# Patient Record
Sex: Male | Born: 1935 | Race: White | Hispanic: No | Marital: Married | Smoking: Former smoker
Health system: Southern US, Community
[De-identification: ages and names within clinical notes are randomized; demographics above are authoritative.]

## PROBLEM LIST (undated history)

## (undated) DIAGNOSIS — K219 Gastro-esophageal reflux disease without esophagitis: Secondary | ICD-10-CM

## (undated) DIAGNOSIS — M169 Osteoarthritis of hip, unspecified: Secondary | ICD-10-CM

## (undated) DIAGNOSIS — I10 Essential (primary) hypertension: Secondary | ICD-10-CM

## (undated) DIAGNOSIS — E785 Hyperlipidemia, unspecified: Secondary | ICD-10-CM

## (undated) DIAGNOSIS — H3530 Unspecified macular degeneration: Secondary | ICD-10-CM

## (undated) HISTORY — DX: Gastro-esophageal reflux disease without esophagitis: K21.9

## (undated) HISTORY — DX: Hyperlipidemia, unspecified: E78.5

## (undated) HISTORY — DX: Essential (primary) hypertension: I10

## (undated) HISTORY — DX: Osteoarthritis of hip, unspecified: M16.9

## (undated) HISTORY — DX: Unspecified macular degeneration: H35.30

## (undated) NOTE — *Deleted (*Deleted)
Depression screen [facility] 2/9 06/06/2020 01/02/2020 12/01/2019  Decreased Interest 1 3 0  Down, Depressed, Hopeless 1 3 1   PHQ - 2 Score 2 6 1   Altered sleeping 0 0 0  Tired, decreased energy 0 1 0  Change in appetite 0 0 0  Feeling bad or failure about yourself  0 0 0  Trouble concentrating 0 0 0  Moving slowly or fidgety/restless 0 0 0  Suicidal thoughts 0 1 0  PHQ-9 Score 2 8 1   Difficult doing work/chores - Somewhat difficult Not difficult at all  Some recent data might be hidden

---

## 1986-08-25 HISTORY — PX: LAMINECTOMY: SHX219

## 2002-01-12 ENCOUNTER — Encounter: Payer: Self-pay | Admitting: Internal Medicine

## 2002-08-25 ENCOUNTER — Encounter: Payer: Self-pay | Admitting: Internal Medicine

## 2002-08-25 LAB — CONVERTED CEMR LAB

## 2003-12-11 ENCOUNTER — Ambulatory Visit (HOSPITAL_COMMUNITY): Admission: RE | Admit: 2003-12-11 | Discharge: 2003-12-11 | Payer: Self-pay | Admitting: Internal Medicine

## 2004-07-29 ENCOUNTER — Ambulatory Visit: Payer: Self-pay | Admitting: Internal Medicine

## 2004-08-05 ENCOUNTER — Ambulatory Visit: Payer: Self-pay | Admitting: Internal Medicine

## 2004-12-19 ENCOUNTER — Ambulatory Visit: Payer: Self-pay | Admitting: Internal Medicine

## 2005-01-01 ENCOUNTER — Ambulatory Visit: Payer: Self-pay | Admitting: Internal Medicine

## 2005-01-17 ENCOUNTER — Ambulatory Visit: Payer: Self-pay | Admitting: Internal Medicine

## 2005-03-07 ENCOUNTER — Ambulatory Visit: Payer: Self-pay | Admitting: Internal Medicine

## 2005-05-01 ENCOUNTER — Ambulatory Visit: Payer: Self-pay | Admitting: Internal Medicine

## 2005-05-08 ENCOUNTER — Ambulatory Visit: Payer: Self-pay | Admitting: Internal Medicine

## 2005-06-03 ENCOUNTER — Ambulatory Visit: Payer: Self-pay | Admitting: Gastroenterology

## 2005-06-18 ENCOUNTER — Ambulatory Visit: Payer: Self-pay | Admitting: Gastroenterology

## 2005-06-18 ENCOUNTER — Encounter: Payer: Self-pay | Admitting: Internal Medicine

## 2005-09-08 ENCOUNTER — Ambulatory Visit: Payer: Self-pay | Admitting: Internal Medicine

## 2005-09-29 ENCOUNTER — Ambulatory Visit: Payer: Self-pay | Admitting: Internal Medicine

## 2006-03-10 ENCOUNTER — Ambulatory Visit: Payer: Self-pay | Admitting: Internal Medicine

## 2006-03-24 ENCOUNTER — Ambulatory Visit: Payer: Self-pay | Admitting: Internal Medicine

## 2006-04-20 ENCOUNTER — Ambulatory Visit (HOSPITAL_COMMUNITY): Admission: RE | Admit: 2006-04-20 | Discharge: 2006-04-20 | Payer: Self-pay | Admitting: Ophthalmology

## 2006-06-08 ENCOUNTER — Ambulatory Visit: Payer: Self-pay | Admitting: Internal Medicine

## 2006-09-24 ENCOUNTER — Ambulatory Visit: Payer: Self-pay | Admitting: Internal Medicine

## 2006-09-24 LAB — CONVERTED CEMR LAB
ALT: 15 units/L (ref 0–40)
AST: 23 units/L (ref 0–37)
Albumin: 3.6 g/dL (ref 3.5–5.2)
Alkaline Phosphatase: 59 units/L (ref 39–117)
BUN: 21 mg/dL (ref 6–23)
Bilirubin, Direct: 0.2 mg/dL (ref 0.0–0.3)
CO2: 28 meq/L (ref 19–32)
Calcium: 9 mg/dL (ref 8.4–10.5)
Chloride: 106 meq/L (ref 96–112)
Cholesterol: 177 mg/dL (ref 0–200)
Creatinine, Ser: 1.2 mg/dL (ref 0.4–1.5)
GFR calc Af Amer: 74 mL/min
GFR calc non Af Amer: 61 mL/min
Glucose, Bld: 77 mg/dL (ref 70–99)
HDL: 42.8 mg/dL (ref >39.0)
LDL Cholesterol: 112 mg/dL — ABNORMAL HIGH (ref 0–99)
Potassium: 4.4 meq/L (ref 3.5–5.1)
Sodium: 141 meq/L (ref 135–145)
TSH: 1.85 microintl units/mL (ref 0.35–5.50)
Total Bilirubin: 0.6 mg/dL (ref 0.3–1.2)
Total CHOL/HDL Ratio: 4.1
Total Protein: 6.8 g/dL (ref 6.0–8.3)
Triglycerides: 111 mg/dL (ref 0–149)
VLDL: 22 mg/dL (ref 0–40)

## 2006-10-09 ENCOUNTER — Ambulatory Visit: Payer: Self-pay | Admitting: Internal Medicine

## 2007-01-06 ENCOUNTER — Ambulatory Visit: Payer: Self-pay | Admitting: Internal Medicine

## 2007-01-20 ENCOUNTER — Ambulatory Visit: Payer: Self-pay | Admitting: Internal Medicine

## 2007-02-22 ENCOUNTER — Encounter: Payer: Self-pay | Admitting: Internal Medicine

## 2007-02-22 DIAGNOSIS — K219 Gastro-esophageal reflux disease without esophagitis: Secondary | ICD-10-CM

## 2007-02-22 DIAGNOSIS — E785 Hyperlipidemia, unspecified: Secondary | ICD-10-CM | POA: Insufficient documentation

## 2007-02-22 DIAGNOSIS — H3530 Unspecified macular degeneration: Secondary | ICD-10-CM

## 2007-02-22 HISTORY — DX: Hyperlipidemia, unspecified: E78.5

## 2007-02-22 HISTORY — DX: Gastro-esophageal reflux disease without esophagitis: K21.9

## 2007-02-22 HISTORY — DX: Unspecified macular degeneration: H35.30

## 2007-03-17 ENCOUNTER — Ambulatory Visit: Payer: Self-pay | Admitting: Internal Medicine

## 2007-03-18 LAB — CONVERTED CEMR LAB
ALT: 17 units/L (ref 0–53)
AST: 21 units/L (ref 0–37)
Albumin: 3.9 g/dL (ref 3.5–5.2)
Alkaline Phosphatase: 65 units/L (ref 39–117)
Bilirubin, Direct: 0.1 mg/dL (ref 0.0–0.3)
Cholesterol: 168 mg/dL (ref 0–200)
HDL: 41 mg/dL (ref >39.0)
LDL Cholesterol: 110 mg/dL — ABNORMAL HIGH (ref 0–99)
Total Bilirubin: 0.8 mg/dL (ref 0.3–1.2)
Total CHOL/HDL Ratio: 4.1
Total Protein: 6.8 g/dL (ref 6.0–8.3)
Triglycerides: 83 mg/dL (ref 0–149)
VLDL: 17 mg/dL (ref 0–40)

## 2007-04-29 ENCOUNTER — Telehealth: Payer: Self-pay | Admitting: Internal Medicine

## 2007-06-17 ENCOUNTER — Ambulatory Visit: Payer: Self-pay | Admitting: Internal Medicine

## 2007-06-17 DIAGNOSIS — I10 Essential (primary) hypertension: Secondary | ICD-10-CM

## 2007-06-17 HISTORY — DX: Essential (primary) hypertension: I10

## 2007-06-18 LAB — CONVERTED CEMR LAB
ALT: 13 units/L (ref 0–53)
AST: 19 units/L (ref 0–37)
Albumin: 3.7 g/dL (ref 3.5–5.2)
Alkaline Phosphatase: 59 units/L (ref 39–117)
BUN: 19 mg/dL (ref 6–23)
Bilirubin, Direct: 0.2 mg/dL (ref 0.0–0.3)
CO2: 29 meq/L (ref 19–32)
Calcium: 9 mg/dL (ref 8.4–10.5)
Chloride: 109 meq/L (ref 96–112)
Cholesterol: 158 mg/dL (ref 0–200)
Creatinine, Ser: 1.2 mg/dL (ref 0.4–1.5)
GFR calc Af Amer: 74 mL/min
GFR calc non Af Amer: 61 mL/min
Glucose, Bld: 81 mg/dL (ref 70–99)
HDL: 38.2 mg/dL — ABNORMAL LOW (ref >39.0)
LDL Cholesterol: 98 mg/dL (ref 0–99)
Potassium: 4.6 meq/L (ref 3.5–5.1)
Sodium: 142 meq/L (ref 135–145)
Total Bilirubin: 0.7 mg/dL (ref 0.3–1.2)
Total CHOL/HDL Ratio: 4.1
Total Protein: 6.6 g/dL (ref 6.0–8.3)
Triglycerides: 111 mg/dL (ref 0–149)
VLDL: 22 mg/dL (ref 0–40)

## 2007-10-18 ENCOUNTER — Ambulatory Visit: Payer: Self-pay | Admitting: Internal Medicine

## 2007-11-15 ENCOUNTER — Ambulatory Visit: Payer: Self-pay | Admitting: Internal Medicine

## 2008-04-18 ENCOUNTER — Ambulatory Visit: Payer: Self-pay | Admitting: Internal Medicine

## 2008-05-26 ENCOUNTER — Ambulatory Visit: Payer: Self-pay | Admitting: Internal Medicine

## 2008-05-26 DIAGNOSIS — C4490 Unspecified malignant neoplasm of skin, unspecified: Secondary | ICD-10-CM

## 2008-06-27 ENCOUNTER — Ambulatory Visit: Payer: Self-pay | Admitting: Internal Medicine

## 2008-07-14 ENCOUNTER — Telehealth: Payer: Self-pay | Admitting: *Deleted

## 2008-12-12 ENCOUNTER — Telehealth: Payer: Self-pay | Admitting: Internal Medicine

## 2008-12-21 ENCOUNTER — Ambulatory Visit: Payer: Self-pay | Admitting: Internal Medicine

## 2009-02-05 ENCOUNTER — Ambulatory Visit: Payer: Self-pay | Admitting: Internal Medicine

## 2009-02-07 LAB — CONVERTED CEMR LAB
ALT: 14 units/L (ref 0–53)
Chloride: 112 meq/L (ref 96–112)
GFR calc non Af Amer: 50.91 mL/min (ref >60)
Glucose, Bld: 87 mg/dL (ref 70–99)
HDL: 45.7 mg/dL (ref >39.00)
LDL Cholesterol: 85 mg/dL (ref 0–99)
Potassium: 4.5 meq/L (ref 3.5–5.1)
Sodium: 143 meq/L (ref 135–145)
TSH: 1.63 microintl units/mL (ref 0.35–5.50)
Total Bilirubin: 0.9 mg/dL (ref 0.3–1.2)
Total Protein: 6.5 g/dL (ref 6.0–8.3)
VLDL: 18.4 mg/dL (ref 0.0–40.0)

## 2009-02-14 ENCOUNTER — Telehealth: Payer: Self-pay | Admitting: Internal Medicine

## 2009-03-20 ENCOUNTER — Ambulatory Visit: Payer: Self-pay | Admitting: Internal Medicine

## 2009-04-27 ENCOUNTER — Ambulatory Visit: Payer: Self-pay | Admitting: Internal Medicine

## 2009-06-05 ENCOUNTER — Ambulatory Visit: Payer: Self-pay | Admitting: Internal Medicine

## 2009-06-05 DIAGNOSIS — M169 Osteoarthritis of hip, unspecified: Secondary | ICD-10-CM

## 2009-06-05 DIAGNOSIS — M1990 Unspecified osteoarthritis, unspecified site: Secondary | ICD-10-CM | POA: Insufficient documentation

## 2009-06-05 DIAGNOSIS — M1610 Unilateral primary osteoarthritis, unspecified hip: Secondary | ICD-10-CM

## 2009-06-05 HISTORY — DX: Unilateral primary osteoarthritis, unspecified hip: M16.10

## 2009-06-05 HISTORY — DX: Osteoarthritis of hip, unspecified: M16.9

## 2009-06-08 ENCOUNTER — Encounter: Admission: RE | Admit: 2009-06-08 | Discharge: 2009-06-08 | Payer: Self-pay | Admitting: Internal Medicine

## 2009-06-19 ENCOUNTER — Ambulatory Visit: Payer: Self-pay | Admitting: Internal Medicine

## 2009-07-24 ENCOUNTER — Telehealth: Payer: Self-pay | Admitting: Internal Medicine

## 2009-08-10 ENCOUNTER — Telehealth: Payer: Self-pay | Admitting: Internal Medicine

## 2009-08-13 ENCOUNTER — Ambulatory Visit: Payer: Self-pay | Admitting: Internal Medicine

## 2009-09-14 ENCOUNTER — Ambulatory Visit: Payer: Self-pay | Admitting: Internal Medicine

## 2009-10-12 ENCOUNTER — Ambulatory Visit: Payer: Self-pay | Admitting: Internal Medicine

## 2009-10-16 LAB — CONVERTED CEMR LAB
ALT: 20 units/L (ref 0–53)
AST: 24 units/L (ref 0–37)
Albumin: 3.9 g/dL (ref 3.5–5.2)
CO2: 28 meq/L (ref 19–32)
Calcium: 9.2 mg/dL (ref 8.4–10.5)
GFR calc non Af Amer: 46.94 mL/min (ref >60)
HDL: 54.1 mg/dL (ref >39.00)
Sodium: 139 meq/L (ref 135–145)
TSH: 1.61 microintl units/mL (ref 0.35–5.50)
Triglycerides: 118 mg/dL (ref 0.0–149.0)

## 2010-01-01 ENCOUNTER — Ambulatory Visit: Payer: Self-pay | Admitting: Internal Medicine

## 2010-01-02 ENCOUNTER — Ambulatory Visit: Payer: Self-pay | Admitting: Internal Medicine

## 2010-01-29 ENCOUNTER — Ambulatory Visit: Payer: Self-pay | Admitting: Internal Medicine

## 2010-03-14 ENCOUNTER — Ambulatory Visit: Payer: Self-pay | Admitting: Internal Medicine

## 2010-03-18 ENCOUNTER — Telehealth: Payer: Self-pay | Admitting: Internal Medicine

## 2010-03-19 ENCOUNTER — Encounter: Admission: RE | Admit: 2010-03-19 | Discharge: 2010-03-19 | Payer: Self-pay | Admitting: Internal Medicine

## 2010-03-20 ENCOUNTER — Ambulatory Visit: Payer: Self-pay | Admitting: Internal Medicine

## 2010-04-02 ENCOUNTER — Ambulatory Visit: Payer: Self-pay | Admitting: Internal Medicine

## 2010-05-06 ENCOUNTER — Ambulatory Visit: Payer: Self-pay | Admitting: Internal Medicine

## 2010-05-10 ENCOUNTER — Ambulatory Visit: Payer: Self-pay | Admitting: Family Medicine

## 2010-05-13 ENCOUNTER — Telehealth: Payer: Self-pay | Admitting: Family Medicine

## 2010-05-21 ENCOUNTER — Ambulatory Visit: Payer: Self-pay | Admitting: Internal Medicine

## 2010-06-04 ENCOUNTER — Ambulatory Visit: Payer: Self-pay | Admitting: Internal Medicine

## 2010-06-05 LAB — CONVERTED CEMR LAB
AST: 21 units/L (ref 0–37)
Alkaline Phosphatase: 57 units/L (ref 39–117)
BUN: 34 mg/dL — ABNORMAL HIGH (ref 6–23)
Bilirubin, Direct: 0.1 mg/dL (ref 0.0–0.3)
CO2: 27 meq/L (ref 19–32)
Chloride: 105 meq/L (ref 96–112)
Creatinine, Ser: 1.3 mg/dL (ref 0.4–1.5)
TSH: 0.99 microintl units/mL (ref 0.35–5.50)
Total Bilirubin: 0.7 mg/dL (ref 0.3–1.2)
Total CHOL/HDL Ratio: 4

## 2010-06-06 ENCOUNTER — Encounter: Admission: RE | Admit: 2010-06-06 | Discharge: 2010-06-06 | Payer: Self-pay | Admitting: Internal Medicine

## 2010-08-05 ENCOUNTER — Ambulatory Visit: Payer: Self-pay | Admitting: Internal Medicine

## 2010-08-05 LAB — CONVERTED CEMR LAB: LDL Goal: 100 mg/dL

## 2010-09-23 ENCOUNTER — Ambulatory Visit
Admission: RE | Admit: 2010-09-23 | Discharge: 2010-09-23 | Payer: Self-pay | Source: Home / Self Care | Attending: Internal Medicine | Admitting: Internal Medicine

## 2010-09-26 NOTE — Assessment & Plan Note (Signed)
Summary: SHINGLES? // RS   Vital Signs:  Patient profile:   77 year old male Weight:      179 pounds BMI:     27.32 Temp:     98.3 degrees F oral Pulse rate:   60 / minute Pulse rhythm:   regular Resp:     12 per minute BP sitting:   132 / 76  (left arm) Cuff size:   regular  Vitals Entered By: Gladis Riffle, RN (Jan 01, 2010 9:20 AM) CC: c/o shhingles lingering, is out of cream--also right knee swelling Is Patient Diabetic? No   CC:  c/o shhingles lingering and is out of cream--also right knee swelling.  History of Present Illness: pt concerned with shingles he describes a few bumps on lower legs (bilateral). Seem to come and go and are pruritic. no fever or chills, no sweats  no new contacts---see previous note regarding rash he wonders whether related to BP med changes  All other systems reviewed and were negative   Preventive Screening-Counseling & Management  Alcohol-Tobacco     Smoking Status: current     Year Quit: X 35 YEARS     Cigars/week: one half/week  Current Problems (verified): 1)  Osteoarthritis, Hip, Right  (ICD-715.95) 2)  Carcinoma, Skin, Squamous Cell  (ICD-173.9) 3)  Hypertension  (ICD-401.9) 4)  Macular Degeneration  (ICD-362.50) 5)  Hyperlipidemia  (ICD-272.4) 6)  Gerd  (ICD-530.81)  Current Medications (verified): 1)  Ecotrin 325 Mg Tbec (Aspirin) .... Take 1 Once A Day 2)  Lovastatin 40 Mg Tabs (Lovastatin) .... Take 1 Tablet By Mouth Once A Day 3)  Ocuvite  Tabs (Multiple Vitamins-Minerals) .... Take 1 Tablet By Mouth Once A Day 4)  Fluticasone Propionate 50 Mcg/act  Susp (Fluticasone Propionate) .... 2 Sprays Each Nostril Once Daily 5)  Benazepril-Hydrochlorothiazide 20-25 Mg  Tabs (Benazepril-Hydrochlorothiazide) .[name].. 1 Tab By Mouth Daily 6)  Triamcinolone Acetonide 0.5 % Crea (Triamcinolone Acetonide) .... Apply Two Times A Day To Affected Area 7)  Multivitamins  Tabs (Multiple Vitamin) .... Once Daily  Allergies: 1)  ! Amlodipine  Besylate (Amlodipine Besylate) 2)  Aspirin (Aspirin)  Past History:  Past Medical History: Last updated: 06/17/2007 Allergic rhinitis GERD Hyperlipidemia knee pain macular degeneration  2004 Osteoarthritis Hypertension  Past Surgical History: Last updated: 02/22/2007 laminectomy  1988  Family History: Last updated: 10/18/2007 Family History Breast cancer 1st degree relative <50-daughter  Social History: Last updated: 03/17/2007 Married Former Smoker  Risk Factors: Smoking Status: current (01/01/2010) Cigars/wk: one half/week (01/01/2010)  Physical Exam  General:  Well-developed,well-nourished,in no acute distress; alert,appropriate and cooperative throughout examination Head:  normocephalic and atraumatic.   Eyes:  pupils equal and pupils round.   Ears:  R ear normal and L ear normal.   Neck:  No deformities, masses, or tenderness noted. Chest Wall:  No deformities, masses, tenderness or gynecomastia noted. Lungs:  normal respiratory effort and no intercostal retractions.   Heart:  normal rate and regular rhythm.   Abdomen:  soft and non-tender.   Msk:  No deformity or scoliosis noted of thoracic or lumbar spine.   Extremities:  No clubbing, cyanosis, edema, or deformity noted  Neurologic:  cranial nerves II-XII intact and gait antalgic     Impression & Recommendations:  Problem # 1:  HYPERTENSION (ICD-401.9) unclear whether rash is related to meds will change back to micardis The following medications were removed from the medication list:    Benazepril-hydrochlorothiazide 20-25 Mg Tabs (Benazepril-hydrochlorothiazide) .[name]... 1 tab by mouth  daily His updated medication list for this problem includes:    Micardis 80 Mg Tabs (Telmisartan) .[name]... 1/2  by mouth daily  Problem # 2:  RASH-NONVESICULAR (ICD-782.1) ? related to meds change to Micardis His updated medication list for this problem includes:    Triamcinolone Acetonide 0.5 % Crea (Triamcinolone  acetonide) .[name]... Apply two times a day to affected area  Problem # 3:  OSTEOARTHRITIS, HIP, RIGHT (ICD-715.95) OA knee kne injection later this week His updated medication list for this problem includes:    Ecotrin 325 Mg Tbec (Aspirin) .[name]... Take 1 once a day  Complete Medication List: 1)  Ecotrin 325 Mg Tbec (Aspirin) .... Take 1 once a day 2)  Lovastatin 40 Mg Tabs (Lovastatin) .... Take 1 tablet by mouth once a day 3)  Ocuvite Tabs (Multiple vitamins-minerals) .... Take 1 tablet by mouth once a day 4)  Fluticasone Propionate 50 Mcg/act Susp (Fluticasone propionate) .... 2 sprays each nostril once daily 5)  Triamcinolone Acetonide 0.5 % Crea (Triamcinolone acetonide) .... Apply two times a day to affected area 6)  Multivitamins Tabs (Multiple vitamin) .... Once daily 7)  Micardis 80 Mg Tabs (Telmisartan) .... 1/2  by mouth daily  Patient Instructions: 1)  2 appts: 2)  this week for knee injection 3)  1 month f/u BP

## 2010-09-26 NOTE — Progress Notes (Signed)
Summary: micardis concerns  Phone Note Call from Patient   Caller: Spouse Reason for Call: Talk to Doctor Summary of Call: patient's wife is calling because her husband is taking micardis 80mg .  He is "wobbly" and light-headed.  she would like to know if he should go back to micardis 40mg . Initial call taken by: Kern Reap CMA Duncan Dull),  May 13, 2010 2:13 PM  Follow-up for Phone Call        Have they checked his BP to see if this has dropped too low? Follow-up by: Evelena Peat MD,  May 13, 2010 6:05 PM  Additional Follow-up for Phone Call Additional follow up Details #1::        BPs are running 158/100 and 160/97, but is still dizzy, and unsteady on his feet.  Wife has cut him back to 40 mg. Micardis? Additional Follow-up by: Lynann Beaver CMA,  May 14, 2010 9:49 AM    Additional Follow-up for Phone Call Additional follow up Details #2::    based on his blood pressures, he needs to be on the full tablet dose. Follow-up by: Evelena Peat MD,  May 14, 2010 1:01 PM  Additional Follow-up for Phone Call Additional follow up Details #3:: Details for Additional Follow-up Action Taken: Pta wife called back wanting to know status. Pls call back asap.  [phone]     I spoke with wife, he took 1/2 tab last night, BP was 158/88, no C/O dizziness last night or this AM.  BP at 8:45 was 145/94.  Can they begin taking the Micardis 1/2 tab two times a day? Sid Falcon LPN  May 15, 2010 11:05 AM YES Evelena Peat MD  May 15, 2010 12:57 PM Wife informed and also asked her to keep a log of his BP and how he is feeling.  F/U as needed Sid Falcon LPN  May 15, 2010 5:19 PM   pt wife callback waiting for nurse to return call Heron Sabins  May 15, 2010 3:52 PM  Additional Follow-up by: Lucy Antigua,  May 14, 2010 2:35 PM

## 2010-09-26 NOTE — Assessment & Plan Note (Signed)
Summary: pt will come in fasting/njr   Vital Signs:  Patient profile:   77 year old male Weight:      179 pounds Temp:     98.2 degrees F oral Resp:     16 per minute BP sitting:   158 / 98  Vitals Entered By: Lynann Beaver CMA (June 04, 2010 11:20 AM) CC: rov Is Patient Diabetic? No Pain Assessment Patient in pain? no        CC:  rov.  History of Present Illness:  Follow-Up Visit      This is an 77 year old man who presents for Follow-up visit.  The patient denies chest pain and palpitations.  Since the last visit the patient notes no new problems or concerns.  The patient reports taking meds as prescribed and monitoring BP.  When questioned about possible medication side effects, the patient notes none.  Home BPs 117-157/60-90  he feels well except struggles with orthopedic complaints: knee, hip, back pain. Hip pain is the worst  All other systems reviewed and were negative -swelling of LE resolved with medication change.   Current Problems (verified): 1)  Osteoarthritis, Hip, Right  (ICD-715.95) 2)  Carcinoma, Skin, Squamous Cell  (ICD-173.9) 3)  Hypertension  (ICD-401.9) 4)  Macular Degeneration  (ICD-362.50) 5)  Hyperlipidemia  (ICD-272.4) 6)  Gerd  (ICD-530.81)  Current Medications (verified): 1)  Ecotrin 325 Mg Tbec (Aspirin) .... Take 1 Once A Day 2)  Lovastatin 40 Mg Tabs (Lovastatin) .... Take 1 Tablet By Mouth Once A Day 3)  Ocuvite  Tabs (Multiple Vitamins-Minerals) .... Take 1 Tablet By Mouth Once A Day 4)  Triamcinolone Acetonide 0.5 % Crea (Triamcinolone Acetonide) .... Apply Two Times A Day To Affected Area 5)  Multivitamins  Tabs (Multiple Vitamin) .... Once Daily 6)  Micardis 80 Mg Tabs (Telmisartan) .... 1/2 By Mouth Two Times A Day 7)  Hydrochlorothiazide 25 Mg  Tabs (Hydrochlorothiazide) .... Take 1 Tab By Mouth Every Morning  Allergies (verified): 1)  ! Amlodipine Besylate (Amlodipine Besylate) 2)  Aspirin (Aspirin)  Past History:  Past  Medical History: Last updated: 06/17/2007 Allergic rhinitis GERD Hyperlipidemia knee pain macular degeneration  2004 Osteoarthritis Hypertension  Past Surgical History: Last updated: 02/22/2007 laminectomy  1988  Family History: Last updated: 10/18/2007 Family History Breast cancer 1st degree relative <50-daughter  Social History: Last updated: 03/17/2007 Married Former Smoker  Risk Factors: Smoking Status: current (04/02/2010) Cigars/wk: one half/week (04/02/2010)  Physical Exam  General:  alert and well-developed.   Head:  normocephalic and atraumatic.   Eyes:  pupils equal and pupils round.   Neck:  supple and full ROM.   Chest Wall:  No deformities, masses, tenderness or gynecomastia noted. Lungs:  normal respiratory effort and no intercostal retractions.   Abdomen:  soft and non-tender.   Neurologic:  broad based gait Skin:  turgor normal and color normal.   Psych:  good eye contact and not anxious appearing.     Impression & Recommendations:  Problem # 1:  OSTEOARTHRITIS, HIP, RIGHT (ICD-715.95)  has had injections---still with pain he admits tylenol helps significantly I think he will do better with injection. We'll schedule. His updated medication list for this problem includes:    Ecotrin 325 Mg Tbec (Aspirin) .[name]... Take 1 once a day  Orders: Radiology Referral (Radiology)  Problem # 2:  HYPERTENSION (ICD-401.9)  note home bps repeat bp 148/80 His updated medication list for this problem includes:    Micardis 80 Mg Tabs (  Telmisartan) .[name]... 1/2 by mouth two times a day    Hydrochlorothiazide 25 Mg Tabs (Hydrochlorothiazide) .[name]... Take 1 tab by mouth every morning  BP today: 158/98 Prior BP: 152/98 (05/21/2010)  Prior 10 Yr Risk Heart Disease: 27 % (05/10/2010)  Labs Reviewed: K+: 4.4 (10/12/2009) Creat: : 1.5 (10/12/2009)   Chol: 162 (10/12/2009)   HDL: 54.10 (10/12/2009)   LDL: 84 (10/12/2009)   TG: 118.0 (10/12/2009)  Orders: Specimen  Handling (57846) TLB-BMP (Basic Metabolic Panel-BMET) (80048-METABOL)  Problem # 3:  HYPERLIPIDEMIA (ICD-272.4)  adequate control His updated medication list for this problem includes:    Lovastatin 40 Mg Tabs (Lovastatin) .[name]... Take 1 tablet by mouth once a day  Labs Reviewed: SGOT: 24 (10/12/2009)   SGPT: 20 (10/12/2009)  Prior 10 Yr Risk Heart Disease: 27 % (05/10/2010)   HDL:54.10 (10/12/2009), 45.70 (02/05/2009)  LDL:84 (10/12/2009), 85 (02/05/2009)  Chol:162 (10/12/2009), 149 (02/05/2009)  Trig:118.0 (10/12/2009), 92.0 (02/05/2009)  Orders: Venipuncture (96295) Specimen Handling (28413) TLB-Lipid Panel (80061-LIPID) TLB-Hepatic/Liver Function Pnl (80076-HEPATIC) TLB-TSH (Thyroid Stimulating Hormone) (84443-TSH)  Complete Medication List: 1)  Ecotrin 325 Mg Tbec (Aspirin) .... Take 1 once a day 2)  Lovastatin 40 Mg Tabs (Lovastatin) .... Take 1 tablet by mouth once a day 3)  Ocuvite Tabs (Multiple vitamins-minerals) .... Take 1 tablet by mouth once a day 4)  Triamcinolone Acetonide 0.5 % Crea (Triamcinolone acetonide) .... Apply two times a day to affected area 5)  Multivitamins Tabs (Multiple vitamin) .... Once daily 6)  Micardis 80 Mg Tabs (Telmisartan) .... 1/2 by mouth two times a day 7)  Hydrochlorothiazide 25 Mg Tabs (Hydrochlorothiazide) .... Take 1 tab by mouth every morning  Patient Instructions: 1)  Please schedule a follow-up appointment in 2 months.     Prevention & Chronic Care Immunizations   Influenza vaccine: Fluvax 3+  (05/06/2010)    Tetanus booster: 02/05/2009: Td    Pneumococcal vaccine: Pneumovax (Medicare)  (06/17/2007)    H. zoster vaccine: Not documented  Colorectal Screening   Hemoccult: Not documented    Colonoscopy: done  (06/25/2005)   Colonoscopy action/deferral: not indicated  (04/02/2010)  Other Screening   PSA: done  (08/25/2002)   Smoking status: current  (04/02/2010)  Lipids   Total Cholesterol: 162  (10/12/2009)    LDL: 84  (10/12/2009)   LDL Direct: Not documented   HDL: 54.10  (10/12/2009)   Triglycerides: 118.0  (10/12/2009)    SGOT (AST): 24  (10/12/2009)   SGPT (ALT): 20  (10/12/2009)   Alkaline phosphatase: 51  (10/12/2009)   Total bilirubin: 0.6  (10/12/2009)  Hypertension   Last Blood Pressure: 158 / 98  (06/04/2010)   Serum creatinine: 1.5  (10/12/2009)   Serum potassium 4.4  (10/12/2009)  Self-Management Support :    Hypertension self-management support: Not documented    Lipid self-management support: Not documented

## 2010-09-26 NOTE — Assessment & Plan Note (Signed)
Summary: leg pain//ccm   Vital Signs:  Patient profile:   77 year old male BP sitting:   138 / 80  History of Present Illness: Right hip pain---reviewed previous imaging and injection pain has gradually worsened pain is significant with any walking no swelling All other systems reviewed and were negative   Current Problems (verified): 1)  Knee Pain  (ICD-719.46) 2)  Rash-nonvesicular  (ICD-782.1) 3)  Osteoarthritis, Hip, Right  (ICD-715.95) 4)  Carcinoma, Skin, Squamous Cell  (ICD-173.9) 5)  Hypertension  (ICD-401.9) 6)  Macular Degeneration  (ICD-362.50) 7)  Hyperlipidemia  (ICD-272.4) 8)  Gerd  (ICD-530.81)  Current Medications (verified): 1)  Ecotrin 325 Mg Tbec (Aspirin) .... Take 1 Once A Day 2)  Lovastatin 40 Mg Tabs (Lovastatin) .... Take 1 Tablet By Mouth Once A Day 3)  Ocuvite  Tabs (Multiple Vitamins-Minerals) .... Take 1 Tablet By Mouth Once A Day 4)  Triamcinolone Acetonide 0.5 % Crea (Triamcinolone Acetonide) .... Apply Two Times A Day To Affected Area 5)  Multivitamins  Tabs (Multiple Vitamin) .... Once Daily 6)  Micardis 80 Mg Tabs (Telmisartan) .... 1/2  By Mouth Daily  Allergies (verified): 1)  ! Amlodipine Besylate (Amlodipine Besylate) 2)  Aspirin (Aspirin)  Past History:  Past Medical History: Last updated: 06/17/2007 Allergic rhinitis GERD Hyperlipidemia knee pain macular degeneration  2004 Osteoarthritis Hypertension  Past Surgical History: Last updated: 02/22/2007 laminectomy  1988  Family History: Last updated: 10/18/2007 Family History Breast cancer 1st degree relative <50-daughter  Social History: Last updated: 03/17/2007 Married Former Smoker  Risk Factors: Smoking Status: current (01/29/2010) Cigars/wk: one half/week (01/29/2010)  Physical Exam  General:  alert and well-developed.   Head:  normocephalic and atraumatic.   Eyes:  pupils equal and pupils round.   Neck:  No deformities, masses, or tenderness noted. Chest  Wall:  No deformities, masses, tenderness or gynecomastia noted. Msk:  pain with internal and external rotation of hip   Impression & Recommendations:  Problem # 1:  OSTEOARTHRITIS, HIP, RIGHT (ICD-715.95) will repeat injection as he had great results previously His updated medication list for this problem includes:    Ecotrin 325 Mg Tbec (Aspirin) .[name]... Take 1 once a day  Orders: Radiology Referral (Radiology)  Complete Medication List: 1)  Ecotrin 325 Mg Tbec (Aspirin) .... Take 1 once a day 2)  Lovastatin 40 Mg Tabs (Lovastatin) .... Take 1 tablet by mouth once a day 3)  Ocuvite Tabs (Multiple vitamins-minerals) .... Take 1 tablet by mouth once a day 4)  Triamcinolone Acetonide 0.5 % Crea (Triamcinolone acetonide) .... Apply two times a day to affected area 5)  Multivitamins Tabs (Multiple vitamin) .... Once daily 6)  Micardis 80 Mg Tabs (Telmisartan) .... 1/2  by mouth daily

## 2010-09-26 NOTE — Assessment & Plan Note (Signed)
Summary: 2 month follow up/cjr   Vital Signs:  Patient profile:   77 year old male Weight:      183 pounds Temp:     98.0 degrees F oral Pulse rate:   72 / minute Pulse rhythm:   regular BP sitting:   152 / 88  (left arm) Cuff size:   large  Vitals Entered By: Alfred Levins, CMA (August 05, 2010 10:40 AM) CC: f/u on bp, Lipid Management   CC:  f/u on bp and Lipid Management.  History of Present Illness:  Follow-Up Visit      This is an 77 year old man who presents for Follow-up visit.  The patient denies chest pain and palpitations.  Since the last visit the patient notes no new problems or concerns.  The patient reports taking meds as prescribed and monitoring BP.  When questioned about possible medication side effects, the patient notes none.  Home BPs: 120s/70s  All other systems reviewed and were negative except for usual MSK complaints (knees, hips). Also right hip pain especially with walking. no pain with sitting, lying.     Lipid Management History:      Positive NCEP/ATP III risk factors include male age 70 years old or older, current tobacco user, and hypertension.    Current Medications (verified): 1)  Ecotrin 325 Mg Tbec (Aspirin) .... Take 1 Once A Day 2)  Lovastatin 40 Mg Tabs (Lovastatin) .... Take 1 Tablet By Mouth Once A Day 3)  Micardis 80 Mg Tabs (Telmisartan) .... 1/2 By Mouth Two Times A Day 4)  Hydrochlorothiazide 25 Mg  Tabs (Hydrochlorothiazide) .... Take 1 Tab By Mouth Every Morning  Allergies (verified): 1)  ! Amlodipine Besylate (Amlodipine Besylate) 2)  Aspirin (Aspirin)  Past History:  Past Medical History: Last updated: 06/17/2007 Allergic rhinitis GERD Hyperlipidemia knee pain macular degeneration  2004 Osteoarthritis Hypertension  Past Surgical History: Last updated: 02/22/2007 laminectomy  1988  Family History: Last updated: 10/18/2007 Family History Breast cancer 1st degree relative <50-daughter  Social History: Last  updated: 03/17/2007 Married Former Smoker  Risk Factors: Smoking Status: current (04/02/2010) Cigars/wk: one half/week (04/02/2010)  Physical Exam  General:  well-developed elderly male in no acute distress. He walks with a broad-based gait is somewhat antalgic. HEENT exam atraumatic normocephalic, neck supple. Chest clear to auscultation cardiac exam S1-S2 are regular. Abdominal exam active bowel sounds, soft. Extremities no clubbing or cyanosis. No edema.   Impression & Recommendations:  Problem # 1:  HYPERTENSION (ICD-401.9) home BPs are adequate---coninue meds.  His updated medication list for this problem includes:    Micardis 80 Mg Tabs (Telmisartan) .[name]... 1/2 by mouth two times a day    Hydrochlorothiazide 25 Mg Tabs (Hydrochlorothiazide) .[name]... Take 1 tab by mouth every morning  BP today: 152/88 Prior BP: 158/98 (06/04/2010)  Prior 10 Yr Risk Heart Disease: 27 % (05/10/2010)  Labs Reviewed: K+: 4.7 (06/04/2010) Creat: : 1.3 (06/04/2010)   Chol: 176 (06/04/2010)   HDL: 46.00 (06/04/2010)   LDL: 108 (06/04/2010)   TG: 109.0 (06/04/2010)  Problem # 2:  OSTEOARTHRITIS, HIP, RIGHT (ICD-715.95) reviewed xray I suspect this is the cause of the pain His updated medication list for this problem includes:    Ecotrin 325 Mg Tbec (Aspirin) .[name]... Take 1 once a day  Problem # 3:  HYPERLIPIDEMIA (ICD-272.4)  His updated medication list for this problem includes:    Lovastatin 40 Mg Tabs (Lovastatin) .[name]... Take 1 tablet by mouth once a day  Labs  Reviewed: SGOT: 21 (06/04/2010)   SGPT: 16 (06/04/2010)  Lipid Goals: Chol Goal: 200 (08/05/2010)   HDL Goal: 40 (08/05/2010)   LDL Goal: 100 (08/05/2010)   TG Goal: 150 (08/05/2010)  10 Yr Risk Heart Disease: 40 % Prior 10 Yr Risk Heart Disease: 27 % (05/10/2010)   HDL:46.00 (06/04/2010), 54.10 (10/12/2009)  LDL:108 (06/04/2010), 84 (45/40/9811)  Chol:176 (06/04/2010), 162 (10/12/2009)  Trig:109.0 (06/04/2010), 118.0  (10/12/2009)  Complete Medication List: 1)  Ecotrin 325 Mg Tbec (Aspirin) .... Take 1 once a day 2)  Lovastatin 40 Mg Tabs (Lovastatin) .... Take 1 tablet by mouth once a day 3)  Micardis 80 Mg Tabs (Telmisartan) .... 1/2 by mouth two times a day 4)  Hydrochlorothiazide 25 Mg Tabs (Hydrochlorothiazide) .... Take 1 tab by mouth every morning  Lipid Assessment/Plan:      Based on NCEP/ATP III, the patient's risk factor category is "2 or more risk factors and a calculated 10 year CAD risk of > 20%".  The patient's lipid goals are as follows: Total cholesterol goal is 200; LDL cholesterol goal is 100; HDL cholesterol goal is 40; Triglyceride goal is 150.     Patient Instructions: 1)  see me 6 weeks Prescriptions: MICARDIS 80 MG TABS (TELMISARTAN) 1/2 by mouth two times a day  #30 x 6   Entered and Authorized by:   Birdie Sons MD   Signed by:   Birdie Sons MD on 08/05/2010   Method used:   Electronically to        [facility] [phone]* (retail)       [address]       [place], [place]  [address]       Ph: [phone] or [phone]       Fax: [phone]   RxID:   [id number]    Orders Added: 1)  Est. Patient Level III [38756]

## 2010-09-26 NOTE — Assessment & Plan Note (Signed)
Summary: 1 mo rov/mm   Vital Signs:  Patient profile:   77 year old male Weight:      184 pounds Temp:     97.8 degrees F BP sitting:   144 / 86  Vitals Entered By: Lynann Beaver CMA (September 14, 2009 9:54 AM) CC: Pt is concerned about BP readings. Is Patient Diabetic? No Pain Assessment Patient in pain? no        CC:  Pt is concerned about BP readings..  History of Present Illness:  Follow-Up Visit      This is an 77 year old man who presents for Follow-up visit.  The patient denies chest pain, palpitations, dizziness, syncope, edema, SOB, DOE, PND, and orthopnea.  Since the last visit the patient notes no new problems or concerns.  The patient reports taking meds as prescribed and monitoring BP.  When questioned about possible medication side effects, the patient notes none.    All other systems reviewed and were negative   Current Problems (verified): 1)  Osteoarthritis, Hip, Right  (ICD-715.95) 2)  Carcinoma, Skin, Squamous Cell  (ICD-173.9) 3)  Hypertension  (ICD-401.9) 4)  Macular Degeneration  (ICD-362.50) 5)  Hyperlipidemia  (ICD-272.4) 6)  Gerd  (ICD-530.81)  Allergies (verified): 1)  ! Amlodipine Besylate (Amlodipine Besylate) 2)  Aspirin (Aspirin)  Past History:  Past Medical History: Last updated: 06/17/2007 Allergic rhinitis GERD Hyperlipidemia knee pain macular degeneration  2004 Osteoarthritis Hypertension  Past Surgical History: Last updated: 02/22/2007 laminectomy  1988  Family History: Last updated: 10/18/2007 Family History Breast cancer 1st degree relative <50-daughter  Social History: Last updated: 03/17/2007 Married Former Smoker  Risk Factors: Smoking Status: current (08/13/2009) Cigars/wk: one half/week (08/13/2009)  Review of Systems       All other systems reviewed and were negative   Physical Exam  General:  Well-developed,well-nourished,in no acute distress; alert,appropriate and cooperative throughout  examination Head:  normocephalic and atraumatic.   Eyes:  pupils equal and pupils round.   Ears:  R ear normal and L ear normal.   Neck:  No deformities, masses, or tenderness noted. Chest Wall:  No deformities, masses, tenderness or gynecomastia noted. Lungs:  Normal respiratory effort, chest expands symmetrically. Lungs are clear to auscultation, no crackles or wheezes. Heart:  Normal rate and regular rhythm. S1 and S2 normal without gallop, murmur, click, rub or other extra sounds. Abdomen:  Bowel sounds positive,abdomen soft and non-tender without masses, organomegaly or hernias noted. Msk:  No deformity or scoliosis noted of thoracic or lumbar spine.   Pulses:  R radial normal and L radial normal.  R radial normal and L radial normal.   Neurologic:  cranial nerves II-XII intact and gait normal.   Skin:  turgor normal and color normal.   Psych:  normally interactive and good eye contact.     Impression & Recommendations:  Problem # 1:  OSTEOARTHRITIS, HIP, RIGHT (ICD-715.95)  clinically stable can consider steroid injection His updated medication list for this problem includes:    Ecotrin 325 Mg Tbec (Aspirin) .[name]... Take 1 once a day  Orders: Prescription Created Electronically [phone])  Problem # 2:  HYPERTENSION (ICD-401.9)  not as well controlled see new meds side effects discussed The following medications were removed from the medication list:    Losartan Potassium 50 Mg Tabs (Losartan potassium) .[name]... Take 1 tablet by mouth once a day His updated medication list for this problem includes:    Benazepril-hydrochlorothiazide 20-25 Mg Tabs (Benazepril-hydrochlorothiazide) .[name]... 1 tab by mouth  daily  BP today: 144/86 Prior BP: 138/80 (08/13/2009)  Labs Reviewed: K+: 4.5 (02/05/2009) Creat: : 1.4 (02/05/2009)   Chol: 149 (02/05/2009)   HDL: 45.70 (02/05/2009)   LDL: 85 (02/05/2009)   TG: 92.0 (02/05/2009)  Orders: Prescription Created Electronically [phone])  Problem  # 3:  HYPERLIPIDEMIA (ICD-272.4)  controlled His updated medication list for this problem includes:    Lovastatin 40 Mg Tabs (Lovastatin) .[name]... Take 1 tablet by mouth once a day  Labs Reviewed: SGOT: 20 (02/05/2009)   SGPT: 14 (02/05/2009)   HDL:45.70 (02/05/2009), 38.2 (06/17/2007)  LDL:85 (02/05/2009), 98 (06/17/2007)  Chol:149 (02/05/2009), 158 (06/17/2007)  Trig:92.0 (02/05/2009), 111 (06/17/2007)  Orders: Prescription Created Electronically [phone])  Complete Medication List: 1)  Ecotrin 325 Mg Tbec (Aspirin) .... Take 1 once a day 2)  Lovastatin 40 Mg Tabs (Lovastatin) .... Take 1 tablet by mouth once a day 3)  Ocuvite Tabs (Multiple vitamins-minerals) .... Take 1 tablet by mouth once a day 4)  Fluticasone Propionate 50 Mcg/act Susp (Fluticasone propionate) .... 2 sprays each nostril once daily 5)  Benazepril-hydrochlorothiazide 20-25 Mg Tabs (Benazepril-hydrochlorothiazide) .[name].. 1 tab by mouth daily 6)  Triamcinolone Acetonide 0.5 % Crea (Triamcinolone acetonide) .... Apply two times a day to affected area  Patient Instructions: 1)  Please schedule a follow-up appointment in 1 month. Prescriptions: TRIAMCINOLONE ACETONIDE 0.5 % CREA (TRIAMCINOLONE ACETONIDE) apply two times a day to affected area  #30 grams x PRN   Entered and Authorized by:   Birdie Sons MD   Signed by:   Birdie Sons MD on 09/14/2009   Method used:   Electronically to        [facility] [phone][facility] (retail)       [address]       [place], [place]  [address]       Ph: [phone] or [phone]       Fax: [phone]   RxID:   [id number] BENAZEPRIL-HYDROCHLOROTHIAZIDE 20-25 MG  TABS (BENAZEPRIL-HYDROCHLOROTHIAZIDE) 1 tab by mouth daily  #90 x 3   Entered and Authorized by:   Birdie Sons MD   Signed by:   Birdie Sons MD on 09/14/2009   Method used:   [facility]ronically to        [facility] [phone][facility] (retail)       [address]       [place], [place]  [address]       Ph: [phone] or [phone]        Fax: [phone]   RxID:   [id number]

## 2010-09-26 NOTE — Assessment & Plan Note (Signed)
Summary: FLU SHOT/cb  Flu Vaccine Consent Questions     Do you have a history of severe allergic reactions to this vaccine? no    Any prior history of allergic reactions to egg and/or gelatin? no    Do you have a sensitivity to the preservative Thimersol? no    Do you have a past history of Guillan-Barre Syndrome? no    Do you currently have an acute febrile illness? no    Have you ever had a severe reaction to latex? no    Vaccine information given and explained to patient? yes    Are you currently pregnant? no    Lot Number:[id number]   Exp Date:02/22/2011   Site Given  Right  Deltoid IM  Nurse Visit   Allergies: 1)  ! Amlodipine Besylate (Amlodipine Besylate) 2)  Aspirin (Aspirin)  Orders Added: 1)  Admin 1st Vaccine [90471] 2)  Flu Vaccine 55yrs + [16109]

## 2010-09-26 NOTE — Assessment & Plan Note (Signed)
Summary: 1 MONTH ROV/NJR   Vital Signs:  Patient profile:   77 year old male Height:      68 inches Weight:      184 pounds Temp:     97.7 degrees F Pulse rate:   76 / minute Pulse rhythm:   irregular BP sitting:   150 / 80  (left arm) Cuff size:   regular  Vitals Entered By: Kern Reap CMA Duncan Dull) (October 12, 2009 9:44 AM)  Reason for Visit folllow up blood pressure and rash on left arm  History of Present Illness:  Follow-Up Visit      This is an 77 year old man who presents for Follow-up visit.  The patient denies chest pain and palpitations.  Since the last visit the patient notes no new problems or concerns (except bilateral arm rash last week---resolved now).  The patient reports taking meds as prescribed and monitoring BP  110-140/60-75).  When questioned about possible medication side effects, the patient notes none.    All other systems reviewed and were negative   Current Problems (verified): 1)  Osteoarthritis, Hip, Right  (ICD-715.95) 2)  Carcinoma, Skin, Squamous Cell  (ICD-173.9) 3)  Hypertension  (ICD-401.9) 4)  Macular Degeneration  (ICD-362.50) 5)  Hyperlipidemia  (ICD-272.4) 6)  Gerd  (ICD-530.81)  Current Medications (verified): 1)  Ecotrin 325 Mg Tbec (Aspirin) .... Take 1 Once A Day 2)  Lovastatin 40 Mg Tabs (Lovastatin) .... Take 1 Tablet By Mouth Once A Day 3)  Ocuvite  Tabs (Multiple Vitamins-Minerals) .... Take 1 Tablet By Mouth Once A Day 4)  Fluticasone Propionate 50 Mcg/act  Susp (Fluticasone Propionate) .... 2 Sprays Each Nostril Once Daily 5)  Benazepril-Hydrochlorothiazide 20-25 Mg  Tabs (Benazepril-Hydrochlorothiazide) .[name].. 1 Tab By Mouth Daily 6)  Triamcinolone Acetonide 0.5 % Crea (Triamcinolone Acetonide) .... Apply Two Times A Day To Affected Area 7)  Multivitamins  Tabs (Multiple Vitamin) .... Once Daily  Allergies: 1)  ! Amlodipine Besylate (Amlodipine Besylate) 2)  Aspirin (Aspirin)  Past History:  Past Medical History: Last  updated: 06/17/2007 Allergic rhinitis GERD Hyperlipidemia knee pain macular degeneration  2004 Osteoarthritis Hypertension  Past Surgical History: Last updated: 02/22/2007 laminectomy  1988  Family History: Last updated: 10/18/2007 Family History Breast cancer 1st degree relative <50-daughter  Social History: Last updated: 03/17/2007 Married Former Smoker  Risk Factors: Smoking Status: current (08/13/2009) Cigars/wk: one half/week (08/13/2009)  Physical Exam  General:  Well-developed,well-nourished,in no acute distress; alert,appropriate and cooperative throughout examination Head:  normocephalic and atraumatic.   Eyes:  pupils equal and pupils round.   Neck:  No deformities, masses, or tenderness noted. Chest Wall:  No deformities, masses, tenderness or gynecomastia noted. Lungs:  Normal respiratory effort, chest expands symmetrically. Lungs are clear to auscultation, no crackles or wheezes. Heart:  normal rate and regular rhythm.   Abdomen:  soft and non-tender.   Msk:  No deformity or scoliosis noted of thoracic or lumbar spine.   Pulses:  R radial normal and L radial normal.   Neurologic:  cranial nerves II-XII intact and broad based gait.   Skin:  turgor normal and color normal.   Psych:  normally interactive and good eye contact.     Impression & Recommendations:  Problem # 1:  HYPERTENSION (ICD-401.9)  good control at home-- continue current medications  His updated medication list for this problem includes:    Benazepril-hydrochlorothiazide 20-25 Mg Tabs (Benazepril-hydrochlorothiazide) .[name]... 1 tab by mouth daily  BP today: 150/80 Prior BP: 144/86 (09/14/2009)  Labs Reviewed: K+: 4.5 (02/05/2009) Creat: : 1.4 (02/05/2009)   Chol: 149 (02/05/2009)   HDL: 45.70 (02/05/2009)   LDL: 85 (02/05/2009)   TG: 92.0 (02/05/2009)  Orders: Venipuncture ([address]109) TLB-BMP (Basic Metabolic Panel-BMET) (80048-METABOL)  Problem # 2:  HYPERLIPIDEMIA  (ICD-272.4)  will need labs His updated medication list for this problem includes:    Lovastatin 40 Mg Tabs (Lovastatin) .[name]... Take 1 tablet by mouth once a day  Labs Reviewed: SGOT: 20 (02/05/2009)   SGPT: 14 (02/05/2009)   HDL:45.70 (02/05/2009), 38.2 (06/17/2007)  LDL:85 (02/05/2009), 98 (06/17/2007)  Chol:149 (02/05/2009), 158 (06/17/2007)  Trig:92.0 (02/05/2009), 111 (06/17/2007)  Orders: TLB-Lipid Panel (80061-LIPID) TLB-Hepatic/Liver Function Pnl (80076-HEPATIC) TLB-TSH (Thyroid Stimulating Hormone) (84443-TSH)  Problem # 3:  GERD (ICD-530.81) no sxs and not on any meds  Complete Medication List: 1)  Ecotrin 325 Mg Tbec (Aspirin) .... Take 1 once a day 2)  Lovastatin 40 Mg Tabs (Lovastatin) .... Take 1 tablet by mouth once a day 3)  Ocuvite Tabs (Multiple vitamins-minerals) .... Take 1 tablet by mouth once a day 4)  Fluticasone Propionate 50 Mcg/act Susp (Fluticasone propionate) .... 2 sprays each nostril once daily 5)  Benazepril-hydrochlorothiazide 20-25 Mg Tabs (Benazepril-hydrochlorothiazide) .[name].. 1 tab by mouth daily 6)  Triamcinolone Acetonide 0.5 % Crea (Triamcinolone acetonide) .... Apply two times a day to affected area 7)  Multivitamins Tabs (Multiple vitamin) .... Once daily  Patient Instructions: 1)  Please schedule a follow-up appointment in 3 months.

## 2010-09-26 NOTE — Assessment & Plan Note (Signed)
Summary: bp issues//ccm   Vital Signs:  Patient profile:   77 year old male Weight:      184 pounds Temp:     98.2 degrees F oral Pulse rate:   78 / minute Pulse rhythm:   regular Resp:     16 per minute BP sitting:   152 / 98  Vitals Entered By: Lynann Beaver CMA (May 21, 2010 9:19 AM) CC: rov Is Patient Diabetic? No Pain Assessment Patient in pain? no        CC:  rov.  History of Present Illness: after starting increased dose of micardis he got out of bed and felt very lightheaded---no syncope. later he changed micardis to 40 mg by mouth two times a day----lightheadedness better but BPs are elevated: 134-163/80-95.  He remembers that benazepril was discontinued for an "itchy foot"--- he thinks that was related to fungal infection  All other systems reviewed and were negative excpet arthritic aches and pain.   Current Medications (verified): 1)  Ecotrin 325 Mg Tbec (Aspirin) .... Take 1 Once A Day 2)  Lovastatin 40 Mg Tabs (Lovastatin) .... Take 1 Tablet By Mouth Once A Day 3)  Ocuvite  Tabs (Multiple Vitamins-Minerals) .... Take 1 Tablet By Mouth Once A Day 4)  Triamcinolone Acetonide 0.5 % Crea (Triamcinolone Acetonide) .... Apply Two Times A Day To Affected Area 5)  Multivitamins  Tabs (Multiple Vitamin) .... Once Daily 6)  Micardis 80 Mg Tabs (Telmisartan) .... One By Mouth Once Daily  Allergies (verified): 1)  ! Amlodipine Besylate (Amlodipine Besylate) 2)  Aspirin (Aspirin)  Past History:  Past Medical History: Last updated: 06/17/2007 Allergic rhinitis GERD Hyperlipidemia knee pain macular degeneration  2004 Osteoarthritis Hypertension  Past Surgical History: Last updated: 02/22/2007 laminectomy  1988  Family History: Last updated: 10/18/2007 Family History Breast cancer 1st degree relative <50-daughter  Social History: Last updated: 03/17/2007 Married Former Smoker  Risk Factors: Smoking Status: current (04/02/2010) Cigars/wk: one  half/week (04/02/2010)  Physical Exam  General:  alert and well-developed.   Nose:  no external deformity and nose piercing noted.   Neck:  supple and full ROM.   Lungs:  normal respiratory effort and no intercostal retractions.   Heart:  normal rate and regular rhythm.   Abdomen:  soft and non-tender.   Extremities:  2+ edema to midcalf bilaterally   Impression & Recommendations:  Problem # 1:  HYPERTENSION (ICD-401.9) controlled continue current medications  The following medications were removed from the medication list:    Micardis 80 Mg Tabs (Telmisartan) ..... One by mouth once daily His updated medication list for this problem includes:    Micardis 80 Mg Tabs (Telmisartan) .[name]... 1/2 by mouth two times a day    Hydrochlorothiazide 25 Mg Tabs (Hydrochlorothiazide) .[name]... Take 1 tab by mouth every morning  BP today: 152/98 Prior BP: 170/92 (05/10/2010)  Prior 10 Yr Risk Heart Disease: 27 % (05/10/2010)  Labs Reviewed: K+: 4.4 (10/12/2009) Creat: : 1.5 (10/12/2009)   Chol: 162 (10/12/2009)   HDL: 54.10 (10/12/2009)   LDL: 84 (10/12/2009)   TG: 118.0 (10/12/2009)  Complete Medication List: 1)  Ecotrin 325 Mg Tbec (Aspirin) .... Take 1 once a day 2)  Lovastatin 40 Mg Tabs (Lovastatin) .... Take 1 tablet by mouth once a day 3)  Ocuvite Tabs (Multiple vitamins-minerals) .... Take 1 tablet by mouth once a day 4)  Triamcinolone Acetonide 0.5 % Crea (Triamcinolone acetonide) .... Apply two times a day to affected area 5)  Multivitamins Tabs (  Multiple vitamin) .... Once daily 6)  Micardis 80 Mg Tabs (Telmisartan) .... 1/2 by mouth two times a day 7)  Hydrochlorothiazide 25 Mg Tabs (Hydrochlorothiazide) .... Take 1 tab by mouth every morning  Patient Instructions: 1)  . Prescriptions: HYDROCHLOROTHIAZIDE 25 MG  TABS (HYDROCHLOROTHIAZIDE) Take 1 tab by mouth every morning  #30 x 3   Entered and Authorized by:   Birdie Sons MD   Signed by:   Birdie Sons MD on 05/21/2010    Method used:   [facility] to        [facility] [phone][facility] (retail)       [address]       [place], [place]  [address]       Ph: [phone] or [phone]       Fax: [phone]   RxID:   [id number]

## 2010-09-26 NOTE — Progress Notes (Signed)
Summary: throid check  Phone Note Call from Patient Call back at Home Phone [phone]   Caller: Spouse Summary of Call: wife is calling to see if the patient can have a thyroid check? Initial call taken by: Kern Reap CMA Duncan Dull),  March 18, 2010 12:59 PM  Follow-up for Phone Call        tsh---780.7 Follow-up by: Birdie Sons MD,  March 19, 2010 8:07 AM  Additional Follow-up for Phone Call Additional follow up Details #1::        appointment made Additional Follow-up by: Kern Reap CMA Duncan Dull),  March 19, 2010 10:44 AM

## 2010-09-26 NOTE — Assessment & Plan Note (Signed)
Summary: knne injection per dr//ccm    History of Present Illness: pain knee duration -years progressive severity " up to 6/10 interferes with daily activity no associatied sxs  All other systems reviewed and were negative   Current Problems (verified): 1)  Rash-nonvesicular  (ICD-782.1) 2)  Osteoarthritis, Hip, Right  (ICD-715.95) 3)  Carcinoma, Skin, Squamous Cell  (ICD-173.9) 4)  Hypertension  (ICD-401.9) 5)  Macular Degeneration  (ICD-362.50) 6)  Hyperlipidemia  (ICD-272.4) 7)  Gerd  (ICD-530.81)  Current Medications (verified): 1)  Ecotrin 325 Mg Tbec (Aspirin) .... Take 1 Once A Day 2)  Lovastatin 40 Mg Tabs (Lovastatin) .... Take 1 Tablet By Mouth Once A Day 3)  Ocuvite  Tabs (Multiple Vitamins-Minerals) .... Take 1 Tablet By Mouth Once A Day 4)  Fluticasone Propionate 50 Mcg/act  Susp (Fluticasone Propionate) .... 2 Sprays Each Nostril Once Daily 5)  Triamcinolone Acetonide 0.5 % Crea (Triamcinolone Acetonide) .... Apply Two Times A Day To Affected Area 6)  Multivitamins  Tabs (Multiple Vitamin) .... Once Daily 7)  Micardis 80 Mg Tabs (Telmisartan) .... 1/2  By Mouth Daily  Allergies: 1)  ! Amlodipine Besylate (Amlodipine Besylate) 2)  Aspirin (Aspirin)  Past History:  Past Medical History: Last updated: 06/17/2007 Allergic rhinitis GERD Hyperlipidemia knee pain macular degeneration  2004 Osteoarthritis Hypertension  Past Surgical History: Last updated: 02/22/2007 laminectomy  1988  Family History: Last updated: 10/18/2007 Family History Breast cancer 1st degree relative <50-daughter  Social History: Last updated: 03/17/2007 Married Former Smoker  Risk Factors: Smoking Status: current (01/01/2010) Cigars/wk: one half/week (01/01/2010)  Physical Exam  General:  Well-developed,well-nourished,in no acute distress; alert,appropriate and cooperative throughout examination Msk:  limited flexion of both knees effusion right knee   Impression &  Recommendations:  Problem # 1:  KNEE PAIN (ICD-719.46)  likely OA of knee discussed options after risks and benefits discussedhe gave verbal consent for infection His updated medication list for this problem includes:    Ecotrin 325 Mg Tbec (Aspirin) .[name]... Take 1 once a day  Orders: Joint Aspirate / Injection, Large (20610) Depo- Medrol 40mg  (J1030)  Complete Medication List: 1)  Ecotrin 325 Mg Tbec (Aspirin) .... Take 1 once a day 2)  Lovastatin 40 Mg Tabs (Lovastatin) .... Take 1 tablet by mouth once a day 3)  Ocuvite Tabs (Multiple vitamins-minerals) .... Take 1 tablet by mouth once a day 4)  Fluticasone Propionate 50 Mcg/act Susp (Fluticasone propionate) .... 2 sprays each nostril once daily 5)  Triamcinolone Acetonide 0.5 % Crea (Triamcinolone acetonide) .... Apply two times a day to affected area 6)  Multivitamins Tabs (Multiple vitamin) .... Once daily 7)  Micardis 80 Mg Tabs (Telmisartan) .... 1/2  by mouth daily

## 2010-09-26 NOTE — Assessment & Plan Note (Signed)
Summary: 2 mo rov/mm   Vital Signs:  Patient profile:   77 year old male Weight:      181 pounds BMI:     27.62 Temp:     98.3 degrees F oral Pulse rate:   62 / minute Pulse rhythm:   irregular Resp:     12 per minute BP sitting:   174 / 88  (left arm) Cuff size:   regular  Vitals Entered By: Gladis Riffle, RN (April 02, 2010 10:56 AM)  Serial Vital Signs/Assessments:  Time      Position  BP       Pulse  Resp  Temp     By                     14/78                          Birdie Sons MD  CC: 2 month rov, lab done Is Patient Diabetic? No Comments bandaid from hip injection took skin off with bandaid   CC:  2 month rov and lab done.  History of Present Illness:  Follow-Up Visit      This is an 77 year old man who presents for Follow-up visit.  The patient denies chest pain and palpitations.  Since the last visit the patient notes no new problems or concerns.  The patient reports taking meds as prescribed.  When questioned about possible medication side effects, the patient notes none.    All other systems reviewed and were negative  had recent steroid injection---hip much better    Preventive Screening-Counseling & Management  Alcohol-Tobacco     Smoking Status: current     Year Quit: X 35 YEARS     Cigars/week: one half/week  Current Problems (verified): 1)  Osteoarthritis, Hip, Right  (ICD-715.95) 2)  Carcinoma, Skin, Squamous Cell  (ICD-173.9) 3)  Hypertension  (ICD-401.9) 4)  Macular Degeneration  (ICD-362.50) 5)  Hyperlipidemia  (ICD-272.4) 6)  Gerd  (ICD-530.81)  Current Medications (verified): 1)  Ecotrin 325 Mg Tbec (Aspirin) .... Take 1 Once A Day 2)  Lovastatin 40 Mg Tabs (Lovastatin) .... Take 1 Tablet By Mouth Once A Day 3)  Ocuvite  Tabs (Multiple Vitamins-Minerals) .... Take 1 Tablet By Mouth Once A Day 4)  Triamcinolone Acetonide 0.5 % Crea (Triamcinolone Acetonide) .... Apply Two Times A Day To Affected Area 5)  Multivitamins  Tabs (Multiple  Vitamin) .... Once Daily 6)  Micardis 80 Mg Tabs (Telmisartan) .... 1/2  By Mouth Daily  Allergies: 1)  ! Amlodipine Besylate (Amlodipine Besylate) 2)  Aspirin (Aspirin)  Past History:  Past Medical History: Last updated: 06/17/2007 Allergic rhinitis GERD Hyperlipidemia knee pain macular degeneration  2004 Osteoarthritis Hypertension  Past Surgical History: Last updated: 02/22/2007 laminectomy  1988  Family History: Last updated: 10/18/2007 Family History Breast cancer 1st degree relative <50-daughter  Social History: Last updated: 03/17/2007 Married Former Smoker  Risk Factors: Smoking Status: current (04/02/2010) Cigars/wk: one half/week (04/02/2010)  Physical Exam  General:  alert and well-developed.   Head:  normocephalic and atraumatic.   Eyes:  pupils equal and pupils round.   Ears:  R ear normal and L ear normal.   Neck:  No deformities, masses, or tenderness noted. Lungs:  normal respiratory effort and no intercostal retractions.   Heart:  normal rate and regular rhythm.   Abdomen:  soft and non-tender.   Msk:  pain with  internal and external rotation of hip Pulses:  R radial normal and L radial normal.   Neurologic:  cranial nerves II-XII intact and gait normal.   Skin:  turgor normal and color normal.     Impression & Recommendations:  Problem # 1:  HYPERTENSION (ICD-401.9) home BPs---130/70s average His updated medication list for this problem includes:    Micardis 80 Mg Tabs (Telmisartan) .[name]... 1/2  by mouth daily  BP today: 174/88 Prior BP: 138/80 (03/14/2010)  Labs Reviewed: K+: 4.4 (10/12/2009) Creat: : 1.5 (10/12/2009)   Chol: 162 (10/12/2009)   HDL: 54.10 (10/12/2009)   LDL: 84 (10/12/2009)   TG: 118.0 (10/12/2009)  Problem # 2:  OSTEOARTHRITIS, HIP, RIGHT (ICD-715.95)  much improved His updated medication list for this problem includes:    Ecotrin 325 Mg Tbec (Aspirin) .[name]... Take 1 once a day  Discussed use of medications,  application of heat or cold, and exercises.   Problem # 3:  GERD (ICD-530.81) no sxs and not on any meds  Complete Medication List: 1)  Ecotrin 325 Mg Tbec (Aspirin) .... Take 1 once a day 2)  Lovastatin 40 Mg Tabs (Lovastatin) .... Take 1 tablet by mouth once a day 3)  Ocuvite Tabs (Multiple vitamins-minerals) .... Take 1 tablet by mouth once a day 4)  Triamcinolone Acetonide 0.5 % Crea (Triamcinolone acetonide) .... Apply two times a day to affected area 5)  Multivitamins Tabs (Multiple vitamin) .... Once daily 6)  Micardis 80 Mg Tabs (Telmisartan) .... 1/2  by mouth daily  Contraindications/Deferment of Procedures/Staging:    Test/Procedure: Colonoscopy    Reason for deferment: not indicated   Patient Instructions: 1)  Please schedule a follow-up appointment in 2 months.

## 2010-09-26 NOTE — Assessment & Plan Note (Signed)
Summary: fup bp//ccm   Vital Signs:  Patient profile:   77 year old male Weight:      182 pounds BMI:     27.77 Temp:     98.4 degrees F oral Pulse rate:   56 / minute Pulse rhythm:   regular Resp:     12 per minute BP sitting:   138 / 84  (left arm) Cuff size:   regular  Vitals Entered By: Gladis Riffle, RN (January 29, 2010 11:19 AM) CC: FU BP--BP 143/87 this AM, states better in afternoon Is Patient Diabetic? No   CC:  FU BP--BP 143/87 this AM and states better in afternoon.  History of Present Illness:  Follow-Up Visit      This is an 77 year old man who presents for Follow-up visit.  The patient denies chest pain and palpitations.  Since the last visit the patient notes no new problems or concerns.  The patient reports taking meds as prescribed.  When questioned about possible medication side effects, the patient notes none.    All other systems reviewed and were negative   Preventive Screening-Counseling & Management  Alcohol-Tobacco     Smoking Status: current     Year Quit: X 35 YEARS     Cigars/week: one half/week  Current Medications (verified): 1)  Ecotrin 325 Mg Tbec (Aspirin) .... Take 1 Once A Day 2)  Lovastatin 40 Mg Tabs (Lovastatin) .... Take 1 Tablet By Mouth Once A Day 3)  Ocuvite  Tabs (Multiple Vitamins-Minerals) .... Take 1 Tablet By Mouth Once A Day 4)  Triamcinolone Acetonide 0.5 % Crea (Triamcinolone Acetonide) .... Apply Two Times A Day To Affected Area 5)  Multivitamins  Tabs (Multiple Vitamin) .... Once Daily 6)  Micardis 80 Mg Tabs (Telmisartan) .... 1/2  By Mouth Daily  Allergies: 1)  ! Amlodipine Besylate (Amlodipine Besylate) 2)  Aspirin (Aspirin)  Past History:  Past Medical History: Last updated: 06/17/2007 Allergic rhinitis GERD Hyperlipidemia knee pain macular degeneration  2004 Osteoarthritis Hypertension  Past Surgical History: Last updated: 02/22/2007 laminectomy  1988  Family History: Last updated: 10/18/2007 Family  History Breast cancer 1st degree relative <50-daughter  Social History: Last updated: 03/17/2007 Married Former Smoker  Risk Factors: Smoking Status: current (01/29/2010) Cigars/wk: one half/week (01/29/2010)  Review of Systems       All other systems reviewed and were negative   Physical Exam  General:  alert and well-developed.   Head:  normocephalic and atraumatic.   Neck:  No deformities, masses, or tenderness noted. Lungs:  normal respiratory effort and no intercostal retractions.   Heart:  normal rate and regular rhythm.   Neurologic:  broad based gait   Impression & Recommendations:  Problem # 1:  HYPERTENSION (ICD-401.9) adequate control continue current medications  His updated medication list for this problem includes:    Micardis 80 Mg Tabs (Telmisartan) .[name]... 1/2  by mouth daily  BP today: 138/84 Prior BP: 132/76 (01/01/2010)  Labs Reviewed: K+: 4.4 (10/12/2009) Creat: : 1.5 (10/12/2009)   Chol: 162 (10/12/2009)   HDL: 54.10 (10/12/2009)   LDL: 84 (10/12/2009)   TG: 118.0 (10/12/2009)  Complete Medication List: 1)  Ecotrin 325 Mg Tbec (Aspirin) .... Take 1 once a day 2)  Lovastatin 40 Mg Tabs (Lovastatin) .... Take 1 tablet by mouth once a day 3)  Ocuvite Tabs (Multiple vitamins-minerals) .... Take 1 tablet by mouth once a day 4)  Triamcinolone Acetonide 0.5 % Crea (Triamcinolone acetonide) .... Apply two times  a day to affected area 5)  Multivitamins Tabs (Multiple vitamin) .... Once daily 6)  Micardis 80 Mg Tabs (Telmisartan) .... 1/2  by mouth daily  Patient Instructions: 1)  Please schedule a follow-up appointment in 2 months. Prescriptions: MICARDIS 80 MG TABS (TELMISARTAN) 1/2  by mouth daily  #30 x 3   Entered and Authorized by:   Birdie Sons MD   Signed by:   Birdie Sons MD on 01/29/2010   Method used:   Electronically to        [facility] [phone]* (retail)       [address]       [place], [place]  [address]       Ph: [phone] or  [phone]       Fax: [phone]   RxID:   [id number] TRIAMCINOLONE ACETONIDE 0.5 % CREA (TRIAMCINOLONE ACETONIDE) apply two times a day to affected area  #30 grams x PRN   Entered and Authorized by:   Birdie Sons MD   Signed by:   Birdie Sons MD on 01/29/2010   Method used:   Electronically to        [facility] [phone]* (retail)       [address]       [place], [place]  [address]       Ph: [phone] or [phone]       Fax: [phone]   RxID:   [id number] LOVASTATIN 40 MG TABS (LOVASTATIN) Take 1 tablet by mouth once a day  #90 x 3   Entered and Authorized by:   Birdie Sons MD   Signed by:   Birdie Sons MD on 01/29/2010   Method used:   Electronically to        [facility] [phone]* (retail)       [address]       [place], [place]  [address]       Ph: [phone] or [phone]       Fax: [phone]   RxID:   [id number]

## 2010-09-26 NOTE — Assessment & Plan Note (Signed)
Summary: elevated BP/dm   Vital Signs:  Patient profile:   77 year old male Weight:      185 pounds Temp:     97.8 degrees F oral BP sitting:   170 / 92  (left arm) Cuff size:   regular  Vitals Entered By: Sid Falcon LPN (May 10, 2010 9:31 AM) CC: Elevated BP, Hypertension Management   History of Present Illness: Here with concerns of increasing blood pressure past month. Systolics around 140-160.  Some mild dizziness but no other symptoms. Takes Micardis 80 mg one half tablet daily.  Has taken Norvasc and lisinopril in past but had side effects.  Hypertension History:      He denies headache, chest pain, palpitations, dyspnea with exertion, orthopnea, visual symptoms, neurologic problems, and syncope.        Positive major cardiovascular risk factors include male age 75 years old or older, hyperlipidemia, hypertension, and current tobacco user.     Allergies: 1)  ! Amlodipine Besylate (Amlodipine Besylate) 2)  Aspirin (Aspirin)  Past History:  Past Medical History: Last updated: 06/17/2007 Allergic rhinitis GERD Hyperlipidemia knee pain macular degeneration  2004 Osteoarthritis Hypertension PMH reviewed for relevance  Review of Systems      See HPI  Physical Exam  General:  Well-developed,well-nourished,in no acute distress; alert,appropriate and cooperative throughout examination Neck:  No deformities, masses, or tenderness noted. Lungs:  Normal respiratory effort, chest expands symmetrically. Lungs are clear to auscultation, no crackles or wheezes. Heart:  normal rate and regular rhythm.   Extremities:  trace nonpitting edema legs.   Impression & Recommendations:  Problem # 1:  HYPERTENSION (ICD-401.9) Assessment Deteriorated titrate Micardis to one whole tablet (80mg ) and f/u with primary in one month. His updated medication list for this problem includes:    Micardis 80 Mg Tabs (Telmisartan) ..... One by mouth once daily  Complete  Medication List: 1)  Ecotrin 325 Mg Tbec (Aspirin) .... Take 1 once a day 2)  Lovastatin 40 Mg Tabs (Lovastatin) .... Take 1 tablet by mouth once a day 3)  Ocuvite Tabs (Multiple vitamins-minerals) .... Take 1 tablet by mouth once a day 4)  Triamcinolone Acetonide 0.5 % Crea (Triamcinolone acetonide) .... Apply two times a day to affected area 5)  Multivitamins Tabs (Multiple vitamin) .... Once daily 6)  Micardis 80 Mg Tabs (Telmisartan) .... One by mouth once daily  Hypertension Assessment/Plan:      The patient's hypertensive risk group is category B: At least one risk factor (excluding diabetes) with no target organ damage.  His calculated 10 year risk of coronary heart disease is 27 %.  Today's blood pressure is 170/92.    Patient Instructions: 1)  Increase Micardis to one whole tablet and keep follow up with Dr Cato Mulligan as scheduled.

## 2010-10-02 NOTE — Assessment & Plan Note (Signed)
Summary: 6 wk rov/njr   Vital Signs:  Patient profile:   77 year old male Weight:      184 pounds Temp:     98.4 degrees F oral BP sitting:   136 / 84  (left arm) Cuff size:   large  Vitals Entered By: Alfred Levins, CMA (September 23, 2010 11:00 AM) CC: rt hip pain   CC:  rt hip pain.  History of Present Illness: last week had real trouble walking---right hip pain feels much better today and almost never has any hip pain at rest.  pain is laterally located, can be intense.  reviewed previous hip injection 06/08/10---pt states it helped some but not tremendously. no radiation of pain no recent trauma.   ROS: "otherwise i feel great".    Current Problems (verified): 1)  Osteoarthritis, Hip, Right  (ICD-715.95) 2)  Carcinoma, Skin, Squamous Cell  (ICD-173.9) 3)  Hypertension  (ICD-401.9) 4)  Macular Degeneration  (ICD-362.50) 5)  Hyperlipidemia  (ICD-272.4) 6)  Gerd  (ICD-530.81)  Current Medications (verified): 1)  Ecotrin 325 Mg Tbec (Aspirin) .... Take 1 Once A Day 2)  Lovastatin 40 Mg Tabs (Lovastatin) .... Take 1 Tablet By Mouth Once A Day 3)  Micardis 80 Mg Tabs (Telmisartan) .... 1/2 By Mouth Two Times A Day 4)  Hydrochlorothiazide 25 Mg  Tabs (Hydrochlorothiazide) .... Take 1 Tab By Mouth Every Morning  Allergies (verified): 1)  ! Amlodipine Besylate (Amlodipine Besylate) 2)  Aspirin (Aspirin)      Physical Exam  General:   elderly male in no acute distress. HEENT exam atraumatic, normocephalic, neck supple. Chest clear to auscultation S1 and S2 are regular. He has decreased range of motion of both hips. Some discomfort with  internal/external rotation of the right hip.   Impression & Recommendations:  Problem # 1:  OSTEOARTHRITIS, HIP, RIGHT (ICD-715.95)  we'll try low-dose Celebrex. side effects discussed. His updated medication list for this problem includes:    Ecotrin 325 Mg Tbec (Aspirin) .[name]... Take 1 once a day    Celebrex 200 Mg Caps (Celecoxib)  .[name]... Take 1 tablet by mouth once a day  Problem # 2:  HYPERTENSION (ICD-401.9) controlled continue current medications  His updated medication list for this problem includes:    Micardis 80 Mg Tabs (Telmisartan) .[name]... 1/2 by mouth two times a day    Hydrochlorothiazide 25 Mg Tabs (Hydrochlorothiazide) .[name]... Take 1 tab by mouth every morning  BP today: 136/84 Prior BP: 152/88 (08/05/2010)  Prior 10 Yr Risk Heart Disease: 40 % (08/05/2010)  Labs Reviewed: K+: 4.7 (06/04/2010) Creat: : 1.3 (06/04/2010)   Chol: 176 (06/04/2010)   HDL: 46.00 (06/04/2010)   LDL: 108 (06/04/2010)   TG: 109.0 (06/04/2010)  Problem # 3:  HYPERLIPIDEMIA (ICD-272.4) controlled continue current medications  His updated medication list for this problem includes:    Lovastatin 40 Mg Tabs (Lovastatin) .[name]... Take 1 tablet by mouth once a day  Labs Reviewed: SGOT: 21 (06/04/2010)   SGPT: 16 (06/04/2010)  Lipid Goals: Chol Goal: 200 (08/05/2010)   HDL Goal: 40 (08/05/2010)   LDL Goal: 100 (08/05/2010)   TG Goal: 150 (08/05/2010)  Prior 10 Yr Risk Heart Disease: 40 % (08/05/2010)   HDL:46.00 (06/04/2010), 54.10 (10/12/2009)  LDL:108 (06/04/2010), 84 (16/05/9603)  Chol:176 (06/04/2010), 162 (10/12/2009)  Trig:109.0 (06/04/2010), 118.0 (10/12/2009)  Complete Medication List: 1)  Ecotrin 325 Mg Tbec (Aspirin) .... Take 1 once a day 2)  Lovastatin 40 Mg Tabs (Lovastatin) .... Take 1 tablet by mouth  once a day 3)  Micardis 80 Mg Tabs (Telmisartan) .... 1/2 by mouth two times a day 4)  Hydrochlorothiazide 25 Mg Tabs (Hydrochlorothiazide) .... Take 1 tab by mouth every morning 5)  Celebrex 200 Mg Caps (Celecoxib) .... Take 1 tablet by mouth once a day  Patient Instructions: 1)  3 weeks   Orders Added: 1)  Est. Patient Level III [[address]]

## 2010-10-16 ENCOUNTER — Encounter: Payer: Self-pay | Admitting: Internal Medicine

## 2010-10-23 ENCOUNTER — Ambulatory Visit (INDEPENDENT_AMBULATORY_CARE_PROVIDER_SITE_OTHER): Payer: PRIVATE HEALTH INSURANCE | Admitting: Internal Medicine

## 2010-10-23 ENCOUNTER — Encounter: Payer: Self-pay | Admitting: Internal Medicine

## 2010-10-23 DIAGNOSIS — M169 Osteoarthritis of hip, unspecified: Secondary | ICD-10-CM

## 2010-10-23 DIAGNOSIS — K219 Gastro-esophageal reflux disease without esophagitis: Secondary | ICD-10-CM

## 2010-10-23 DIAGNOSIS — I10 Essential (primary) hypertension: Secondary | ICD-10-CM

## 2010-10-23 MED ORDER — LOSARTAN POTASSIUM 50 MG PO TABS: 50.0000 mg | ORAL_TABLET | Freq: Every day | ORAL | Status: DC

## 2010-10-23 NOTE — Progress Notes (Signed)
  Subjective:    Patient ID: Roger [name], male    DOB: 10-23-1919, 77 y.o.   MRN: [id number]  HPI  htn---has run out of micardis two days ago (bp today is still well controlled) Hip pain secondary to OA---using cane with good success Lipids---tolerating meds GERD---exacerbated by celebrex--he has discontinued  Past Medical History  Diagnosis Date  . CARCINOMA, SKIN, SQUAMOUS CELL 05/26/2008  . GERD 02/22/2007  . HYPERLIPIDEMIA 02/22/2007  . HYPERTENSION 06/17/2007  . MACULAR DEGENERATION 02/22/2007  . OSTEOARTHRITIS, HIP, RIGHT 06/05/2009   Past Surgical History  Procedure Date  . Laminectomy 1988    reports that he has quit smoking. He does not have any smokeless tobacco history on file. His alcohol and drug histories not on file. family history includes Breast cancer in his daughter.    Allergies  Allergen Reactions  . Amlodipine Besylate     REACTION: swelling of feet  . Aspirin     REACTION: GI upset can tolerate ecotrin     Review of Systems  patient denies chest pain, shortness of breath, orthopnea. Denies lower extremity edema, abdominal pain, change in appetite, change in bowel movements. Patient denies rashes, musculoskeletal complaints. No other specific complaints in a complete review of systems.      Objective:   Physical Exam  well-developed well-nourished male in no acute distress. HEENT exam atraumatic, normocephalic, neck supple without jugular venous distention. Chest clear to auscultation cardiac exam S1-S2 are regular. Abdominal exam with bowel sounds, soft and nontender. Extremities no edema. Neurologic exam is alert with a normal gait.        Assessment & Plan:

## 2010-10-23 NOTE — Assessment & Plan Note (Signed)
Doing well with cane

## 2010-10-23 NOTE — Assessment & Plan Note (Signed)
BP controlled Change meds due to insurance noncverage of micardis---losartan

## 2010-10-23 NOTE — Assessment & Plan Note (Signed)
Controlled and not on any meds currently

## 2010-12-17 ENCOUNTER — Ambulatory Visit (INDEPENDENT_AMBULATORY_CARE_PROVIDER_SITE_OTHER): Payer: PRIVATE HEALTH INSURANCE | Admitting: Internal Medicine

## 2010-12-17 DIAGNOSIS — I10 Essential (primary) hypertension: Secondary | ICD-10-CM

## 2010-12-17 DIAGNOSIS — E785 Hyperlipidemia, unspecified: Secondary | ICD-10-CM

## 2010-12-17 LAB — LIPID PANEL
HDL: 48.9 mg/dL (ref >39.00)
VLDL: 26.8 mg/dL (ref 0.0–40.0)

## 2010-12-17 LAB — HEPATIC FUNCTION PANEL
ALT: 16 U/L (ref 0–53)
Albumin: 4 g/dL (ref 3.5–5.2)
Total Bilirubin: 0.6 mg/dL (ref 0.3–1.2)
Total Protein: 6.8 g/dL (ref 6.0–8.3)

## 2010-12-17 LAB — BASIC METABOLIC PANEL
GFR: 49.87 mL/min — ABNORMAL LOW (ref >60.00)
Glucose, Bld: 80 mg/dL (ref 70–99)
Potassium: 5.5 mEq/L — ABNORMAL HIGH (ref 3.5–5.1)
Sodium: 143 mEq/L (ref 135–145)

## 2010-12-17 MED ORDER — TRAMADOL HCL 50 MG PO TABS: 50.0000 mg | ORAL_TABLET | Freq: Every day | ORAL | Status: DC | PRN

## 2010-12-17 NOTE — Progress Notes (Signed)
  Subjective:    Patient ID: Roger [name], male    DOB: [birth year], 77 y.o.   MRN: [id number]  HPI  F/u htn Tolerating losartan Home bps 120s/60s but occasionally can see bps in the 160s  OA knee--R>L  Past Medical History  Diagnosis Date  . CARCINOMA, SKIN, SQUAMOUS CELL 05/26/2008  . GERD 02/22/2007  . HYPERLIPIDEMIA 02/22/2007  . HYPERTENSION 06/17/2007  . MACULAR DEGENERATION 02/22/2007  . OSTEOARTHRITIS, HIP, RIGHT 06/05/2009   Past Surgical History  Procedure Date  . Laminectomy 1988    reports that he has quit smoking. He does not have any smokeless tobacco history on file. His alcohol and drug histories not on file. family history includes Breast cancer in his daughter. Allergies  Allergen Reactions  . Amlodipine Besylate     REACTION: swelling of feet  . Aspirin     REACTION: GI upset can tolerate ecotrin     Review of Systems  patient denies chest pain, shortness of breath, orthopnea. Denies lower extremity edema, abdominal pain, change in appetite, change in bowel movements. Patient denies rashes, musculoskeletal complaints. No other specific complaints in a complete review of systems.      Objective:   Physical Exam  well-developed well-nourished male in no acute distress. HEENT exam atraumatic, normocephalic, neck supple without jugular venous distention. Chest clear to auscultation cardiac exam S1-S2 are regular. Abdominal exam overweight with bowel sounds, soft and nontender. Extremities no edema. Neurologic exam is alert. Uses a cane for walking.        Assessment & Plan:

## 2010-12-17 NOTE — Assessment & Plan Note (Signed)
i've advised him to take BP meds in the evening---might help a.m. Bps Continue meds

## 2010-12-18 ENCOUNTER — Telehealth: Payer: Self-pay | Admitting: *Deleted

## 2010-12-18 NOTE — Telephone Encounter (Signed)
Dr swords told pt and wife that he was going to send in med for him yesterday while they were here at ov- no meds at pharmacy-wants you to check on it

## 2010-12-19 NOTE — Telephone Encounter (Signed)
Wanting tramadol rx, called in to [facility], pt aware

## 2010-12-20 ENCOUNTER — Other Ambulatory Visit: Payer: Self-pay | Admitting: Internal Medicine

## 2010-12-20 ENCOUNTER — Other Ambulatory Visit (INDEPENDENT_AMBULATORY_CARE_PROVIDER_SITE_OTHER): Payer: PRIVATE HEALTH INSURANCE

## 2010-12-20 DIAGNOSIS — E875 Hyperkalemia: Secondary | ICD-10-CM

## 2010-12-20 LAB — BASIC METABOLIC PANEL
GFR: 50.69 mL/min — ABNORMAL LOW (ref >60.00)
Glucose, Bld: 114 mg/dL — ABNORMAL HIGH (ref 70–99)
Potassium: 4.3 mEq/L (ref 3.5–5.1)
Sodium: 141 mEq/L (ref 135–145)

## 2010-12-23 ENCOUNTER — Other Ambulatory Visit: Payer: Self-pay | Admitting: *Deleted

## 2010-12-23 ENCOUNTER — Other Ambulatory Visit: Payer: PRIVATE HEALTH INSURANCE

## 2010-12-23 DIAGNOSIS — I10 Essential (primary) hypertension: Secondary | ICD-10-CM

## 2010-12-23 MED ORDER — HYDROCHLOROTHIAZIDE 25 MG PO TABS: 25.0000 mg | ORAL_TABLET | Freq: Every day | ORAL | Status: DC

## 2011-01-10 NOTE — Op Note (Signed)
Roger [name], Roger [name]               ACCOUNT NO.:  [id number]   MEDICAL RECORD NO.:  [id number]          PATIENT TYPE:  AMB   LOCATION:  SDS                          FACILITY:  [facility]   PHYSICIAN:  Alford Highland. Rankin, M.D.   DATE OF BIRTH:  11/15/1919   DATE OF PROCEDURE:  04/20/2006  DATE OF DISCHARGE:  04/20/2006                                 OPERATIVE REPORT   PREOPERATIVE DIAGNOSIS:  Epiretinal membrane - left eye, with significant  impairment and __________ visual acuity, left eye.   POSTOPERATIVE DIAGNOSES:  1. Epiretinal membrane - left eye, with significant impairment and      __________ visual acuity, left eye.  2. Retinal hole, left eye, superotemporally.   PROCEDURE:  1. Posterior vitrectomy, membrane peel, left eye - internal limiting      membrane.  2. Focal laser photocoagulation for retinopexy purposes.   SURGEON:  Alford Highland. Rankin, MD   ANESTHESIA:  Local retrobulbar with anesthesia control, left eye.   INDICATIONS FOR PROCEDURE:  The patient is an 77 year old man with vision  loss on the basis of epiretinal membrane, as well as found to have a retinal  hole.  The patient understands this is an attempt to improve the visual  acuity and visual functioning by removing the epiretinal membrane.  He  understands the risks of anesthesia, including the rare occurrence of death.  He also understands the risks to the eye including, but not limited to, from  the condition and attempted surgical repair, hemorrhage,  infection,  scarring, need for another surgery, no change in vision, loss of vision,  progressive disease despite intervention.  Appropriate signed consent was  obtained.  The patient taken to the operating room.   In the operating room, appropriate monitoring was followed by mild sedation.  Marcaine 0.75% was delivered 5 mL retrobulbar followed by an additional 5 mL  laterally in the fashion of modified Darel Hong.  The left periocular area was  sterilely prepped  and draped in the usual sterile fashion.  A lid speculum  applied.  A 25-gauge trocar system was then used to place the infusion.  Superior trocars were applied.  Core vitrectomy was then begun.  Vitreous  skirt trimmed and circumcised 360 degrees.  At this time, 25-gauge membrane  forceps were then used to engage the internal limiting membrane with striae  at 360 over the macular region.  This mobilization occurred without  difficulty.  No complications occurred.   Traction was released.  Typical changes in the retina of the ILM removal  were apparent.  Focal laser photocoagulation placed found an atrophic  retinal hole in the superotemporal quadrant anterior to the equator.  Midperipheral laser photocoagulation was also placed inferiorly, temporally  and superiorly for retinopexy purposes.   At this time, the instruments were removed from the eye.  Vitreous  incarceration in the trocars was absent.  Trocars were removed and the  intraocular pressures maintained.  Infusion removed.  Subconjunctival  Decadron applied.  Sterile patch and Fox shield applied.  The patient taken  to the Short Stay.  He will  be discharged home as an outpatient.      Alford Highland Rankin, M.D.  Electronically Signed     GAR/MEDQ  D:  04/20/2006  T:  04/21/2006  Job:  [id number]

## 2011-01-22 ENCOUNTER — Other Ambulatory Visit: Payer: Self-pay | Admitting: Internal Medicine

## 2011-01-27 ENCOUNTER — Telehealth: Payer: Self-pay | Admitting: *Deleted

## 2011-01-27 NOTE — Telephone Encounter (Signed)
Pt c/o knee swelling since yesterday, appt scheduled to see Dr. Cato Mulligan 01/29/11 @ 11:15, however pt would like to be worked in sooner.

## 2011-01-28 ENCOUNTER — Ambulatory Visit (INDEPENDENT_AMBULATORY_CARE_PROVIDER_SITE_OTHER): Payer: PRIVATE HEALTH INSURANCE | Admitting: Internal Medicine

## 2011-01-28 VITALS — BP 170/94

## 2011-01-28 DIAGNOSIS — IMO0002 Reserved for concepts with insufficient information to code with codable children: Secondary | ICD-10-CM

## 2011-01-28 DIAGNOSIS — M1710 Unilateral primary osteoarthritis, unspecified knee: Secondary | ICD-10-CM

## 2011-01-28 NOTE — Telephone Encounter (Signed)
Have him come in today for joint infection only

## 2011-01-28 NOTE — Telephone Encounter (Signed)
Notified pt. 

## 2011-01-29 ENCOUNTER — Ambulatory Visit: Payer: PRIVATE HEALTH INSURANCE | Admitting: Internal Medicine

## 2011-02-04 MED ORDER — METHYLPREDNISOLONE ACETATE 80 MG/ML IJ SUSP: 80.0000 mg | Freq: Once | INTRAMUSCULAR | Status: DC

## 2011-02-04 NOTE — Progress Notes (Signed)
  Knee Arthrocentesis with Injection Procedure Note  Pre-operative Diagnosis: right knee  Post-operative Diagnosis: same  Indications: Symptom relief from osteoarthritis  Anesthesia: Lidocaine 1% without epinephrine without added sodium bicarbonate  Procedure Details   Verbal consent was obtained for the procedure. The joint was prepped with Betadine and a small wheel of anesthetic was injected into the subcutaneous tissue. A 22 gauge needle was inserted into the superior aspect of the joint from a lateral approach. 0 ml of  fluid was removed from the joint. 1 ml 1% lidocaine and 1 ml of depomedrol was then injected into the joint through the same needle. The needle was removed and the area cleansed and dressed.  Complications:  None; patient tolerated the procedure well.

## 2011-04-05 ENCOUNTER — Other Ambulatory Visit: Payer: Self-pay | Admitting: Internal Medicine

## 2011-05-12 ENCOUNTER — Other Ambulatory Visit: Payer: Self-pay | Admitting: Internal Medicine

## 2011-05-28 ENCOUNTER — Encounter: Payer: Self-pay | Admitting: Internal Medicine

## 2011-05-28 ENCOUNTER — Ambulatory Visit (INDEPENDENT_AMBULATORY_CARE_PROVIDER_SITE_OTHER): Payer: PRIVATE HEALTH INSURANCE | Admitting: Internal Medicine

## 2011-05-28 VITALS — BP 162/98 | Temp 98.0°F | Wt 175.0 lb

## 2011-05-28 DIAGNOSIS — R300 Dysuria: Secondary | ICD-10-CM

## 2011-05-28 DIAGNOSIS — I10 Essential (primary) hypertension: Secondary | ICD-10-CM

## 2011-05-28 LAB — POCT URINALYSIS DIPSTICK
Bilirubin, UA: NEGATIVE
Glucose, UA: NEGATIVE
Leukocytes, UA: NEGATIVE
Nitrite, UA: NEGATIVE

## 2011-05-28 MED ORDER — TOBRAMYCIN-DEXAMETHASONE 0.3-0.1 % OP SUSP: 1.0000 [drp] | Freq: Three times a day (TID) | OPHTHALMIC | Status: AC

## 2011-05-28 NOTE — Assessment & Plan Note (Signed)
Home BPs 130s/70s conitnue same meds

## 2011-05-28 NOTE — Progress Notes (Signed)
  Subjective:    Patient ID: Roger [name], male    DOB: 10/31/1919, 77 y.o.   MRN: [id number]  HPI One week ago---felt poorly--mostly itchy watery eyes and nose. Took claritin much better.   Urination---has urge incontinence for several weeks.  Has chronic OA hips and knees---uses tramadol---"makes me too sleepy". Right hip is the most significant. Not bad enough to have another injection at this time.   Past Medical History  Diagnosis Date  . CARCINOMA, SKIN, SQUAMOUS CELL 05/26/2008  . GERD 02/22/2007  . HYPERLIPIDEMIA 02/22/2007  . HYPERTENSION 06/17/2007  . MACULAR DEGENERATION 02/22/2007  . OSTEOARTHRITIS, HIP, RIGHT 06/05/2009   Past Surgical History  Procedure Date  . Laminectomy 1988    reports that he has quit smoking. He does not have any smokeless tobacco history on file. His alcohol and drug histories not on file. family history includes Breast cancer in his daughter. Allergies  Allergen Reactions  . Amlodipine Besylate     REACTION: swelling of feet  . Aspirin     REACTION: GI upset can tolerate ecotrin      Review of Systems  patient denies chest pain, shortness of breath, orthopnea. Denies lower extremity edema, abdominal pain, change in appetite, change in bowel movements. Patient denies rashes, musculoskeletal complaints. No other specific complaints in a complete review of systems.      Objective:   Physical Exam   well-developed well-nourished male in no acute distress. HEENT exam atraumatic, normocephalic, conjunctival erythema neck supple without jugular venous distention. Chest clear to auscultation cardiac exam S1-S2 are regular. Abdominal exam overweight with bowel sounds, soft and nontender. Extremities  Trace edema    Assessment & Plan:

## 2011-06-04 ENCOUNTER — Telehealth: Payer: Self-pay | Admitting: *Deleted

## 2011-06-04 DIAGNOSIS — M25559 Pain in unspecified hip: Secondary | ICD-10-CM

## 2011-06-04 NOTE — Telephone Encounter (Signed)
Pt was in a lot of pain over the weekend with his rt hip and rt knee.  The tramadol helps but when he walks around the pain is bad.  Pt's wife wants to know if Dr Cato Mulligan would like to go ahead with the orthopedic referral

## 2011-06-05 NOTE — Telephone Encounter (Signed)
Referral order entered

## 2011-06-05 NOTE — Telephone Encounter (Signed)
Refer to ortho.

## 2011-06-09 ENCOUNTER — Telehealth: Payer: Self-pay | Admitting: Internal Medicine

## 2011-06-09 NOTE — Telephone Encounter (Signed)
Asked her to give me bp readings from this weekend 174/105, 159/98.  Dr Kirtland Bouchard was already gone for the day so per Dr Lovell Sheehan keep taking BP med.  When can you see him?  He also needs to discuss his pain meds.  Dr Darrelyn Hillock said he needed to get together with Dr Cato Mulligan to discuss pain medicine.

## 2011-06-09 NOTE — Telephone Encounter (Signed)
Pt is having hip pain and seen Dr Darrelyn Hillock on Saturday.  BP right now is 135/74

## 2011-06-09 NOTE — Telephone Encounter (Signed)
Pt went to Ortho doctor and said that pt needs to get fup with Dr Cato Mulligan asap. Pts bp this wkend 105/105, 74/98. Pt req call back asap from Dr Cato Mulligan nurse.

## 2011-06-09 NOTE — Telephone Encounter (Signed)
Ask patient to hold the hydrochlorothiazide until evaluated by Dr. Cato Mulligan this week or next

## 2011-06-10 NOTE — Telephone Encounter (Signed)
Needs to know when to see you????

## 2011-06-10 NOTE — Telephone Encounter (Signed)
Stay off HCTZ---remove from med list Increase losartan to 100 mg po qd.

## 2011-06-11 ENCOUNTER — Other Ambulatory Visit: Payer: Self-pay | Admitting: *Deleted

## 2011-06-11 MED ORDER — LOSARTAN POTASSIUM 100 MG PO TABS: 100.0000 mg | ORAL_TABLET | Freq: Every day | ORAL | Status: DC

## 2011-06-13 NOTE — Telephone Encounter (Signed)
appt scheduled

## 2011-06-16 ENCOUNTER — Ambulatory Visit (INDEPENDENT_AMBULATORY_CARE_PROVIDER_SITE_OTHER): Payer: PRIVATE HEALTH INSURANCE | Admitting: Internal Medicine

## 2011-06-16 ENCOUNTER — Other Ambulatory Visit: Payer: Self-pay | Admitting: Internal Medicine

## 2011-06-16 ENCOUNTER — Encounter: Payer: Self-pay | Admitting: Internal Medicine

## 2011-06-16 VITALS — BP 138/84 | HR 79

## 2011-06-16 DIAGNOSIS — M169 Osteoarthritis of hip, unspecified: Secondary | ICD-10-CM

## 2011-06-16 DIAGNOSIS — K219 Gastro-esophageal reflux disease without esophagitis: Secondary | ICD-10-CM

## 2011-06-16 DIAGNOSIS — H3530 Unspecified macular degeneration: Secondary | ICD-10-CM

## 2011-06-16 DIAGNOSIS — I10 Essential (primary) hypertension: Secondary | ICD-10-CM

## 2011-06-16 MED ORDER — HYDROCODONE-ACETAMINOPHEN 5-325 MG PO TABS: 0.5000 | ORAL_TABLET | Freq: Four times a day (QID) | ORAL | Status: AC | PRN

## 2011-06-16 NOTE — Progress Notes (Signed)
  Subjective:    Patient ID: Roger [name], male    DOB: [birth year], 77 y.o.   MRN: [id number]  HPI  Right hip pain---followed by dr Darrelyn Hillock.  htn---tolerating meds  Past Medical History  Diagnosis Date  . CARCINOMA, SKIN, SQUAMOUS CELL 05/26/2008  . GERD 02/22/2007  . HYPERLIPIDEMIA 02/22/2007  . HYPERTENSION 06/17/2007  . MACULAR DEGENERATION 02/22/2007  . OSTEOARTHRITIS, HIP, RIGHT 06/05/2009   Past Surgical History  Procedure Date  . Laminectomy 1988    reports that he has quit smoking. He does not have any smokeless tobacco history on file. His alcohol and drug histories not on file. family history includes Breast cancer in his daughter. Allergies  Allergen Reactions  . Amlodipine Besylate     REACTION: swelling of feet  . Aspirin     REACTION: GI upset can tolerate ecotrin     Review of Systems  patient denies chest pain, shortness of breath, orthopnea. Denies lower extremity edema, abdominal pain, change in appetite, change in bowel movements. Patient denies rashes, musculoskeletal complaints. No other specific complaints in a complete review of systems.      Objective:   Physical Exam  well-developed well-nourished male in no acute distress. HEENT exam atraumatic, normocephalic, neck supple without jugular venous distention. Chest clear to auscultation cardiac exam S1-S2 are regular. Abdominal exam overweight with bowel sounds, soft and nontender. Extremities no edema.broad based gait---using cane.        Assessment & Plan:

## 2011-06-19 ENCOUNTER — Ambulatory Visit
Admission: RE | Admit: 2011-06-19 | Discharge: 2011-06-19 | Disposition: A | Discharge status: Home / Self Care | Payer: PRIVATE HEALTH INSURANCE | Source: Ambulatory Visit | Attending: Internal Medicine | Admitting: Internal Medicine

## 2011-06-19 ENCOUNTER — Other Ambulatory Visit: Payer: PRIVATE HEALTH INSURANCE

## 2011-06-19 DIAGNOSIS — M169 Osteoarthritis of hip, unspecified: Secondary | ICD-10-CM

## 2011-06-19 DIAGNOSIS — H3530 Unspecified macular degeneration: Secondary | ICD-10-CM | POA: Insufficient documentation

## 2011-06-19 MED ORDER — IOHEXOL 180 MG/ML  SOLN
1.0000 mL | Freq: Once | INTRAMUSCULAR | Status: AC | PRN
  Administered 2011-06-19: 1 mL via INTRA_ARTICULAR

## 2011-06-19 MED ORDER — METHYLPREDNISOLONE ACETATE 40 MG/ML INJ SUSP (RADIOLOG
120.0000 mg | Freq: Once | INTRAMUSCULAR | Status: AC
  Administered 2011-06-19: 120 mg via INTRA_ARTICULAR

## 2011-06-19 MED ORDER — METHYLPREDNISOLONE ACETATE 40 MG/ML INJ SUSP (RADIOLOG: 120.0000 mg | Freq: Once | INTRAMUSCULAR | Status: DC

## 2011-06-20 NOTE — Assessment & Plan Note (Signed)
Patient struggles with hip and knee pain. He is now 77 years old. I would like to avoid surgical intervention. Will treat with narcotics as needed. Side effects discussed.

## 2011-06-23 ENCOUNTER — Telehealth: Payer: Self-pay | Admitting: *Deleted

## 2011-06-23 MED ORDER — DILTIAZEM HCL ER COATED BEADS 180 MG PO CP24: 180.0000 mg | ORAL_CAPSULE | Freq: Every day | ORAL | Status: DC

## 2011-06-23 NOTE — Telephone Encounter (Signed)
Current Outpatient Prescriptions on File Prior to Visit  Medication Sig Dispense Refill  . aspirin 325 MG EC tablet Take 325 mg by mouth daily.        [name] HYDROcodone-acetaminophen (NORCO) 5-325 MG per tablet Take 0.5-1 tablets by mouth every 6 (six) hours as needed for pain.  40 tablet  1  . losartan (COZAAR) 100 MG tablet Take 1 tablet (100 mg total) by mouth daily.  90 tablet  3  . lovastatin (MEVACOR) 40 MG tablet TAKE 1 TABLET EVERY DAY  90 tablet  3   Current Facility-Administered Medications on File Prior to Visit  Medication Dose Route Frequency Provider Last Rate Last Dose  . methylPREDNISolone acetate (DEPO-MEDROL) injection 80 mg  80 mg Intra-articular Once Judie Petit, MD       Add cardizem 180 mg po qd #30/6

## 2011-06-23 NOTE — Telephone Encounter (Signed)
Pt aware, rx sent in electronically 

## 2011-06-23 NOTE — Telephone Encounter (Signed)
Pt is not having anymore pain in his hip but his blood pressure has been 179/104, 172/100, 179/90, 170/93.  He is taking losartan 100mg  once daily

## 2011-06-24 ENCOUNTER — Telehealth: Payer: Self-pay | Admitting: *Deleted

## 2011-06-24 NOTE — Telephone Encounter (Addendum)
Pt's BP was still running high today, but wife was afraid to give him all the meds.  Assured her that he  should take these meds that Dr. Cato Mulligan prescribed, and give it a few days to work, and try to keep Roger [name] as calm as possible.

## 2011-06-26 NOTE — Telephone Encounter (Signed)
Dr. Cato Mulligan was made aware.

## 2011-07-04 ENCOUNTER — Ambulatory Visit (INDEPENDENT_AMBULATORY_CARE_PROVIDER_SITE_OTHER): Payer: PRIVATE HEALTH INSURANCE | Admitting: Family Medicine

## 2011-07-04 ENCOUNTER — Telehealth: Payer: Self-pay | Admitting: Internal Medicine

## 2011-07-04 ENCOUNTER — Encounter: Payer: Self-pay | Admitting: Family Medicine

## 2011-07-04 DIAGNOSIS — I10 Essential (primary) hypertension: Secondary | ICD-10-CM

## 2011-07-04 DIAGNOSIS — R21 Rash and other nonspecific skin eruption: Secondary | ICD-10-CM

## 2011-07-04 DIAGNOSIS — K5900 Constipation, unspecified: Secondary | ICD-10-CM

## 2011-07-04 MED ORDER — HYDROCHLOROTHIAZIDE 12.5 MG PO CAPS: 12.5000 mg | ORAL_CAPSULE | Freq: Every day | ORAL | Status: DC

## 2011-07-04 NOTE — Progress Notes (Signed)
  Subjective:    Patient ID: Roger [name], male    DOB: Dec 29, 1919, 77 y.o.   MRN: [id number]  HPI  Patient seen for the following issues. He has history of hypertension treated with losartan 100 mg daily. Recent elevated blood pressure and addition of diltiazem 180 mg daily. After starting that medication patient has developed persistent pruritic rash mostly involving the buttocks and legs. Rash tends to start several hours after taking medication. He is convinced this is secondary to diltiazem. No other new medications. Has been on some hydrocodone per orthopedist but rash started after the diltiazem. Previous intolerance with edema with amlodipine.  Patient also relates some recent constipation. Using hydrocodone per orthopedist as above. Recently started a stool softener which does help somewhat. Denies any chest pains. No dyspnea. No dizziness. No headaches  Past Medical History  Diagnosis Date  . CARCINOMA, SKIN, SQUAMOUS CELL 05/26/2008  . GERD 02/22/2007  . HYPERLIPIDEMIA 02/22/2007  . HYPERTENSION 06/17/2007  . MACULAR DEGENERATION 02/22/2007  . OSTEOARTHRITIS, HIP, RIGHT 06/05/2009   Past Surgical History  Procedure Date  . Laminectomy 1988    reports that he has quit smoking. He does not have any smokeless tobacco history on file. His alcohol and drug histories not on file. family history includes Breast cancer in his daughter. Allergies  Allergen Reactions  . Amlodipine Besylate     REACTION: swelling of feet  . Aspirin     REACTION: GI upset can tolerate ecotrin      Review of Systems  Constitutional: Negative for appetite change and unexpected weight change.  Respiratory: Negative for cough and shortness of breath.   Cardiovascular: Negative for chest pain.  Gastrointestinal: Positive for constipation. Negative for nausea, vomiting, abdominal pain and blood in stool.  Skin: Positive for rash.  Neurological: Negative for dizziness, syncope and headaches.    Psychiatric/Behavioral: Negative for confusion.       Objective:   Physical Exam  Constitutional: He appears well-developed and well-nourished.  Cardiovascular: Normal rate and regular rhythm.   Pulmonary/Chest: Effort normal and breath sounds normal. No respiratory distress. He has no wheezes. He has no rales.  Musculoskeletal: He exhibits no edema.  Skin:       No rash evident at this time          Assessment & Plan:  #1 hypertension. Suboptimal control. Possible side effect with diltiazem. Continue losartan. Discontinue diltiazem. Add HCTZ 12.5 mg daily and reassess with primary in one month #2 reported skin rash. No evidence for rash at this time. Possible reaction diltiazem. Discontinuation as above #3 constipation. Probably related to hydrocodone use. Increase fluid intake and stool softener.

## 2011-07-04 NOTE — Patient Instructions (Signed)
Stop Diltiazem.  Continue stool softener. Start HCTZ 12.5 mg once daily.

## 2011-07-04 NOTE — Telephone Encounter (Signed)
Order faxed to [facility] supply, pt aware

## 2011-07-04 NOTE — Telephone Encounter (Signed)
Pt need new rx for walker

## 2011-07-16 ENCOUNTER — Telehealth: Payer: Self-pay | Admitting: Internal Medicine

## 2011-07-16 MED ORDER — HYDROCODONE-ACETAMINOPHEN 5-325 MG PO TABS: 1.0000 | ORAL_TABLET | Freq: Four times a day (QID) | ORAL | Status: DC | PRN

## 2011-07-16 NOTE — Telephone Encounter (Signed)
Pharmacy called requesting refill on Norco. Please contact pharmacy

## 2011-07-16 NOTE — Telephone Encounter (Signed)
rx called in, ok per Dr Swords 

## 2011-08-01 ENCOUNTER — Ambulatory Visit: Payer: PRIVATE HEALTH INSURANCE | Admitting: Family Medicine

## 2011-08-01 ENCOUNTER — Ambulatory Visit (INDEPENDENT_AMBULATORY_CARE_PROVIDER_SITE_OTHER): Payer: PRIVATE HEALTH INSURANCE | Admitting: Family Medicine

## 2011-08-01 ENCOUNTER — Encounter: Payer: Self-pay | Admitting: Family Medicine

## 2011-08-01 DIAGNOSIS — I10 Essential (primary) hypertension: Secondary | ICD-10-CM

## 2011-08-01 DIAGNOSIS — H1045 Other chronic allergic conjunctivitis: Secondary | ICD-10-CM

## 2011-08-01 DIAGNOSIS — M1990 Unspecified osteoarthritis, unspecified site: Secondary | ICD-10-CM

## 2011-08-01 DIAGNOSIS — H1010 Acute atopic conjunctivitis, unspecified eye: Secondary | ICD-10-CM

## 2011-08-01 LAB — BASIC METABOLIC PANEL
BUN: 28 mg/dL — ABNORMAL HIGH (ref 6–23)
Creatinine, Ser: 1.5 mg/dL (ref 0.4–1.5)
GFR: 46.04 mL/min — ABNORMAL LOW (ref >60.00)
Potassium: 4.3 mEq/L (ref 3.5–5.1)

## 2011-08-01 MED ORDER — HYDROCODONE-ACETAMINOPHEN 5-325 MG PO TABS: 1.0000 | ORAL_TABLET | Freq: Four times a day (QID) | ORAL | Status: DC | PRN

## 2011-08-01 NOTE — Progress Notes (Signed)
  Subjective:    Patient ID: Roger [name], male    DOB: Apr 02, 1920, 77 y.o.   MRN: [id number]  HPI  Here for followup hypertension. Recent addition of Hydrochlorothiazide and blood pressures have greatly improved. He had rash which did go away after discontinuation of diltiazem. He had some occasional constipation but improved since last visit. No dizziness. No significant peripheral edema issues.  He has severe osteoarthritis knees and hips. Uses very low dose hydrocodone and needs refills. He is taking a half tablet 3-4 times daily. Also complains of some pruritis eyes intermittently. No discharge. No visual changes.  Reviewed:   Past Medical History  Diagnosis Date  . CARCINOMA, SKIN, SQUAMOUS CELL 05/26/2008  . GERD 02/22/2007  . HYPERLIPIDEMIA 02/22/2007  . HYPERTENSION 06/17/2007  . MACULAR DEGENERATION 02/22/2007  . OSTEOARTHRITIS, HIP, RIGHT 06/05/2009   Past Surgical History  Procedure Date  . Laminectomy 1988    reports that he has quit smoking. He does not have any smokeless tobacco history on file. His alcohol and drug histories not on file. family history includes Breast cancer in his daughter. Allergies  Allergen Reactions  . Amlodipine Besylate     REACTION: swelling of feet  . Aspirin     REACTION: GI upset can tolerate ecotrin      Review of Systems  Constitutional: Negative for fever, chills, appetite change and unexpected weight change.  Respiratory: Negative for cough and shortness of breath.   Cardiovascular: Negative for chest pain, palpitations and leg swelling.  Musculoskeletal: Positive for arthralgias.       Objective:   Physical Exam  Constitutional: He appears well-developed and well-nourished.  Cardiovascular: Normal rate and regular rhythm.   Pulmonary/Chest: Effort normal and breath sounds normal. No respiratory distress. He has no wheezes. He has no rales.  Musculoskeletal: He exhibits no edema.  Neurological: He is alert.          Assessment & Plan:  #1 hypertension improved.  Check BMP on HCTZ #2 skin rash resolved #3 osteoarthritis hips and knees. Refill hydrocodone  #4 allergic conjunctivitis. Try over-the-counter eyedrop such as Visine allergy or Naphcon

## 2011-08-05 NOTE — Progress Notes (Signed)
Quick Note:  Pt wife informed ______ 

## 2011-08-15 ENCOUNTER — Encounter: Payer: Self-pay | Admitting: Family Medicine

## 2011-08-15 ENCOUNTER — Ambulatory Visit (INDEPENDENT_AMBULATORY_CARE_PROVIDER_SITE_OTHER): Payer: PRIVATE HEALTH INSURANCE | Admitting: Family Medicine

## 2011-08-15 VITALS — BP 130/70 | Temp 97.6°F | Wt 173.0 lb

## 2011-08-15 DIAGNOSIS — H109 Unspecified conjunctivitis: Secondary | ICD-10-CM

## 2011-08-15 MED ORDER — TOBRAMYCIN-DEXAMETHASONE 0.3-0.1 % OP SUSP: 1.0000 [drp] | OPHTHALMIC | Status: DC

## 2011-08-15 NOTE — Progress Notes (Signed)
  Subjective:    Patient ID: Roger [name], male    DOB: 1919-11-16, 77 y.o.   MRN: [id number]  HPI  Patient with bilateral eye drainage left greater than right past few days. He had a few weeks of pruritus involving both eyes. He tried Visine eyedrops without much improvement. Yellow to crusted drainage just appeared within the past day. Similar process back in October which responded promptly with TobraDex. No contact use. No recent injury. He has macular degeneration so chronic visual impairment but no recent changes.  Review of Systems  Constitutional: Negative for fever and chills.  Eyes: Positive for discharge, redness and itching. Negative for photophobia, pain and visual disturbance.       Objective:   Physical Exam  Constitutional: He appears well-developed and well-nourished.  Eyes:       Minimal erythema both eyes conjunctiva. Minimal yellowish drainage lower lids bilaterally. Otherwise no acute findings  Cardiovascular: Normal rate and regular rhythm.   Pulmonary/Chest: Effort normal and breath sounds normal. No respiratory distress. He has no wheezes.          Assessment & Plan:  Bilateral conjunctivitis. TobraDex eyedrops 4 times a day and touch base next week if not resolving

## 2011-09-21 ENCOUNTER — Other Ambulatory Visit: Payer: Self-pay | Admitting: Internal Medicine

## 2011-09-25 ENCOUNTER — Other Ambulatory Visit: Payer: Self-pay | Admitting: *Deleted

## 2011-09-25 DIAGNOSIS — M1990 Unspecified osteoarthritis, unspecified site: Secondary | ICD-10-CM

## 2011-09-25 NOTE — Telephone Encounter (Signed)
Last filled 08-01-11, #40 with 11 refill

## 2011-09-26 MED ORDER — HYDROCODONE-ACETAMINOPHEN 5-325 MG PO TABS: 1.0000 | ORAL_TABLET | Freq: Four times a day (QID) | ORAL | Status: DC | PRN

## 2011-09-26 NOTE — Telephone Encounter (Signed)
Pt wife callback. Pt is out of hydrocodone please call [facility]

## 2011-09-26 NOTE — Telephone Encounter (Signed)
rx called in

## 2011-10-30 ENCOUNTER — Encounter: Payer: Self-pay | Admitting: Internal Medicine

## 2011-10-30 ENCOUNTER — Ambulatory Visit (INDEPENDENT_AMBULATORY_CARE_PROVIDER_SITE_OTHER): Payer: PRIVATE HEALTH INSURANCE | Admitting: Internal Medicine

## 2011-10-30 VITALS — BP 142/86 | Temp 98.2°F | Wt 171.0 lb

## 2011-10-30 DIAGNOSIS — M1710 Unilateral primary osteoarthritis, unspecified knee: Secondary | ICD-10-CM

## 2011-10-30 DIAGNOSIS — I10 Essential (primary) hypertension: Secondary | ICD-10-CM

## 2011-10-30 MED ORDER — OXYBUTYNIN CHLORIDE 5 MG PO TABS: 2.5000 mg | ORAL_TABLET | Freq: Every day | ORAL | Status: DC

## 2011-10-30 NOTE — Assessment & Plan Note (Signed)
Reasonable control  Continue meds 

## 2011-10-30 NOTE — Progress Notes (Signed)
Patient ID: Roger [name], male   DOB: May 17, 1920, 78 y.o.   MRN: [id number] Has celebrated 89th birthday  Main complaint is right knee pain---has been told that the only long term relief is TKA  Some urinary incontinence---minimal  htn--tolerating meds  Lipids---on lovastatin  Past Medical History  Diagnosis Date  . CARCINOMA, SKIN, SQUAMOUS CELL 05/26/2008  . GERD 02/22/2007  . HYPERLIPIDEMIA 02/22/2007  . HYPERTENSION 06/17/2007  . MACULAR DEGENERATION 02/22/2007  . OSTEOARTHRITIS, HIP, RIGHT 06/05/2009    History   Social History  . Marital Status: Married    Spouse Name: N/A    Number of Children: N/A  . Years of Education: N/A   Occupational History  . Not on file.   Social History Main Topics  . Smoking status: Former Games developer  . Smokeless tobacco: Not on file  . Alcohol Use:   . Drug Use:   . Sexually Active:    Other Topics Concern  . Not on file   Social History Narrative  . No narrative on file    Past Surgical History  Procedure Date  . Laminectomy 1988    Family History  Problem Relation Age of Onset  . Breast cancer Daughter     Allergies  Allergen Reactions  . Amlodipine Besylate     REACTION: swelling of feet  . Aspirin     REACTION: GI upset can tolerate ecotrin    Current Outpatient Prescriptions on File Prior to Visit  Medication Sig Dispense Refill  . aspirin 325 MG EC tablet Take 325 mg by mouth daily.        . hydrochlorothiazide (MICROZIDE) 12.5 MG capsule Take 1 capsule (12.5 mg total) by mouth daily.  30 capsule  11  . HYDROcodone-acetaminophen (NORCO) 5-325 MG per tablet Take 1 tablet by mouth every 6 (six) hours as needed for pain.  40 tablet  2  . losartan (COZAAR) 100 MG tablet Take 1 tablet (100 mg total) by mouth daily.  90 tablet  3  . lovastatin (MEVACOR) 40 MG tablet TAKE 1 TABLET EVERY DAY  90 tablet  3  . tobramycin-dexamethasone (TOBRADEX) ophthalmic solution Place 1 drop into both eyes every 4 (four) hours while  awake.  5 mL  0     patient denies chest pain, shortness of breath, orthopnea. Denies lower extremity edema, abdominal pain, change in appetite, change in bowel movements. Patient denies rashes, musculoskeletal complaints. No other specific complaints in a complete review of systems.   BP 142/86  Temp(Src) 98.2 F (36.8 C) (Oral)  Wt 171 lb (77.565 kg)  well-developed well-nourished male in no acute distress. HEENT exam atraumatic, normocephalic, neck supple without jugular venous distention. Chest clear to auscultation cardiac exam S1-S2 are regular. Abdominal exam overweight with bowel sounds, soft and nontender. Extremities no edema. Neurologic exam is alert with a normal gait.   Assessment and plan: #1 urinary incontinence we'll try a low-dose oxybutynin.  Hypertension reasonably controlled.  Knee pain discussed at length. He is osteoarthritis of the knee. Symptoms limit his ability to perform activities of daily living. After risks benefits and alternatives to [place] we agreed to proceed with steroid injection. The knee was prepped and draped in sterile fashion. Skin was infiltrated with 1% lidocaine. The knee joint was entered with a 22-gauge needle from medial approach. 15 cc of clear yellow fluid was removed from the joint. 40 mg of Depo-Medrol injected into the joint with 1 cc of 1% lidocaine. Patient tolerated the procedure well.

## 2011-10-31 ENCOUNTER — Ambulatory Visit: Payer: PRIVATE HEALTH INSURANCE | Admitting: Family Medicine

## 2011-10-31 MED ORDER — METHYLPREDNISOLONE ACETATE 40 MG/ML IJ SUSP: 40.0000 mg | Freq: Once | INTRAMUSCULAR | Status: DC

## 2011-10-31 MED ORDER — LIDOCAINE HCL 1 % IJ SOLN: 1.0000 mL | Freq: Once | INTRAMUSCULAR | Status: DC

## 2011-11-18 ENCOUNTER — Telehealth: Payer: Self-pay | Admitting: *Deleted

## 2011-11-18 NOTE — Telephone Encounter (Signed)
pts edema has increased and he currently taking 12.5 mg of HCTZ.  Wants to know if he can increase to 25 mg qd

## 2011-11-19 MED ORDER — FUROSEMIDE 20 MG PO TABS: 20.0000 mg | ORAL_TABLET | Freq: Every day | ORAL | Status: DC

## 2011-11-19 MED ORDER — POTASSIUM CHLORIDE CRYS ER 20 MEQ PO TBCR: 20.0000 meq | EXTENDED_RELEASE_TABLET | Freq: Every day | ORAL | Status: DC

## 2011-11-19 NOTE — Telephone Encounter (Signed)
His main problem is venous insufficiency---try to wear compression stockings. D/c hctz and try furosemide 20 meq po qd with KDUR 20 meq po qd.

## 2011-11-19 NOTE — Telephone Encounter (Signed)
rx sent in electronically, referred pt to [facility] for compression stockings

## 2011-12-15 ENCOUNTER — Other Ambulatory Visit: Payer: Self-pay | Admitting: *Deleted

## 2011-12-15 DIAGNOSIS — M1990 Unspecified osteoarthritis, unspecified site: Secondary | ICD-10-CM

## 2011-12-15 MED ORDER — HYDROCODONE-ACETAMINOPHEN 5-325 MG PO TABS: 1.0000 | ORAL_TABLET | Freq: Four times a day (QID) | ORAL | Status: DC | PRN

## 2012-01-30 ENCOUNTER — Other Ambulatory Visit: Payer: Self-pay | Admitting: Internal Medicine

## 2012-02-17 ENCOUNTER — Encounter: Payer: Self-pay | Admitting: Internal Medicine

## 2012-02-17 ENCOUNTER — Ambulatory Visit (INDEPENDENT_AMBULATORY_CARE_PROVIDER_SITE_OTHER): Payer: PRIVATE HEALTH INSURANCE | Admitting: Internal Medicine

## 2012-02-17 VITALS — BP 144/82 | HR 72 | Temp 97.8°F | Wt 172.0 lb

## 2012-02-17 DIAGNOSIS — M1710 Unilateral primary osteoarthritis, unspecified knee: Secondary | ICD-10-CM

## 2012-02-17 MED ORDER — LIDOCAINE HCL 1 % IJ SOLN: 1.0000 mL | Freq: Once | INTRAMUSCULAR | Status: DC

## 2012-02-17 NOTE — Progress Notes (Signed)
  Knee Arthrocentesis with Injection Procedure Note  Pre-operative Diagnosis: right jknee  Post-operative Diagnosis: same  Indications: Symptom relief from osteoarthritis  Anesthesia: Lidocaine 1% with epinephrine without added sodium bicarbonate  Procedure Details   Verbal consent was obtained for the procedure. The joint was prepped with Betadine and a small wheel of anesthetic was injected into the subcutaneous tissue. A 22 gauge needle was inserted into the superior aspect of the joint from a lateral approach. 0 ml  fluid was removed from the joint. 1% lidocaine and 1 ml of depomedrol was then injected into the joint through the same needle. The needle was removed and the area cleansed and dressed.  Complications:  None; patient tolerated the procedure well.

## 2012-02-19 ENCOUNTER — Other Ambulatory Visit: Payer: Self-pay | Admitting: *Deleted

## 2012-02-19 DIAGNOSIS — M1990 Unspecified osteoarthritis, unspecified site: Secondary | ICD-10-CM

## 2012-02-19 MED ORDER — HYDROCODONE-ACETAMINOPHEN 5-325 MG PO TABS: 1.0000 | ORAL_TABLET | Freq: Four times a day (QID) | ORAL | Status: DC | PRN

## 2012-03-15 ENCOUNTER — Other Ambulatory Visit: Payer: Self-pay | Admitting: Internal Medicine

## 2012-03-16 ENCOUNTER — Other Ambulatory Visit: Payer: Self-pay | Admitting: Internal Medicine

## 2012-03-17 ENCOUNTER — Telehealth: Payer: Self-pay | Admitting: *Deleted

## 2012-03-17 NOTE — Telephone Encounter (Signed)
Pt has no appetite, he is taking potassium 20 meq and it makes his stomach burn x1 mth.  Pt has a refill at pharmacy and wants to know if he should p/u.  Per Dr Cato Mulligan stop the potassium for 1 wk and call back to tell us how he feels, pts wife aware

## 2012-05-04 ENCOUNTER — Other Ambulatory Visit: Payer: Self-pay | Admitting: *Deleted

## 2012-05-04 DIAGNOSIS — M1990 Unspecified osteoarthritis, unspecified site: Secondary | ICD-10-CM

## 2012-05-04 MED ORDER — HYDROCODONE-ACETAMINOPHEN 5-325 MG PO TABS: 1.0000 | ORAL_TABLET | Freq: Four times a day (QID) | ORAL | Status: DC | PRN

## 2012-05-19 ENCOUNTER — Encounter: Payer: Self-pay | Admitting: Internal Medicine

## 2012-05-19 ENCOUNTER — Ambulatory Visit (INDEPENDENT_AMBULATORY_CARE_PROVIDER_SITE_OTHER): Payer: PRIVATE HEALTH INSURANCE | Admitting: Internal Medicine

## 2012-05-19 VITALS — BP 138/80 | HR 76 | Temp 97.9°F | Wt 172.0 lb

## 2012-05-19 DIAGNOSIS — K219 Gastro-esophageal reflux disease without esophagitis: Secondary | ICD-10-CM

## 2012-05-19 DIAGNOSIS — I10 Essential (primary) hypertension: Secondary | ICD-10-CM

## 2012-05-19 DIAGNOSIS — Z23 Encounter for immunization: Secondary | ICD-10-CM

## 2012-05-19 DIAGNOSIS — E785 Hyperlipidemia, unspecified: Secondary | ICD-10-CM

## 2012-05-19 DIAGNOSIS — M1710 Unilateral primary osteoarthritis, unspecified knee: Secondary | ICD-10-CM

## 2012-05-19 DIAGNOSIS — M179 Osteoarthritis of knee, unspecified: Secondary | ICD-10-CM

## 2012-05-19 LAB — LIPID PANEL
Cholesterol: 139 mg/dL (ref 0–200)
HDL: 44.5 mg/dL (ref >39.00)
LDL Cholesterol: 73 mg/dL (ref 0–99)
Triglycerides: 109 mg/dL (ref 0.0–149.0)
VLDL: 21.8 mg/dL (ref 0.0–40.0)

## 2012-05-19 LAB — HEPATIC FUNCTION PANEL
ALT: 13 U/L (ref 0–53)
Albumin: 3.8 g/dL (ref 3.5–5.2)
Bilirubin, Direct: 0 mg/dL (ref 0.0–0.3)
Total Protein: 7.1 g/dL (ref 6.0–8.3)

## 2012-05-19 LAB — CBC WITH DIFFERENTIAL/PLATELET
Basophils Absolute: 0 10*3/uL (ref 0.0–0.1)
Basophils Relative: 0.6 % (ref 0.0–3.0)
Eosinophils Absolute: 0.2 10*3/uL (ref 0.0–0.7)
Lymphocytes Relative: 22.2 % (ref 12.0–46.0)
MCHC: 32.9 g/dL (ref 30.0–36.0)
Monocytes Relative: 8.5 % (ref 3.0–12.0)
Neutrophils Relative %: 64.8 % (ref 43.0–77.0)
RBC: 3.73 Mil/uL — ABNORMAL LOW (ref 4.22–5.81)

## 2012-05-19 LAB — BASIC METABOLIC PANEL
Chloride: 107 mEq/L (ref 96–112)
GFR: 54.08 mL/min — ABNORMAL LOW (ref >60.00)
Potassium: 3.8 mEq/L (ref 3.5–5.1)
Sodium: 141 mEq/L (ref 135–145)

## 2012-05-19 NOTE — Progress Notes (Signed)
Patient ID: Roger [name], male   DOB: June 04, 1920, 78 y.o.   MRN: [id number] htn- tolerating meds  OA knees-- pain is manageable, uses walker for ambulation  Lipids- tolerating meds  Past Medical History  Diagnosis Date  . CARCINOMA, SKIN, SQUAMOUS CELL 05/26/2008  . GERD 02/22/2007  . HYPERLIPIDEMIA 02/22/2007  . HYPERTENSION 06/17/2007  . MACULAR DEGENERATION 02/22/2007  . OSTEOARTHRITIS, HIP, RIGHT 06/05/2009    History   Social History  . Marital Status: Married    Spouse Name: N/A    Number of Children: N/A  . Years of Education: N/A   Occupational History  . Not on file.   Social History Main Topics  . Smoking status: Former Games developer  . Smokeless tobacco: Not on file  . Alcohol Use:   . Drug Use:   . Sexually Active:    Other Topics Concern  . Not on file   Social History Narrative  . No narrative on file    Past Surgical History  Procedure Date  . Laminectomy 1988    Family History  Problem Relation Age of Onset  . Breast cancer Daughter     Allergies  Allergen Reactions  . Amlodipine Besylate     REACTION: swelling of feet  . Aspirin     REACTION: GI upset can tolerate ecotrin    Current Outpatient Prescriptions on File Prior to Visit  Medication Sig Dispense Refill  . aspirin 325 MG EC tablet Take 325 mg by mouth daily.        . furosemide (LASIX) 20 MG tablet TAKE 1 TABLET EVERY DAY  30 tablet  3  . HYDROcodone-acetaminophen (NORCO/VICODIN) 5-325 MG per tablet Take 1 tablet by mouth every 6 (six) hours as needed for pain.  40 tablet  2  . losartan (COZAAR) 100 MG tablet Take 1 tablet (100 mg total) by mouth daily.  90 tablet  3  . lovastatin (MEVACOR) 40 MG tablet TAKE 1 TABLET EVERY DAY  90 tablet  3  . oxybutynin (DITROPAN) 5 MG tablet TAKE 1/2 TABLET EVERY DAY  30 tablet  1  . DISCONTD: diltiazem (CARDIZEM CD) 180 MG 24 hr capsule Take 180 mg by mouth daily.       [name] DISCONTD: hydrochlorothiazide (MICROZIDE) 12.5 MG capsule Take 1 capsule  (12.5 mg total) by mouth daily.  30 capsule  11   Current Facility-Administered Medications on File Prior to Visit  Medication Dose Route Frequency Provider Last Rate Last Dose  . DISCONTD: lidocaine (XYLOCAINE) 1 % (with pres) injection 1 mL  1 mL Intradermal Once Lindley Magnus, MD      . DISCONTD: lidocaine (XYLOCAINE) 1 % (with pres) injection 1 mL  1 mL Intradermal Once Lindley Magnus, MD      . DISCONTD: methylPREDNISolone acetate (DEPO-MEDROL) injection 40 mg  40 mg Intra-articular Once Lindley Magnus, MD         patient denies chest pain, shortness of breath, orthopnea. Denies lower extremity edema, abdominal pain, change in appetite, change in bowel movements. Patient denies rashes, musculoskeletal complaints. No other specific complaints in a complete review of systems.   BP 152/92  Pulse 76  Temp 97.9 F (36.6 C) (Oral)  Wt 172 lb (78.019 kg) elderly male in no acute distress. HEENT exam atraumatic, normocephalic, neck supple without jugular venous distention. Chest clear to auscultation cardiac exam S1-S2 are regular. Abdominal exam overweight with bowel sounds, soft and nontender. Extremities no edema. Neurologic exam is alert.

## 2012-05-23 NOTE — Assessment & Plan Note (Signed)
Long term use of statin-- continue same

## 2012-05-23 NOTE — Assessment & Plan Note (Signed)
BP Readings from Last 3 Encounters:  05/19/12 138/80  02/17/12 144/82  10/30/11 142/86   Reasonable control Continue same meds

## 2012-05-23 NOTE — Assessment & Plan Note (Signed)
Chronic pain- will avoid surgery

## 2012-05-24 DIAGNOSIS — Z23 Encounter for immunization: Secondary | ICD-10-CM

## 2012-06-01 ENCOUNTER — Other Ambulatory Visit: Payer: Self-pay | Admitting: Internal Medicine

## 2012-07-15 ENCOUNTER — Other Ambulatory Visit: Payer: Self-pay | Admitting: Internal Medicine

## 2012-08-05 ENCOUNTER — Ambulatory Visit (INDEPENDENT_AMBULATORY_CARE_PROVIDER_SITE_OTHER): Payer: PRIVATE HEALTH INSURANCE | Admitting: Family Medicine

## 2012-08-05 ENCOUNTER — Encounter: Payer: Self-pay | Admitting: Family Medicine

## 2012-08-05 VITALS — BP 130/80 | Temp 97.2°F | Wt 168.0 lb

## 2012-08-05 DIAGNOSIS — R05 Cough: Secondary | ICD-10-CM

## 2012-08-05 DIAGNOSIS — M1710 Unilateral primary osteoarthritis, unspecified knee: Secondary | ICD-10-CM

## 2012-08-05 MED ORDER — DICLOFENAC SODIUM 1 % TD GEL: 4.0000 g | Freq: Four times a day (QID) | TRANSDERMAL | Status: DC

## 2012-08-05 NOTE — Patient Instructions (Signed)
Try plain mucinex (over the counter)  Drink plenty of fluids Follow up for any fever or increased shortness of breath.

## 2012-08-05 NOTE — Progress Notes (Signed)
  Subjective:    Patient ID: Roger [name], male    DOB: 1919/09/09, 78 y.o.   MRN: [id number]  HPI Seen for the following:  Cough. Duration about one week.  Pt hard of hearing.  Mostly dry.  Occasional clear sputum. No fever or dyspnea.  Thick mucus.  Occ PND.  Has not taken anything OTC for cough. Nonsmoker.  Right knee pain.  NKI.  Hx osteoarthritis.  Injected 6/13 steroids with only brief relief. No recent falls.   Review of Systems  Constitutional: Negative for fever, appetite change and unexpected weight change.  Respiratory: Positive for cough. Negative for shortness of breath and wheezing.   Cardiovascular: Negative for chest pain.  Musculoskeletal: Positive for arthralgias.       Objective:   Physical Exam  Constitutional: He appears well-developed and well-nourished.  HENT:  Mouth/Throat: Oropharynx is clear and moist.  Cardiovascular: Normal rate and regular rhythm.   Pulmonary/Chest: Effort normal and breath sounds normal. No respiratory distress. He has no wheezes. He has no rales.  Musculoskeletal:       Right knee slightly stiff. No effusion. No warmth. No ecchymosis. Mild medial joint line tenderness.          Assessment & Plan:  #1 cough. No fever or other indication of pneumonia. Try over-the-counter Mucinex and plenty of fluids. Followup promptly for any fever #2 right knee pain. Osteoarthritis. Previous steroid injections with minimal relief. Cautious trial of Voltaren gel 3-4 times daily. He does not give any history of aspirin allergy

## 2012-08-10 ENCOUNTER — Other Ambulatory Visit: Payer: Self-pay | Admitting: Internal Medicine

## 2012-09-23 ENCOUNTER — Other Ambulatory Visit: Payer: Self-pay | Admitting: Internal Medicine

## 2012-10-07 ENCOUNTER — Ambulatory Visit: Payer: PRIVATE HEALTH INSURANCE | Admitting: Family Medicine

## 2012-10-11 ENCOUNTER — Encounter: Payer: Self-pay | Admitting: Family Medicine

## 2012-10-11 ENCOUNTER — Ambulatory Visit (INDEPENDENT_AMBULATORY_CARE_PROVIDER_SITE_OTHER): Payer: PRIVATE HEALTH INSURANCE | Admitting: Family Medicine

## 2012-10-11 VITALS — BP 140/82 | Temp 98.0°F | Wt 173.0 lb

## 2012-10-11 DIAGNOSIS — R0982 Postnasal drip: Secondary | ICD-10-CM

## 2012-10-11 DIAGNOSIS — R05 Cough: Secondary | ICD-10-CM

## 2012-10-11 MED ORDER — FLUTICASONE PROPIONATE 50 MCG/ACT NA SUSP: 2.0000 | Freq: Every day | NASAL | Status: DC

## 2012-10-11 NOTE — Progress Notes (Signed)
  Subjective:    Patient ID: Roger [name], male    DOB: July 28, 1920, 79 y.o.   MRN: [id number]  HPI  Patient seen with several week history of clear postnasal drip symptoms. Intermittent dry cough. No active GERD symptoms. No colored nasal discharge. No bloody nasal discharge. No fevers or chills. Patient denies any headaches. Has not tried any antihistamines. No known seasonal or perennial allergies.  Patient has history of hypertension treated with losartan. He takes furosemide use Fleet about every other day. Chronic peripheral edema stable. Blood pressure well controlled by home readings  Past Medical History  Diagnosis Date  . CARCINOMA, SKIN, SQUAMOUS CELL 05/26/2008  . GERD 02/22/2007  . HYPERLIPIDEMIA 02/22/2007  . HYPERTENSION 06/17/2007  . MACULAR DEGENERATION 02/22/2007  . OSTEOARTHRITIS, HIP, RIGHT 06/05/2009   Past Surgical History  Procedure Laterality Date  . Laminectomy  1988    reports that he has quit smoking. He does not have any smokeless tobacco history on file. His alcohol and drug histories are not on file. family history includes Breast cancer in his daughter. Allergies  Allergen Reactions  . Amlodipine Besylate     REACTION: swelling of feet  . Aspirin     REACTION: GI upset can tolerate ecotrin  . Hydrocodone     GI upset      Review of Systems  Constitutional: Negative for fever and chills.  HENT: Positive for congestion and postnasal drip.   Respiratory: Positive for cough. Negative for shortness of breath and wheezing.   Neurological: Negative for headaches.       Objective:   Physical Exam  Constitutional: He appears well-developed and well-nourished. No distress.  HENT:  Patient has some clear mucus bilaterally. No purulence  Neck: Neck supple. No thyromegaly present.  Cardiovascular: Normal rate and regular rhythm.   Pulmonary/Chest: Effort normal and breath sounds normal. No respiratory distress. He has no wheezes. He has no rales.   Musculoskeletal: He exhibits no edema.  Lymphadenopathy:    He has no cervical adenopathy.          Assessment & Plan:  Persistent postnasal drip. Several week duration. Question allergic rhinitis. Trial of Flonase nasal 2 sprays per nostril once daily. Touch base if symptoms not improving over the next couple of weeks  Hypertension stable by home readings. Check basic metabolic panel at followup

## 2012-10-11 NOTE — Patient Instructions (Addendum)
Touch base in 2 weeks if postnasal drip and cough not improved.

## 2012-10-25 ENCOUNTER — Ambulatory Visit: Payer: PRIVATE HEALTH INSURANCE | Admitting: Family Medicine

## 2012-10-29 ENCOUNTER — Ambulatory Visit: Payer: PRIVATE HEALTH INSURANCE | Admitting: Family Medicine

## 2012-11-04 ENCOUNTER — Ambulatory Visit (INDEPENDENT_AMBULATORY_CARE_PROVIDER_SITE_OTHER): Payer: PRIVATE HEALTH INSURANCE | Admitting: Family Medicine

## 2012-11-04 ENCOUNTER — Encounter: Payer: Self-pay | Admitting: Family Medicine

## 2012-11-04 VITALS — BP 142/88 | Temp 97.4°F | Wt 172.0 lb

## 2012-11-04 DIAGNOSIS — IMO0002 Reserved for concepts with insufficient information to code with codable children: Secondary | ICD-10-CM

## 2012-11-04 DIAGNOSIS — M1710 Unilateral primary osteoarthritis, unspecified knee: Secondary | ICD-10-CM

## 2012-11-04 DIAGNOSIS — I10 Essential (primary) hypertension: Secondary | ICD-10-CM

## 2012-11-04 LAB — BASIC METABOLIC PANEL
CO2: 26 mEq/L (ref 19–32)
Chloride: 106 mEq/L (ref 96–112)
Creatinine, Ser: 1.4 mg/dL (ref 0.4–1.5)

## 2012-11-04 MED ORDER — TRAMADOL HCL 50 MG PO TABS: ORAL_TABLET | ORAL | Status: DC

## 2012-11-04 NOTE — Progress Notes (Signed)
  Subjective:    Patient ID: Roger [name], male    DOB: Jan 23, 1920, 79 y.o.   MRN: [id number]  HPI Medical followup  Ongoing arthritis concerns right knee. Had previous injections with cortisone with limited success He knows not to take oral nonsteroidals because of his age and other medical problems Previous prescription for diclofenac gel but not clear if used this. Does not recall using tramadol but this has been prescribed couple years ago Pain is moderate and sometimes interferes with sleep  Hypertension treated with losartan and furosemide. He has some chronic lower extremity edema. Not elevating legs. No increased dyspnea  Past Medical History  Diagnosis Date  . CARCINOMA, SKIN, SQUAMOUS CELL 05/26/2008  . GERD 02/22/2007  . HYPERLIPIDEMIA 02/22/2007  . HYPERTENSION 06/17/2007  . MACULAR DEGENERATION 02/22/2007  . OSTEOARTHRITIS, HIP, RIGHT 06/05/2009   Past Surgical History  Procedure Laterality Date  . Laminectomy  1988    reports that he has quit smoking. He does not have any smokeless tobacco history on file. His alcohol and drug histories are not on file. family history includes Breast cancer in his daughter. Allergies  Allergen Reactions  . Amlodipine Besylate     REACTION: swelling of feet  . Aspirin     REACTION: GI upset can tolerate ecotrin  . Hydrocodone     GI upset      Review of Systems  Constitutional: Negative for fatigue.  Eyes: Negative for visual disturbance.  Respiratory: Negative for cough, chest tightness and shortness of breath.   Cardiovascular: Negative for chest pain, palpitations and leg swelling.  Neurological: Negative for dizziness, syncope, weakness, light-headedness and headaches.       Objective:   Physical Exam  Constitutional: He appears well-developed and well-nourished.  Neck: Neck supple.  Cardiovascular: Normal rate and regular rhythm.   Pulmonary/Chest: Effort normal and breath sounds normal. No respiratory distress.  He has no wheezes. He has no rales.  Musculoskeletal: He exhibits edema.  Right knee no effusion. Mild crepitus with flexion and extension. No warmth. No erythema  Lymphadenopathy:    He has no cervical adenopathy.          Assessment & Plan:  #1 hypertension. Stable by home readings. Continue current medications. Check basic metabolic panel #2 osteoarthritis involving right knee. Try tramadol 50 mg one to 2 every 6 hours as needed. Consider corticosteroid injection if no better by next visit

## 2012-11-04 NOTE — Patient Instructions (Signed)
Osteoarthritis Osteoarthritis is the most common form of arthritis. It is redness, soreness, and swelling (inflammation) affecting the cartilage. Cartilage acts as a cushion, covering the ends of bones where they meet to form a joint. CAUSES  Over time, the cartilage begins to wear away. This causes bone to rub on bone. This produces pain and stiffness in the affected joints. Factors that contribute to this problem are:  Excessive body weight.  Age.  Overuse of joints. SYMPTOMS   People with osteoarthritis usually experience joint pain, swelling, or stiffness.  Over time, the joint may lose its normal shape.  Small deposits of bone (osteophytes) may grow on the edges of the joint.  Bits of bone or cartilage can break off and float inside the joint space. This may cause more pain and damage.  Osteoarthritis can lead to depression, anxiety, feelings of helplessness, and limitations on daily activities. The most commonly affected joints are in the:  Ends of the fingers.  Thumbs.  Neck.  Lower back.  Knees.  Hips. DIAGNOSIS  Diagnosis is mostly based on your symptoms and exam. Tests may be helpful, including:  X-rays of the affected joint.  A computerized magnetic scan (MRI).  Blood tests to rule out other types of arthritis.  Joint fluid tests. This involves using a needle to draw fluid from the joint and examining the fluid under a microscope. TREATMENT  Goals of treatment are to control pain, improve joint function, maintain a normal body weight, and maintain a healthy lifestyle. Treatment approaches may include:  A prescribed exercise program with rest and joint relief.  Weight control with nutritional education.  Pain relief techniques such as:  Properly applied heat and cold.  Electric pulses delivered to nerve endings under the skin (transcutaneous electrical nerve stimulation, TENS).  Massage.  Certain supplements. Ask your caregiver before using any  supplements, especially in combination with prescribed drugs.  Medicines to control pain, such as:  Acetaminophen.  Nonsteroidal anti-inflammatory drugs (NSAIDs), such as naproxen.  Narcotic or central-acting agents, such as tramadol. This drug carries a risk of addiction and is generally prescribed for short-term use.  Corticosteroids. These can be given orally or as injection. This is a short-term treatment, not recommended for routine use.  Surgery to reposition the bones and relieve pain (osteotomy) or to remove loose pieces of bone and cartilage. Joint replacement may be needed in advanced states of osteoarthritis. HOME CARE INSTRUCTIONS  Your caregiver can recommend specific types of exercise. These may include:  Strengthening exercises. These are done to strengthen the muscles that support joints affected by arthritis. They can be performed with weights or with exercise bands to add resistance.  Aerobic activities. These are exercises, such as brisk walking or low-impact aerobics, that get your heart pumping. They can help keep your lungs and circulatory system in shape.  Range-of-motion activities. These keep your joints limber.  Balance and agility exercises. These help you maintain daily living skills. Learning about your condition and being actively involved in your care will help improve the course of your osteoarthritis. SEEK MEDICAL CARE IF:   You feel hot or your skin turns red.  You develop a rash in addition to your joint pain.  You have an oral temperature above 102 F (38.9 C). FOR MORE INFORMATION  National Institute of Arthritis and Musculoskeletal and Skin Diseases: www.niams.nih.gov National Institute on Aging: www.nia.nih.gov American College of Rheumatology: www.rheumatology.org Document Released: 08/11/2005 Document Revised: 11/03/2011 Document Reviewed: 11/22/2009 [employer] Patient Information 2013 [employer].  

## 2012-11-05 NOTE — Progress Notes (Signed)
Quick Note:  Pt informed ______ 

## 2012-11-18 ENCOUNTER — Ambulatory Visit (INDEPENDENT_AMBULATORY_CARE_PROVIDER_SITE_OTHER): Payer: PRIVATE HEALTH INSURANCE | Admitting: Family Medicine

## 2012-11-18 ENCOUNTER — Encounter: Payer: Self-pay | Admitting: Family Medicine

## 2012-11-18 VITALS — BP 137/82 | Temp 98.0°F

## 2012-11-18 DIAGNOSIS — R040 Epistaxis: Secondary | ICD-10-CM

## 2012-11-18 NOTE — Progress Notes (Signed)
  Subjective:    Patient ID: Roger [name], male    DOB: Mar 27, 1920, 79 y.o.   MRN: [id number]  HPI Patient has chronic problems including history of hypertension, osteoarthritis, hyperlipidemia, GERD, and macular degeneration. Patient relates episode last week of transient dysphagia when eating bread. He subsequently coughed up some thick mucus and has had no swallowing difficulties whatsoever since then. No recent appetite or weight changes.  Last night he had transient nosebleed right nostril. No further bleeding today. Takes aspirin one daily. Recently using Flonase daily. No other bleeding complications.  Past Medical History  Diagnosis Date  . CARCINOMA, SKIN, SQUAMOUS CELL 05/26/2008  . GERD 02/22/2007  . HYPERLIPIDEMIA 02/22/2007  . HYPERTENSION 06/17/2007  . MACULAR DEGENERATION 02/22/2007  . OSTEOARTHRITIS, HIP, RIGHT 06/05/2009   Past Surgical History  Procedure Laterality Date  . Laminectomy  1988    reports that he has quit smoking. He does not have any smokeless tobacco history on file. His alcohol and drug histories are not on file. family history includes Breast cancer in his daughter. Allergies  Allergen Reactions  . Amlodipine Besylate     REACTION: swelling of feet  . Aspirin     REACTION: GI upset can tolerate ecotrin  . Hydrocodone     GI upset      Review of Systems  Constitutional: Negative for fever and chills.  HENT: Negative for sore throat.   Respiratory: Negative for shortness of breath.   Cardiovascular: Negative for chest pain.       Objective:   Physical Exam  Constitutional: He appears well-developed and well-nourished.  HENT:  Right Ear: External ear normal.  Left Ear: External ear normal.  Mouth/Throat: Oropharynx is clear and moist.  Left naris appears normal. He has some clotted blood right naris along the anterior medial septum  Neck: Neck supple.  Cardiovascular: Normal rate and regular rhythm.   Pulmonary/Chest: Effort normal  and breath sounds normal.          Assessment & Plan:  Epistaxis. No active bleeding at this time. Had recent anterior and bleed right naris. Avoid aspirin next few days. Avoid Flonase for the next several days. Try Ocean mist nasal spray. Avoid blowing nose.

## 2012-11-18 NOTE — Patient Instructions (Addendum)
Nosebleed Nosebleeds can be caused by many conditions including trauma, infections, polyps, foreign bodies, dry mucous membranes or climate, medications and air conditioning. Most nosebleeds occur in the front of the nose. It is because of this location that most nosebleeds can be controlled by pinching the nostrils gently and continuously. Do this for at least 10 to 20 minutes. The reason for this long continuous pressure is that you must hold it long enough for the blood to clot. If during that 10 to 20 minute time period, pressure is released, the process may have to be started again. The nosebleed may stop by itself, quit with pressure, need concentrated heating (cautery) or stop with pressure from packing. HOME CARE INSTRUCTIONS   If your nose was packed, try to maintain the pack inside until your caregiver removes it. If a gauze pack was used and it starts to fall out, gently replace or cut the end off. Do not cut if a balloon catheter was used to pack the nose. Otherwise, do not remove unless instructed.  Avoid blowing your nose for 12 hours after treatment. This could dislodge the pack or clot and start bleeding again.  If the bleeding starts again, sit up and bending forward, gently pinch the front half of your nose continuously for 20 minutes.  If bleeding was caused by dry mucous membranes, cover the inside of your nose every morning with a petroleum or antibiotic ointment. Use your little fingertip as an applicator. Do this as needed during dry weather. This will keep the mucous membranes moist and allow them to heal.  Maintain humidity in your home by using less air conditioning or using a humidifier.  Do not use aspirin or medications which make bleeding more likely. Your caregiver can give you recommendations on this.  Resume normal activities as able but try to avoid straining, lifting or bending at the waist for several days.  If the nosebleeds become recurrent and the cause is  unknown, your caregiver may suggest laboratory tests. SEEK IMMEDIATE MEDICAL CARE IF:   Bleeding recurs and cannot be controlled.  There is unusual bleeding from or bruising on other parts of the body.  You have a fever.  Nosebleeds continue.  There is any worsening of the condition which originally brought you in.  You become lightheaded, feel faint, become sweaty or vomit blood. MAKE SURE YOU:   Understand these instructions.  Will watch your condition.  Will get help right away if you are not doing well or get worse. Document Released: 05/21/2005 Document Revised: 11/03/2011 Document Reviewed: 07/13/2009 [facility] Patient Information 2013 [place], [place].  Hold aspirin for 2-3 days Avoid blowing nose  Hold Fluticasone for 3-4 days Use saline nasal spray such as Ocean Mist at least twice daily to avoid drying

## 2013-01-19 ENCOUNTER — Other Ambulatory Visit: Payer: Self-pay | Admitting: Internal Medicine

## 2013-02-11 ENCOUNTER — Encounter: Payer: Self-pay | Admitting: Internal Medicine

## 2013-02-11 ENCOUNTER — Ambulatory Visit (INDEPENDENT_AMBULATORY_CARE_PROVIDER_SITE_OTHER): Payer: PRIVATE HEALTH INSURANCE | Admitting: Internal Medicine

## 2013-02-11 VITALS — BP 150/90 | HR 76 | Temp 98.1°F | Wt 167.0 lb

## 2013-02-11 DIAGNOSIS — M1710 Unilateral primary osteoarthritis, unspecified knee: Secondary | ICD-10-CM

## 2013-02-11 DIAGNOSIS — E785 Hyperlipidemia, unspecified: Secondary | ICD-10-CM

## 2013-02-11 DIAGNOSIS — M169 Osteoarthritis of hip, unspecified: Secondary | ICD-10-CM

## 2013-02-11 MED ORDER — CETIRIZINE HCL 10 MG PO TABS: 10.0000 mg | ORAL_TABLET | Freq: Every day | ORAL | Status: DC

## 2013-02-11 MED ORDER — DICLOFENAC SODIUM 1 % TD GEL: 2.0000 g | Freq: Four times a day (QID) | TRANSDERMAL | Status: DC

## 2013-02-12 NOTE — Assessment & Plan Note (Signed)
oa knee and hip Continue pain meds

## 2013-02-12 NOTE — Progress Notes (Signed)
Patient ID: Roger [name], male   DOB: 09-18-1919, 79 y.o.   MRN: [id number] htn- tolerating meds  Knee pain- interferes with walking, has known OA Steroid injections in the past-- last for 3 weeks  Lipids- tolerating statin  Past Medical History  Diagnosis Date  . CARCINOMA, SKIN, SQUAMOUS CELL 05/26/2008  . GERD 02/22/2007  . HYPERLIPIDEMIA 02/22/2007  . HYPERTENSION 06/17/2007  . MACULAR DEGENERATION 02/22/2007  . OSTEOARTHRITIS, HIP, RIGHT 06/05/2009    History   Social History  . Marital Status: Married    Spouse Name: N/A    Number of Children: N/A  . Years of Education: N/A   Occupational History  . Not on file.   Social History Main Topics  . Smoking status: Former Games developer  . Smokeless tobacco: Not on file  . Alcohol Use:   . Drug Use:   . Sexually Active:    Other Topics Concern  . Not on file   Social History Narrative  . No narrative on file    Past Surgical History  Procedure Laterality Date  . Laminectomy  1988    Family History  Problem Relation Age of Onset  . Breast cancer Daughter     Allergies  Allergen Reactions  . Amlodipine Besylate     REACTION: swelling of feet  . Aspirin     REACTION: GI upset can tolerate ecotrin  . Hydrocodone     GI upset    Current Outpatient Prescriptions on File Prior to Visit  Medication Sig Dispense Refill  . aspirin 325 MG EC tablet Take 325 mg by mouth daily.        . fluticasone (FLONASE) 50 MCG/ACT nasal spray Place 2 sprays into the nose daily.  16 g  6  . furosemide (LASIX) 20 MG tablet TAKE 1 TABLET EVERY DAY  30 tablet  3  . losartan (COZAAR) 100 MG tablet TAKE 1 TABLET BY MOUTH EVERY DAY  90 tablet  3  . lovastatin (MEVACOR) 40 MG tablet TAKE 1 TABLET EVERY DAY  90 tablet  3  . oxybutynin (DITROPAN) 5 MG tablet TAKE 1/2 TABLET BY MOUTH EVERY DAY  30 tablet  1  . traMADol (ULTRAM) 50 MG tablet 1-2 tablets every 6 hours prn pain  60 tablet  3  . [DISCONTINUED] diltiazem (CARDIZEM CD) 180 MG 24  hr capsule Take 180 mg by mouth daily.       . [DISCONTINUED] hydrochlorothiazide (MICROZIDE) 12.5 MG capsule Take 1 capsule (12.5 mg total) by mouth daily.  30 capsule  11  . [DISCONTINUED] oxybutynin (DITROPAN) 5 MG tablet TAKE 1/2 TABLET EVERY DAY  30 tablet  1   No current facility-administered medications on file prior to visit.     patient denies chest pain, shortness of breath, orthopnea. Denies lower extremity edema, abdominal pain, change in appetite, change in bowel movements. Patient denies rashes, musculoskeletal complaints. No other specific complaints in a complete review of systems.   BP 150/90  Pulse 76  Temp(Src) 98.1 F (36.7 C) (Oral)  Wt 167 lb (75.751 kg)  BMI 25.4 kg/m2   well-developed well-nourished male in no acute distress. HEENT exam atraumatic, normocephalic, neck supple without jugular venous distention. Chest clear to auscultation cardiac exam S1-S2 are regular. Abdominal exam overweight with bowel sounds, soft and nontender. Extremities no edema. Neurologic exam is alert with a walking with walker- broad based gait

## 2013-02-12 NOTE — Assessment & Plan Note (Signed)
Continue walker Pain control will be difficult- may consider steroid injection

## 2013-02-12 NOTE — Assessment & Plan Note (Signed)
Lipid Panel     Component Value Date/Time   CHOL 139 05/19/2012 0947   TRIG 109.0 05/19/2012 0947   HDL 44.50 05/19/2012 0947   CHOLHDL 3 05/19/2012 0947   VLDL 21.8 05/19/2012 0947   LDLCALC 73 05/19/2012 0947   controlled

## 2013-04-12 ENCOUNTER — Other Ambulatory Visit: Payer: Self-pay | Admitting: *Deleted

## 2013-04-12 MED ORDER — LOVASTATIN 40 MG PO TABS: ORAL_TABLET | ORAL | Status: DC

## 2013-05-03 ENCOUNTER — Other Ambulatory Visit: Payer: Self-pay | Admitting: Internal Medicine

## 2013-05-17 ENCOUNTER — Other Ambulatory Visit: Payer: Self-pay | Admitting: Internal Medicine

## 2013-05-27 ENCOUNTER — Ambulatory Visit: Payer: PRIVATE HEALTH INSURANCE | Admitting: Internal Medicine

## 2013-06-06 ENCOUNTER — Other Ambulatory Visit: Payer: Self-pay | Admitting: Family Medicine

## 2013-06-07 ENCOUNTER — Telehealth: Payer: Self-pay | Admitting: Internal Medicine

## 2013-06-07 NOTE — Telephone Encounter (Signed)
Pt wife is calling to inquire about the status of a pre-authorization for the pt's traMADol (ULTRAM) 50 MG tablet. Please assist .

## 2013-06-07 NOTE — Telephone Encounter (Signed)
rx called in

## 2013-06-07 NOTE — Telephone Encounter (Signed)
Patient Information:  Caller Name: Roger Roger [name] Roger [name]  Phone: [phone]  Patient: Roger Roger [name], Roger [name]  Gender: Male  DOB: 1919/11/23  Age: 79 Years  PCP: Birdie Sons (Adults only)  Office Follow Up:  Does the office need to follow up with this patient?: No  Instructions For The Office: N/A  RN Note:  Wife calling for Tramadol maintenance dose refill d/t chronic arthritis and joint pain. Pt has app on 10-17 at 0915. Pt uses [facility], info in [place].  Discussed home care until RX can be called in or Pt is seen on 10-17.  Symptoms  Reason For Call & Symptoms: Wife calling for Tramadol maintenance dose refill d/t chronic arthritis and joint pain. Pt has app on 10-17 at 0915. [facility] uses [facility] [address]  Reviewed Health History In EMR: Yes  Reviewed Medications In EMR: Yes  Reviewed Allergies In EMR: Yes  Reviewed Surgeries / Procedures: Yes  Date of Onset of Symptoms: 06/07/2013  Treatments Tried: Acetminophen,  Treatments Tried Worked: No  Guideline(s) Used:  Knee Pain  Disposition Per Guideline:   See Within 3 Days in Office  Reason For Disposition Reached:   Moderate pain (e.g., symptoms interfere with work or school, limping) and present > 3 days  Advice Given:  Pain Medicines:  Naproxen (e.g., Aleve):  Take 220 mg (one 220 mg pill) by mouth every 8 hours as needed. You may take 440 mg (two 220 mg pills) for your first dose.  The most you should take each day is 660 mg (three 220 mg pills a day), unless your doctor has told you to take more.  Knee Pain after Overuse:   Apply Heat to the Area: Beginning 48 hours after an injury, apply a warm washcloth or heating pad for 10 minutes three times a day to help increase blood flow and improve healing.  Patient Will Follow Care Advice:  YES

## 2013-06-08 NOTE — Telephone Encounter (Signed)
This appears to be request for refill and not a prior authorization.

## 2013-06-10 ENCOUNTER — Encounter: Payer: Self-pay | Admitting: Internal Medicine

## 2013-06-10 ENCOUNTER — Ambulatory Visit (INDEPENDENT_AMBULATORY_CARE_PROVIDER_SITE_OTHER): Payer: Medicare Other | Admitting: Internal Medicine

## 2013-06-10 VITALS — BP 158/84 | HR 64 | Temp 97.9°F | Ht 68.0 in | Wt 167.0 lb

## 2013-06-10 DIAGNOSIS — I10 Essential (primary) hypertension: Secondary | ICD-10-CM

## 2013-06-10 DIAGNOSIS — K219 Gastro-esophageal reflux disease without esophagitis: Secondary | ICD-10-CM

## 2013-06-10 DIAGNOSIS — IMO0002 Reserved for concepts with insufficient information to code with codable children: Secondary | ICD-10-CM

## 2013-06-10 DIAGNOSIS — M1710 Unilateral primary osteoarthritis, unspecified knee: Secondary | ICD-10-CM

## 2013-06-10 DIAGNOSIS — Z23 Encounter for immunization: Secondary | ICD-10-CM

## 2013-06-10 DIAGNOSIS — E785 Hyperlipidemia, unspecified: Secondary | ICD-10-CM

## 2013-06-10 LAB — CBC WITH DIFFERENTIAL/PLATELET
Basophils Relative: 0.5 % (ref 0.0–3.0)
Eosinophils Absolute: 0.3 10*3/uL (ref 0.0–0.7)
Eosinophils Relative: 4.6 % (ref 0.0–5.0)
Lymphocytes Relative: 21.1 % (ref 12.0–46.0)
MCHC: 33.9 g/dL (ref 30.0–36.0)
Neutrophils Relative %: 65.8 % (ref 43.0–77.0)
Platelets: 207 10*3/uL (ref 150.0–400.0)
RBC: 3.96 Mil/uL — ABNORMAL LOW (ref 4.22–5.81)
RDW: 13.4 % (ref 11.5–14.6)
WBC: 6.9 10*3/uL (ref 4.5–10.5)

## 2013-06-10 LAB — BASIC METABOLIC PANEL
BUN: 25 mg/dL — ABNORMAL HIGH (ref 6–23)
Calcium: 9.2 mg/dL (ref 8.4–10.5)
Chloride: 104 mEq/L (ref 96–112)
Creatinine, Ser: 1.4 mg/dL (ref 0.4–1.5)
GFR: 50.83 mL/min — ABNORMAL LOW (ref >60.00)
Sodium: 140 mEq/L (ref 135–145)

## 2013-06-10 MED ORDER — OXYBUTYNIN CHLORIDE 5 MG PO TABS: 5.0000 mg | ORAL_TABLET | Freq: Every day | ORAL | Status: DC

## 2013-06-10 NOTE — Progress Notes (Signed)
Allergic rhinitis- zyrtec. He previously had "coughing spells"  Knee pain- using tramadol and voltaren gel-- fair results  Urinary incontinence-- initially was very good on oxybutinin, now sxs recurring, no dysuria  Home bp-- 130-160/75-100  Ros: fatigue Reviewed meds, pmh  Reviewed vitals Elderly male in no acute distress using a walker. Chest clear to auscultation. Cardiac exam S1-S2 are regular. Pretibial, Trace edema  Assessment and plan: Patient with knee pain. Continue current tramadol and Voltaren gel as necessary.  Urinary incontinence will increase oxybutynin.

## 2013-06-18 ENCOUNTER — Other Ambulatory Visit: Payer: Self-pay | Admitting: Internal Medicine

## 2013-07-15 ENCOUNTER — Encounter: Payer: Self-pay | Admitting: Internal Medicine

## 2013-07-18 ENCOUNTER — Ambulatory Visit (INDEPENDENT_AMBULATORY_CARE_PROVIDER_SITE_OTHER): Payer: Medicare Other | Admitting: Family Medicine

## 2013-07-18 ENCOUNTER — Encounter: Payer: Self-pay | Admitting: Family Medicine

## 2013-07-18 VITALS — BP 140/70 | HR 83 | Temp 97.3°F | Wt 173.0 lb

## 2013-07-18 DIAGNOSIS — J309 Allergic rhinitis, unspecified: Secondary | ICD-10-CM

## 2013-07-18 MED ORDER — TRIAMCINOLONE ACETONIDE 55 MCG/ACT NA AERO: 2.0000 | INHALATION_SPRAY | Freq: Every day | NASAL | Status: DC

## 2013-07-18 NOTE — Progress Notes (Signed)
Pre visit review using our clinic review tool, if applicable. No additional management support is needed unless otherwise documented below in the visit note. 

## 2013-07-18 NOTE — Progress Notes (Signed)
  Subjective:    Patient ID: Roger [name], male    DOB: 10-15-1919, 79 y.o.   MRN: [id number]  HPI Acute visit for cough. Onset several weeks/?months ago. Cough now productive.  No hemoptysis. No fever No dyspnea.  Some PND.  No recent appetite or weight changes. Frequent sneezing  Patient has history of perennial allergies. Currently takes Zyrtec once daily. Not using any nasal sprays. Denies any recent nosebleeds.  Past Medical History  Diagnosis Date  . CARCINOMA, SKIN, SQUAMOUS CELL 05/26/2008  . GERD 02/22/2007  . HYPERLIPIDEMIA 02/22/2007  . HYPERTENSION 06/17/2007  . MACULAR DEGENERATION 02/22/2007  . OSTEOARTHRITIS, HIP, RIGHT 06/05/2009   Past Surgical History  Procedure Laterality Date  . Laminectomy  1988    reports that he has quit smoking. He does not have any smokeless tobacco history on file. His alcohol and drug histories are not on file. family history includes Breast cancer in his daughter. Allergies  Allergen Reactions  . Amlodipine Besylate     REACTION: swelling of feet  . Aspirin     REACTION: GI upset can tolerate ecotrin  . Hydrocodone     GI upset      Review of Systems  HENT: Positive for congestion and postnasal drip. Negative for sinus pressure.   Respiratory: Positive for cough. Negative for shortness of breath and wheezing.        Objective:   Physical Exam  Constitutional: He appears well-developed and well-nourished.  HENT:  Nose: Nose normal.  Mouth/Throat: Oropharynx is clear and moist.  Eardrums partially obscured by cerumen  Cardiovascular: Normal rate.   Pulmonary/Chest: Effort normal and breath sounds normal. No respiratory distress. He has no wheezes. He has no rales.          Assessment & Plan:  Patient has some chronic perennial rhinitis. Probable allergic rhinitis. Zyrtec 1 daily. Add Nasacort AQ 2 sprays per nostril once daily.

## 2013-07-18 NOTE — Patient Instructions (Signed)
Allergic Rhinitis Allergic rhinitis is when the mucous membranes in the nose respond to allergens. Allergens are particles in the air that cause your body to have an allergic reaction. This causes you to release allergic antibodies. Through a chain of events, these eventually cause you to release histamine into the blood stream (hence the use of antihistamines). Although meant to be protective to the body, it is this release that causes your discomfort, such as frequent sneezing, congestion and an itchy runny nose.  CAUSES  The pollen allergens may come from grasses, trees, and weeds. This is seasonal allergic rhinitis, or "hay fever." Other allergens cause year-round allergic rhinitis (perennial allergic rhinitis) such as house dust mite allergen, pet dander and mold spores.  SYMPTOMS   Nasal stuffiness (congestion).  Runny, itchy nose with sneezing and tearing of the eyes.  There is often an itching of the mouth, eyes and ears. It cannot be cured, but it can be controlled with medications. DIAGNOSIS  If you are unable to determine the offending allergen, skin or blood testing may find it. TREATMENT   Avoid the allergen.  Medications and allergy shots (immunotherapy) can help.  Hay fever may often be treated with antihistamines in pill or nasal spray forms. Antihistamines block the effects of histamine. There are over-the-counter medicines that may help with nasal congestion and swelling around the eyes. Check with your caregiver before taking or giving this medicine. If the treatment above does not work, there are many new medications your caregiver can prescribe. Stronger medications may be used if initial measures are ineffective. Desensitizing injections can be used if medications and avoidance fails. Desensitization is when a patient is given ongoing shots until the body becomes less sensitive to the allergen. Make sure you follow up with your caregiver if problems continue. SEEK MEDICAL  CARE IF:   You develop fever (more than 100.5 F (38.1 C).  You develop a cough that does not stop easily (persistent).  You have shortness of breath.  You start wheezing.  Symptoms interfere with normal daily activities. Document Released: 05/06/2001 Document Revised: 11/03/2011 Document Reviewed: 11/15/2008 [employer] Patient Information 2014 [employer].  

## 2013-11-17 ENCOUNTER — Other Ambulatory Visit: Payer: Self-pay

## 2013-11-17 ENCOUNTER — Emergency Department (HOSPITAL_COMMUNITY): Payer: Medicare Other

## 2013-11-17 ENCOUNTER — Encounter (HOSPITAL_COMMUNITY): Payer: Self-pay | Admitting: Emergency Medicine

## 2013-11-17 ENCOUNTER — Observation Stay (HOSPITAL_COMMUNITY)
Admission: EM | Admit: 2013-11-17 | Discharge: 2013-11-18 | Disposition: A | Discharge status: Home / Self Care | Payer: Medicare Other | Attending: Internal Medicine | Admitting: Internal Medicine

## 2013-11-17 DIAGNOSIS — M179 Osteoarthritis of knee, unspecified: Secondary | ICD-10-CM

## 2013-11-17 DIAGNOSIS — K219 Gastro-esophageal reflux disease without esophagitis: Secondary | ICD-10-CM | POA: Diagnosis present

## 2013-11-17 DIAGNOSIS — IMO0002 Reserved for concepts with insufficient information to code with codable children: Secondary | ICD-10-CM

## 2013-11-17 DIAGNOSIS — Z87891 Personal history of nicotine dependence: Secondary | ICD-10-CM | POA: Insufficient documentation

## 2013-11-17 DIAGNOSIS — Z85828 Personal history of other malignant neoplasm of skin: Secondary | ICD-10-CM | POA: Insufficient documentation

## 2013-11-17 DIAGNOSIS — Z7982 Long term (current) use of aspirin: Secondary | ICD-10-CM | POA: Insufficient documentation

## 2013-11-17 DIAGNOSIS — T17308A Unspecified foreign body in larynx causing other injury, initial encounter: Secondary | ICD-10-CM

## 2013-11-17 DIAGNOSIS — Z79899 Other long term (current) drug therapy: Secondary | ICD-10-CM | POA: Insufficient documentation

## 2013-11-17 DIAGNOSIS — M1710 Unilateral primary osteoarthritis, unspecified knee: Secondary | ICD-10-CM

## 2013-11-17 DIAGNOSIS — E785 Hyperlipidemia, unspecified: Secondary | ICD-10-CM | POA: Diagnosis present

## 2013-11-17 DIAGNOSIS — C4490 Unspecified malignant neoplasm of skin, unspecified: Secondary | ICD-10-CM

## 2013-11-17 DIAGNOSIS — M1610 Unilateral primary osteoarthritis, unspecified hip: Secondary | ICD-10-CM

## 2013-11-17 DIAGNOSIS — Z043 Encounter for examination and observation following other accident: Secondary | ICD-10-CM | POA: Insufficient documentation

## 2013-11-17 DIAGNOSIS — H3530 Unspecified macular degeneration: Secondary | ICD-10-CM | POA: Insufficient documentation

## 2013-11-17 DIAGNOSIS — R55 Syncope and collapse: Principal | ICD-10-CM | POA: Diagnosis present

## 2013-11-17 DIAGNOSIS — M169 Osteoarthritis of hip, unspecified: Secondary | ICD-10-CM | POA: Insufficient documentation

## 2013-11-17 DIAGNOSIS — R6889 Other general symptoms and signs: Secondary | ICD-10-CM | POA: Insufficient documentation

## 2013-11-17 DIAGNOSIS — I10 Essential (primary) hypertension: Secondary | ICD-10-CM | POA: Diagnosis present

## 2013-11-17 LAB — COMPREHENSIVE METABOLIC PANEL
ALBUMIN: 3.7 g/dL (ref 3.5–5.2)
ALT: 13 U/L (ref 0–53)
AST: 23 U/L (ref 0–37)
Alkaline Phosphatase: 75 U/L (ref 39–117)
BUN: 26 mg/dL — ABNORMAL HIGH (ref 6–23)
CALCIUM: 9.3 mg/dL (ref 8.4–10.5)
CO2: 20 mEq/L (ref 19–32)
Chloride: 100 mEq/L (ref 96–112)
Creatinine, Ser: 1.22 mg/dL (ref 0.50–1.35)
GFR calc Af Amer: 57 mL/min — ABNORMAL LOW (ref >90)
GFR calc non Af Amer: 50 mL/min — ABNORMAL LOW (ref >90)
Glucose, Bld: 152 mg/dL — ABNORMAL HIGH (ref 70–99)
Potassium: 3.7 mEq/L (ref 3.7–5.3)
SODIUM: 139 meq/L (ref 137–147)
TOTAL PROTEIN: 7.2 g/dL (ref 6.0–8.3)
Total Bilirubin: 0.5 mg/dL (ref 0.3–1.2)

## 2013-11-17 LAB — PROTIME-INR
INR: 1 (ref 0.00–1.49)
Prothrombin Time: 13 seconds (ref 11.6–15.2)

## 2013-11-17 LAB — CBC WITH DIFFERENTIAL/PLATELET
BASOS PCT: 0 % (ref 0–1)
Basophils Absolute: 0 10*3/uL (ref 0.0–0.1)
EOS ABS: 0.2 10*3/uL (ref 0.0–0.7)
EOS PCT: 3 % (ref 0–5)
HCT: 37 % — ABNORMAL LOW (ref 39.0–52.0)
Hemoglobin: 12.6 g/dL — ABNORMAL LOW (ref 13.0–17.0)
Lymphocytes Relative: 23 % (ref 12–46)
Lymphs Abs: 2.1 10*3/uL (ref 0.7–4.0)
MCH: 31.7 pg (ref 26.0–34.0)
MCHC: 34.1 g/dL (ref 30.0–36.0)
MCV: 93.2 fL (ref 78.0–100.0)
Monocytes Absolute: 0.5 10*3/uL (ref 0.1–1.0)
Monocytes Relative: 6 % (ref 3–12)
Neutro Abs: 6.2 10*3/uL (ref 1.7–7.7)
Neutrophils Relative %: 68 % (ref 43–77)
PLATELETS: 156 10*3/uL (ref 150–400)
RBC: 3.97 MIL/uL — ABNORMAL LOW (ref 4.22–5.81)
RDW: 13.3 % (ref 11.5–15.5)
WBC: 9.1 10*3/uL (ref 4.0–10.5)

## 2013-11-17 LAB — APTT: APTT: 27 s (ref 24–37)

## 2013-11-17 LAB — TROPONIN I: Troponin I: <0.3 ng/mL (ref <0.30)

## 2013-11-17 MED ORDER — SODIUM CHLORIDE 0.9 % IV SOLN: 250.0000 mL | INTRAVENOUS | Status: DC | PRN

## 2013-11-17 MED ORDER — SODIUM CHLORIDE 0.9 % IJ SOLN
3.0000 mL | Freq: Two times a day (BID) | INTRAMUSCULAR | Status: DC
  Administered 2013-11-18: 3 mL via INTRAVENOUS

## 2013-11-17 MED ORDER — TRIAMCINOLONE ACETONIDE 55 MCG/ACT NA AERO: 2.0000 | INHALATION_SPRAY | Freq: Every day | NASAL | Status: DC

## 2013-11-17 MED ORDER — FLUTICASONE PROPIONATE 50 MCG/ACT NA SUSP
1.0000 | Freq: Every day | NASAL | Status: DC
  Administered 2013-11-18: 1 via NASAL
  Filled 2013-11-17: qty 16

## 2013-11-17 MED ORDER — SODIUM CHLORIDE 0.9 % IJ SOLN: 3.0000 mL | INTRAMUSCULAR | Status: DC | PRN

## 2013-11-17 NOTE — ED Provider Notes (Signed)
CSN: [id number]     Arrival date & time 11/17/13  1752 History   First MD Initiated Contact with Patient 11/17/13 1756     Chief Complaint  Patient presents with  . Fall     (Consider location/radiation/quality/duration/timing/severity/associated sxs/prior Treatment) Patient is a 80 y.o. male presenting with fall.  Fall    Pt brought to the ED from home after reported syncopal episode. Per EMS wife heard him fall, found him unresponsive at the table where he had been eating. She lowered him to the floor, EMS found him unresponsive and bradypneic. He was never hypoxic per EMS report, initially responsive to verbal stimulus then gradually returned to baseline. He is awake now, complaining of R knee pain which is chronic. States he remembers choking on a hot cross bun. States he has had nasal congestion recently that has made him choke. Denies any current CP or SOB.    Past Medical History  Diagnosis Date  . CARCINOMA, SKIN, SQUAMOUS CELL 05/26/2008  . GERD 02/22/2007  . HYPERLIPIDEMIA 02/22/2007  . HYPERTENSION 06/17/2007  . MACULAR DEGENERATION 02/22/2007  . OSTEOARTHRITIS, HIP, RIGHT 06/05/2009   Past Surgical History  Procedure Laterality Date  . Laminectomy  1988   Family History  Problem Relation Age of Onset  . Breast cancer Daughter    History  Substance Use Topics  . Smoking status: Former Research scientist (life sciences)  . Smokeless tobacco: Not on file  . Alcohol Use:     Review of Systems  All other systems reviewed and are negative except as noted in HPI.    Allergies  Amlodipine besylate; Aspirin; and Hydrocodone  Home Medications   Current Outpatient Rx  Name  Route  Sig  Dispense  Refill  . aspirin 325 MG EC tablet   Oral   Take 325 mg by mouth daily.           . cetirizine (ZYRTEC) 10 MG tablet   Oral   Take 1 tablet (10 mg total) by mouth daily.   30 tablet   11   . diclofenac sodium (VOLTAREN) 1 % GEL   Topical   Apply 2 g topically 4 (four) times daily.   100  g   3   . furosemide (LASIX) 20 MG tablet      TAKE 1 TABLET EVERY DAY   30 tablet   3   . losartan (COZAAR) 100 MG tablet      TAKE 1 TABLET BY MOUTH EVERY DAY   90 tablet   3   . lovastatin (MEVACOR) 40 MG tablet      TAKE 1 TABLET EVERY DAY   90 tablet   3   . oxybutynin (DITROPAN) 5 MG tablet   Oral   Take 1 tablet (5 mg total) by mouth daily.   90 tablet   3   . traMADol (ULTRAM) 50 MG tablet      TAKE 1 TO 2 TABLETS EVERY 6 HOURS AS NEEDED FOR PAIN   60 tablet   3   . triamcinolone (NASACORT AQ) 55 MCG/ACT AERO nasal inhaler   Nasal   Place 2 sprays into the nose daily.   1 Inhaler   12    There were no vitals taken for this visit. Physical Exam  Nursing note and vitals reviewed. Constitutional: He is oriented to person, place, and time. He appears well-developed and well-nourished.  HENT:  Head: Normocephalic and atraumatic.  Eyes: EOM are normal. Pupils are equal, round, and reactive  to light.  Neck:  In collar, no midline tenderness  Cardiovascular: Normal rate, normal heart sounds and intact distal pulses.   Pulmonary/Chest: Effort normal and breath sounds normal. No respiratory distress. He has no wheezes. He has no rales.  Abdominal: Bowel sounds are normal. He exhibits no distension. There is no tenderness.  Musculoskeletal: Normal range of motion. He exhibits no edema and no tenderness.  Neurological: He is alert and oriented to person, place, and time. He has normal strength. No cranial nerve deficit or sensory deficit.  Skin: Skin is warm and dry. No rash noted.  Psychiatric: He has a normal mood and affect.    ED Course  Procedures (including critical care time) Labs Review Labs Reviewed  CBC WITH DIFFERENTIAL - Abnormal; Notable for the following:    RBC 3.97 (*)    Hemoglobin 12.6 (*)    HCT 37.0 (*)    All other components within normal limits  COMPREHENSIVE METABOLIC PANEL - Abnormal; Notable for the following:    Glucose, Bld  152 (*)    BUN 26 (*)    GFR calc non Af Amer 50 (*)    GFR calc Af Amer 57 (*)    All other components within normal limits  TROPONIN I  PROTIME-INR  APTT  URINALYSIS, ROUTINE W REFLEX MICROSCOPIC   Imaging Review Ct Head Wo Contrast  11/17/2013   CLINICAL DATA:  Syncope.  Fall.  Unresponsive episode.  EXAM: CT HEAD WITHOUT CONTRAST  CT CERVICAL SPINE WITHOUT CONTRAST  TECHNIQUE: Multidetector CT imaging of the head and cervical spine was performed following the standard protocol without intravenous contrast. Multiplanar CT image reconstructions of the cervical spine were also generated.  COMPARISON:  Head MRI 12/11/2003  FINDINGS: CT HEAD FINDINGS  There is moderate cerebral atrophy, stable to minimally increased from prior MRI. Confluent periventricular white matter hypodensities are compatible with moderate chronic small vessel ischemic disease. There is no evidence of acute cortical infarct, mass, midline shift, intracranial hemorrhage, or extra-axial fluid collection. Prior left cataract surgery is noted. A small right maxillary sinus mucous retention cyst is noted. Mastoid air cells are clear.  CT CERVICAL SPINE FINDINGS  There is straightening of the cervical spine. There is grade 1 anterolisthesis of C4 on C5 and of C7 on T1, likely facet mediated. Prevertebral soft tissues are within normal limits. No acute cervical spine fracture is identified.  Advanced degenerative changes are present at C1-2. There is also severe disc space narrowing with associated osteophytosis at C5-6 and C6-7. Multilevel facet arthrosis is present, severe bilaterally at C2-3, on the left at C3-4, and on the right at C4-5. There is likely severe spinal stenosis at C5-6. There is moderate to severe neural foraminal stenosis on the left at C3-4, on the right at C4-5, on the left at C5-6, and on the right at C6-7.  Atherosclerotic calcification is noted involving the innominate and left subclavian arteries and carotid  bifurcations. Visualized lung apices demonstrate centrilobular emphysema.  IMPRESSION: 1. No evidence of acute intracranial abnormality. 2. Moderate chronic small vessel ischemic disease. 3. No acute osseous abnormality identified in the cervical spine. 4. Advanced multilevel degenerative disc disease and facet arthrosis.   Electronically Signed   By: Logan Bores   On: 11/17/2013 18:55   Ct Cervical Spine Wo Contrast  11/17/2013   CLINICAL DATA:  Syncope.  Fall.  Unresponsive episode.  EXAM: CT HEAD WITHOUT CONTRAST  CT CERVICAL SPINE WITHOUT CONTRAST  TECHNIQUE: Multidetector CT imaging of  the head and cervical spine was performed following the standard protocol without intravenous contrast. Multiplanar CT image reconstructions of the cervical spine were also generated.  COMPARISON:  Head MRI 12/11/2003  FINDINGS: CT HEAD FINDINGS  There is moderate cerebral atrophy, stable to minimally increased from prior MRI. Confluent periventricular white matter hypodensities are compatible with moderate chronic small vessel ischemic disease. There is no evidence of acute cortical infarct, mass, midline shift, intracranial hemorrhage, or extra-axial fluid collection. Prior left cataract surgery is noted. A small right maxillary sinus mucous retention cyst is noted. Mastoid air cells are clear.  CT CERVICAL SPINE FINDINGS  There is straightening of the cervical spine. There is grade 1 anterolisthesis of C4 on C5 and of C7 on T1, likely facet mediated. Prevertebral soft tissues are within normal limits. No acute cervical spine fracture is identified.  Advanced degenerative changes are present at C1-2. There is also severe disc space narrowing with associated osteophytosis at C5-6 and C6-7. Multilevel facet arthrosis is present, severe bilaterally at C2-3, on the left at C3-4, and on the right at C4-5. There is likely severe spinal stenosis at C5-6. There is moderate to severe neural foraminal stenosis on the left at C3-4, on  the right at C4-5, on the left at C5-6, and on the right at C6-7.  Atherosclerotic calcification is noted involving the innominate and left subclavian arteries and carotid bifurcations. Visualized lung apices demonstrate centrilobular emphysema.  IMPRESSION: 1. No evidence of acute intracranial abnormality. 2. Moderate chronic small vessel ischemic disease. 3. No acute osseous abnormality identified in the cervical spine. 4. Advanced multilevel degenerative disc disease and facet arthrosis.   Electronically Signed   By: Logan Bores   On: 11/17/2013 18:55   Dg Chest Port 1 View  11/17/2013   CLINICAL DATA:  Syncope.  Choking.  EXAM: PORTABLE CHEST - 1 VIEW  COMPARISON:  Chest x-ray 04/20/2005.  FINDINGS: Mediastinum and hilar structures are stable. Mild fullness is unchanged and consistent with prominent vasculature. Heart size normal. Bibasilar atelectasis and/or infiltrates noted. Poor inspiration. No pleural effusion or pneumothorax. Moderate gastric distention present.  IMPRESSION: 1. Poor inspiration with mild bibasilar atelectasis and/or pneumonia. 2. Moderate gastric distention. Abdominal series suggested for confirmation.   Electronically Signed   By: Marcello Moores  Register   On: 11/17/2013 18:26     EKG Interpretation None      MDM   Final diagnoses:  Syncope  Choking   Pt with ?choking spell and syncope with return to baseline mental status and unremarkable workup. Will admit for further eval.    Rayfield Beem B. Karle Starch, MD 11/18/13 0005

## 2013-11-17 NOTE — ED Notes (Signed)
Returned from ct scan 

## 2013-11-17 NOTE — H&P (Signed)
PCP:   Chancy Hurter, MD   Chief Complaint:  Choked and passed out  HPI: 80 yo male h/o htn, hld was in the bathroom eating a biscuit when it got stuck and he started choking.  He coughed and couldn't get up, so tried to yell out to his wife but really couldn't.  After couple minutes, he felt like he was going to pass out and didn't want to fall backward and hit his head on the bathtub so he leaned forward over the sink to try to break his fall.  His wife came to the bathroom and found him slumped over the sink unresponsive.  They had a landscaper in the yard working, the wife ran out to get him, he called 911 who instructed him to lay the pt on the floor and begin cpr.  He did so, and did chest compressions for a minute or so until EMS arrived.  Wife says during this time, he was barely breathing, and when cpr was done , there was nothing that was expelled from his throat but it seemed as if he was audibly breathing more.  EMS got him into the ambulance and suctioned him, and suctioned some food out.  By this time, the pt was responsive.  On arrival EMS reported his RR was 6, but with good pulse and good bp.  Wife also reports he never turned blue.  He never fell and hit his head.  Now in ED, patient is back to normal, with normal mentation.  He reports he has had some difficulty swallowing or "clearing his phlegm" for several months now and was recently started on an antihistamine and flonase by his pcp which he thought was helping.    Review of Systems:  Positive and negative as per HPI otherwise all other systems are negative  Past Medical History: Past Medical History  Diagnosis Date  . CARCINOMA, SKIN, SQUAMOUS CELL 05/26/2008  . GERD 02/22/2007  . HYPERLIPIDEMIA 02/22/2007  . HYPERTENSION 06/17/2007  . MACULAR DEGENERATION 02/22/2007  . OSTEOARTHRITIS, HIP, RIGHT 06/05/2009   Past Surgical History  Procedure Laterality Date  . Laminectomy  1988    Medications: Prior to Admission  medications   Medication Sig Start Date End Date Taking? Authorizing Provider  aspirin 325 MG EC tablet Take 325 mg by mouth daily.      Historical Provider, MD  cetirizine (ZYRTEC) 10 MG tablet Take 1 tablet (10 mg total) by mouth daily. 02/11/13   Lisabeth Pick, MD  diclofenac sodium (VOLTAREN) 1 % GEL Apply 2 g topically 4 (four) times daily. 02/11/13   Lisabeth Pick, MD  furosemide (LASIX) 20 MG tablet TAKE 1 TABLET EVERY DAY 06/18/13   Lisabeth Pick, MD  losartan (COZAAR) 100 MG tablet TAKE 1 TABLET BY MOUTH EVERY DAY 05/03/13   Lisabeth Pick, MD  lovastatin (MEVACOR) 40 MG tablet TAKE 1 TABLET EVERY DAY 04/12/13   Lisabeth Pick, MD  oxybutynin (DITROPAN) 5 MG tablet Take 1 tablet (5 mg total) by mouth daily. 06/10/13   Lisabeth Pick, MD  traMADol (ULTRAM) 50 MG tablet TAKE 1 TO 2 TABLETS EVERY 6 HOURS AS NEEDED FOR PAIN 06/06/13   Lisabeth Pick, MD  triamcinolone (NASACORT AQ) 55 MCG/ACT AERO nasal inhaler Place 2 sprays into the nose daily. 07/18/13   Eulas Post, MD    Allergies:   Allergies  Allergen Reactions  . Amlodipine Besylate     REACTION: swelling of feet  .  Aspirin     REACTION: GI upset can tolerate ecotrin  . Hydrocodone     GI upset    Social History:  reports that he has quit smoking. He does not have any smokeless tobacco history on file. His alcohol and drug histories are not on file.  Family History: Family History  Problem Relation Age of Onset  . Breast cancer Daughter     Physical Exam: Filed Vitals:   11/17/13 1800 11/17/13 1935 11/17/13 2000  BP: 137/83  116/74  Pulse: 91  91  Temp:  97.4 F (36.3 C)   TempSrc:  Oral   Resp: 24  18  SpO2: 94%  95%   General appearance: alert, cooperative and no distress Head: Normocephalic, without obvious abnormality, atraumatic Eyes: negative Nose: Nares normal. Septum midline. Mucosa normal. No drainage or sinus tenderness. Neck: no JVD and supple, symmetrical, trachea midline Lungs: clear to  auscultation bilaterally Heart: regular rate and rhythm, S1, S2 normal, no murmur, click, rub or gallop Abdomen: soft, non-tender; bowel sounds normal; no masses,  no organomegaly Extremities: extremities normal, atraumatic, no cyanosis or edema Pulses: 2+ and symmetric Skin: Skin color, texture, turgor normal. No rashes or lesions Neurologic: Grossly normal    Labs on Admission:   Recent Labs  11/17/13 1811  NA 139  K 3.7  CL 100  CO2 20  GLUCOSE 152*  BUN 26*  CREATININE 1.22  CALCIUM 9.3    Recent Labs  11/17/13 1811  AST 23  ALT 13  ALKPHOS 75  BILITOT 0.5  PROT 7.2  ALBUMIN 3.7    Recent Labs  11/17/13 1811  WBC 9.1  NEUTROABS 6.2  HGB 12.6*  HCT 37.0*  MCV 93.2  PLT 156    Recent Labs  11/17/13 1811  TROPONINI <0.30   Radiological Exams on Admission: Ct Head Wo Contrast  11/17/2013   CLINICAL DATA:  Syncope.  Fall.  Unresponsive episode.  EXAM: CT HEAD WITHOUT CONTRAST  CT CERVICAL SPINE WITHOUT CONTRAST  TECHNIQUE: Multidetector CT imaging of the head and cervical spine was performed following the standard protocol without intravenous contrast. Multiplanar CT image reconstructions of the cervical spine were also generated.  COMPARISON:  Head MRI 12/11/2003  FINDINGS: CT HEAD FINDINGS  There is moderate cerebral atrophy, stable to minimally increased from prior MRI. Confluent periventricular white matter hypodensities are compatible with moderate chronic small vessel ischemic disease. There is no evidence of acute cortical infarct, mass, midline shift, intracranial hemorrhage, or extra-axial fluid collection. Prior left cataract surgery is noted. A small right maxillary sinus mucous retention cyst is noted. Mastoid air cells are clear.  CT CERVICAL SPINE FINDINGS  There is straightening of the cervical spine. There is grade 1 anterolisthesis of C4 on C5 and of C7 on T1, likely facet mediated. Prevertebral soft tissues are within normal limits. No acute  cervical spine fracture is identified.  Advanced degenerative changes are present at C1-2. There is also severe disc space narrowing with associated osteophytosis at C5-6 and C6-7. Multilevel facet arthrosis is present, severe bilaterally at C2-3, on the left at C3-4, and on the right at C4-5. There is likely severe spinal stenosis at C5-6. There is moderate to severe neural foraminal stenosis on the left at C3-4, on the right at C4-5, on the left at C5-6, and on the right at C6-7.  Atherosclerotic calcification is noted involving the innominate and left subclavian arteries and carotid bifurcations. Visualized lung apices demonstrate centrilobular emphysema.  IMPRESSION: 1. No  evidence of acute intracranial abnormality. 2. Moderate chronic small vessel ischemic disease. 3. No acute osseous abnormality identified in the cervical spine. 4. Advanced multilevel degenerative disc disease and facet arthrosis.   Electronically Signed   By: Logan Bores   On: 11/17/2013 18:55   Ct Cervical Spine Wo Contrast  11/17/2013   CLINICAL DATA:  Syncope.  Fall.  Unresponsive episode.  EXAM: CT HEAD WITHOUT CONTRAST  CT CERVICAL SPINE WITHOUT CONTRAST  TECHNIQUE: Multidetector CT imaging of the head and cervical spine was performed following the standard protocol without intravenous contrast. Multiplanar CT image reconstructions of the cervical spine were also generated.  COMPARISON:  Head MRI 12/11/2003  FINDINGS: CT HEAD FINDINGS  There is moderate cerebral atrophy, stable to minimally increased from prior MRI. Confluent periventricular white matter hypodensities are compatible with moderate chronic small vessel ischemic disease. There is no evidence of acute cortical infarct, mass, midline shift, intracranial hemorrhage, or extra-axial fluid collection. Prior left cataract surgery is noted. A small right maxillary sinus mucous retention cyst is noted. Mastoid air cells are clear.  CT CERVICAL SPINE FINDINGS  There is  straightening of the cervical spine. There is grade 1 anterolisthesis of C4 on C5 and of C7 on T1, likely facet mediated. Prevertebral soft tissues are within normal limits. No acute cervical spine fracture is identified.  Advanced degenerative changes are present at C1-2. There is also severe disc space narrowing with associated osteophytosis at C5-6 and C6-7. Multilevel facet arthrosis is present, severe bilaterally at C2-3, on the left at C3-4, and on the right at C4-5. There is likely severe spinal stenosis at C5-6. There is moderate to severe neural foraminal stenosis on the left at C3-4, on the right at C4-5, on the left at C5-6, and on the right at C6-7.  Atherosclerotic calcification is noted involving the innominate and left subclavian arteries and carotid bifurcations. Visualized lung apices demonstrate centrilobular emphysema.  IMPRESSION: 1. No evidence of acute intracranial abnormality. 2. Moderate chronic small vessel ischemic disease. 3. No acute osseous abnormality identified in the cervical spine. 4. Advanced multilevel degenerative disc disease and facet arthrosis.   Electronically Signed   By: Logan Bores   On: 11/17/2013 18:55   Dg Chest Port 1 View  11/17/2013   CLINICAL DATA:  Syncope.  Choking.  EXAM: PORTABLE CHEST - 1 VIEW  COMPARISON:  Chest x-ray 04/20/2005.  FINDINGS: Mediastinum and hilar structures are stable. Mild fullness is unchanged and consistent with prominent vasculature. Heart size normal. Bibasilar atelectasis and/or infiltrates noted. Poor inspiration. No pleural effusion or pneumothorax. Moderate gastric distention present.  IMPRESSION: 1. Poor inspiration with mild bibasilar atelectasis and/or pneumonia. 2. Moderate gastric distention. Abdominal series suggested for confirmation.   Electronically Signed   By: Marcello Moores  Register   On: 11/17/2013 18:26    Assessment/Plan  80 yo male with choking resulting in syncopal episode  Principal Problem:   Syncope- from  choking.  Tele monitoring.  Neuro cks overnight but this sounds due to choking.  Will also ck MBSS as it sounds like he has been possibly aspirating.  Monitor for development of aspiration pna.  Will repeat 2v cxr in am, no fever wbc nml at this time and lung are clear.  Will hold off on abx at this time.    Active Problems:   Hyperlipidemia   Hypertension   GERD (gastroesophageal reflux disease)    Resa Rinks A 11/17/2013, 9:23 PM

## 2013-11-17 NOTE — ED Notes (Signed)
Pt here from home after sustaining a fall after choking on some food bread , upon EMS arrival pt resp rate was 6 tto 8 , rate increased on the way to the hospital , upon arrival pt is alert and oriented times 3 now only complaint is knee pain

## 2013-11-18 ENCOUNTER — Observation Stay (HOSPITAL_COMMUNITY): Payer: Medicare Other

## 2013-11-18 ENCOUNTER — Telehealth: Payer: Self-pay

## 2013-11-18 MED ORDER — KETOROLAC TROMETHAMINE 30 MG/ML IJ SOLN
30.0000 mg | Freq: Once | INTRAMUSCULAR | Status: AC
  Administered 2013-11-18: 30 mg via INTRAVENOUS
  Filled 2013-11-18: qty 1

## 2013-11-18 MED ORDER — KETOROLAC TROMETHAMINE 15 MG/ML IJ SOLN
15.0000 mg | Freq: Once | INTRAMUSCULAR | Status: AC
  Administered 2013-11-18: 15 mg via INTRAVENOUS
  Filled 2013-11-18: qty 1

## 2013-11-18 MED ORDER — SODIUM CHLORIDE 0.9 % IV SOLN
INTRAVENOUS | Status: DC
  Administered 2013-11-18: 10:00:00 via INTRAVENOUS

## 2013-11-18 NOTE — Procedures (Signed)
Objective Swallowing Evaluation: Modified Barium Swallowing Study  Patient Details  Name: Roger [name] MRN: [id number] Date of Birth: 1920/03/28  Today's Date: 11/18/2013 Time: 1310-1345 SLP Time Calculation (min): 35 min  Past Medical History:  Past Medical History  Diagnosis Date  . CARCINOMA, SKIN, SQUAMOUS CELL 05/26/2008  . GERD 02/22/2007  . HYPERLIPIDEMIA 02/22/2007  . HYPERTENSION 06/17/2007  . MACULAR DEGENERATION 02/22/2007  . OSTEOARTHRITIS, HIP, RIGHT 06/05/2009   Past Surgical History:  Past Surgical History  Procedure Laterality Date  . Laminectomy  19886   HPI:  80 year old male admitted 11/17/13 after choking on a hot cross bun.  PMH significant for GERD.  Pt reports accumulation of "phlegm" which is what interfered with swallow function/safety yesterday.  No history of dysphagia prior to admit.     Assessment / Plan / Recommendation Clinical Impression  Dysphagia Diagnosis: Within Functional Limits Clinical impression: Pt presents with mild pharyngeal dysphagia, largely due to what appears to be osteophytes at C5/C6. (Radiologist was not present to confirm this). This caused a decrease of epilgottic deflection which consequently resulted in vallecular residue and incomplete airway closure.  Chin tuck and dry swallow were ineffective in clearing residue, but alternating solid and liquid and dry swallows were effective. Subepiglottic penetration occurred on thin liquids, but was cleared with throat clear and was not aspirated. Barium tablet was tolerated well, given with water.  Recommend regular diet, but to have solids taken in small bites, one at a time. Thin liquids via cup or straw are safe as well. Medications whole with liquid, one at a time.    Treatment Recommendation  No treatment recommended at this time    Diet Recommendation Regular;Thin liquid   Liquid Administration via: Cup;Straw Medication Administration: Whole meds with liquid Supervision: Patient  able to self feed Compensations: Slow rate;Small sips/bites;Follow solids with liquid Postural Changes and/or Swallow Maneuvers: Seated upright 90 degrees    Other  Recommendations Recommended Consults: MBS Oral Care Recommendations: Oral care BID   Follow Up Recommendations  None    Frequency and Duration   n/a     Pertinent Vitals/Pain VSS, no pain reported    SLP Swallow Goals     General Date of Onset: 11/17/13 HPI: 80 year old male admitted 11/17/13 after choking on a hot cross bun.  PMH significant for GERD.  Pt reports accumulation of "phlegm" which is what interfered with swallow function/safety yesterday.  No history of dysphagia prior to admit. Type of Study: Modified Barium Swallowing Study Reason for Referral: Objectively evaluate swallowing function Previous Swallow Assessment: BSE this morning Diet Prior to this Study: NPO Temperature Spikes Noted: Yes Respiratory Status: Room air History of Recent Intubation: No Behavior/Cognition: Alert;Cooperative;Hard of hearing;Pleasant mood Oral Cavity - Dentition: Adequate natural dentition Oral Motor / Sensory Function: Within functional limits Self-Feeding Abilities: Able to feed self Patient Positioning: Upright in chair Baseline Vocal Quality: Clear Volitional Cough: Strong Volitional Swallow: Able to elicit Anatomy: Within functional limits Pharyngeal Secretions: Not observed secondary MBS    Reason for Referral Objectively evaluate swallowing function   Oral Phase Oral Preparation/Oral Phase Oral Phase: WFL   Pharyngeal Phase Pharyngeal Phase Pharyngeal Phase: Impaired Pharyngeal - Thin Pharyngeal - Thin Cup: Delayed swallow initiation;Premature spillage to valleculae;Reduced epiglottic inversion;Pharyngeal residue - valleculae;Pharyngeal residue - pyriform sinuses Pharyngeal - Thin Straw: Delayed swallow initiation;Premature spillage to valleculae;Reduced epiglottic inversion Pharyngeal - Solids Pharyngeal -  Puree: Delayed swallow initiation;Premature spillage to valleculae;Reduced epiglottic inversion;Pharyngeal residue - valleculae;Pharyngeal residue -  pyriform sinuses;Compensatory strategies attempted (Comment) (chin tuck ineffective in eliminating vallecular and pyriform residue.) Pharyngeal - Multi-consistency: Delayed swallow initiation;Premature spillage to valleculae;Reduced epiglottic inversion;Pharyngeal residue - valleculae;Pharyngeal residue - pyriform sinuses Pharyngeal - Pill: Delayed swallow initiation;Premature spillage to valleculae;Reduced epiglottic inversion  Cervical Esophageal Phase    GO    Cervical Esophageal Phase Cervical Esophageal Phase: [facility]    Functional Assessment Tool Used: ASHA NOMS Functional Limitations: Swallowing Swallow Current Status (X3818): At least 1 percent but less than 20 percent impaired, limited or restricted Swallow Goal Status [phone]): At least 1 percent but less than 20 percent impaired, limited or restricted Swallow Discharge Status [phone]): At least 1 percent but less than 20 percent impaired, limited or restricted   Celia B. Quentin Ore [facility], CCC-SLP [phone]  Shonna Chock 11/18/2013, 3:15 PM

## 2013-11-18 NOTE — Progress Notes (Signed)
[facility] PROGRESS NOTE  Roger [name] WUJ:[id number] DOB: 1920-05-25 DOA: 11/17/2013 PCP: Chancy Hurter, MD  Assessment/Plan: 80 y/o male with PMH of HTN, GERD, HPL had syncope, respiratory failure after choking, had some chest compression performed by friend prior to EMS arrival; per EMS patient was bradypnea but never hypoxic   1. Syncope, unresponsiveness with choking food;  -resolved; neuro exam no focal; CT head: no acute findings; pend swallow test;  -CXR: Poor inspiration with mild bibasilar atelectasis; no s/s of pneumonia; repeat CXR; monitor off atx    2. HTN hodlign meds while NPO; BP stable off meds;   Code Status: full Family Communication:  D/w patient (indicate person spoken with, relationship, and if by phone, the number) Disposition Plan: home 24-48 hours    Consultants:  None   Procedures:  MBBS pend   Antibiotics:  None  (indicate start date, and stop date if known)  HPI/Subjective: alert  Objective: Filed Vitals:   11/18/13 0842  BP: 113/59  Pulse: 68  Temp: 97.9 F (36.6 C)  Resp: 18    Intake/Output Summary (Last 24 hours) at 11/18/13 0936 Last data filed at 11/18/13 0600  Gross per 24 hour  Intake      0 ml  Output    150 ml  Net   -150 ml   Filed Weights   11/17/13 2239  Weight: 74.662 kg (164 lb 9.6 oz)    Exam:   General:  alert  Cardiovascular: s1,s2 rrr  Respiratory: CTA BL  Abdomen: soft, nt,nd   Musculoskeletal: mild edema, chronic    Data Reviewed: Basic Metabolic Panel:  Recent Labs Lab 11/17/13 1811  NA 139  K 3.7  CL 100  CO2 20  GLUCOSE 152*  BUN 26*  CREATININE 1.22  CALCIUM 9.3   Liver Function Tests:  Recent Labs Lab 11/17/13 1811  AST 23  ALT 13  ALKPHOS 75  BILITOT 0.5  PROT 7.2  ALBUMIN 3.7   No results found for this basename: LIPASE, AMYLASE,  in the last 168 hours No results found for this basename: AMMONIA,  in the last 168 hours CBC:  Recent Labs Lab  11/17/13 1811  WBC 9.1  NEUTROABS 6.2  HGB 12.6*  HCT 37.0*  MCV 93.2  PLT 156   Cardiac Enzymes:  Recent Labs Lab 11/17/13 1811  TROPONINI <0.30   BNP (last 3 results) No results found for this basename: PROBNP,  in the last 8760 hours CBG: No results found for this basename: GLUCAP,  in the last 168 hours  No results found for this or any previous visit (from the past 240 hour(s)).   Studies: Ct Head Wo Contrast  11/17/2013   CLINICAL DATA:  Syncope.  Fall.  Unresponsive episode.  EXAM: CT HEAD WITHOUT CONTRAST  CT CERVICAL SPINE WITHOUT CONTRAST  TECHNIQUE: Multidetector CT imaging of the head and cervical spine was performed following the standard protocol without intravenous contrast. Multiplanar CT image reconstructions of the cervical spine were also generated.  COMPARISON:  Head MRI 12/11/2003  FINDINGS: CT HEAD FINDINGS  There is moderate cerebral atrophy, stable to minimally increased from prior MRI. Confluent periventricular white matter hypodensities are compatible with moderate chronic small vessel ischemic disease. There is no evidence of acute cortical infarct, mass, midline shift, intracranial hemorrhage, or extra-axial fluid collection. Prior left cataract surgery is noted. A small right maxillary sinus mucous retention cyst is noted. Mastoid air cells are clear.  CT CERVICAL SPINE FINDINGS  There is  straightening of the cervical spine. There is grade 1 anterolisthesis of C4 on C5 and of C7 on T1, likely facet mediated. Prevertebral soft tissues are within normal limits. No acute cervical spine fracture is identified.  Advanced degenerative changes are present at C1-2. There is also severe disc space narrowing with associated osteophytosis at C5-6 and C6-7. Multilevel facet arthrosis is present, severe bilaterally at C2-3, on the left at C3-4, and on the right at C4-5. There is likely severe spinal stenosis at C5-6. There is moderate to severe neural foraminal stenosis on  the left at C3-4, on the right at C4-5, on the left at C5-6, and on the right at C6-7.  Atherosclerotic calcification is noted involving the innominate and left subclavian arteries and carotid bifurcations. Visualized lung apices demonstrate centrilobular emphysema.  IMPRESSION: 1. No evidence of acute intracranial abnormality. 2. Moderate chronic small vessel ischemic disease. 3. No acute osseous abnormality identified in the cervical spine. 4. Advanced multilevel degenerative disc disease and facet arthrosis.   Electronically Signed   By: Logan Bores   On: 11/17/2013 18:55   Ct Cervical Spine Wo Contrast  11/17/2013   CLINICAL DATA:  Syncope.  Fall.  Unresponsive episode.  EXAM: CT HEAD WITHOUT CONTRAST  CT CERVICAL SPINE WITHOUT CONTRAST  TECHNIQUE: Multidetector CT imaging of the head and cervical spine was performed following the standard protocol without intravenous contrast. Multiplanar CT image reconstructions of the cervical spine were also generated.  COMPARISON:  Head MRI 12/11/2003  FINDINGS: CT HEAD FINDINGS  There is moderate cerebral atrophy, stable to minimally increased from prior MRI. Confluent periventricular white matter hypodensities are compatible with moderate chronic small vessel ischemic disease. There is no evidence of acute cortical infarct, mass, midline shift, intracranial hemorrhage, or extra-axial fluid collection. Prior left cataract surgery is noted. A small right maxillary sinus mucous retention cyst is noted. Mastoid air cells are clear.  CT CERVICAL SPINE FINDINGS  There is straightening of the cervical spine. There is grade 1 anterolisthesis of C4 on C5 and of C7 on T1, likely facet mediated. Prevertebral soft tissues are within normal limits. No acute cervical spine fracture is identified.  Advanced degenerative changes are present at C1-2. There is also severe disc space narrowing with associated osteophytosis at C5-6 and C6-7. Multilevel facet arthrosis is present, severe  bilaterally at C2-3, on the left at C3-4, and on the right at C4-5. There is likely severe spinal stenosis at C5-6. There is moderate to severe neural foraminal stenosis on the left at C3-4, on the right at C4-5, on the left at C5-6, and on the right at C6-7.  Atherosclerotic calcification is noted involving the innominate and left subclavian arteries and carotid bifurcations. Visualized lung apices demonstrate centrilobular emphysema.  IMPRESSION: 1. No evidence of acute intracranial abnormality. 2. Moderate chronic small vessel ischemic disease. 3. No acute osseous abnormality identified in the cervical spine. 4. Advanced multilevel degenerative disc disease and facet arthrosis.   Electronically Signed   By: Logan Bores   On: 11/17/2013 18:55   Dg Chest Port 1 View  11/17/2013   CLINICAL DATA:  Syncope.  Choking.  EXAM: PORTABLE CHEST - 1 VIEW  COMPARISON:  Chest x-ray 04/20/2005.  FINDINGS: Mediastinum and hilar structures are stable. Mild fullness is unchanged and consistent with prominent vasculature. Heart size normal. Bibasilar atelectasis and/or infiltrates noted. Poor inspiration. No pleural effusion or pneumothorax. Moderate gastric distention present.  IMPRESSION: 1. Poor inspiration with mild bibasilar atelectasis and/or pneumonia. 2. Moderate  gastric distention. Abdominal series suggested for confirmation.   Electronically Signed   By: Marcello Moores  Register   On: 11/17/2013 18:26    Scheduled Meds: . fluticasone  1 spray Each Nare Daily  . ketorolac  15 mg Intravenous Once  . sodium chloride  3 mL Intravenous Q12H  . sodium chloride  3 mL Intravenous Q12H   Continuous Infusions:   Principal Problem:   Syncope Active Problems:   Hyperlipidemia   Hypertension   GERD (gastroesophageal reflux disease)    Time spent: >35 minutes     Kinnie Feil  [facility] Pager [phone]. If 7PM-7AM, please contact night-coverage at [email or web address], password [facility] 11/18/2013, 9:36 AM  LOS:  1 day

## 2013-11-18 NOTE — Evaluation (Signed)
Clinical/Bedside Swallow Evaluation Patient Details  Name: Roger [name] MRN: [id number] Date of Birth: 23-Aug-1920  Today's Date: 11/18/2013 Time: 0930-1010 SLP Time Calculation (min): 40 min  Past Medical History:  Past Medical History  Diagnosis Date  . CARCINOMA, SKIN, SQUAMOUS CELL 05/26/2008  . GERD 02/22/2007  . HYPERLIPIDEMIA 02/22/2007  . HYPERTENSION 06/17/2007  . MACULAR DEGENERATION 02/22/2007  . OSTEOARTHRITIS, HIP, RIGHT 06/05/2009   Past Surgical History:  Past Surgical History  Procedure Laterality Date  . Laminectomy  19874   HPI:  80 year old male admitted 11/17/13 after choking on a hot cross bun.  PMH significant for GERD.  Pt reports accumulation of "phlegm" which is what interfered with swallow function/safety yesterday.  No history of dysphagia prior to admit.   Assessment / Plan / Recommendation Clinical Impression  Pt completed oral care after setup. Oral motor strength and function appear adequate.  No overt s/s aspiration observed.  Pt does exhibit several symptoms which raise suspicion for LPR (excessive mucus, raspy voice quality), and has already been dx with GERD.  Will proceed with MBS this afternoon to abjectively assess swallow function and safety.    Aspiration Risk  Mild    Diet Recommendation Dysphagia 3 (Mechanical Soft);Thin liquid   Liquid Administration via: Cup;Straw Medication Administration: Whole meds with liquid Supervision: Patient able to self feed Compensations: Slow rate;Small sips/bites Postural Changes and/or Swallow Maneuvers: Seated upright 90 degrees;Upright 30-60 min after meal    Other  Recommendations Recommended Consults: MBS Oral Care Recommendations: Oral care BID   Follow Up Recommendations   (TBD)    Frequency and Duration        Pertinent Vitals/Pain VSS, no pain reported    SLP Swallow Goals  to be determined following MBS   Swallow Study Prior Functional Status   No history of dysphagia. Pt  tolerating regular diet with thin liquids prior to admit.    General Date of Onset: 11/17/13 HPI: 80 year old male admitted 11/17/13 after choking on a hot cross bun.  PMH significant for GERD.  Pt reports accumulation of "phlegm" which is what interfered with swallow function/safety yesterday.  No history of dysphagia prior to admit. Type of Study: Bedside swallow evaluation Previous Swallow Assessment: n/a Diet Prior to this Study: NPO Temperature Spikes Noted: No Respiratory Status: Room air History of Recent Intubation: No Behavior/Cognition: Alert;Cooperative;Hard of hearing Oral Cavity - Dentition: Adequate natural dentition Self-Feeding Abilities: Able to feed self Patient Positioning: Upright in bed Baseline Vocal Quality: Clear Volitional Cough: Strong Volitional Swallow: Able to elicit    Oral/Motor/Sensory Function Overall Oral Motor/Sensory Function: Appears within functional limits for tasks assessed   Ice Chips Ice chips: Not tested   Thin Liquid Thin Liquid: Within functional limits Presentation: Straw;Cup    Nectar Thick Nectar Thick Liquid: Not tested   Honey Thick Honey Thick Liquid: Not tested   Puree Puree: Within functional limits Presentation: Spoon   Solid   GO Functional Assessment Tool Used: ASHA NOMS Functional Limitations: Swallowing Swallow Current Status (K7425): At least 1 percent but less than 20 percent impaired, limited or restricted Swallow Goal Status [phone]): At least 1 percent but less than 20 percent impaired, limited or restricted Swallow Discharge Status [phone]): At least 1 percent but less than 20 percent impaired, limited or restricted  Solid: Within functional limits Presentation: [place] [facility] Quentin Ore [facility] [place] [phone]  Shonna Chock 11/18/2013,11:47 AM

## 2013-11-18 NOTE — Telephone Encounter (Signed)
Pt wife called to let us know her husband was in hospital @ [facility] and wanted Dr Leanne Chang to check in on him

## 2013-11-18 NOTE — Progress Notes (Signed)
Pt c/o rt knee pain. Knee does have some swelling.  Pt states he takes tramadol at home for the pain but he is NPO here until seen by speech therapy. Pt also states pain is not usually this bad at home. Chaney Malling, NP notified and orders placed. Pt medicated and knee elevated on a pillow with an ice pack. Will cont to monitor.

## 2013-11-18 NOTE — Progress Notes (Signed)
Utilization review completed.  

## 2013-11-18 NOTE — Progress Notes (Signed)
D/c orders received;IV removed with gauze on, pt remains in stable condition, pt meds and instructions reviewed and given to pt and pts family; pt d/c to home

## 2013-11-18 NOTE — Discharge Summary (Signed)
Physician Discharge Summary  Roger [name] [facility] [id number] DOB: June 06, 1920 DOA: 11/17/2013  PCP: Chancy Hurter, MD  Admit date: 11/17/2013 Discharge date: 11/18/2013  Time spent: >35 minutes  Recommendations for Outpatient Follow-up:  F/u with PCP in 1-2 weeks  Discharge Diagnoses:  Principal Problem:   Syncope Active Problems:   Hyperlipidemia   Hypertension   GERD (gastroesophageal reflux disease)   Discharge Condition: stable   Diet recommendation: heart healthy   Filed Weights   11/17/13 2239  Weight: 74.662 kg (164 lb 9.6 oz)    History of present illness:    Hospital Course:  80 y/o male with PMH of HTN, GERD, HPL had syncope, respiratory failure after choking, had some chest compression performed by friend prior to EMS arrival; per EMS patient was bradypnea but never hypoxic   1. Syncope, unresponsiveness with choking food;  -resolved; neuro exam no focal; CT head: no acute findings; repeat CXR x 2 no clear infiltrate; no s/s of pneumonia or aspiration -s/p MBBS: no aspiration; educated about aspiration precautions  2. HTN resume home meds  Procedures:  none (i.e. Studies not automatically included, echos, thoracentesis, etc; not x-rays)  Consultations:  None   Discharge Exam: Filed Vitals:   11/18/13 1500  BP: 104/52  Pulse: 74  Temp: 97.6 F (36.4 C)  Resp: 18    General: alert Cardiovascular: s1,s2 rrr Respiratory: CTA BL  Discharge Instructions  Discharge Orders   Future Appointments Provider Department Dept Phone   12/13/2013 9:00 AM Lisabeth Pick, MD [place] [facility] [place]   Future Orders Complete By Expires   Diet - low sodium heart healthy  As directed    Discharge instructions  As directed    Comments:     Please follow up with primary care doctor in 1-2 weeks       Medication List         aspirin 325 MG EC tablet  Take 325 mg by mouth daily.     cetirizine 10 MG tablet  Commonly known as:   ZYRTEC  Take 1 tablet (10 mg total) by mouth daily.     furosemide 20 MG tablet  Commonly known as:  LASIX  TAKE 1 TABLET EVERY DAY     losartan 100 MG tablet  Commonly known as:  COZAAR  TAKE 1 TABLET BY MOUTH EVERY DAY     lovastatin 40 MG tablet  Commonly known as:  MEVACOR  TAKE 1 TABLET EVERY DAY     oxybutynin 5 MG tablet  Commonly known as:  DITROPAN  Take 1 tablet (5 mg total) by mouth daily.     traMADol 50 MG tablet  Commonly known as:  ULTRAM  TAKE 1 TO 2 TABLETS EVERY 6 HOURS AS NEEDED FOR PAIN     triamcinolone 55 MCG/ACT Aero nasal inhaler  Commonly known as:  NASACORT AQ  Place 2 sprays into the nose daily.       Allergies  Allergen Reactions  . Amlodipine Besylate     REACTION: swelling of feet  . Aspirin     REACTION: GI upset can tolerate ecotrin  . Hydrocodone     GI upset       Follow-up Information   Follow up with Chancy Hurter, MD In 1 week.   Specialties:  Internal Medicine, Radiology   Contact information:   [place] [id number] [phone]        The results of significant diagnostics from this hospitalization (  including imaging, microbiology, ancillary and laboratory) are listed below for reference.    Significant Diagnostic Studies: Dg Chest 2 View  11/18/2013   CLINICAL DATA:  Shortness of Breath  EXAM: CHEST  2 VIEW  COMPARISON:  11/17/2013  FINDINGS: AP and lateral views of the chest show no evidence for dense focal airspace consolidation. No overt pulmonary edema. There is some bibasilar atelectasis with associated vascular congestion. The cardio pericardial silhouette is enlarged. Contrast material is visible in the fundus of the stomach. Bones are diffusely demineralized. Telemetry leads overlie the chest.  IMPRESSION: Low volume film with basilar atelectasis and vascular congestion.   Electronically Signed   By: Misty Stanley M.D.   On: 11/18/2013 14:14   Ct Head Wo Contrast  11/17/2013    CLINICAL DATA:  Syncope.  Fall.  Unresponsive episode.  EXAM: CT HEAD WITHOUT CONTRAST  CT CERVICAL SPINE WITHOUT CONTRAST  TECHNIQUE: Multidetector CT imaging of the head and cervical spine was performed following the standard protocol without intravenous contrast. Multiplanar CT image reconstructions of the cervical spine were also generated.  COMPARISON:  Head MRI 12/11/2003  FINDINGS: CT HEAD FINDINGS  There is moderate cerebral atrophy, stable to minimally increased from prior MRI. Confluent periventricular white matter hypodensities are compatible with moderate chronic small vessel ischemic disease. There is no evidence of acute cortical infarct, mass, midline shift, intracranial hemorrhage, or extra-axial fluid collection. Prior left cataract surgery is noted. A small right maxillary sinus mucous retention cyst is noted. Mastoid air cells are clear.  CT CERVICAL SPINE FINDINGS  There is straightening of the cervical spine. There is grade 1 anterolisthesis of C4 on C5 and of C7 on T1, likely facet mediated. Prevertebral soft tissues are within normal limits. No acute cervical spine fracture is identified.  Advanced degenerative changes are present at C1-2. There is also severe disc space narrowing with associated osteophytosis at C5-6 and C6-7. Multilevel facet arthrosis is present, severe bilaterally at C2-3, on the left at C3-4, and on the right at C4-5. There is likely severe spinal stenosis at C5-6. There is moderate to severe neural foraminal stenosis on the left at C3-4, on the right at C4-5, on the left at C5-6, and on the right at C6-7.  Atherosclerotic calcification is noted involving the innominate and left subclavian arteries and carotid bifurcations. Visualized lung apices demonstrate centrilobular emphysema.  IMPRESSION: 1. No evidence of acute intracranial abnormality. 2. Moderate chronic small vessel ischemic disease. 3. No acute osseous abnormality identified in the cervical spine. 4. Advanced  multilevel degenerative disc disease and facet arthrosis.   Electronically Signed   By: Logan Bores   On: 11/17/2013 18:55   Ct Cervical Spine Wo Contrast  11/17/2013   CLINICAL DATA:  Syncope.  Fall.  Unresponsive episode.  EXAM: CT HEAD WITHOUT CONTRAST  CT CERVICAL SPINE WITHOUT CONTRAST  TECHNIQUE: Multidetector CT imaging of the head and cervical spine was performed following the standard protocol without intravenous contrast. Multiplanar CT image reconstructions of the cervical spine were also generated.  COMPARISON:  Head MRI 12/11/2003  FINDINGS: CT HEAD FINDINGS  There is moderate cerebral atrophy, stable to minimally increased from prior MRI. Confluent periventricular white matter hypodensities are compatible with moderate chronic small vessel ischemic disease. There is no evidence of acute cortical infarct, mass, midline shift, intracranial hemorrhage, or extra-axial fluid collection. Prior left cataract surgery is noted. A small right maxillary sinus mucous retention cyst is noted. Mastoid air cells are clear.  CT CERVICAL SPINE  FINDINGS  There is straightening of the cervical spine. There is grade 1 anterolisthesis of C4 on C5 and of C7 on T1, likely facet mediated. Prevertebral soft tissues are within normal limits. No acute cervical spine fracture is identified.  Advanced degenerative changes are present at C1-2. There is also severe disc space narrowing with associated osteophytosis at C5-6 and C6-7. Multilevel facet arthrosis is present, severe bilaterally at C2-3, on the left at C3-4, and on the right at C4-5. There is likely severe spinal stenosis at C5-6. There is moderate to severe neural foraminal stenosis on the left at C3-4, on the right at C4-5, on the left at C5-6, and on the right at C6-7.  Atherosclerotic calcification is noted involving the innominate and left subclavian arteries and carotid bifurcations. Visualized lung apices demonstrate centrilobular emphysema.  IMPRESSION: 1. No  evidence of acute intracranial abnormality. 2. Moderate chronic small vessel ischemic disease. 3. No acute osseous abnormality identified in the cervical spine. 4. Advanced multilevel degenerative disc disease and facet arthrosis.   Electronically Signed   By: Logan Bores   On: 11/17/2013 18:55   Dg Chest Port 1 View  11/17/2013   CLINICAL DATA:  Syncope.  Choking.  EXAM: PORTABLE CHEST - 1 VIEW  COMPARISON:  Chest x-ray 04/20/2005.  FINDINGS: Mediastinum and hilar structures are stable. Mild fullness is unchanged and consistent with prominent vasculature. Heart size normal. Bibasilar atelectasis and/or infiltrates noted. Poor inspiration. No pleural effusion or pneumothorax. Moderate gastric distention present.  IMPRESSION: 1. Poor inspiration with mild bibasilar atelectasis and/or pneumonia. 2. Moderate gastric distention. Abdominal series suggested for confirmation.   Electronically Signed   By: Marcello Moores  Register   On: 11/17/2013 18:26   Dg Swallowing Func-speech Pathology  11/18/2013   Lorre Nick, [place]     11/18/2013  3:16 PM Objective Swallowing Evaluation: Modified Barium Swallowing Study   Patient Details  Name: HIKARU DELORENZO MRN: [id number] Date of Birth: [birth year]  Today's Date: 11/18/2013 Time: 1310-1345 SLP Time Calculation (min): 35 min  Past Medical History:  Past Medical History  Diagnosis Date  . CARCINOMA, SKIN, SQUAMOUS CELL 05/26/2008  . GERD 02/22/2007  . HYPERLIPIDEMIA 02/22/2007  . HYPERTENSION 06/17/2007  . MACULAR DEGENERATION 02/22/2007  . OSTEOARTHRITIS, HIP, RIGHT 06/05/2009   Past Surgical History:  Past Surgical History  Procedure Laterality Date  . Laminectomy  19825   HPI:  80 year old male admitted 11/17/13 after choking on a hot cross  bun.  PMH significant for GERD.  Pt reports accumulation of  "phlegm" which is what interfered with swallow function/safety  yesterday.  No history of dysphagia prior to admit.     Assessment / Plan / Recommendation Clinical Impression  Dysphagia  Diagnosis: Within Functional Limits Clinical impression: Pt presents with mild pharyngeal dysphagia,  largely due to what appears to be osteophytes at C5/C6.  (Radiologist was not present to confirm this). This caused a  decrease of epilgottic deflection which consequently resulted in  vallecular residue and incomplete airway closure.  Chin tuck and  dry swallow were ineffective in clearing residue, but alternating  solid and liquid and dry swallows were effective. Subepiglottic  penetration occurred on thin liquids, but was cleared with throat  clear and was not aspirated. Barium tablet was tolerated well,  given with water.  Recommend regular diet, but to have solids  taken in small bites, one at a time. Thin liquids via cup or  straw are safe as well. Medications whole with liquid,  one at a  time.    Treatment Recommendation  No treatment recommended at this time    Diet Recommendation Regular;Thin liquid   Liquid Administration via: Cup;Straw Medication Administration: Whole meds with liquid Supervision: Patient able to self feed Compensations: Slow rate;Small sips/bites;Follow solids with  liquid Postural Changes and/or Swallow Maneuvers: Seated upright 90  degrees    Other  Recommendations Recommended Consults: MBS Oral Care Recommendations: Oral care BID   Follow Up Recommendations  None    Frequency and Duration   n/a     Pertinent Vitals/Pain VSS, no pain reported    SLP Swallow Goals     General Date of Onset: 11/17/13 HPI: 80 year old male admitted 11/17/13 after choking on a hot  cross bun.  PMH significant for GERD.  Pt reports accumulation of  "phlegm" which is what interfered with swallow function/safety  yesterday.  No history of dysphagia prior to admit. Type of Study: Modified Barium Swallowing Study Reason for Referral: Objectively evaluate swallowing function Previous Swallow Assessment: BSE this morning Diet Prior to this Study: NPO Temperature Spikes Noted: Yes Respiratory Status: Room air  History of Recent Intubation: No Behavior/Cognition: Alert;Cooperative;Hard of hearing;Pleasant  mood Oral Cavity - Dentition: Adequate natural dentition Oral Motor / Sensory Function: Within functional limits Self-Feeding Abilities: Able to feed self Patient Positioning: Upright in chair Baseline Vocal Quality: Clear Volitional Cough: Strong Volitional Swallow: Able to elicit Anatomy: Within functional limits Pharyngeal Secretions: Not observed secondary MBS    Reason for Referral Objectively evaluate swallowing function   Oral Phase Oral Preparation/Oral Phase Oral Phase: WFL   Pharyngeal Phase Pharyngeal Phase Pharyngeal Phase: Impaired Pharyngeal - Thin Pharyngeal - Thin Cup: Delayed swallow initiation;Premature  spillage to valleculae;Reduced epiglottic inversion;Pharyngeal  residue - valleculae;Pharyngeal residue - pyriform sinuses Pharyngeal - Thin Straw: Delayed swallow initiation;Premature  spillage to valleculae;Reduced epiglottic inversion Pharyngeal - Solids Pharyngeal - Puree: Delayed swallow initiation;Premature spillage  to valleculae;Reduced epiglottic inversion;Pharyngeal residue -  valleculae;Pharyngeal residue - pyriform sinuses;Compensatory  strategies attempted (Comment) (chin tuck ineffective in  eliminating vallecular and pyriform residue.) Pharyngeal - Multi-consistency: Delayed swallow  initiation;Premature spillage to valleculae;Reduced epiglottic  inversion;Pharyngeal residue - valleculae;Pharyngeal residue -  pyriform sinuses Pharyngeal - Pill: Delayed swallow initiation;Premature spillage  to valleculae;Reduced epiglottic inversion  Cervical Esophageal Phase    GO    Cervical Esophageal Phase Cervical Esophageal Phase: [facility]    Functional Assessment Tool Used: ASHA NOMS Functional Limitations: Swallowing Swallow Current Status (Z0258): At least 1 percent but less than  20 percent impaired, limited or restricted Swallow Goal Status [phone]): At least 1 percent but less than 20  percent  impaired, limited or restricted Swallow Discharge Status [phone]): At least 1 percent but less  than 20 percent impaired, limited or restricted   Celia B. Quentin Ore [facility], CCC-SLP [phone]  Shonna Chock 11/18/2013, 3:15 PM     Microbiology: No results found for this or any previous visit (from the past 240 hour(s)).   Labs: Basic Metabolic Panel:  Recent Labs Lab 11/17/13 1811  NA 139  K 3.7  CL 100  CO2 20  GLUCOSE 152*  BUN 26*  CREATININE 1.22  CALCIUM 9.3   Liver Function Tests:  Recent Labs Lab 11/17/13 1811  AST 23  ALT 13  ALKPHOS 75  BILITOT 0.5  PROT 7.2  ALBUMIN 3.7   No results found for this basename: LIPASE, AMYLASE,  in the last 168 hours No results found for this basename: AMMONIA,  in  the last 168 hours CBC:  Recent Labs Lab 11/17/13 1811  WBC 9.1  NEUTROABS 6.2  HGB 12.6*  HCT 37.0*  MCV 93.2  PLT 156   Cardiac Enzymes:  Recent Labs Lab 11/17/13 1811  TROPONINI <0.30   BNP: BNP (last 3 results) No results found for this basename: PROBNP,  in the last 8760 hours CBG: No results found for this basename: GLUCAP,  in the last 168 hours     Signed:  Kinnie Feil  [facility] 11/18/2013, 3:51 PM

## 2013-12-07 ENCOUNTER — Encounter: Payer: Self-pay | Admitting: Family Medicine

## 2013-12-07 ENCOUNTER — Ambulatory Visit (INDEPENDENT_AMBULATORY_CARE_PROVIDER_SITE_OTHER): Payer: Medicare Other | Admitting: Family Medicine

## 2013-12-07 VITALS — BP 130/80 | HR 81 | Temp 97.5°F | Wt 170.0 lb

## 2013-12-07 DIAGNOSIS — R059 Cough, unspecified: Secondary | ICD-10-CM

## 2013-12-07 DIAGNOSIS — R350 Frequency of micturition: Secondary | ICD-10-CM

## 2013-12-07 DIAGNOSIS — R05 Cough: Secondary | ICD-10-CM

## 2013-12-07 DIAGNOSIS — R053 Chronic cough: Secondary | ICD-10-CM

## 2013-12-07 DIAGNOSIS — K219 Gastro-esophageal reflux disease without esophagitis: Secondary | ICD-10-CM

## 2013-12-07 LAB — POCT URINALYSIS DIPSTICK
Bilirubin, UA: NEGATIVE
Blood, UA: NEGATIVE
GLUCOSE UA: NEGATIVE
LEUKOCYTES UA: NEGATIVE
NITRITE UA: NEGATIVE
PH UA: 5.5
Spec Grav, UA: 1.025
UROBILINOGEN UA: 0.2

## 2013-12-07 MED ORDER — OMEPRAZOLE 20 MG PO CPDR: 20.0000 mg | DELAYED_RELEASE_CAPSULE | Freq: Every day | ORAL | Status: DC

## 2013-12-07 NOTE — Progress Notes (Signed)
Pre visit review using our clinic review tool, if applicable. No additional management support is needed unless otherwise documented below in the visit note. 

## 2013-12-07 NOTE — Progress Notes (Signed)
Subjective:    Patient ID: Roger [name], male    DOB: 03-12-1920, 80 y.o.   MRN: [id number]  HPI Comments: Patient is a 80 year-old male who presents with complaints of cough and difficulty urinary. He said he felt weak after his recent hospitalization, is now feeling much better but a little more fatigued than usual. History is provided by patient and wife.   Reports a  "thick glue-like" phlegm for about 6 months. Has tried zyrtec, clairitin-D and nasocort with minimal relief. He is now using Flonase which is helping more. Was hospitalized 2 weeks ago after an episode of choking on a hot cross bun. Patient believes choking episode was caused by difficulty swallowing due to excess phlegm. Patient is still experiencing the thick phlegm but has not choked since his recent hospitalization. He begins coughing for up to 10 minutes at a time when his throat is full of phlegm. The coughing episodes happen every other night. It is white in color with slight tinge of yellow. No hemoptysis or heartburn. Pollen seems to aggravate his symptoms. Smoking history includes a cigar once a month and a pipe daily for 50 years. Patient has not been exposed to any sick contacts. Patient reports feeling more hoarse than normal for about 6 months.    Difficulty urinating began the week after discharge from hospital. Symptoms began to improve this past weekend, but last night was experiencing frequency and urgency, then hesitancy. Denies dysuria but reports occasional incontinence. Hasn't tried anything for improvement. Most of the time his urine is clear and has not seen any hematuria. Patient a cup of coffee and orange juice in the morning, a half glass of wine with dinner, and drinks 1 beer a day. Patient does not drink any water though he was recommended to do so upon hospital discharge. Patient tells wife he will start drinking more water.  Patient reports previously normal PSA at 1.0  Patient takes ditropan.     Cough The current episode started more than 1 month ago. The problem has been unchanged. The cough is productive of sputum. Associated symptoms include postnasal drip and rhinorrhea. Pertinent negatives include no chest pain, chills, fever, headaches, heartburn, hemoptysis, myalgias, nasal congestion, shortness of breath or weight loss. The symptoms are aggravated by pollens. Risk factors for lung disease include smoking/tobacco exposure.     Past Medical History  Diagnosis Date  . CARCINOMA, SKIN, SQUAMOUS CELL 05/26/2008  . GERD 02/22/2007  . HYPERLIPIDEMIA 02/22/2007  . HYPERTENSION 06/17/2007  . MACULAR DEGENERATION 02/22/2007  . OSTEOARTHRITIS, HIP, RIGHT 06/05/2009    Past Surgical History  Procedure Laterality Date  . Laminectomy  1988    Review of Systems  Constitutional: Positive for fatigue. Negative for fever, chills and weight loss.  HENT: Positive for postnasal drip and rhinorrhea. Negative for congestion and hearing loss.   Respiratory: Positive for cough. Negative for hemoptysis, chest tightness and shortness of breath.   Cardiovascular: Negative for chest pain.  Gastrointestinal: Negative for heartburn, nausea, vomiting, abdominal pain, diarrhea, constipation and blood in stool.  Genitourinary: Positive for frequency and difficulty urinating. Negative for dysuria, hematuria and flank pain.  Musculoskeletal: Negative for back pain and myalgias.  Neurological: Negative for dizziness, syncope, weakness, light-headedness and headaches.  Psychiatric/Behavioral: Negative for hallucinations and confusion.       Objective:   Physical Exam  Constitutional: He appears well-developed and well-nourished.  HENT:  Nose: Nose normal. No rhinorrhea.  Mouth/Throat: Uvula is midline and oropharynx is  clear and moist.  Patient sounds somewhat hoarse.   Eyes: Conjunctivae and EOM are normal. Pupils are equal, round, and reactive to light.  Cardiovascular: Normal rate, normal  heart sounds, intact distal pulses and normal pulses.   Pulmonary/Chest: Effort normal and breath sounds normal.  Abdominal: Soft. There is no tenderness.  Genitourinary: Prostate normal.  Neurological: He is alert.  Skin: Skin is warm and dry.  Psychiatric: He has a normal mood and affect. His behavior is normal. Thought content normal. He is not agitated, not actively hallucinating and not combative.          Assessment & Plan:  #1 GERD Try ompeprazole 20 mg daily and see back in 1 month to reassess.  #2 Urinary Urgency Urinalysis to assess for dehydration and infection. Obstruction less likely so postpone consideration of post void residual test. Continue low dose ditropan without increase due to side effects of constipation and dry mouth.   [place], PA-S   As above.  He had recent syncope after choking on bread.  Persistent, several month hx of cough and frequently clearing of throat.  No fever to suggest aspiration pneumonia and CXR no acute findings.  ?silent reflux.  Trial of Omeprazole 20 mg once daily and reassess one month. Prostate is mildly enlarged and nontender with no mass.  Suspect mostly urine urgency with possible mild obstructive component but no acute symptoms.  Carolann Littler, MD

## 2013-12-07 NOTE — Patient Instructions (Addendum)
Gastroesophageal Reflux Disease, Adult  Gastroesophageal reflux disease (GERD) happens when acid from your stomach flows up into the esophagus. When acid comes in contact with the esophagus, the acid causes soreness (inflammation) in the esophagus. Over time, GERD may create small holes (ulcers) in the lining of the esophagus.  CAUSES   · Increased body weight. This puts pressure on the stomach, making acid rise from the stomach into the esophagus.  · Smoking. This increases acid production in the stomach.  · Drinking alcohol. This causes decreased pressure in the lower esophageal sphincter (valve or ring of muscle between the esophagus and stomach), allowing acid from the stomach into the esophagus.  · Late evening meals and a full stomach. This increases pressure and acid production in the stomach.  · A malformed lower esophageal sphincter.  Sometimes, no cause is found.  SYMPTOMS   · Burning pain in the lower part of the mid-chest behind the breastbone and in the mid-stomach area. This may occur twice a week or more often.  · Trouble swallowing.  · Sore throat.  · Dry cough.  · Asthma-like symptoms including chest tightness, shortness of breath, or wheezing.  DIAGNOSIS   Your caregiver may be able to diagnose GERD based on your symptoms. In some cases, X-rays and other tests may be done to check for complications or to check the condition of your stomach and esophagus.  TREATMENT   Your caregiver may recommend over-the-counter or prescription medicines to help decrease acid production. Ask your caregiver before starting or adding any new medicines.   HOME CARE INSTRUCTIONS   · Change the factors that you can control. Ask your caregiver for guidance concerning weight loss, quitting smoking, and alcohol consumption.  · Avoid foods and drinks that make your symptoms worse, such as:  · Caffeine or alcoholic drinks.  · Chocolate.  · Peppermint or mint flavorings.  · Garlic and onions.  · Spicy foods.  · Citrus fruits,  such as oranges, lemons, or limes.  · Tomato-based foods such as sauce, chili, salsa, and pizza.  · Fried and fatty foods.  · Avoid lying down for the 3 hours prior to your bedtime or prior to taking a nap.  · Eat small, frequent meals instead of large meals.  · Wear loose-fitting clothing. Do not wear anything tight around your waist that causes pressure on your stomach.  · Raise the head of your bed 6 to 8 inches with wood blocks to help you sleep. Extra pillows will not help.  · Only take over-the-counter or prescription medicines for pain, discomfort, or fever as directed by your caregiver.  · Do not take aspirin, ibuprofen, or other nonsteroidal anti-inflammatory drugs (NSAIDs).  SEEK IMMEDIATE MEDICAL CARE IF:   · You have pain in your arms, neck, jaw, teeth, or back.  · Your pain increases or changes in intensity or duration.  · You develop nausea, vomiting, or sweating (diaphoresis).  · You develop shortness of breath, or you faint.  · Your vomit is green, yellow, black, or looks like coffee grounds or blood.  · Your stool is red, bloody, or black.  These symptoms could be signs of other problems, such as heart disease, gastric bleeding, or esophageal bleeding.  MAKE SURE YOU:   · Understand these instructions.  · Will watch your condition.  · Will get help right away if you are not doing well or get worse.  Document Released: 05/21/2005 Document Revised: 11/03/2011 Document Reviewed: 02/28/2011  [employer]® Patient   Information ©2014 [employer].

## 2013-12-08 ENCOUNTER — Other Ambulatory Visit: Payer: Self-pay | Admitting: Internal Medicine

## 2013-12-13 ENCOUNTER — Ambulatory Visit (INDEPENDENT_AMBULATORY_CARE_PROVIDER_SITE_OTHER): Payer: Medicare Other | Admitting: Internal Medicine

## 2013-12-13 ENCOUNTER — Encounter: Payer: Self-pay | Admitting: Internal Medicine

## 2013-12-13 VITALS — BP 142/86 | HR 72 | Temp 97.9°F | Ht 68.5 in | Wt 171.0 lb

## 2013-12-13 DIAGNOSIS — Z2911 Encounter for prophylactic immunotherapy for respiratory syncytial virus (RSV): Secondary | ICD-10-CM

## 2013-12-13 DIAGNOSIS — M169 Osteoarthritis of hip, unspecified: Secondary | ICD-10-CM

## 2013-12-13 DIAGNOSIS — R269 Unspecified abnormalities of gait and mobility: Secondary | ICD-10-CM

## 2013-12-13 DIAGNOSIS — M1610 Unilateral primary osteoarthritis, unspecified hip: Secondary | ICD-10-CM

## 2013-12-13 NOTE — Progress Notes (Signed)
Pre visit review using our clinic review tool, if applicable. No additional management support is needed unless otherwise documented below in the visit note. 

## 2013-12-16 NOTE — Assessment & Plan Note (Signed)
He has significant OA Not a surgical candidate  Refer PT for gait instability

## 2013-12-16 NOTE — Progress Notes (Signed)
Recent fall Has home walker and uses it Still falls  Needs zostavax  htn- tolerating meds- no orthostais  Reviewed pmh, meds  Reviewed vitals Elderly dhest cta cv reg rate abd soft Ext trace edema  Recent hospitalization for syncope No further eval necessary This was vasobagal

## 2013-12-20 ENCOUNTER — Telehealth: Payer: Self-pay | Admitting: Internal Medicine

## 2013-12-20 NOTE — Telephone Encounter (Signed)
Ganie call from [facility] would like to let you know he has seen this patient and planning to see 2 week 2 then 1week 2 to cover ADL and then transfer training  Teaching compensatory strag energy conservation  and safety lastly home exercise program

## 2013-12-22 NOTE — Telephone Encounter (Signed)
Ok per Dr Swords, verbal order given 

## 2014-01-04 ENCOUNTER — Ambulatory Visit: Payer: Medicare Other | Admitting: Family Medicine

## 2014-01-05 ENCOUNTER — Ambulatory Visit (INDEPENDENT_AMBULATORY_CARE_PROVIDER_SITE_OTHER): Payer: Medicare Other | Admitting: Family Medicine

## 2014-01-05 ENCOUNTER — Encounter: Payer: Self-pay | Admitting: Family Medicine

## 2014-01-05 VITALS — BP 130/76 | HR 86 | Wt 173.0 lb

## 2014-01-05 DIAGNOSIS — L570 Actinic keratosis: Secondary | ICD-10-CM

## 2014-01-05 DIAGNOSIS — K219 Gastro-esophageal reflux disease without esophagitis: Secondary | ICD-10-CM

## 2014-01-05 NOTE — Progress Notes (Signed)
   Subjective:    Patient ID: Roger [name], male    DOB: 1920-04-22, 80 y.o.   MRN: [id number]  HPI Patient seen for followup regarding recent persistent cough and frequent clearing of throat. We suspected possible silent GERD. We started omeprazole 20 mg daily and both patient and wife states symptoms are dramatically improved. He denies any cough at this point. No dysphagia.  Patient recently had some difficulties with ambulation and poor posture. He started physical therapy occupational therapy a week ago and they were already seeing some improvements in terms of his posture and ambulation  Patient has scaly lesion left side of face which is irritated because of shaving. No bleeding or itching.  Past Medical History  Diagnosis Date  . CARCINOMA, SKIN, SQUAMOUS CELL 05/26/2008  . GERD 02/22/2007  . HYPERLIPIDEMIA 02/22/2007  . HYPERTENSION 06/17/2007  . MACULAR DEGENERATION 02/22/2007  . OSTEOARTHRITIS, HIP, RIGHT 06/05/2009   Past Surgical History  Procedure Laterality Date  . Laminectomy  1988    reports that he has quit smoking. He does not have any smokeless tobacco history on file. His alcohol and drug histories are not on file. family history includes Breast cancer in his daughter. Allergies  Allergen Reactions  . Amlodipine Besylate     REACTION: swelling of feet  . Aspirin     REACTION: GI upset can tolerate ecotrin  . Hydrocodone     GI upset      Review of Systems  Constitutional: Negative for fever and chills.  Respiratory: Negative for cough and shortness of breath.   Cardiovascular: Negative for chest pain.       Objective:   Physical Exam  Constitutional: He appears well-developed and well-nourished.  Cardiovascular: Normal rate.   Pulmonary/Chest: Effort normal and breath sounds normal. No respiratory distress. He has no wheezes. He has no rales.  Skin:  Patient has hyperkeratotic lesion left side of face around the lateral zygomatic arch region. No  bleeding. No ulceration. About 5-6 mm diameter          Assessment & Plan:  #1 recent chronic cough. Suspect related to GERD. Symptomatically improved after starting omeprazole.  Continue with Omeprazole. #2 actinic keratosis versus seborrheic keratosis left face. Recommend treatment with liquid nitrogen after discussing risks and benefits patient consented. Without difficulty. Reassess 3 months

## 2014-01-05 NOTE — Progress Notes (Signed)
Pre visit review using our clinic review tool, if applicable. No additional management support is needed unless otherwise documented below in the visit note. 

## 2014-02-15 ENCOUNTER — Other Ambulatory Visit: Payer: Self-pay | Admitting: Internal Medicine

## 2014-02-22 ENCOUNTER — Other Ambulatory Visit: Payer: Self-pay | Admitting: Internal Medicine

## 2014-03-08 ENCOUNTER — Ambulatory Visit (INDEPENDENT_AMBULATORY_CARE_PROVIDER_SITE_OTHER): Payer: Medicare Other | Admitting: Family Medicine

## 2014-03-08 ENCOUNTER — Encounter: Payer: Self-pay | Admitting: Family Medicine

## 2014-03-08 VITALS — BP 130/72 | HR 88 | Wt 173.0 lb

## 2014-03-08 DIAGNOSIS — R600 Localized edema: Secondary | ICD-10-CM

## 2014-03-08 DIAGNOSIS — R609 Edema, unspecified: Secondary | ICD-10-CM

## 2014-03-08 NOTE — Progress Notes (Signed)
   Subjective:    Patient ID: Roger [name], male    DOB: 12-10-1919, 80 y.o.   MRN: [id number]  HPI   Bilateral leg edema. History of some chronic leg edema which has worsened over the past several days -possibly 10 or so. No dyspnea. He takes furosemide 20 mg once daily. Watches his salt intake closely. Usually wears compression garments but not today. He has frequent nocturia at baseline. Medications reviewed.  No chest pains. No orthopnea. He weighs himself but only about once per week and weight has been stable.  Past Medical History  Diagnosis Date  . CARCINOMA, SKIN, SQUAMOUS CELL 05/26/2008  . GERD 02/22/2007  . HYPERLIPIDEMIA 02/22/2007  . HYPERTENSION 06/17/2007  . MACULAR DEGENERATION 02/22/2007  . OSTEOARTHRITIS, HIP, RIGHT 06/05/2009   Past Surgical History  Procedure Laterality Date  . Laminectomy  1988    reports that he has quit smoking. He does not have any smokeless tobacco history on file. His alcohol and drug histories are not on file. family history includes Breast cancer in his daughter. Allergies  Allergen Reactions  . Amlodipine Besylate     REACTION: swelling of feet  . Aspirin     REACTION: GI upset can tolerate ecotrin  . Hydrocodone     GI upset      Review of Systems  Constitutional: Negative for appetite change and unexpected weight change.  Respiratory: Negative for cough and shortness of breath.   Cardiovascular: Positive for leg swelling. Negative for chest pain and palpitations.  Gastrointestinal: Negative for abdominal pain.  Genitourinary: Negative for dysuria.  Neurological: Negative for dizziness.       Objective:   Physical Exam  Constitutional: He appears well-developed and well-nourished.  Neck: Neck supple. No JVD present.  Cardiovascular: Normal rate.  Exam reveals no gallop.   Pulmonary/Chest: Effort normal and breath sounds normal. No respiratory distress. He has no wheezes. He has no rales.  Musculoskeletal: He exhibits  edema.  1+ pitting edema legs bilaterally. Right leg slightly greater than left.  No calf tenderness.          Assessment & Plan:  Bilateral leg edema with exacerbation. Elevate legs frequently. Increase furosemide to 40 mg daily for 3 days then drop back to 20 mg. Potassium rich diet. Watch salt intake closely.

## 2014-03-08 NOTE — Patient Instructions (Signed)
Increase furosemide 20 mg to TWO DAILY for 3 days, then decrease back to one daily Elevate legs frequently. Watch salt intake.

## 2014-03-08 NOTE — Progress Notes (Signed)
Pre visit review using our clinic review tool, if applicable. No additional management support is needed unless otherwise documented below in the visit note. 

## 2014-04-15 ENCOUNTER — Other Ambulatory Visit: Payer: Self-pay | Admitting: Internal Medicine

## 2014-05-05 ENCOUNTER — Other Ambulatory Visit: Payer: Self-pay | Admitting: Internal Medicine

## 2014-05-30 ENCOUNTER — Emergency Department (HOSPITAL_BASED_OUTPATIENT_CLINIC_OR_DEPARTMENT_OTHER): Payer: Medicare Other

## 2014-05-30 ENCOUNTER — Emergency Department (HOSPITAL_BASED_OUTPATIENT_CLINIC_OR_DEPARTMENT_OTHER)
Admission: EM | Admit: 2014-05-30 | Discharge: 2014-05-30 | Disposition: A | Discharge status: Home / Self Care | Payer: Medicare Other | Attending: Emergency Medicine | Admitting: Emergency Medicine

## 2014-05-30 ENCOUNTER — Encounter (HOSPITAL_BASED_OUTPATIENT_CLINIC_OR_DEPARTMENT_OTHER): Payer: Self-pay | Admitting: Emergency Medicine

## 2014-05-30 ENCOUNTER — Telehealth: Payer: Self-pay | Admitting: Internal Medicine

## 2014-05-30 DIAGNOSIS — Z79899 Other long term (current) drug therapy: Secondary | ICD-10-CM | POA: Insufficient documentation

## 2014-05-30 DIAGNOSIS — S0990XA Unspecified injury of head, initial encounter: Secondary | ICD-10-CM | POA: Diagnosis present

## 2014-05-30 DIAGNOSIS — M1990 Unspecified osteoarthritis, unspecified site: Secondary | ICD-10-CM | POA: Insufficient documentation

## 2014-05-30 DIAGNOSIS — I10 Essential (primary) hypertension: Secondary | ICD-10-CM | POA: Insufficient documentation

## 2014-05-30 DIAGNOSIS — E785 Hyperlipidemia, unspecified: Secondary | ICD-10-CM | POA: Insufficient documentation

## 2014-05-30 DIAGNOSIS — S0101XA Laceration without foreign body of scalp, initial encounter: Secondary | ICD-10-CM

## 2014-05-30 DIAGNOSIS — K219 Gastro-esophageal reflux disease without esophagitis: Secondary | ICD-10-CM | POA: Diagnosis not present

## 2014-05-30 DIAGNOSIS — Y92009 Unspecified place in unspecified non-institutional (private) residence as the place of occurrence of the external cause: Secondary | ICD-10-CM

## 2014-05-30 DIAGNOSIS — Z85828 Personal history of other malignant neoplasm of skin: Secondary | ICD-10-CM | POA: Insufficient documentation

## 2014-05-30 DIAGNOSIS — Z87891 Personal history of nicotine dependence: Secondary | ICD-10-CM | POA: Diagnosis not present

## 2014-05-30 DIAGNOSIS — Z7982 Long term (current) use of aspirin: Secondary | ICD-10-CM | POA: Insufficient documentation

## 2014-05-30 DIAGNOSIS — W19XXXA Unspecified fall, initial encounter: Secondary | ICD-10-CM

## 2014-05-30 MED ORDER — LIDOCAINE-EPINEPHRINE-TETRACAINE (LET) SOLUTION
3.0000 mL | Freq: Once | NASAL | Status: AC
  Administered 2014-05-30: 3 mL via TOPICAL
  Filled 2014-05-30: qty 3

## 2014-05-30 MED ORDER — LIDOCAINE-EPINEPHRINE (PF) 2 %-1:200000 IJ SOLN
10.0000 mL | Freq: Once | INTRAMUSCULAR | Status: DC
  Filled 2014-05-30: qty 20

## 2014-05-30 NOTE — Discharge Instructions (Signed)
Fall Prevention and Home Safety Falls cause injuries and can affect all age groups. It is possible to use preventive measures to significantly decrease the likelihood of falls. There are many simple measures which can make your home safer and prevent falls. OUTDOORS  Repair cracks and edges of walkways and driveways.  Remove high doorway thresholds.  Trim shrubbery on the main path into your home.  Have good outside lighting.  Clear walkways of tools, rocks, debris, and clutter.  Check that handrails are not broken and are securely fastened. Both sides of steps should have handrails.  Have leaves, snow, and ice cleared regularly.  Use sand or salt on walkways during winter months.  In the garage, clean up grease or oil spills. BATHROOM  Install night lights.  Install grab bars by the toilet and in the tub and shower.  Use non-skid mats or decals in the tub or shower.  Place a plastic non-slip stool in the shower to sit on, if needed.  Keep floors dry and clean up all water on the floor immediately.  Remove soap buildup in the tub or shower on a regular basis.  Secure bath mats with non-slip, double-sided rug tape.  Remove throw rugs and tripping hazards from the floors. BEDROOMS  Install night lights.  Make sure a bedside light is easy to reach.  Do not use oversized bedding.  Keep a telephone by your bedside.  Have a firm chair with side arms to use for getting dressed.  Remove throw rugs and tripping hazards from the floor. KITCHEN  Keep handles on pots and pans turned toward the center of the stove. Use back burners when possible.  Clean up spills quickly and allow time for drying.  Avoid walking on wet floors.  Avoid hot utensils and knives.  Position shelves so they are not too high or low.  Place commonly used objects within easy reach.  If necessary, use a sturdy step stool with a grab bar when reaching.  Keep electrical cables out of the  way.  Do not use floor polish or wax that makes floors slippery. If you must use wax, use non-skid floor wax.  Remove throw rugs and tripping hazards from the floor. STAIRWAYS  Never leave objects on stairs.  Place handrails on both sides of stairways and use them. Fix any loose handrails. Make sure handrails on both sides of the stairways are as long as the stairs.  Check carpeting to make sure it is firmly attached along stairs. Make repairs to worn or loose carpet promptly.  Avoid placing throw rugs at the top or bottom of stairways, or properly secure the rug with carpet tape to prevent slippage. Get rid of throw rugs, if possible.  Have an electrician put in a light switch at the top and bottom of the stairs. OTHER FALL PREVENTION TIPS  Wear low-heel or rubber-soled shoes that are supportive and fit well. Wear closed toe shoes.  When using a stepladder, make sure it is fully opened and both spreaders are firmly locked. Do not climb a closed stepladder.  Add color or contrast paint or tape to grab bars and handrails in your home. Place contrasting color strips on first and last steps.  Learn and use mobility aids as needed. Install an electrical emergency response system.  Turn on lights to avoid dark areas. Replace light bulbs that burn out immediately. Get light switches that glow.  Arrange furniture to create clear pathways. Keep furniture in the same place.  Firmly attach carpet with non-skid or double-sided tape.  Eliminate uneven floor surfaces.  Select a carpet pattern that does not visually hide the edge of steps.  Be aware of all pets. OTHER HOME SAFETY TIPS  Set the water temperature for 120 F (48.8 C).  Keep emergency numbers on or near the telephone.  Keep smoke detectors on every level of the home and near sleeping areas. Document Released: 08/01/2002 Document Revised: 02/10/2012 Document Reviewed: 10/31/2011 [facility] Patient Information 2015  [place], [place]. This information is not intended to replace advice given to you by your health care provider. Make sure you discuss any questions you have with your health care provider. Staple Care and Removal Your caregiver has used staples today to repair your wound. Staples are used to help a wound heal faster by holding the edges of the wound together. The staples can be removed when the wound has healed well enough to stay together after the staples are removed. A dressing (wound covering), depending on the location of the wound, may have been applied. This may be changed once per day or as instructed. If the dressing sticks, it may be soaked off with soapy water or hydrogen peroxide. Only take over-the-counter or prescription medicines for pain, discomfort, or fever as directed by your caregiver.  If you did not receive a tetanus shot today because you did not recall when your last one was given, check with your caregiver when you have your staples removed to determine if one is needed. Return to your caregiver's office in 1 week or as suggested to have your staples removed. SEEK IMMEDIATE MEDICAL CARE IF:   You have redness, swelling, or increasing pain in the wound.  You have pus coming from the wound.  You have a fever.  You notice a bad smell coming from the wound or dressing.  Your wound edges break open after staples have been removed. Document Released: 05/06/2001 Document Revised: 11/03/2011 Document Reviewed: 05/21/2005 [facility] Patient Information 2015 [place], [place]. This information is not intended to replace advice given to you by your health care provider. Make sure you discuss any questions you have with your health care provider.

## 2014-05-30 NOTE — ED Notes (Signed)
Pt c/o fall with head laceration x 1 day ago

## 2014-05-30 NOTE — ED Provider Notes (Addendum)
CSN: [id number]     Arrival date & time 05/30/14  1617 History   First MD Initiated Contact with Patient 05/30/14 1717     Chief Complaint  Patient presents with  . Head Injury     (Consider location/radiation/quality/duration/timing/severity/associated sxs/prior Treatment) Patient is a 80 y.o. male presenting with head injury. The history is provided by the patient, the spouse and a relative. No language interpreter was used.  Head Injury Location:  Occipital Time since incident:  19 hours Mechanism of injury: fall   Pain details:    Quality:  Aching   Severity:  Mild   Duration:  19 hours   Timing:  Constant   Progression:  Improving Chronicity:  New Relieved by:  Pressure Worsened by:  Nothing tried Ineffective treatments:  None tried Associated symptoms: no blurred vision, no difficulty breathing, no disorientation, no double vision, no focal weakness, no headaches, no hearing loss, no loss of consciousness, no memory loss, no nausea, no neck pain, no numbness, no seizures and no vomiting   Risk factors: aspirin and being elderly     Past Medical History  Diagnosis Date  . CARCINOMA, SKIN, SQUAMOUS CELL 05/26/2008  . GERD 02/22/2007  . HYPERLIPIDEMIA 02/22/2007  . HYPERTENSION 06/17/2007  . MACULAR DEGENERATION 02/22/2007  . OSTEOARTHRITIS, HIP, RIGHT 06/05/2009   Past Surgical History  Procedure Laterality Date  . Laminectomy  1988   Family History  Problem Relation Age of Onset  . Breast cancer Daughter    History  Substance Use Topics  . Smoking status: Former Research scientist (life sciences)  . Smokeless tobacco: Not on file  . Alcohol Use: No    Review of Systems  Constitutional: Negative for fever, activity change, appetite change and fatigue.  HENT: Negative for congestion, facial swelling, hearing loss, rhinorrhea and trouble swallowing.   Eyes: Negative for blurred vision, double vision, photophobia and pain.  Respiratory: Negative for cough, chest tightness and shortness of  breath.   Cardiovascular: Negative for chest pain and leg swelling.  Gastrointestinal: Negative for nausea, vomiting, abdominal pain, diarrhea and constipation.  Endocrine: Negative for polydipsia and polyuria.  Genitourinary: Negative for dysuria, urgency, decreased urine volume and difficulty urinating.  Musculoskeletal: Negative for back pain, gait problem and neck pain.  Skin: Positive for wound. Negative for color change and rash.  Allergic/Immunologic: Negative for immunocompromised state.  Neurological: Negative for dizziness, focal weakness, seizures, loss of consciousness, facial asymmetry, speech difficulty, weakness, numbness and headaches.  Psychiatric/Behavioral: Negative for memory loss, confusion, decreased concentration and agitation.      Allergies  Amlodipine besylate; Aspirin; and Hydrocodone  Home Medications   Prior to Admission medications   Medication Sig Start Date End Date Taking? Authorizing Provider  aspirin (ECOTRIN) 325 MG EC tablet Take 325 mg by mouth daily.    Historical Provider, MD  cetirizine (ZYRTEC) 10 MG tablet TAKE 1 TABLET BY MOUTH EVERY DAY 02/15/14   Lisabeth Pick, MD  furosemide (LASIX) 20 MG tablet TAKE 1 TABLET EVERY DAY 02/22/14   Lisabeth Pick, MD  losartan (COZAAR) 100 MG tablet Take 100 mg by mouth daily.    Historical Provider, MD  losartan (COZAAR) 100 MG tablet TAKE 1 TABLET BY MOUTH EVERY DAY 05/05/14   [name], MD  lovastatin (MEVACOR) 40 MG tablet TAKE 1 TABLET BY MOUTH EVERY DAY 04/17/14   Lisabeth Pick, MD  omeprazole (PRILOSEC) 20 MG capsule Take 1 capsule (20 mg total) by mouth daily. 12/07/13   Eulas Post,  MD  oxybutynin (DITROPAN) 5 MG tablet Take 2.5 mg by mouth daily.    Historical Provider, MD  traMADol (ULTRAM) 50 MG tablet TAKE 1 TO 2 TABLETS BY MOUTH EVERY 6 HOURS AS NEEDED FOR PAIN    Darrick Penna Swords, MD   BP 131/65  Pulse 81  Temp(Src) 98.2 F (36.8 C) (Oral)  Resp 18  Ht 5\' 9"  (1.753 m)  Wt 167 lb  (75.751 kg)  BMI 24.65 kg/m2  SpO2 94% Physical Exam  Constitutional: He is oriented to person, place, and time. He appears well-developed and well-nourished. No distress.  HENT:  Head: Normocephalic. Head is with contusion and with laceration.    Mouth/Throat: No oropharyngeal exudate.  Eyes: Pupils are equal, round, and reactive to light.  Neck: Normal range of motion. Neck supple.  Cardiovascular: Normal rate, regular rhythm and normal heart sounds.  Exam reveals no gallop and no friction rub.   No murmur heard. Pulmonary/Chest: Effort normal and breath sounds normal. No respiratory distress. He has no wheezes. He has no rales.  Abdominal: Soft. Bowel sounds are normal. He exhibits no distension and no mass. There is no tenderness. There is no rebound and no guarding.  Musculoskeletal: Normal range of motion. He exhibits no edema and no tenderness.  Neurological: He is alert and oriented to person, place, and time.  Skin: Skin is warm and dry.  Psychiatric: He has a normal mood and affect.    ED Course  LACERATION REPAIR Date/Time: 05/30/2014 7:00 PM Performed by: Ernestina Patches Authorized by: Ernestina Patches Consent: Verbal consent obtained. written consent not obtained. Risks and benefits: risks, benefits and alternatives were discussed Consent given by: patient Patient understanding: patient states understanding of the procedure being performed Patient consent: the patient's understanding of the procedure matches consent given Procedure consent: procedure consent matches procedure scheduled Relevant documents: relevant documents present and verified Test results: test results available and properly labeled Site marked: the operative site was marked Imaging studies: imaging studies available Required items: required blood products, implants, devices, and special equipment available Patient identity confirmed: verbally with patient Time out: Immediately prior to procedure a  "time out" was called to verify the correct patient, procedure, equipment, support staff and site/side marked as required. Body area: head/neck Location details: scalp Laceration length: 2 cm Tendon involvement: none Nerve involvement: none Vascular damage: no Local anesthetic: topical anesthetic Anesthetic total: 2 ml Patient sedated: no Preparation: Patient was prepped and draped in the usual sterile fashion. Irrigation solution: saline Irrigation method: syringe Amount of cleaning: standard Debridement: none Degree of undermining: none Skin closure: staples Number of sutures: 5 Technique: simple Approximation: close Approximation difficulty: simple Dressing: antibiotic ointment, 4x4 sterile gauze and gauze roll Patient tolerance: Patient tolerated the procedure well with no immediate complications.   (including critical care time) Labs Review Labs Reviewed - No data to display  Imaging Review Ct Head Wo Contrast  05/30/2014   CLINICAL DATA:  [place] yesterday.  Initial evaluation.  EXAM: CT HEAD WITHOUT CONTRAST  CT CERVICAL SPINE WITHOUT CONTRAST  TECHNIQUE: Multidetector CT imaging of the head and cervical spine was performed following the standard protocol without intravenous contrast. Multiplanar CT image reconstructions of the cervical spine were also generated.  COMPARISON:  CT 11/17/2013.  FINDINGS: CT HEAD FINDINGS  No intra-axial or extra-axial pathologic for with left collection. Basal ganglia calcification. Diffuse white changes consistent chronic ischemic diffuse cerebral and cerebellar atrophy. No acute bony abnormality  CT CERVICAL SPINE FINDINGS  Soft tissue  structures are unremarkable. Atherosclerotic vascular calcification noted of the carotid arteries. Apical pleural parenchymal thickening noted most consistent with scarring. Shotty cervical lymph nodes present. Diffuse degenerative changes noted of the cervical spine. Degenerative changes are severe. No evidence of  fracture or dislocation.  IMPRESSION: 1. No acute intracranial abnormality. Chronic ischemic change and cerebral and cerebellar atrophy . 2. No acute cervical spine abnormality.  Severe degenerative change.   Electronically Signed   By: Marcello Moores  Register   On: 05/30/2014 18:12   Ct Cervical Spine Wo Contrast  05/30/2014   CLINICAL DATA:  [place] yesterday.  Initial evaluation.  EXAM: CT HEAD WITHOUT CONTRAST  CT CERVICAL SPINE WITHOUT CONTRAST  TECHNIQUE: Multidetector CT imaging of the head and cervical spine was performed following the standard protocol without intravenous contrast. Multiplanar CT image reconstructions of the cervical spine were also generated.  COMPARISON:  CT 11/17/2013.  FINDINGS: CT HEAD FINDINGS  No intra-axial or extra-axial pathologic for with left collection. Basal ganglia calcification. Diffuse white changes consistent chronic ischemic diffuse cerebral and cerebellar atrophy. No acute bony abnormality  CT CERVICAL SPINE FINDINGS  Soft tissue structures are unremarkable. Atherosclerotic vascular calcification noted of the carotid arteries. Apical pleural parenchymal thickening noted most consistent with scarring. Shotty cervical lymph nodes present. Diffuse degenerative changes noted of the cervical spine. Degenerative changes are severe. No evidence of fracture or dislocation.  IMPRESSION: 1. No acute intracranial abnormality. Chronic ischemic change and cerebral and cerebellar atrophy . 2. No acute cervical spine abnormality.  Severe degenerative change.   Electronically Signed   By: Marcello Moores  Register   On: 05/30/2014 18:12     EKG Interpretation None      MDM   Final diagnoses:  Fall at home, initial encounter  Scalp laceration, initial encounter    Pt is a 80 y.o. male with Pmhx as above who presents with mechanical fall with posterior scalp laceration last night around 10:30 PM. Patient and family deny altered mental status, headache, nausea, vomiting, neck pain. On  physical exam vital signs are stable and he is in no acute distress. Neuro exam is grossly unremarkable he has no other signs of acute trauma. Given age and daily aspirin use, CT head/c-spine ordered and was negative. Per chart review he is up-to-date on tetanus. Laceration irrigated and stapled given continued oozing. Infection precautions given. Return precautions given for new or worsening symptoms including development of headache, altered mental status, nausea, vomiting, numbness, weakness.Ernestina Patches, MD 05/31/14 [place], MD 05/31/14 1126

## 2014-05-30 NOTE — Telephone Encounter (Signed)
Pt's wife called and stated that her husband had a fall during the night and hit his head. There was a cut and active bleeding. Pt wanted to come in to be seen for fall follow up today. Juliann Pulse took the call and given the info provided she appropriately tried to reach CAN and wife hung up after holding 10 mins. Wife called back and I got her to agree to hold while I tried CAN.  While I was holding I had Juliann Pulse to call the patients daughter b/c wife stated that she would not be able to drive him to the hospital. Daughter, [name], was not home but her husband was. He said that they went to the patients home after the fall last night and it appeared to be more of an abrasion than a cut. Son in law said they just wanted to see a MD to alleviate their anxiety. Wife did not want to continue holding for CAN and I had no 30 min openings except with [place]. I asked wife to continue to hold and spoke with [place]. She took the time to review pt's info and felt that pt did need to go to the ER given his Hx and age. I explained this to the wife and advised her to take him to the ER. Also called son in law back and went over with him. He expressed understanding but seemed reluctant.

## 2014-06-02 ENCOUNTER — Other Ambulatory Visit: Payer: Self-pay | Admitting: Family Medicine

## 2014-06-07 ENCOUNTER — Ambulatory Visit (INDEPENDENT_AMBULATORY_CARE_PROVIDER_SITE_OTHER): Payer: Medicare Other | Admitting: Family Medicine

## 2014-06-07 ENCOUNTER — Encounter: Payer: Self-pay | Admitting: Family Medicine

## 2014-06-07 VITALS — BP 130/70 | HR 90 | Wt 174.0 lb

## 2014-06-07 DIAGNOSIS — S0101XD Laceration without foreign body of scalp, subsequent encounter: Secondary | ICD-10-CM

## 2014-06-07 DIAGNOSIS — L989 Disorder of the skin and subcutaneous tissue, unspecified: Secondary | ICD-10-CM

## 2014-06-07 NOTE — Progress Notes (Signed)
   Subjective:    Patient ID: Roger [name], male    DOB: 12/28/1919, 80 y.o.   MRN: [id number]  Suture / Staple Removal   ER followup. Patient fell on 05/29/2014. He struck his head with laceration occipital area. He had CT neck and CT head which were unremarkable. He did not have any cognitive changes or headache. No loss of consciousness. He had 6 staples placed and is here for removal today.  Patient also mentions right nasal skin lesion which is growing slowly over the past year. Treated previously with liquid nitrogen but did not resolve.  Past Medical History  Diagnosis Date  . CARCINOMA, SKIN, SQUAMOUS CELL 05/26/2008  . GERD 02/22/2007  . HYPERLIPIDEMIA 02/22/2007  . HYPERTENSION 06/17/2007  . MACULAR DEGENERATION 02/22/2007  . OSTEOARTHRITIS, HIP, RIGHT 06/05/2009   Past Surgical History  Procedure Laterality Date  . Laminectomy  1988    reports that he has quit smoking. He does not have any smokeless tobacco history on file. He reports that he does not drink alcohol. His drug history is not on file. family history includes Breast cancer in his daughter. Allergies  Allergen Reactions  . Amlodipine Besylate     REACTION: swelling of feet  . Aspirin     REACTION: GI upset can tolerate ecotrin  . Hydrocodone     GI upset      Review of Systems  Constitutional: Negative for fever and chills.  Gastrointestinal: Negative for nausea and vomiting.  Neurological: Negative for dizziness and headaches.       Objective:   Physical Exam  Constitutional: He appears well-developed and well-nourished.  HENT:  Patient has thick eschar occipital area. 6 Staples in Pl. and these were removed without difficulty. No signs of secondary infection  Neck: Neck supple.  Cardiovascular: Normal rate.   Pulmonary/Chest: Effort normal and breath sounds normal. No respiratory distress. He has no wheezes. He has no rales.  Skin:  Right side of nose patient has thickened hyperkeratotic  skin lesion about 6 mm diameter. No ulceration          Assessment & Plan:  Laceration occipital scalp. No signs of secondary infection. 6 Staples removed without difficulty. We recommended that he go ahead and resume shampooing and followup for any signs of secondary infection  Right nasal skin lesion. Thickened and hyperkeratotic. Recommend skin surgery Center for further evaluation with referral made

## 2014-06-07 NOTE — Progress Notes (Signed)
Pre visit review using our clinic review tool, if applicable. No additional management support is needed unless otherwise documented below in the visit note. 

## 2014-06-19 ENCOUNTER — Other Ambulatory Visit: Payer: Self-pay | Admitting: Internal Medicine

## 2014-06-21 ENCOUNTER — Ambulatory Visit: Payer: Medicare Other | Admitting: Family Medicine

## 2014-07-13 ENCOUNTER — Ambulatory Visit (INDEPENDENT_AMBULATORY_CARE_PROVIDER_SITE_OTHER): Payer: Medicare Other | Admitting: Family Medicine

## 2014-07-13 ENCOUNTER — Encounter: Payer: Self-pay | Admitting: Family Medicine

## 2014-07-13 VITALS — BP 134/82 | HR 82 | Temp 98.1°F | Wt 175.0 lb

## 2014-07-13 DIAGNOSIS — N183 Chronic kidney disease, stage 3 unspecified: Secondary | ICD-10-CM | POA: Insufficient documentation

## 2014-07-13 DIAGNOSIS — K219 Gastro-esophageal reflux disease without esophagitis: Secondary | ICD-10-CM

## 2014-07-13 DIAGNOSIS — Z23 Encounter for immunization: Secondary | ICD-10-CM

## 2014-07-13 DIAGNOSIS — N3281 Overactive bladder: Secondary | ICD-10-CM | POA: Insufficient documentation

## 2014-07-13 NOTE — Assessment & Plan Note (Signed)
I am concerned for silent GERD as cause of hoarseness/throat clearing issues as well. Trial omeprazole 20mg  BID x 1 month. Patient would like to see an allergist and we discussed 1 month trial omeprazole, zyrtec, flonase all together and if no relief-willing to refer though I think likely will be low yield. Could also consider CXR which I also think would be low yield.

## 2014-07-13 NOTE — Assessment & Plan Note (Signed)
Previously with trouble peeing frequently now with slow and weak stream. Previous BPH eval by Dr. Elease Hashimoto and thought enlarged prostate not central issue. Given new issues, we will stop the oxybutynin 2.5mg  to allow contracture of bladder to assist with flow. Follow up 1 month.

## 2014-07-13 NOTE — Progress Notes (Signed)
Roger Reddish, MD Phone: [phone]  Subjective:  Patient presents today to establish care with me as their new primary care provider. Patient was formerly a patient of Dr. Leanne Chang. Chief complaint-noted.   Phlegm/congestion -Coughed for half hour and coughed up 1/2 a cup of phlegm (glue like yellowish). Had this problem for over a year since before Christmas. Cannot seem to get a handle on it. Dr. Leanne Chang started with nasal sprays. Dr. Elease Hashimoto added generic of zyrtec. Hoarseness/raspiness with the phlegm. Did seem to get better with omeprazole ROS- no chest pain or shortness of breath  Overactive Bladder Slow and weak stream. Evaluated by Dr. Elease Hashimoto and told that nothing wrong with prostate per patient. No longer can stand up to urinate. Taking oxybutynin. Last 2-3 weeks feels like urine has slowed up some.  ROS- no polyuria or dysuria  The following were reviewed and entered/updated in epic: Past Medical History  Diagnosis Date  . CARCINOMA, SKIN, SQUAMOUS CELL 05/26/2008  . GERD 02/22/2007  . HYPERLIPIDEMIA 02/22/2007  . HYPERTENSION 06/17/2007  . MACULAR DEGENERATION 02/22/2007  . OSTEOARTHRITIS, HIP, RIGHT 06/05/2009   Patient Active Problem List   Diagnosis Date Noted  . CKD (chronic kidney disease), stage III 07/13/2014    Priority: Medium  . Overactive bladder 07/13/2014    Priority: Medium  . Syncope 11/17/2013    Priority: Medium  . Hypertension 06/17/2007    Priority: Medium  . Hyperlipidemia 02/22/2007    Priority: Medium  . Macular degeneration 06/19/2011    Priority: Low  . Osteoarthritis 06/05/2009    Priority: Low  . CARCINOMA, SKIN, SQUAMOUS CELL 05/26/2008    Priority: Low  . GERD (gastroesophageal reflux disease) 02/22/2007    Priority: Low   Past Surgical History  Procedure Laterality Date  . Laminectomy  1988    Family History  Problem Relation Age of Onset  . Breast cancer Daughter     Medications- reviewed and updated Current  Outpatient Prescriptions  Medication Sig Dispense Refill  . aspirin (ECOTRIN) 325 MG EC tablet Take 325 mg by mouth daily.    . cetirizine (ZYRTEC) 10 MG tablet TAKE 1 TABLET BY MOUTH EVERY DAY 30 tablet 8  . furosemide (LASIX) 20 MG tablet TAKE 1 TABLET EVERY DAY 30 tablet 3  . losartan (COZAAR) 100 MG tablet TAKE 1 TABLET BY MOUTH EVERY DAY 90 tablet 0  . lovastatin (MEVACOR) 40 MG tablet TAKE 1 TABLET BY MOUTH EVERY DAY 90 tablet 2  . omeprazole (PRILOSEC) 20 MG capsule TAKE ONE CAPSULE BY MOUTH EVERY DAY 30 capsule 5  . oxybutynin (DITROPAN) 5 MG tablet TAKE 1 TABLET EVERY DAY 90 tablet 2  . traMADol (ULTRAM) 50 MG tablet TAKE 1 TO 2 TABLETS BY MOUTH EVERY 6 HOURS AS NEEDED FOR PAIN 60 tablet 3  . [DISCONTINUED] diltiazem (CARDIZEM CD) 180 MG 24 hr capsule Take 180 mg by mouth daily.     . [DISCONTINUED] hydrochlorothiazide (MICROZIDE) 12.5 MG capsule Take 1 capsule (12.5 mg total) by mouth daily. 30 capsule 11  . [DISCONTINUED] oxybutynin (DITROPAN) 5 MG tablet TAKE 1/2 TABLET EVERY DAY 30 tablet 1   No current facility-administered medications for this visit.    Allergies-reviewed and updated Allergies  Allergen Reactions  . Amlodipine Besylate     REACTION: swelling of feet  . Aspirin     REACTION: GI upset can tolerate ecotrin  . Hydrocodone     GI upset    History   Social History  . Marital Status:  Married    Spouse Name: N/A    Number of Children: N/A  . Years of Education: N/A   Social History Main Topics  . Smoking status: Former Research scientist (life sciences)  . Smokeless tobacco: None  . Alcohol Use: No  . Drug Use: None  . Sexual Activity: None   Other Topics Concern  . None   Social History Narrative   Married (wife Wendee Copp in [place] practice) 1947. 2 [place] oldest daughter to breast cancer. 4 grandkids.       Patient and wife live with daughter. In basement-with lift.        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    Full Code.            ROS--See HPI   Objective: BP 134/82 mmHg  Pulse 82  Temp(Src) 98.1 F (36.7 C) (Oral)  Wt 175 lb (79.379 kg)  SpO2 97% Gen: NAD, resting comfortably HEENT: throat clear, no coughing during exam CV: RRR no murmurs rubs or gallops Lungs: CTAB no crackles, wheeze, rhonchi Ext: no edema Skin: warm, dry, does have a slow healing wound on upper scalp-advised to let dry   Assessment/Plan:  GERD (gastroesophageal reflux disease) I am concerned for silent GERD as cause of hoarseness/throat clearing issues as well. Trial omeprazole 20mg  BID x 1 month. Patient would like to see an allergist and we discussed 1 month trial omeprazole, zyrtec, flonase all together and if no relief-willing to refer though I think likely will be low yield. Could also consider CXR which I also think would be low yield.   Overactive bladder Previously with trouble peeing frequently now with slow and weak stream. Previous BPH eval by Dr. Elease Hashimoto and thought enlarged prostate not central issue. Given new issues, we will stop the oxybutynin 2.5mg  to allow contracture of bladder to assist with flow. Follow up 1 month.   Return precautions advised.   Orders Placed This Encounter  Procedures  . Flu vaccine HIGH DOSE PF (Fluzone Tri High dose)

## 2014-07-13 NOTE — Patient Instructions (Signed)
Stop oxybutynin  Take omeprazole twice a day for 1 month  Also restart flonase  Continue cetirizine  See me in 1 month or sooner if symptoms worsen or new ones appear. We will consider your allergist referral although Just to be honest, I am not sure we will be able to solve your issue with this.

## 2014-08-03 ENCOUNTER — Other Ambulatory Visit: Payer: Self-pay | Admitting: Family Medicine

## 2014-08-03 ENCOUNTER — Other Ambulatory Visit: Payer: Self-pay | Admitting: Internal Medicine

## 2014-08-03 NOTE — Telephone Encounter (Signed)
Rx called in to pharmacy. 

## 2014-08-03 NOTE — Telephone Encounter (Signed)
May fill 

## 2014-08-11 ENCOUNTER — Other Ambulatory Visit: Payer: Self-pay | Admitting: Family Medicine

## 2014-08-11 ENCOUNTER — Ambulatory Visit: Payer: Medicare Other | Admitting: Family Medicine

## 2014-08-11 MED ORDER — OMEPRAZOLE 20 MG PO CPDR: 20.0000 mg | DELAYED_RELEASE_CAPSULE | Freq: Two times a day (BID) | ORAL | Status: DC

## 2014-09-22 ENCOUNTER — Telehealth: Payer: Self-pay | Admitting: Family Medicine

## 2014-09-22 NOTE — Telephone Encounter (Signed)
Yes, patient was supposed to stop medication and follow up in 1 month.

## 2014-09-22 NOTE — Telephone Encounter (Signed)
Dr. Yong Channel please advise. On 11/15 OV you told pt to stop Oxybutynin.

## 2014-09-22 NOTE — Telephone Encounter (Signed)
Pt wife is questioning oxybutynin. Pt wife stated dr swords had him to take half of pill instead on 1 whole pill. Please verify [facility]

## 2014-09-23 ENCOUNTER — Other Ambulatory Visit: Payer: Self-pay | Admitting: Internal Medicine

## 2014-09-23 NOTE — Telephone Encounter (Signed)
Mrs. Roger [name] see Dr. Yong Channel note below and schedule pt for a f/u visit.

## 2014-09-25 ENCOUNTER — Telehealth: Payer: Self-pay

## 2014-09-25 NOTE — Telephone Encounter (Signed)
appt has been sch

## 2014-09-25 NOTE — Telephone Encounter (Signed)
Mrs. Malachy Mood please schedule pt for a 47month f/u any time in the afternoon works best for pt.

## 2014-09-27 ENCOUNTER — Ambulatory Visit: Payer: Self-pay | Admitting: Family Medicine

## 2014-09-27 NOTE — Telephone Encounter (Signed)
Pt has an appt today at 1:45 if he still need a appt within a month have him stopped up front and schedule

## 2014-10-11 ENCOUNTER — Encounter: Payer: Self-pay | Admitting: Family Medicine

## 2014-10-11 ENCOUNTER — Ambulatory Visit (INDEPENDENT_AMBULATORY_CARE_PROVIDER_SITE_OTHER): Payer: Medicare Other | Admitting: Family Medicine

## 2014-10-11 VITALS — BP 136/80 | Temp 97.5°F | Wt 172.0 lb

## 2014-10-11 DIAGNOSIS — R053 Chronic cough: Secondary | ICD-10-CM | POA: Insufficient documentation

## 2014-10-11 DIAGNOSIS — R198 Other specified symptoms and signs involving the digestive system and abdomen: Secondary | ICD-10-CM

## 2014-10-11 DIAGNOSIS — N3281 Overactive bladder: Secondary | ICD-10-CM

## 2014-10-11 DIAGNOSIS — R6889 Other general symptoms and signs: Secondary | ICD-10-CM

## 2014-10-11 DIAGNOSIS — R0989 Other specified symptoms and signs involving the circulatory and respiratory systems: Secondary | ICD-10-CM

## 2014-10-11 DIAGNOSIS — R05 Cough: Secondary | ICD-10-CM | POA: Insufficient documentation

## 2014-10-11 MED ORDER — FLUTICASONE PROPIONATE 50 MCG/ACT NA SUSP: 2.0000 | Freq: Every day | NASAL | Status: DC

## 2014-10-11 MED ORDER — OXYBUTYNIN CHLORIDE 5 MG PO TABS: 2.5000 mg | ORAL_TABLET | Freq: Every day | ORAL | Status: DC

## 2014-10-11 NOTE — Assessment & Plan Note (Signed)
We trialed off of medication but symptoms were not tolerable. Restart at 2.5 mg by patient-I agreed to continue and refilled.

## 2014-10-11 NOTE — Patient Instructions (Signed)
Throat clearing/hoarseness/phlegm  Continue omeprazole twice a day for now since this helped  Continue zyrtec  Add flonase back to regimen  Call me in 1 month if symptoms persist and can send to ENT  Continue oxybutynin 2.5mg 

## 2014-10-11 NOTE — Assessment & Plan Note (Signed)
Phlegm/throat clearing/hoarseness- chronic issue for patient for over a year. Has tried zyrtec, multiple inhaled steroids, PPI. Most recently we increased to omeprazole 20mg  BID with about 50% improvement in symptoms. Symptoms persist. Offered ENT referral vs. Start flonase along with zyrtec he is already taking and continue PPI in case there are 2 issues going on (allergies and reflux). Patient opts for flonase and will call in 1 month if no improvement. Ok for referral to be placed for ENT if calls back.

## 2014-10-11 NOTE — Progress Notes (Signed)
Garret Reddish, MD Phone: [phone]  Subjective:   Roger [name] is a 81 y.o. year old very pleasant male patient who presents with the following:  overactive bladder- controlled 07/13/14 reported Has slow and weak stream. Had prostate exam per Dr. Elease Hashimoto previously.  Taking oxybutynin 2.5 mg. We trialed off medication in November but patient had sudden urges as well as incontinence so he restarted ROS- slow, weak stream but steady, much improved urgency on medication and no incontinence.   Phlegm/throat clearing/hoarseness Tried omeprazole twice a day for a month with mild improvement on this about 50%. Today, reports no phlegm when he took it last night.  ROS- no unintentional weight loss. Sometimes with thick glob of phlegm will have trouble swallowing until he coughts this up.   Past Medical History- Patient Active Problem List   Diagnosis Date Noted  . CKD (chronic kidney disease), stage III 07/13/2014    Priority: Medium  . Overactive bladder 07/13/2014    Priority: Medium  . Syncope 11/17/2013    Priority: Medium  . Hypertension 06/17/2007    Priority: Medium  . Hyperlipidemia 02/22/2007    Priority: Medium  . Macular degeneration 06/19/2011    Priority: Low  . Osteoarthritis 06/05/2009    Priority: Low  . CARCINOMA, SKIN, SQUAMOUS CELL 05/26/2008    Priority: Low  . GERD (gastroesophageal reflux disease) 02/22/2007    Priority: Low  . Chronic throat clearing 10/11/2014   Medications- reviewed and updated Current Outpatient Prescriptions  Medication Sig Dispense Refill  . aspirin (ECOTRIN) 325 MG EC tablet Take 325 mg by mouth daily.    . cetirizine (ZYRTEC) 10 MG tablet TAKE 1 TABLET BY MOUTH EVERY DAY 30 tablet 8  . furosemide (LASIX) 20 MG tablet TAKE 1 TABLET BY MOUTH EVERY DAY 30 tablet 3  . losartan (COZAAR) 100 MG tablet TAKE 1 TABLET BY MOUTH EVERY DAY 90 tablet 1  . lovastatin (MEVACOR) 40 MG tablet TAKE 1 TABLET BY MOUTH EVERY DAY 90 tablet 2    . omeprazole (PRILOSEC) 20 MG capsule Take 1 capsule (20 mg total) by mouth 2 (two) times daily before a meal. 180 capsule 3  . oxybutynin (DITROPAN) 5 MG tablet Take 0.5 tablets (2.5 mg total) by mouth daily. 45 tablet 3  . traMADol (ULTRAM) 50 MG tablet TAKE 1 TO 2 TABLETS BY MOUTH EVERY 6 HOURS AS NEEDED FOR PAIN (Patient not taking: Reported on 10/11/2014) 60 tablet 1   Objective: BP 136/80 mmHg  Temp(Src) 97.5 F (36.4 C)  Wt 172 lb (78.019 kg) Gen: NAD, resting comfortably in chair but slumped over CV: RRR no murmurs rubs or gallops Lungs: CTAB no crackles, wheeze, rhonchi  Ext: 1+ pitting edema Skin: warm, dry No suprapubic tenderness Neuro: grossly normal, moves all extremities, walks with walker very slowly   Assessment/Plan:  Overactive bladder We trialed off of medication but symptoms were not tolerable. Restart at 2.5 mg by patient-I agreed to continue and refilled.    Chronic throat clearing Phlegm/throat clearing/hoarseness- chronic issue for patient for over a year. Has tried zyrtec, multiple inhaled steroids, PPI. Most recently we increased to omeprazole 20mg  BID with about 50% improvement in symptoms. Symptoms persist. Offered ENT referral vs. Start flonase along with zyrtec he is already taking and continue PPI in case there are 2 issues going on (allergies and reflux). Patient opts for flonase and will call in 1 month if no improvement. Ok for referral to be placed for ENT if calls back.  Return precautions advised. Offered 3 month follow up-patient will call when wants to be seen.   Meds ordered this encounter  Medications  . oxybutynin (DITROPAN) 5 MG tablet    Sig: Take 0.5 tablets (2.5 mg total) by mouth daily.    Dispense:  45 tablet    Refill:  3  . fluticasone (FLONASE) 50 MCG/ACT nasal spray    Sig: Place 2 sprays into both nostrils daily.    Dispense:  16 g    Refill:  6

## 2014-11-08 ENCOUNTER — Other Ambulatory Visit: Payer: Self-pay | Admitting: *Deleted

## 2014-11-08 MED ORDER — FUROSEMIDE 20 MG PO TABS: 20.0000 mg | ORAL_TABLET | Freq: Every day | ORAL | Status: DC

## 2014-11-18 ENCOUNTER — Other Ambulatory Visit: Payer: Self-pay | Admitting: Family Medicine

## 2014-11-21 ENCOUNTER — Telehealth: Payer: Self-pay | Admitting: Family Medicine

## 2014-11-21 NOTE — Telephone Encounter (Signed)
done

## 2014-11-21 NOTE — Telephone Encounter (Signed)
That's fine

## 2014-11-21 NOTE — Telephone Encounter (Signed)
Patient's wife called stating patient is still having issues with [name] and mucus in his throat.  Wife would like to have patient come in one day next week, early afternoon.  Can I use a SDA for patient?

## 2014-11-28 ENCOUNTER — Ambulatory Visit (INDEPENDENT_AMBULATORY_CARE_PROVIDER_SITE_OTHER): Payer: Medicare Other | Admitting: Family Medicine

## 2014-11-28 ENCOUNTER — Encounter: Payer: Self-pay | Admitting: Family Medicine

## 2014-11-28 VITALS — BP 130/72 | HR 76 | Temp 97.6°F | Wt 174.0 lb

## 2014-11-28 DIAGNOSIS — R0989 Other specified symptoms and signs involving the circulatory and respiratory systems: Secondary | ICD-10-CM

## 2014-11-28 DIAGNOSIS — R6889 Other general symptoms and signs: Secondary | ICD-10-CM

## 2014-11-28 DIAGNOSIS — R198 Other specified symptoms and signs involving the digestive system and abdomen: Secondary | ICD-10-CM

## 2014-11-28 DIAGNOSIS — Z23 Encounter for immunization: Secondary | ICD-10-CM

## 2014-11-28 DIAGNOSIS — E785 Hyperlipidemia, unspecified: Secondary | ICD-10-CM

## 2014-11-28 NOTE — Patient Instructions (Addendum)
Received final pneumonia shot today ([id number]).  We will call you within a week about your referral to ENT about your chronic throat clearing issue. If you do not hear within 2 weeks, give [place] a call.   Continue your omeprazole twice a day, flonase, and zyrtec until you see them.   Lovastatin is still a good choice although data for benefits outweighing risks at your age is not clear without having a prior heart attack or stroke.   Elevate the legs or use compression stockings for your legs.

## 2014-11-28 NOTE — Assessment & Plan Note (Signed)
S: read article that statins do not work and he may need an injectable.  A/P: we are not regularly checking his lipids and I have discussed with him primary prevention risk/benefit analysis is not clear at this age. He would like to continue medication. I told him that I absolutely would not suggest changing to injectables at this time though.

## 2014-11-28 NOTE — Assessment & Plan Note (Signed)
With coughing fits. From a chronic cough perspective- Did not clear with BID PPI (improved about 50% previously reported) for GERD, no clerance with zyrtec and flonase for allergic rhinitis, not on ace-i but is on ARB, no history of asthma.   Has been trying different therapies through PCP for over a year, we will refer to ENT at this time for further opinion. I wonder if he may eventually need EGD on the other hand but think ENT is good first stop for eval.

## 2014-11-28 NOTE — Progress Notes (Signed)
Garret Reddish, MD Phone: [phone]  Subjective:   Roger [name] is a 81 y.o. year old very pleasant male patient who presents with the following:  Chronic throat clearing -Chronic issue for at least a year if not 2 years. He complains of thick mucus at night and coughing fits. He feels like his throat is full of mucus. In the daytime he denies difficulty swallowing either liquids or solids. He does have intermittent issues with mucus in the day as well. Is been trialed on omeprazole 20 mg twice a day while at the same time been treated with Flonase and Zyrtec to see if there was a component of both GERD and allergic rhinitis. He initially had some improvement but over time is not noted improvement to the level of his liking. We have discussed previously referring to ENT and he is interested in pursuing that at this time. ROS- no dysphagia, no unintentional weight loss, no worsening fatigue.no chest pain or shortness of breath.   Past Medical History- Patient Active Problem List   Diagnosis Date Noted  . CKD (chronic kidney disease), stage III 07/13/2014    Priority: Medium  . Overactive bladder 07/13/2014    Priority: Medium  . Syncope 11/17/2013    Priority: Medium  . Hypertension 06/17/2007    Priority: Medium  . Hyperlipidemia 02/22/2007    Priority: Medium  . Macular degeneration 06/19/2011    Priority: Low  . Osteoarthritis 06/05/2009    Priority: Low  . CARCINOMA, SKIN, SQUAMOUS CELL 05/26/2008    Priority: Low  . GERD (gastroesophageal reflux disease) 02/22/2007    Priority: Low  . Chronic throat clearing 10/11/2014   Medications- reviewed and updated Current Outpatient Prescriptions  Medication Sig Dispense Refill  . aspirin (ECOTRIN) 325 MG EC tablet Take 325 mg by mouth daily.    . cetirizine (ZYRTEC) 10 MG tablet TAKE 1 TABLET BY MOUTH EVERY DAY 30 tablet 8  . furosemide (LASIX) 20 MG tablet Take 1 tablet (20 mg total) by mouth daily. 90 tablet 1  . losartan  (COZAAR) 100 MG tablet TAKE 1 TABLET BY MOUTH EVERY DAY 90 tablet 1  . lovastatin (MEVACOR) 40 MG tablet TAKE 1 TABLET BY MOUTH EVERY DAY 90 tablet 2  . omeprazole (PRILOSEC) 20 MG capsule Take 1 capsule (20 mg total) by mouth 2 (two) times daily before a meal. 180 capsule 3  . oxybutynin (DITROPAN) 5 MG tablet Take 0.5 tablets (2.5 mg total) by mouth daily. 45 tablet 3  . fluticasone (FLONASE) 50 MCG/ACT nasal spray Place 2 sprays into both nostrils daily. (Patient not taking: Reported on 11/28/2014) 16 g 6  . traMADol (ULTRAM) 50 MG tablet TAKE 1 TO 2 TABLETS BY MOUTH EVERY 6 HOURS AS NEEDED FOR PAIN (Patient not taking: Reported on 11/28/2014) 60 tablet 1  . [DISCONTINUED] diltiazem (CARDIZEM CD) 180 MG 24 hr capsule Take 180 mg by mouth daily.     . [DISCONTINUED] hydrochlorothiazide (MICROZIDE) 12.5 MG capsule Take 1 capsule (12.5 mg total) by mouth daily. 30 capsule 11  . [DISCONTINUED] oxybutynin (DITROPAN) 5 MG tablet TAKE 1/2 TABLET EVERY DAY 30 tablet 1     Objective: BP 130/72 mmHg  Pulse 76  Temp(Src) 97.6 F (36.4 C)  Wt 174 lb (78.926 kg) Gen: NAD, resting comfortably in chair, walks very slowly with aid of walker Hearing aids in place, bilateral nasal turbinates normal, oropharynx largely normal CV: RRR no murmurs rubs or gallops Lungs: CTAB no crackles, wheeze, rhonchi Abdomen: soft/nontender/nondistended/normal  bowel sounds.  Ext: 1+ pitting edema bilaterally Skin: warm, dry, no rash   Assessment/Plan:  Chronic throat clearing With coughing fits. From a chronic cough perspective- Did not clear with BID PPI (improved about 50% previously reported) for GERD, no clerance with zyrtec and flonase for allergic rhinitis, not on ace-i but is on ARB, no history of asthma.   Has been trying different therapies through PCP for over a year, we will refer to ENT at this time for further opinion. I wonder if he may eventually need EGD on the other hand but think ENT is good first stop  for eval.    Hyperlipidemia S: read article that statins do not work and he may need an injectable.  A/P: we are not regularly checking his lipids and I have discussed with him primary prevention risk/benefit analysis is not clear at this age. He would like to continue medication. I told him that I absolutely would not suggest changing to injectables at this time though.      Orders Placed This Encounter  Procedures  . Pneumococcal conjugate vaccine 13-valent  . Ambulatory referral to ENT    Referral Priority:  Routine    Referral Type:  Consultation    Referral Reason:  Specialty Services Required    Requested Specialty:  Otolaryngology    Number of Visits Requested:  1

## 2014-12-06 ENCOUNTER — Other Ambulatory Visit: Payer: Self-pay | Admitting: Dermatology

## 2014-12-06 DIAGNOSIS — D0420 Carcinoma in situ of skin of unspecified ear and external auricular canal: Secondary | ICD-10-CM | POA: Diagnosis not present

## 2014-12-06 DIAGNOSIS — L570 Actinic keratosis: Secondary | ICD-10-CM | POA: Diagnosis not present

## 2014-12-06 DIAGNOSIS — D0421 Carcinoma in situ of skin of right ear and external auricular canal: Secondary | ICD-10-CM | POA: Diagnosis not present

## 2015-01-06 ENCOUNTER — Other Ambulatory Visit: Payer: Self-pay | Admitting: Internal Medicine

## 2015-01-25 ENCOUNTER — Telehealth: Payer: Self-pay | Admitting: Family Medicine

## 2015-01-25 NOTE — Telephone Encounter (Signed)
Would they have a ride on the following week (week after next). If not, can use the SDA slot although this is not preferable.

## 2015-01-25 NOTE — Telephone Encounter (Signed)
Is this ok?

## 2015-01-25 NOTE — Telephone Encounter (Signed)
Would really like to keep a same day available for same day use.  Can we schedule him for next Tuesday? Would wife and patient be ok with that? We can provide a refill over weekend if desired.

## 2015-01-25 NOTE — Telephone Encounter (Signed)
Pt wife is requesting an appt for her husband for tomorrow to discuss pain med. C an I use last sda slot?

## 2015-01-25 NOTE — Telephone Encounter (Signed)
Pt has a ride for tomorrow and not next week

## 2015-01-25 NOTE — Telephone Encounter (Signed)
Pt has sch °

## 2015-01-26 ENCOUNTER — Encounter: Payer: Self-pay | Admitting: Family Medicine

## 2015-01-26 ENCOUNTER — Ambulatory Visit (INDEPENDENT_AMBULATORY_CARE_PROVIDER_SITE_OTHER): Payer: Medicare Other | Admitting: Family Medicine

## 2015-01-26 VITALS — BP 120/70 | HR 84 | Temp 97.3°F | Wt 173.0 lb

## 2015-01-26 DIAGNOSIS — M150 Primary generalized (osteo)arthritis: Secondary | ICD-10-CM

## 2015-01-26 DIAGNOSIS — I872 Venous insufficiency (chronic) (peripheral): Secondary | ICD-10-CM | POA: Diagnosis not present

## 2015-01-26 DIAGNOSIS — M129 Arthropathy, unspecified: Secondary | ICD-10-CM | POA: Diagnosis not present

## 2015-01-26 DIAGNOSIS — M159 Polyosteoarthritis, unspecified: Secondary | ICD-10-CM

## 2015-01-26 DIAGNOSIS — M1711 Unilateral primary osteoarthritis, right knee: Secondary | ICD-10-CM

## 2015-01-26 MED ORDER — METHYLPREDNISOLONE ACETATE 80 MG/ML IJ SUSP
80.0000 mg | Freq: Once | INTRAMUSCULAR | Status: AC
  Administered 2015-01-26: 80 mg via INTRAMUSCULAR

## 2015-01-26 NOTE — Progress Notes (Signed)
Roger Reddish, MD  Subjective:  Roger [name] is a 81 y.o. year old very pleasant male patient who presents with:  R knee osteoarthritis- poor control -severe 7/10 aching pain making it hard for him to walk. Worse than his R hip pain which also bothers him. He gets constipated on tramadol or narcotics, tylenol does not help, nsaids not ideal with CKD stage III and cardiac risk. Wants to discuss other options. Did have an injection 3-4 years ago before going to wedding that helped him enjoy the wedding, though not sure how long affects lasted.  ROS- no hot, swollen joint at the knee. Denies leg weakness. Does have chronic leg swelling  Past Medical History- HLD, HTN, CKD III, oxybutynin 2.5mg , chronic throat clearing  Medications- reviewed and updated Current Outpatient Prescriptions  Medication Sig Dispense Refill  . aspirin (ECOTRIN) 325 MG EC tablet Take 325 mg by mouth daily.    . cetirizine (ZYRTEC) 10 MG tablet TAKE 1 TABLET BY MOUTH EVERY DAY 30 tablet 8  . fluticasone (FLONASE) 50 MCG/ACT nasal spray Place 2 sprays into both nostrils daily. 16 g 6  . furosemide (LASIX) 20 MG tablet Take 1 tablet (20 mg total) by mouth daily. 90 tablet 1  . losartan (COZAAR) 100 MG tablet TAKE 1 TABLET BY MOUTH EVERY DAY 90 tablet 1  . lovastatin (MEVACOR) 40 MG tablet TAKE 1 TABLET BY MOUTH EVERY DAY 90 tablet 2  . omeprazole (PRILOSEC) 20 MG capsule Take 1 capsule (20 mg total) by mouth 2 (two) times daily before a meal. 180 capsule 3  . oxybutynin (DITROPAN) 5 MG tablet Take 0.5 tablets (2.5 mg total) by mouth daily. 45 tablet 3  . traMADol (ULTRAM) 50 MG tablet TAKE 1 TO 2 TABLETS BY MOUTH EVERY 6 HOURS AS NEEDED FOR PAIN (Patient not taking: Reported on 11/28/2014) 60 tablet 1  . [DISCONTINUED] diltiazem (CARDIZEM CD) 180 MG 24 hr capsule Take 180 mg by mouth daily.     . [DISCONTINUED] hydrochlorothiazide (MICROZIDE) 12.5 MG capsule Take 1 capsule (12.5 mg total) by mouth daily. 30 capsule 11  .  [DISCONTINUED] oxybutynin (DITROPAN) 5 MG tablet TAKE 1/2 TABLET EVERY DAY 30 tablet 1   No current facility-administered medications for this visit.    Objective: BP 120/70 mmHg  Pulse 84  Temp(Src) 97.3 F (36.3 C) (Oral)  Wt 173 lb (78.472 kg) Gen: NAD, resting comfortably in chair Hunched over, antalgic gait with walking down hall with rolling walker CV: RRR no murmurs rubs or gallops Lungs: CTAB no crackles, wheeze, rhonchi Ext: 1+ pitting edema B Skin: warm, dry, some venous stasis changes  R Knee: Normal to inspection with no erythema or effusion or obvious bony abnormalities. Palpation normal with no warmth. Medial joint line tenderness noted.  ROM normal in flexion but limited in extension  Ligaments with solid consistent endpoints including ACL, PCL, LCL, MCL. Slight baker's cyst Patellar and quadriceps tendons unremarkable. Hamstring and quadriceps strength is normal.  Procedure note Consent obtained and verified verbally Sterile betadine prep. Furthur cleansed with alcohol. Topical analgesic spray: Ethyl chloride. Joint: R knee Approached in typical fashion with: medial Completed without difficulty Meds: 3 cc lidocaine no epi, 1 ccm 80mg /cc methylprednisolone Needle: 1 1/2 inch 25 gauge Aftercare instructions and Red flags advised.  Assessment/Plan:  Venous (peripheral) insufficiency S: swelling bothersome, needs prescription for stockings as used to have 30 mmhg stockings A/P: rx provided and encouraged daily use as long as no pain in legs with them  on.   Osteoarthritis R knee osteoarthritis- poor control Injection provided today with >50 % improvement in pain. Other options limited (Tylenol- no help, NSaids- don't use with CKD, and heart risk, Tramadol/narcotics- constipation). Discussed could use injection every 4 months if needed.   Hopeful this also helps through systemic absorption with hip pain R.   Strict return precautions advised.   Also  mentions to me holding off on ENT as throat clearing issue better.   Meds ordered this encounter  Medications  . methylPREDNISolone acetate (DEPO-MEDROL) injection 80 mg    Sig:

## 2015-01-26 NOTE — Patient Instructions (Addendum)
Gave script for compression stockings- take to [facility]  Glad throat clearing is better- no ENT visit ok as long as you are tolerating current symptoms  R knee arthritis  Trial injection  Should kick in within 48 hours  May have some increased pain tonight, can ice knee if needed  Follow up if worsening pain, swelling, redness around knee  Hopeful this helps you for at least 3 months

## 2015-01-26 NOTE — Assessment & Plan Note (Signed)
R knee osteoarthritis- poor control Injection provided today with >50 % improvement in pain. Other options limited (Tylenol- no help, NSaids- don't use with CKD, and heart risk, Tramadol/narcotics- constipation). Discussed could use injection every 4 months if needed.   Hopeful this also helps through systemic absorption with hip pain R.

## 2015-01-26 NOTE — Progress Notes (Signed)
Pre visit review using our clinic review tool, if applicable. No additional management support is needed unless otherwise documented below in the visit note. 

## 2015-01-26 NOTE — Assessment & Plan Note (Signed)
S: swelling bothersome, needs prescription for stockings as used to have 30 mmhg stockings A/P: rx provided and encouraged daily use as long as no pain in legs with them on.

## 2015-01-31 ENCOUNTER — Other Ambulatory Visit: Payer: Self-pay | Admitting: Family Medicine

## 2015-02-05 ENCOUNTER — Other Ambulatory Visit: Payer: Self-pay | Admitting: Family Medicine

## 2015-03-02 ENCOUNTER — Telehealth: Payer: Self-pay | Admitting: Family Medicine

## 2015-03-02 ENCOUNTER — Ambulatory Visit (INDEPENDENT_AMBULATORY_CARE_PROVIDER_SITE_OTHER)
Admission: RE | Admit: 2015-03-02 | Discharge: 2015-03-02 | Disposition: A | Discharge status: Home / Self Care | Payer: Medicare Other | Source: Ambulatory Visit | Attending: Family Medicine | Admitting: Family Medicine

## 2015-03-02 ENCOUNTER — Encounter: Payer: Self-pay | Admitting: Family Medicine

## 2015-03-02 ENCOUNTER — Other Ambulatory Visit: Payer: Self-pay | Admitting: Family Medicine

## 2015-03-02 ENCOUNTER — Ambulatory Visit (INDEPENDENT_AMBULATORY_CARE_PROVIDER_SITE_OTHER): Payer: Medicare Other | Admitting: Family Medicine

## 2015-03-02 VITALS — BP 140/90 | HR 101 | Temp 98.7°F

## 2015-03-02 DIAGNOSIS — R05 Cough: Secondary | ICD-10-CM

## 2015-03-02 DIAGNOSIS — R059 Cough, unspecified: Secondary | ICD-10-CM

## 2015-03-02 DIAGNOSIS — J189 Pneumonia, unspecified organism: Secondary | ICD-10-CM

## 2015-03-02 DIAGNOSIS — R531 Weakness: Secondary | ICD-10-CM

## 2015-03-02 DIAGNOSIS — R509 Fever, unspecified: Secondary | ICD-10-CM | POA: Diagnosis not present

## 2015-03-02 LAB — CBC WITH DIFFERENTIAL/PLATELET
BASOS ABS: 0 10*3/uL (ref 0.0–0.1)
BASOS PCT: 0.2 % (ref 0.0–3.0)
EOS PCT: 1.5 % (ref 0.0–5.0)
Eosinophils Absolute: 0.1 10*3/uL (ref 0.0–0.7)
HEMATOCRIT: 36.4 % — AB (ref 39.0–52.0)
HEMOGLOBIN: 12.1 g/dL — AB (ref 13.0–17.0)
LYMPHS ABS: 0.6 10*3/uL — AB (ref 0.7–4.0)
Lymphocytes Relative: 6.9 % — ABNORMAL LOW (ref 12.0–46.0)
MCHC: 33.4 g/dL (ref 30.0–36.0)
MCV: 94 fl (ref 78.0–100.0)
Monocytes Absolute: 0.2 10*3/uL (ref 0.1–1.0)
Monocytes Relative: 2.6 % — ABNORMAL LOW (ref 3.0–12.0)
Neutro Abs: 7.7 10*3/uL (ref 1.4–7.7)
Platelets: 156 10*3/uL (ref 150.0–400.0)
RBC: 3.87 Mil/uL — AB (ref 4.22–5.81)
RDW: 14 % (ref 11.5–15.5)
WBC: 8.6 10*3/uL (ref 4.0–10.5)

## 2015-03-02 LAB — COMPREHENSIVE METABOLIC PANEL
ALBUMIN: 3.9 g/dL (ref 3.5–5.2)
ALK PHOS: 72 U/L (ref 39–117)
ALT: 15 U/L (ref 0–53)
AST: 24 U/L (ref 0–37)
BUN: 29 mg/dL — ABNORMAL HIGH (ref 6–23)
CHLORIDE: 104 meq/L (ref 96–112)
CO2: 27 mEq/L (ref 19–32)
CREATININE: 1.54 mg/dL — AB (ref 0.40–1.50)
Calcium: 9.3 mg/dL (ref 8.4–10.5)
GFR: 44.99 mL/min — AB (ref >60.00)
GLUCOSE: 117 mg/dL — AB (ref 70–99)
POTASSIUM: 4 meq/L (ref 3.5–5.1)
Sodium: 138 mEq/L (ref 135–145)
Total Bilirubin: 0.6 mg/dL (ref 0.2–1.2)
Total Protein: 7.6 g/dL (ref 6.0–8.3)

## 2015-03-02 MED ORDER — LEVOFLOXACIN 750 MG PO TABS: 750.0000 mg | ORAL_TABLET | Freq: Every day | ORAL | Status: DC

## 2015-03-02 NOTE — Telephone Encounter (Signed)
Patient has appointment at 2:15 today

## 2015-03-02 NOTE — Progress Notes (Addendum)
Garret Reddish, MD  Subjective:  Roger [name] is a 81 y.o. year old very pleasant male patient who presents with:  Weakness/fever/cough/runny nose/fall - runny nose for a few days. Thursday felt run down/tired. Tried to get up yesterday and felt extremely weak. Tried to walk and fell down. EMS came and checked him out and got him back into bed. 30 minutes later went to bathroom and could not get up. EMS came back out and had a temperature of 101. Got back to bed. DId eat dinner. This morning felt better and temperature at 97.6 this AM. Had a normal breakfast and lunch.   Lost balance before he came here today and shoe got stuck. Fall today was leaning over incident where foot got stuck. EMS had to bring him to office.   ROS- no chest pain, shortness of breath. Mild cough from chronic throat clearing that is improving. Some decreased appetite and PO>   Past Medical History- CKD III, HLD, HTN, overactive bladder, chronic throat clearing, venous insufficiency  Medications- reviewed and updated Current Outpatient Prescriptions  Medication Sig Dispense Refill  . aspirin (ECOTRIN) 325 MG EC tablet Take 325 mg by mouth daily.    . cetirizine (ZYRTEC) 10 MG tablet TAKE 1 TABLET BY MOUTH EVERY DAY 30 tablet 8  . furosemide (LASIX) 20 MG tablet Take 1 tablet (20 mg total) by mouth daily. 90 tablet 1  . losartan (COZAAR) 100 MG tablet TAKE 1 TABLET BY MOUTH EVERY DAY 90 tablet 1  . lovastatin (MEVACOR) 40 MG tablet TAKE 1 TABLET BY MOUTH EVERY DAY 90 tablet 2  . omeprazole (PRILOSEC) 20 MG capsule Take 1 capsule (20 mg total) by mouth 2 (two) times daily before a meal. 180 capsule 3  . oxybutynin (DITROPAN) 5 MG tablet Take 0.5 tablets (2.5 mg total) by mouth daily. 45 tablet 3  . fluticasone (FLONASE) 50 MCG/ACT nasal spray Place 2 sprays into both nostrils daily. (Patient not taking: Reported on 03/02/2015) 16 g 6  . [DISCONTINUED] diltiazem (CARDIZEM CD) 180 MG 24 hr capsule Take 180 mg by mouth  daily.     . [DISCONTINUED] hydrochlorothiazide (MICROZIDE) 12.5 MG capsule Take 1 capsule (12.5 mg total) by mouth daily. 30 capsule 11  . [DISCONTINUED] oxybutynin (DITROPAN) 5 MG tablet TAKE 1/2 TABLET EVERY DAY 30 tablet 1   Objective: BP 140/90 mmHg  Pulse 101  Temp(Src) 98.7 F (37.1 C)  Wt   SpO2 94% Gen: NAD, resting comfortably in chair Mild runny nose, no sinus tenderness, did not examine ears, oropharynx normal CV: slightly tachycardic but regular,  no murmurs rubs or gallops Lungs: CTAB no crackles, wheeze, rhonchi Abdomen: soft/nontender/nondistended/normal bowel sounds. No rebound or guarding.  No CVA tenderness Ext: trace edema, not under compression stockings as normal Skin: warm, dry, several bruises noted on arms and legs Neuro: grossly normal, moves all extremities, sitting in wheelchair   Results for orders placed or performed in visit on 03/02/15 (from the past 24 hour(s))  CBC with Differential/Platelet     Status: Abnormal   Collection Time: 03/02/15  3:20 PM  Result Value Ref Range   WBC 8.6 4.0 - 10.5 K/uL   RBC 3.87 (L) 4.22 - 5.81 Mil/uL   Hemoglobin 12.1 (L) 13.0 - 17.0 g/dL   HCT 36.4 (L) 39.0 - 52.0 %   MCV 94.0 78.0 - 100.0 fl   MCHC 33.4 30.0 - 36.0 g/dL   RDW 14.0 11.5 - 15.5 %   Platelets 156.0 150.0 -  400.0 K/uL   Neutrophils Relative % 88.8 Repeated and verified X2. (H) 43.0 - 77.0 %   Lymphocytes Relative 6.9 Repeated and verified X2. (L) 12.0 - 46.0 %   Monocytes Relative 2.6 (L) 3.0 - 12.0 %   Eosinophils Relative 1.5 0.0 - 5.0 %   Basophils Relative 0.2 0.0 - 3.0 %   Neutro Abs 7.7 1.4 - 7.7 K/uL   Lymphs Abs 0.6 (L) 0.7 - 4.0 K/uL   Monocytes Absolute 0.2 0.1 - 1.0 K/uL   Eosinophils Absolute 0.1 0.0 - 0.7 K/uL   Basophils Absolute 0.0 0.0 - 0.1 K/uL   Narrative   Reviewed smear  Comprehensive metabolic panel     Status: Abnormal   Collection Time: 03/02/15  3:20 PM  Result Value Ref Range   Sodium 138 135 - 145 mEq/L    Potassium 4.0 3.5 - 5.1 mEq/L   Chloride 104 96 - 112 mEq/L   CO2 27 19 - 32 mEq/L   Glucose, Bld 117 (H) 70 - 99 mg/dL   BUN 29 (H) 6 - 23 mg/dL   Creatinine, Ser 1.54 (H) 0.40 - 1.50 mg/dL   Total Bilirubin 0.6 0.2 - 1.2 mg/dL   Alkaline Phosphatase 72 39 - 117 U/L   AST 24 0 - 37 U/L   ALT 15 0 - 53 U/L   Total Protein 7.6 6.0 - 8.3 g/dL   Albumin 3.9 3.5 - 5.2 g/dL   Calcium 9.3 8.4 - 10.5 mg/dL   GFR 44.99 (L) >60.00 mL/min   Dg Chest 1 View  03/02/2015   CLINICAL DATA:  Cough and fever.  EXAM: CHEST  1 VIEW  COMPARISON:  11/19/2014  FINDINGS: Heart size normal. Bibasilar subsegmental atelectasis and/or infiltrates are present. Eventration of the diaphragms with eventration of the colon under the diaphragms noted. Similar finding noted on prior exam. To completely exclude bowel distention or free air abdominal series should be obtained. No acute bony abnormality.  IMPRESSION: 1. Low lung volumes with bibasilar subsegmental atelectasis and/or infiltrates .  2. Eventration hemidiaphragms with interposition of colon under the hemidiaphragms. Similar findings noted on prior exam . To exclude exclude bowel distention or free air abdominal series should be obtained These results will be called to the ordering clinician or representative by the Radiologist Assistant, and communication documented in the PACS or zVision Dashboard.   Electronically Signed   By: Marcello Moores  Register   On: 03/02/2015 16:45   Assessment/Plan:  Weakness/fever/cough/runny nose/fall CAP as cause- highest on differential Given left shift on CBC, fever yesterday, runny nose and cough, potential infiltrates on x-ray (only able to do 1 view sitting) we are going to treat aggressively with levaquin 750mg  dialy for 7 days (could use augmentin and zpak but family worried about GI intolerance on augmentin) also given age and comorbidities tend to think levaquin better choice. Discussed with daughter by phone I do not think this is  crystal clear but would rather be aggressive and try to keep him out of the hospital. Asked her to touch base with me Monday to see how he is doing  No GI complaints and we did not opt to further explore GI imaging concerns. Doubt this is location of left shift or fever though intraabdominal infection would be possible.   Meds ordered this encounter  Medications  . levofloxacin (LEVAQUIN) 750 MG tablet    Sig: Take 1 tablet (750 mg total) by mouth daily.    Dispense:  7 tablet  Refill:  0    Addendum: believe patient would benefit from home health services due to weakness: RN/PT/OT/home aide ordered

## 2015-03-02 NOTE — Patient Instructions (Signed)
Fever, runny nose, cough, fall  Want to make sure no pneumonia- go to [facility] for x-ray  Check some basic labs to evaluate infection fighting cells  If had new or recurrent symptoms over weekend, seek care

## 2015-03-02 NOTE — Telephone Encounter (Signed)
Patient Name: Roger [name] DOB: Jun 13, 1920 Initial Comment caller states father's legs are weak, he fell yesterday and had a fever 101 Nurse Assessment Nurse: Marcelline Deist, RN, Lynda Date/Time (Eastern Time): 03/02/2015 10:29:49 AM Confirm and document reason for call. If symptomatic, describe symptoms. ---Caller states father's legs are weak, he fell yesterday. He had a fever of 101. They called 911 after he fell, they checked him & everything seemed fine. No injuries, he sunk to ground. Later, when they had got him to the commode, they couldn't get him up, so had to call 911 again. EMS thought the fever might be causing his weakness. Noticed he had a fever, gave him Tylenol. Seemed to feel better, appetite improved. Keeping him in bed. Has the patient traveled out of the country within the last 30 days? ---Not Applicable Does the patient require triage? ---Yes Related visit to physician within the last 2 weeks? ---No Does the PT have any chronic conditions? (i.e. diabetes, asthma, etc.) ---Yes List chronic conditions. ---arthritis, on a diuretic Guidelines Guideline Title Affirmed Question Affirmed Notes Weakness (Generalized) and Fatigue [1] MODERATE weakness (i.e., interferes with work, school, normal activities) AND [2] cause unknown (Exceptions: weakness with acute minor illness, or weakness from poor fluid intake) Final Disposition User See Physician within 4 Hours (or PCP triage) Marcelline Deist, RN, Lynda Comments Caller states they have not tried to get patient out of bed today, although he feels much better. They may try to see if he can take a few steps, or may call a service to bring him to office for check-up. No fever today, no urinary symptoms.

## 2015-03-05 ENCOUNTER — Telehealth: Payer: Self-pay | Admitting: Family Medicine

## 2015-03-05 MED ORDER — BENZONATATE 100 MG PO CAPS: 100.0000 mg | ORAL_CAPSULE | Freq: Two times a day (BID) | ORAL | Status: DC | PRN

## 2015-03-05 NOTE — Telephone Encounter (Signed)
See below

## 2015-03-05 NOTE — Telephone Encounter (Signed)
Ordered home health services : RN/PT/OT/health aide  Sent in tessalon pearls for cough  Apolonio Schneiders please inform family-

## 2015-03-05 NOTE — Telephone Encounter (Signed)
Roger [name] w/ [employer] called w/ pt on the on the line to request pt have assistance w/ his everyday living  Pt is in a rolator all day and cannot stand up  for more than a couple of minutes. Pt colapsed after returning home friday from dr appts. Wife called EMS to help him into bed and he has been there in bed every since. Pt has fallen 4 times since last wed.  Pt needs help getting to and from dr appt now as well.  Wife is 89 and she has been the one getting him ready for appts, into car and into the dr office. But wife states pt has gotten so weak, she is unable  to continue w/ this process. pls advise!

## 2015-03-05 NOTE — Addendum Note (Signed)
Addended by: [name] on: 03/05/2015 06:24 PM   Modules accepted: Orders

## 2015-03-05 NOTE — Telephone Encounter (Signed)
[facility] - Client [place] [facility]  Patient Name: DEMARI GALES  DOB: 1919-10-07    Initial Comment Caller states father was seen Friday w/ possible s/s of pneumonia, he is getting a phlegm chest cough, was given ABX friday, wants to know what to give for cough    Nurse Assessment  Nurse: Wynetta Emery, RN, Baker Janus Date/Time Eilene Ghazi Time): 03/05/2015 9:47:48 AM  Confirm and document reason for call. If symptomatic, describe symptoms. ---[name] was seen in the office Friday given an antibiotic has a cough with this that has kept him awake requesting a cough syrup and home health services for ADL's Request: 1) cough syrup 2) [place] for ADL's  Has the patient traveled out of the country within the last 30 days? ---No  Does the patient require triage? ---Yes  Related visit to physician within the last 2 weeks? ---Yes   MD office on Friday Dx; possible PNA antibiotic therapy given and started  Does the PT have any chronic conditions? (i.e. diabetes, asthma, etc.) ---Unknown     Guidelines    Guideline Title Affirmed Question Affirmed Notes  Cough - Acute Productive Cough (all triage questions negative)    REQUEST:  [place] Final Disposition User   [place], RN, Baker Janus

## 2015-03-06 NOTE — Telephone Encounter (Signed)
Pt called for an update on the [facility] services. Was informed about the orders placed and the rx for the cough.

## 2015-03-08 ENCOUNTER — Encounter (HOSPITAL_COMMUNITY): Payer: Self-pay | Admitting: Emergency Medicine

## 2015-03-08 ENCOUNTER — Telehealth: Payer: Self-pay | Admitting: Family Medicine

## 2015-03-08 ENCOUNTER — Emergency Department (HOSPITAL_COMMUNITY): Payer: Medicare Other

## 2015-03-08 ENCOUNTER — Inpatient Hospital Stay (HOSPITAL_COMMUNITY)
Admission: EM | Admit: 2015-03-08 | Discharge: 2015-03-12 | DRG: 193 | Disposition: A | Discharge status: Skilled Nursing Facility | Payer: Medicare Other | Attending: Internal Medicine | Admitting: Internal Medicine

## 2015-03-08 DIAGNOSIS — R7989 Other specified abnormal findings of blood chemistry: Secondary | ICD-10-CM | POA: Diagnosis present

## 2015-03-08 DIAGNOSIS — R0602 Shortness of breath: Secondary | ICD-10-CM | POA: Diagnosis not present

## 2015-03-08 DIAGNOSIS — N3281 Overactive bladder: Secondary | ICD-10-CM | POA: Diagnosis not present

## 2015-03-08 DIAGNOSIS — K219 Gastro-esophageal reflux disease without esophagitis: Secondary | ICD-10-CM | POA: Diagnosis not present

## 2015-03-08 DIAGNOSIS — E785 Hyperlipidemia, unspecified: Secondary | ICD-10-CM | POA: Diagnosis not present

## 2015-03-08 DIAGNOSIS — I5032 Chronic diastolic (congestive) heart failure: Secondary | ICD-10-CM | POA: Diagnosis present

## 2015-03-08 DIAGNOSIS — D649 Anemia, unspecified: Secondary | ICD-10-CM | POA: Diagnosis not present

## 2015-03-08 DIAGNOSIS — R262 Difficulty in walking, not elsewhere classified: Secondary | ICD-10-CM | POA: Diagnosis not present

## 2015-03-08 DIAGNOSIS — Z87891 Personal history of nicotine dependence: Secondary | ICD-10-CM

## 2015-03-08 DIAGNOSIS — Z8701 Personal history of pneumonia (recurrent): Secondary | ICD-10-CM

## 2015-03-08 DIAGNOSIS — R278 Other lack of coordination: Secondary | ICD-10-CM | POA: Diagnosis not present

## 2015-03-08 DIAGNOSIS — J9601 Acute respiratory failure with hypoxia: Secondary | ICD-10-CM | POA: Diagnosis present

## 2015-03-08 DIAGNOSIS — I5030 Unspecified diastolic (congestive) heart failure: Secondary | ICD-10-CM | POA: Diagnosis not present

## 2015-03-08 DIAGNOSIS — N183 Chronic kidney disease, stage 3 unspecified: Secondary | ICD-10-CM | POA: Diagnosis present

## 2015-03-08 DIAGNOSIS — R791 Abnormal coagulation profile: Secondary | ICD-10-CM | POA: Diagnosis not present

## 2015-03-08 DIAGNOSIS — R0902 Hypoxemia: Secondary | ICD-10-CM

## 2015-03-08 DIAGNOSIS — I129 Hypertensive chronic kidney disease with stage 1 through stage 4 chronic kidney disease, or unspecified chronic kidney disease: Secondary | ICD-10-CM | POA: Diagnosis present

## 2015-03-08 DIAGNOSIS — Z7982 Long term (current) use of aspirin: Secondary | ICD-10-CM

## 2015-03-08 DIAGNOSIS — J180 Bronchopneumonia, unspecified organism: Principal | ICD-10-CM | POA: Diagnosis present

## 2015-03-08 DIAGNOSIS — J9811 Atelectasis: Secondary | ICD-10-CM | POA: Diagnosis present

## 2015-03-08 DIAGNOSIS — R296 Repeated falls: Secondary | ICD-10-CM | POA: Diagnosis present

## 2015-03-08 DIAGNOSIS — R531 Weakness: Secondary | ICD-10-CM | POA: Diagnosis not present

## 2015-03-08 DIAGNOSIS — I10 Essential (primary) hypertension: Secondary | ICD-10-CM | POA: Diagnosis present

## 2015-03-08 DIAGNOSIS — R404 Transient alteration of awareness: Secondary | ICD-10-CM | POA: Diagnosis not present

## 2015-03-08 DIAGNOSIS — Z800 Family history of malignant neoplasm of digestive organs: Secondary | ICD-10-CM | POA: Diagnosis not present

## 2015-03-08 DIAGNOSIS — R05 Cough: Secondary | ICD-10-CM | POA: Diagnosis not present

## 2015-03-08 DIAGNOSIS — R509 Fever, unspecified: Secondary | ICD-10-CM | POA: Diagnosis not present

## 2015-03-08 DIAGNOSIS — J189 Pneumonia, unspecified organism: Secondary | ICD-10-CM | POA: Diagnosis not present

## 2015-03-08 DIAGNOSIS — M6281 Muscle weakness (generalized): Secondary | ICD-10-CM | POA: Diagnosis not present

## 2015-03-08 LAB — COMPREHENSIVE METABOLIC PANEL
ALT: 37 U/L (ref 17–63)
AST: 38 U/L (ref 15–41)
Albumin: 2.7 g/dL — ABNORMAL LOW (ref 3.5–5.0)
Alkaline Phosphatase: 62 U/L (ref 38–126)
Anion gap: 8 (ref 5–15)
BUN: 58 mg/dL — ABNORMAL HIGH (ref 6–20)
CO2: 23 mmol/L (ref 22–32)
Calcium: 8 mg/dL — ABNORMAL LOW (ref 8.9–10.3)
Chloride: 114 mmol/L — ABNORMAL HIGH (ref 101–111)
Creatinine, Ser: 1.98 mg/dL — ABNORMAL HIGH (ref 0.61–1.24)
GFR calc Af Amer: 32 mL/min — ABNORMAL LOW (ref >60)
GFR calc non Af Amer: 27 mL/min — ABNORMAL LOW (ref >60)
Glucose, Bld: 89 mg/dL (ref 65–99)
Potassium: 4.1 mmol/L (ref 3.5–5.1)
Sodium: 145 mmol/L (ref 135–145)
Total Bilirubin: 0.9 mg/dL (ref 0.3–1.2)
Total Protein: 6.1 g/dL — ABNORMAL LOW (ref 6.5–8.1)

## 2015-03-08 LAB — CBC WITH DIFFERENTIAL/PLATELET
Basophils Absolute: 0 10*3/uL (ref 0.0–0.1)
Basophils Relative: 0 % (ref 0–1)
Eosinophils Absolute: 0.2 10*3/uL (ref 0.0–0.7)
Eosinophils Relative: 2 % (ref 0–5)
HCT: 33.6 % — ABNORMAL LOW (ref 39.0–52.0)
Hemoglobin: 11 g/dL — ABNORMAL LOW (ref 13.0–17.0)
Lymphocytes Relative: 11 % — ABNORMAL LOW (ref 12–46)
Lymphs Abs: 1 10*3/uL (ref 0.7–4.0)
MCH: 31 pg (ref 26.0–34.0)
MCHC: 32.7 g/dL (ref 30.0–36.0)
MCV: 94.6 fL (ref 78.0–100.0)
Monocytes Absolute: 0.6 10*3/uL (ref 0.1–1.0)
Monocytes Relative: 6 % (ref 3–12)
Neutro Abs: 7.2 10*3/uL (ref 1.7–7.7)
Neutrophils Relative %: 81 % — ABNORMAL HIGH (ref 43–77)
Platelets: 179 10*3/uL (ref 150–400)
RBC: 3.55 MIL/uL — ABNORMAL LOW (ref 4.22–5.81)
RDW: 14.1 % (ref 11.5–15.5)
WBC: 9 10*3/uL (ref 4.0–10.5)

## 2015-03-08 LAB — URINE MICROSCOPIC-ADD ON

## 2015-03-08 LAB — URINALYSIS, ROUTINE W REFLEX MICROSCOPIC
Bilirubin Urine: NEGATIVE
Glucose, UA: NEGATIVE mg/dL
Hgb urine dipstick: NEGATIVE
Ketones, ur: NEGATIVE mg/dL
Leukocytes, UA: NEGATIVE
Nitrite: NEGATIVE
Protein, ur: 30 mg/dL — AB
Specific Gravity, Urine: 1.023 (ref 1.005–1.030)
Urobilinogen, UA: 0.2 mg/dL (ref 0.0–1.0)
pH: 5.5 (ref 5.0–8.0)

## 2015-03-08 LAB — BRAIN NATRIURETIC PEPTIDE: B Natriuretic Peptide: 390.6 pg/mL — ABNORMAL HIGH (ref 0.0–100.0)

## 2015-03-08 LAB — D-DIMER, QUANTITATIVE (NOT AT ARMC): D-Dimer, Quant: 11.57 ug/mL-FEU — ABNORMAL HIGH (ref 0.00–0.48)

## 2015-03-08 LAB — TROPONIN I: Troponin I: <0.03 ng/mL (ref <0.031)

## 2015-03-08 MED ORDER — ASPIRIN EC 325 MG PO TBEC: 325.0000 mg | DELAYED_RELEASE_TABLET | Freq: Every day | ORAL | Status: DC

## 2015-03-08 MED ORDER — BENZONATATE 100 MG PO CAPS: 100.0000 mg | ORAL_CAPSULE | Freq: Three times a day (TID) | ORAL | Status: DC | PRN

## 2015-03-08 MED ORDER — PRAVASTATIN SODIUM 40 MG PO TABS
40.0000 mg | ORAL_TABLET | Freq: Every day | ORAL | Status: DC
  Administered 2015-03-09 – 2015-03-11 (×3): 40 mg via ORAL
  Filled 2015-03-08 (×4): qty 1

## 2015-03-08 MED ORDER — ENOXAPARIN SODIUM 80 MG/0.8ML ~~LOC~~ SOLN
80.0000 mg | SUBCUTANEOUS | Status: DC
  Administered 2015-03-08: 80 mg via SUBCUTANEOUS
  Filled 2015-03-08 (×2): qty 0.8

## 2015-03-08 MED ORDER — GUAIFENESIN-DM 100-10 MG/5ML PO SYRP: 5.0000 mL | ORAL_SOLUTION | ORAL | Status: DC | PRN

## 2015-03-08 MED ORDER — SODIUM CHLORIDE 0.45 % IV SOLN
INTRAVENOUS | Status: AC
  Administered 2015-03-08 – 2015-03-09 (×2): via INTRAVENOUS

## 2015-03-08 MED ORDER — LOSARTAN POTASSIUM 50 MG PO TABS: 100.0000 mg | ORAL_TABLET | Freq: Every day | ORAL | Status: DC

## 2015-03-08 MED ORDER — PANTOPRAZOLE SODIUM 40 MG PO TBEC
40.0000 mg | DELAYED_RELEASE_TABLET | Freq: Every day | ORAL | Status: DC
  Administered 2015-03-09 – 2015-03-12 (×4): 40 mg via ORAL
  Filled 2015-03-08 (×5): qty 1

## 2015-03-08 MED ORDER — MAGNESIUM SULFATE 2 GM/50ML IV SOLN
2.0000 g | Freq: Once | INTRAVENOUS | Status: DC
  Filled 2015-03-08: qty 50

## 2015-03-08 MED ORDER — DOXYCYCLINE HYCLATE 100 MG PO TABS
100.0000 mg | ORAL_TABLET | Freq: Two times a day (BID) | ORAL | Status: DC
  Administered 2015-03-08 – 2015-03-12 (×8): 100 mg via ORAL
  Filled 2015-03-08 (×9): qty 1

## 2015-03-08 MED ORDER — IPRATROPIUM-ALBUTEROL 0.5-2.5 (3) MG/3ML IN SOLN
3.0000 mL | Freq: Three times a day (TID) | RESPIRATORY_TRACT | Status: DC
  Administered 2015-03-08 – 2015-03-09 (×2): 3 mL via RESPIRATORY_TRACT
  Filled 2015-03-08 (×2): qty 3

## 2015-03-08 MED ORDER — OXYBUTYNIN CHLORIDE 5 MG PO TABS
2.5000 mg | ORAL_TABLET | Freq: Every day | ORAL | Status: DC
  Administered 2015-03-09 – 2015-03-12 (×4): 2.5 mg via ORAL
  Filled 2015-03-08 (×2): qty 0.5
  Filled 2015-03-08: qty 1
  Filled 2015-03-08 (×2): qty 0.5

## 2015-03-08 MED ORDER — CEFTRIAXONE SODIUM IN DEXTROSE 20 MG/ML IV SOLN
1.0000 g | INTRAVENOUS | Status: DC
  Administered 2015-03-08: 1 g via INTRAVENOUS
  Filled 2015-03-08 (×2): qty 50

## 2015-03-08 MED ORDER — LORATADINE 10 MG PO TABS
10.0000 mg | ORAL_TABLET | Freq: Every day | ORAL | Status: DC
  Administered 2015-03-09 – 2015-03-12 (×4): 10 mg via ORAL
  Filled 2015-03-08 (×4): qty 1

## 2015-03-08 MED ORDER — SODIUM CHLORIDE 0.9 % IV BOLUS (SEPSIS)
1000.0000 mL | Freq: Once | INTRAVENOUS | Status: AC
  Administered 2015-03-08: 1000 mL via INTRAVENOUS

## 2015-03-08 MED ORDER — ONDANSETRON HCL 4 MG/2ML IJ SOLN: 4.0000 mg | Freq: Four times a day (QID) | INTRAMUSCULAR | Status: DC | PRN

## 2015-03-08 MED ORDER — ASPIRIN EC 325 MG PO TBEC
325.0000 mg | DELAYED_RELEASE_TABLET | Freq: Every day | ORAL | Status: DC
  Administered 2015-03-09 – 2015-03-12 (×4): 325 mg via ORAL
  Filled 2015-03-08 (×4): qty 1

## 2015-03-08 MED ORDER — METHYLPREDNISOLONE SODIUM SUCC 125 MG IJ SOLR
80.0000 mg | Freq: Once | INTRAMUSCULAR | Status: AC
  Administered 2015-03-08: 80 mg via INTRAVENOUS
  Filled 2015-03-08: qty 2

## 2015-03-08 MED ORDER — ONDANSETRON HCL 4 MG PO TABS: 4.0000 mg | ORAL_TABLET | Freq: Four times a day (QID) | ORAL | Status: DC | PRN

## 2015-03-08 MED ORDER — LOSARTAN POTASSIUM 50 MG PO TABS
100.0000 mg | ORAL_TABLET | Freq: Every day | ORAL | Status: DC
  Administered 2015-03-09 – 2015-03-12 (×4): 100 mg via ORAL
  Filled 2015-03-08 (×4): qty 2

## 2015-03-08 NOTE — ED Notes (Signed)
Bed: WA20 Expected date:  Expected time:  Means of arrival:  Comments: EMS- 81yo M, weakness

## 2015-03-08 NOTE — Telephone Encounter (Signed)
I spoke with the daughter and she states that the patient's cough is not improving and the medication is not working.  She states that he is coughing more at night and that he is getting weak.   I have informed Dr Yong Channel and waiting for his response.

## 2015-03-08 NOTE — Progress Notes (Addendum)
EDCM called [facility] and spoke to [place], transition care specialist who confirms they did receive the referral but unable to take this patient due to insurance is out of network.  EDCM spoke to patient and his wife and daughter at bedside.  Patient lives at home with his wife daughter and her husband.  Patient does nto have home health services at this time and never has.  Patient has a walker, rolator, elevated toilet seat, shower chair, renting a hospital for amonth and taking back a manual wheelchair tomorrow.  Patient also has a stair lift at home.  Patient is able to shower himself, needs assistance with meals and dressing.  Patient noted to be wearing oxygen in the ED, patient does not wear oxygen at home.  EDCM informed patient and family that [facility] is unable to accept him due to insurance reasons.  EDCM provided patient's daughter with list of home health agencies in [facility], explained services.  Patient's family thankful for services.  No further case management needs at this time.

## 2015-03-08 NOTE — ED Provider Notes (Signed)
The patient is a 81 year old male, he has had intermittent respiratory complaints and recently was treated for pneumonia with 7 days of Levaquin. This was done through the doctor's office. Over the last several days there has been increased episodes of coughing, frequent falls, generalized weakness. On exam the patient has rhonchorous breath sounds, decreased lung sounds in all lung fields, able to speak in shortened sentences, not using accessory muscles, no increased work of breathing. Oxygen of 93-94% on 2 L.  Medical screening examination/treatment/procedure(s) were conducted as a shared visit with non-physician practitioner(s) and myself.  I personally evaluated the patient during the encounter.  Clinical Impression:   Final diagnoses:  SOB (shortness of breath)         Noemi Chapel, MD 03/13/15 1954

## 2015-03-08 NOTE — Telephone Encounter (Signed)
I am concerned about patient. He may need admission potentially and services beyond hospitalization (SNF) given extreme assist he has required recurrently.

## 2015-03-08 NOTE — ED Provider Notes (Signed)
CSN: [id number]     Arrival date & time 03/08/15  1355 History   First MD Initiated Contact with Patient 03/08/15 1506     Chief Complaint  Patient presents with  . Weakness     (Consider location/radiation/quality/duration/timing/severity/associated sxs/prior Treatment) HPI Patient presents to the emergency department with weakness and fatigue with coughing for the last week.  Patient states that had fever associated with this.  Patient states that he was seen by his primary care doctor, as well.  Patient states that nothing seems make his condition, better or worse.  Patient denies chest pain, nausea, vomiting, dizziness, headache, blurred vision, back pain, neck pain, abdominal pain, diarrhea, or syncope.  The patient states that he has had some fevers noted at home.  Patient's doctor treated him presumptively for pneumonia. Past Medical History  Diagnosis Date  . CARCINOMA, SKIN, SQUAMOUS CELL 05/26/2008  . GERD 02/22/2007  . HYPERLIPIDEMIA 02/22/2007  . HYPERTENSION 06/17/2007  . MACULAR DEGENERATION 02/22/2007  . OSTEOARTHRITIS, HIP, RIGHT 06/05/2009   Past Surgical History  Procedure Laterality Date  . Laminectomy  1988   Family History  Problem Relation Age of Onset  . Breast cancer Daughter    History  Substance Use Topics  . Smoking status: Former Research scientist (life sciences)  . Smokeless tobacco: Not on file  . Alcohol Use: No    Review of Systems  All other systems negative except as documented in the HPI. All pertinent positives and negatives as reviewed in the HPI.  Allergies  Amlodipine besylate; Aspirin; and Hydrocodone  Home Medications   Prior to Admission medications   Medication Sig Start Date End Date Taking? Authorizing Provider  aspirin (ECOTRIN) 325 MG EC tablet Take 325 mg by mouth daily.   Yes Historical Provider, MD  benzonatate (TESSALON) 100 MG capsule Take 1 capsule (100 mg total) by mouth 2 (two) times daily as needed for cough. 03/05/15  Yes [name], MD   cetirizine (ZYRTEC) 10 MG tablet TAKE 1 TABLET BY MOUTH EVERY DAY 11/20/14  Yes [name], MD  fluticasone [facility]) 50 MCG/ACT nasal spray Place 2 sprays into both nostrils daily. Patient taking differently: Place 2 sprays into both nostrils daily as needed for allergies.  10/11/14  Yes [name], MD  furosemide (LASIX) 20 MG tablet Take 1 tablet (20 mg total) by mouth daily. 11/08/14  Yes [name], MD  losartan (COZAAR) 100 MG tablet TAKE 1 TABLET BY MOUTH EVERY DAY 02/01/15  Yes [name], MD  lovastatin (MEVACOR) 40 MG tablet TAKE 1 TABLET BY MOUTH EVERY DAY 01/08/15  Yes [name], MD  Multiple Vitamins-Minerals (MULTIVITAMIN WITH MINERALS) tablet Take 1 tablet by mouth daily.   Yes Historical Provider, MD  omeprazole (PRILOSEC) 20 MG capsule Take 1 capsule (20 mg total) by mouth 2 (two) times daily before a meal. 08/11/14  Yes [name], MD  oxybutynin (DITROPAN) 5 MG tablet Take 0.5 tablets (2.5 mg total) by mouth daily. 10/11/14  Yes [name], MD  levofloxacin (LEVAQUIN) 750 MG tablet Take 1 tablet (750 mg total) by mouth daily. Patient not taking: Reported on 03/08/2015 03/02/15   [name], MD   BP 124/79 mmHg  Pulse 91  Temp(Src) 97.7 F (36.5 C) (Oral)  Resp 21  SpO2 93% Physical Exam  Constitutional: He is oriented to person, place, and time. He appears well-developed and well-nourished. No distress.  HENT:  Head: Normocephalic and atraumatic.  Mouth/Throat: Oropharynx is clear  and moist.  Eyes: Pupils are equal, round, and reactive to light.  Neck: Normal range of motion. Neck supple.  Cardiovascular: Normal rate, regular rhythm and normal heart sounds.  Exam reveals no gallop and no friction rub.   No murmur heard. Pulmonary/Chest: Effort normal and breath sounds normal. No respiratory distress.  Genitourinary: Guaiac negative stool.  Musculoskeletal: He exhibits no edema.  Neurological: He is alert and oriented to person,  place, and time. He exhibits normal muscle tone. Coordination normal.  Skin: Skin is warm and dry. No rash noted. No erythema.  Nursing note and vitals reviewed.   ED Course  Procedures (including critical care time) Labs Review Labs Reviewed  BRAIN NATRIURETIC PEPTIDE - Abnormal; Notable for the following:    B Natriuretic Peptide 390.6 (*)    All other components within normal limits  COMPREHENSIVE METABOLIC PANEL - Abnormal; Notable for the following:    Chloride 114 (*)    BUN 58 (*)    Creatinine, Ser 1.98 (*)    Calcium 8.0 (*)    Total Protein 6.1 (*)    Albumin 2.7 (*)    GFR calc non Af Amer 27 (*)    GFR calc Af Amer 32 (*)    All other components within normal limits  CBC WITH DIFFERENTIAL/PLATELET - Abnormal; Notable for the following:    RBC 3.55 (*)    Hemoglobin 11.0 (*)    HCT 33.6 (*)    Neutrophils Relative % 81 (*)    Lymphocytes Relative 11 (*)    All other components within normal limits  URINALYSIS, ROUTINE W REFLEX MICROSCOPIC (NOT AT [facility]) - Abnormal; Notable for the following:    APPearance CLOUDY (*)    Protein, ur 30 (*)    All other components within normal limits  D-DIMER, QUANTITATIVE (NOT AT [facility]) - Abnormal; Notable for the following:    D-Dimer, Quant 11.57 (*)    All other components within normal limits  TROPONIN I  URINE MICROSCOPIC-ADD ON    Imaging Review Dg Chest 2 View  03/08/2015   CLINICAL DATA:  Productive cough and fever for 1 week, shortness of Breath  EXAM: CHEST - 2 VIEW  COMPARISON:  03/02/2015  FINDINGS: Cardiac shadow is stable. Bibasilar changes are again identified stable from the previous exam. No new focal infiltrate is seen. No sizable effusion is noted. Compression deformity is noted at the thoracolumbar junction of uncertain chronicity.  IMPRESSION: Bibasilar atelectatic changes stable from the previous exam.  Compression deformity at the thoracolumbar junction of uncertain chronicity.   Electronically Signed   By:  Inez Catalina M.D.   On: 03/08/2015 15:52     EKG Interpretation   Date/Time:  Thursday March 08 2015 14:17:11 EDT Ventricular Rate:  94 PR Interval:  160 QRS Duration: 116 QT Interval:  387 QTC Calculation: 484 R Axis:   -36 Text Interpretation:  Sinus tachycardia Atrial premature complexes LVH  with IVCD, LAD and secondary repol abnrm Borderline prolonged QT interval  since last tracing no significant change Confirmed by MILLER  MD, BRIAN  [phone]) on 03/08/2015 4:08:51 PM        The patient will be admitted for generalized weakness, hypotension and an oxygen demand.  Patient is advised plan and all questions were answered.  I spoke with the [facility] who will be down to admit the patient  Dalia Heading, PA-C 03/12/15 0206  Noemi Chapel, MD 03/13/15 [phone]

## 2015-03-08 NOTE — Progress Notes (Signed)
ANTICOAGULATION CONSULT NOTE - Initial Consult  Pharmacy Consult for lovenox Indication: r/o VTE  Allergies  Allergen Reactions  . Amlodipine Besylate     REACTION: swelling of feet  . Aspirin     REACTION: GI upset can tolerate ecotrin  . Hydrocodone     GI upset    Patient Measurements: weight 78 kg, height 69 inches   Vital Signs: Temp: 97.7 F (36.5 C) (07/14 1410) Temp Source: Oral (07/14 1410) BP: 128/64 mmHg (07/14 1937) Pulse Rate: 98 (07/14 1937)  Labs:  Recent Labs  03/08/15 1727  HGB 11.0*  HCT 33.6*  PLT 179  CREATININE 1.98*  TROPONINI <0.03    CrCl cannot be calculated (Unknown ideal weight.).   Medical History: Past Medical History  Diagnosis Date  . CARCINOMA, SKIN, SQUAMOUS CELL 05/26/2008  . GERD 02/22/2007  . HYPERLIPIDEMIA 02/22/2007  . HYPERTENSION 06/17/2007  . MACULAR DEGENERATION 02/22/2007  . OSTEOARTHRITIS, HIP, RIGHT 06/05/2009    Medications:  See med rec  Assessment: Patient is a 81 y.o M who presents to the ED with c/o weakness and cough.  D-dimer is elevated at 11.57.  Chest CT angio pending.  To start lovenox for r/o VTE.  Scr elevated at 1.98 (est crcl~26).  CBC ok  Goal of Therapy:  Anti-Xa level 0.6-1 units/ml 4hrs after LMWH dose given Monitor platelets by anticoagulation protocol: Yes   Plan:  - lovenox 80 mg SQ q24h (adjusted for renal function) - cbc q72h - monitor cbc and renal function  Roger [name] P 03/08/2015,7:47 PM

## 2015-03-08 NOTE — ED Notes (Signed)
Per ems pt from home co weakness, pt reports cough and fever for 1 week for which pt has been seen and chest xray showing normal findings. Pt reports weakness and fatigue from coughing. Pt is alert and oriented.

## 2015-03-08 NOTE — Telephone Encounter (Signed)
Patient Name: Roger [name]  DOB: 1919-12-09    Initial Comment Caller states her father was prescribed medication for cough, has weakness from being unable to sleep    Nurse Assessment  Nurse: Mallie Mussel, RN, Alveta Heimlich Date/Time Eilene Ghazi Time): 03/08/2015 11:35:26 AM  Confirm and document reason for call. If symptomatic, describe symptoms. ---Caller states that her father has been sick since last week. He was seen and diagnosed with possible pneumonia and prescribed an antibiotic, Levofloxacin. A bacterial infection due to elevated WBC. He developed a cough and was prescribed Tessalon Pearles for the cough. They are not very helpful with the cough. He has been having trouble sleeping due to the cough. Denies fever at present. Denies coughing up blood or rusty colored sputum. He has labored breathing after a coughing spell. She states that it is almost continuous.  Has the patient traveled out of the country within the last 30 days? ---No  Does the patient require triage? ---Yes  Related visit to physician within the last 2 weeks? ---Yes  Does the PT have any chronic conditions? (i.e. diabetes, asthma, etc.) ---Yes  List chronic conditions. ---HTN, Arthritis     Guidelines    Guideline Title Affirmed Question Affirmed Notes  Infection on Antibiotic Follow-up Call MODERATE difficulty breathing (e.g., speaks in phrases, SOB even at rest, pulse 100-120)    Final Disposition User   Go to ED Now (or PCP triage) Mallie Mussel, RN, Alveta Heimlich    Comments  Caller became very upset with the outcome when iI told her that I would be making an appointment for her father. She went on about having 911 there 4 times this week in order to get him to [place], has tried the home health, and nobody is returning her calls. I called the backline and spoke with Estill Bamberg. She states to send the note on and she will have Sharyn Lull look it over and give the daughter a call back. Caller notified, verbalized understanding.   Disagree/Comply:  Disagree  Disagree/Comply Reason: Disagree with instructions

## 2015-03-08 NOTE — ED Notes (Signed)
Attempted report x1. 

## 2015-03-08 NOTE — H&P (Signed)
[facility] History and Physical  Roger [name] XNA:[id number] DOB: [birth year] DOA: 03/08/2015  Referring physician: Renold Don, PA. PCP: Garret Reddish, MD   Chief Complaint: Cough and shortness of breath  HPI: Roger [name] is a 81 y.o. male with below past medical history who is brought to the emergency department after having persistent cough for about a week. The patient went to see his PCP on Friday and was prescribed Levaquin orally, he seemed to be doing better by Monday, but then had a fever couple days ago, increased dyspnea, cough frequency and expectoration of brownish, occasionally clear sputum. He also feels a postnasal drip sometimes. He denies travel history of sick contacts. He had a pneumovax injection back in April. Patient was hypoxic in the 80s without supplemental oxygen, but is currently having an O2 saturation of 94% on 4 L/m nasal cannula.   He and his family also reports having some falls on Friday which had caused some ecchymotic areas and minor abrasions on his right sided extremities. The patient did not lose consciousness and denied any chest pain or dizziness at that time and an complaints he felt weak and fatigued. No further falls reported since then.   He looks acutely ill, but is not in any acute distress.    Review of Systems:  Constitutional:  No weight loss, negative night sweats, positive Fevers, positive chills, positive fatigue.  HEENT:  No headaches, Difficulty swallowing,Tooth/dental problems,Sore throat,  No sneezing, itching, ear ache, nasal congestion, positive post nasal drip,  Cardio-vascular:  No chest pain, Orthopnea, PND, swelling in lower extremities, anasarca, dizziness, palpitations  GI:  No heartburn, indigestion, abdominal pain, nausea, vomiting, diarrhea, change in bowel habits, positive loss of appetite  Resp:   shortness of breath with exertion or at rest. excess mucus, productive cough, no hemoptysis,  change in color of mucus.No wheezing.No chest wall deformity  Skin:  no rash or lesions. Multiple ecchymotic areas. GU:  no dysuria, change in color of urine, no urgency or frequency. No flank pain. Decreased amount of urine.  Musculoskeletal: Frequent joint pain. No decreased range of motion. No back pain.  Psych:  No change in mood or affect. No depression or anxiety. No memory loss. Insomnia due to persistent cough.  Past Medical History  Diagnosis Date  . CARCINOMA, SKIN, SQUAMOUS CELL 05/26/2008  . GERD 02/22/2007  . HYPERLIPIDEMIA 02/22/2007  . HYPERTENSION 06/17/2007  . MACULAR DEGENERATION 02/22/2007  . OSTEOARTHRITIS, HIP, RIGHT 06/05/2009  . Ovarian cancer Mother   . Emphysema of lung Brother  . Colon cancer Brother #2   Past Surgical History  Procedure Laterality Date  . Laminectomy  1988   Social History:  reports that he has quit smoking. He does not have any smokeless tobacco history on file. He reports that he does not drink alcohol. His drug history is not on file.  Allergies  Allergen Reactions  . Amlodipine Besylate     REACTION: swelling of feet  . Aspirin     REACTION: GI upset can tolerate ecotrin  . Hydrocodone     GI upset    Family History  Problem Relation Age of Onset  . Breast cancer Daughter     Prior to Admission medications   Medication Sig Start Date End Date Taking? Authorizing Provider  aspirin (ECOTRIN) 325 MG EC tablet Take 325 mg by mouth daily.   Yes Historical Provider, MD  benzonatate (TESSALON) 100 MG capsule Take 1 capsule (100 mg total) by mouth  2 (two) times daily as needed for cough. 03/05/15  Yes [name], MD  cetirizine (ZYRTEC) 10 MG tablet TAKE 1 TABLET BY MOUTH EVERY DAY 11/20/14  Yes [name], MD  fluticasone [facility]) 50 MCG/ACT nasal spray Place 2 sprays into both nostrils daily. Patient taking differently: Place 2 sprays into both nostrils daily as needed for allergies.  10/11/14  Yes [name], MD    furosemide (LASIX) 20 MG tablet Take 1 tablet (20 mg total) by mouth daily. 11/08/14  Yes [name], MD  losartan (COZAAR) 100 MG tablet TAKE 1 TABLET BY MOUTH EVERY DAY 02/01/15  Yes [name], MD  lovastatin (MEVACOR) 40 MG tablet TAKE 1 TABLET BY MOUTH EVERY DAY 01/08/15  Yes [name], MD  Multiple Vitamins-Minerals (MULTIVITAMIN WITH MINERALS) tablet Take 1 tablet by mouth daily.   Yes Historical Provider, MD  omeprazole (PRILOSEC) 20 MG capsule Take 1 capsule (20 mg total) by mouth 2 (two) times daily before a meal. 08/11/14  Yes [name], MD  oxybutynin (DITROPAN) 5 MG tablet Take 0.5 tablets (2.5 mg total) by mouth daily. 10/11/14  Yes [name], MD  levofloxacin (LEVAQUIN) 750 MG tablet Take 1 tablet (750 mg total) by mouth daily. Patient not taking: Reported on 03/08/2015 03/02/15   [name], MD   Physical Exam: Filed Vitals:   03/08/15 1430 03/08/15 1530 03/08/15 1850 03/08/15 1937  BP: 101/47 108/70 124/79 128/64  Pulse:  83 91 98  Temp:      TempSrc:      Resp: 19 24 21 22   SpO2:  94% 93% 97%    Wt Readings from Last 3 Encounters:  01/26/15 78.472 kg (173 lb)  11/28/14 78.926 kg (174 lb)  10/11/14 78.019 kg (172 lb)    General:  Appears calm and comfortable Eyes: PERRL, normal lids, irises & conjunctiva ENT: Uses hearing aids bilaterally, lips & tongue very dry Neck: no LAD, masses or thyromegaly Cardiovascular: RRR, no m/r/g. No LE edema. Telemetry: SR, no arrhythmias  Respiratory: Decreased breath sounds with rales on both bases, mild bilateral wheezing. Abdomen: soft, ntnd, no organomegaly was palpated Skin: no rash or induration seen on limited exam.multiple ecchymotic areas particularly on right side extremities (the patient is on Ecotrin 325 mg daily)  Musculoskeletal: grossly normal tone BUE/BLE Psychiatric: grossly normal mood and affect, speech fluent and appropriate Neurologic: grossly non-focal.          Labs on  Admission:  Basic Metabolic Panel:  Recent Labs Lab 03/02/15 1520 03/08/15 1727  NA 138 145  K 4.0 4.1  CL 104 114  CO2 27 23  GLUCOSE 117 89  BUN 29 58  CREATININE 1.54 1.98  CALCIUM 9.3 8.0   Liver Function Tests:  Recent Labs Lab 03/02/15 1520 03/08/15 1727  AST 24 38  ALT 15 37  ALKPHOS 72 62  BILITOT 0.6 0.9  PROT 7.6 6.1  ALBUMIN 3.9 2.7   No results for input(s): LIPASE, AMYLASE in the last 168 hours. No results for input(s): AMMONIA in the last 168 hours. CBC:  Recent Labs Lab 03/02/15 1520 03/08/15 1727  WBC 8.6 9.0  NEUTROABS 7.7 7.2  HGB 12.1 11.0  HCT 36.4 33.6  MCV 94.0 94.6  PLT 156.0 179   Cardiac Enzymes:  Recent Labs Lab 03/08/15 1727  TROPONINI <0.03    BNP (last 3 results)  Recent Labs  03/08/15 1728  BNP 390.6    ProBNP (  last 3 results) No results for input(s): PROBNP in the last 8760 hours.  CBG: No results for input(s): GLUCAP in the last 168 hours.  Radiological Exams on Admission: Dg Chest 2 View  03/08/2015   CLINICAL DATA:  Productive cough and fever for 1 week, shortness of Breath  EXAM: CHEST - 2 VIEW  COMPARISON:  03/02/2015  FINDINGS: Cardiac shadow is stable. Bibasilar changes are again identified stable from the previous exam. No new focal infiltrate is seen. No sizable effusion is noted. Compression deformity is noted at the thoracolumbar junction of uncertain chronicity.  IMPRESSION: Bibasilar atelectatic changes stable from the previous exam.  Compression deformity at the thoracolumbar junction of uncertain chronicity.   Electronically Signed   By: Inez Catalina M.D.   On: 03/08/2015 15:52    EKG: Independently reviewed.  Sinus tachycardia Atrial premature complexes with a ventricular rate of 90 LVH with IVCD, LAD and secondary repol abnrm Borderline prolonged QT interval since last tracing no significant change  Assessment/Plan Principal Problem:   Bronchial pneumonia   Hypoxia,   Atelectasis  bilateral   Positive D dimer   Active Problems:   Prerenal azotemia Hyperlipidemia   Hypertension   GERD (gastroesophageal reflux disease)   Overactive bladder       Plan Admit the patient for IV antibiotic therapy with Rocephin and doxycycline IV.  DuoNeb nebs every 8 hours. A single dose of Solu-Medrol was given in the ER. Continue supplemental oxygen, continue Tessalon Perles and we will add Robitussin-DM if the cough gets to persistent. Repeat x-ray in the morning after the patient is hydrated. We will rule out PE with a VQ scan in the morning and a single dose of the Lovenox was given to the patient while we wait for this procedure. IV fluids 0.45 % were ordered for about 20 hours at a rate of 100 ml/hr, recheck BUN and creatinine in the morning. Continue regular home medications for hyperlipidemia hypertension GERD and overactive bladder.    Code Status: Full code. DVT Prophylaxis: Started on therapeutic Lovenox by the ER. Family Communication:  [place], [place]  home [phone]     cell phone    Zinser,[name] Daughter   [phone]    Disposition Plan: Home with [place] services  Time spent: 60 minutes  Reubin Milan [facility] Pager [phone]

## 2015-03-08 NOTE — Telephone Encounter (Signed)
Confirmed with RN team lead for daughter to call 911.  Spoke with daughter and she verbally agrees to call 911.  I will follow up with home health care and let the daughter know the status.

## 2015-03-08 NOTE — Telephone Encounter (Signed)
Left detailed message on machine per DPR for daughter explaining that an order has been placed and she should call [facility] [phone].

## 2015-03-09 ENCOUNTER — Inpatient Hospital Stay (HOSPITAL_COMMUNITY): Payer: Medicare Other

## 2015-03-09 DIAGNOSIS — R791 Abnormal coagulation profile: Secondary | ICD-10-CM

## 2015-03-09 DIAGNOSIS — I10 Essential (primary) hypertension: Secondary | ICD-10-CM

## 2015-03-09 DIAGNOSIS — K219 Gastro-esophageal reflux disease without esophagitis: Secondary | ICD-10-CM

## 2015-03-09 DIAGNOSIS — J180 Bronchopneumonia, unspecified organism: Principal | ICD-10-CM

## 2015-03-09 LAB — COMPREHENSIVE METABOLIC PANEL
ALK PHOS: 63 U/L (ref 38–126)
ALT: 37 U/L (ref 17–63)
ANION GAP: 9 (ref 5–15)
AST: 27 U/L (ref 15–41)
Albumin: 2.7 g/dL — ABNORMAL LOW (ref 3.5–5.0)
BILIRUBIN TOTAL: 1 mg/dL (ref 0.3–1.2)
BUN: 58 mg/dL — AB (ref 6–20)
CO2: 22 mmol/L (ref 22–32)
Calcium: 8.2 mg/dL — ABNORMAL LOW (ref 8.9–10.3)
Chloride: 112 mmol/L — ABNORMAL HIGH (ref 101–111)
Creatinine, Ser: 1.73 mg/dL — ABNORMAL HIGH (ref 0.61–1.24)
GFR calc Af Amer: 37 mL/min — ABNORMAL LOW (ref >60)
GFR calc non Af Amer: 32 mL/min — ABNORMAL LOW (ref >60)
Glucose, Bld: 140 mg/dL — ABNORMAL HIGH (ref 65–99)
Potassium: 3.9 mmol/L (ref 3.5–5.1)
SODIUM: 143 mmol/L (ref 135–145)
Total Protein: 6.2 g/dL — ABNORMAL LOW (ref 6.5–8.1)

## 2015-03-09 LAB — CBC
HEMATOCRIT: 32.7 % — AB (ref 39.0–52.0)
Hemoglobin: 10.7 g/dL — ABNORMAL LOW (ref 13.0–17.0)
MCH: 31 pg (ref 26.0–34.0)
MCHC: 32.7 g/dL (ref 30.0–36.0)
MCV: 94.8 fL (ref 78.0–100.0)
PLATELETS: 156 10*3/uL (ref 150–400)
RBC: 3.45 MIL/uL — AB (ref 4.22–5.81)
RDW: 14.4 % (ref 11.5–15.5)
WBC: 7.8 10*3/uL (ref 4.0–10.5)

## 2015-03-09 LAB — MAGNESIUM: Magnesium: 2.4 mg/dL (ref 1.7–2.4)

## 2015-03-09 LAB — STREP PNEUMONIAE URINARY ANTIGEN: Strep Pneumo Urinary Antigen: NEGATIVE

## 2015-03-09 LAB — HIV ANTIBODY (ROUTINE TESTING W REFLEX): HIV Screen 4th Generation wRfx: NONREACTIVE

## 2015-03-09 MED ORDER — IPRATROPIUM-ALBUTEROL 0.5-2.5 (3) MG/3ML IN SOLN: 3.0000 mL | RESPIRATORY_TRACT | Status: DC | PRN

## 2015-03-09 MED ORDER — IPRATROPIUM BROMIDE 0.03 % NA SOLN
2.0000 | Freq: Two times a day (BID) | NASAL | Status: DC
  Administered 2015-03-09 – 2015-03-10 (×3): 2 via NASAL
  Filled 2015-03-09: qty 30

## 2015-03-09 MED ORDER — PREDNISONE 50 MG PO TABS
60.0000 mg | ORAL_TABLET | Freq: Every day | ORAL | Status: DC
  Administered 2015-03-10 – 2015-03-11 (×2): 60 mg via ORAL
  Filled 2015-03-09 (×3): qty 1

## 2015-03-09 MED ORDER — TECHNETIUM TO 99M ALBUMIN AGGREGATED
6.2000 | Freq: Once | INTRAVENOUS | Status: AC | PRN
Start: 2015-03-09 — End: 2015-03-09
  Administered 2015-03-09: 6 via INTRAVENOUS

## 2015-03-09 MED ORDER — FLUTICASONE PROPIONATE 50 MCG/ACT NA SUSP
1.0000 | Freq: Every day | NASAL | Status: DC
  Administered 2015-03-09 – 2015-03-12 (×4): 1 via NASAL
  Filled 2015-03-09: qty 16

## 2015-03-09 MED ORDER — DIPHENHYDRAMINE HCL 12.5 MG/5ML PO ELIX
12.5000 mg | ORAL_SOLUTION | Freq: Once | ORAL | Status: AC
  Administered 2015-03-09: 12.5 mg via ORAL
  Filled 2015-03-09: qty 5

## 2015-03-09 MED ORDER — ENOXAPARIN SODIUM 30 MG/0.3ML ~~LOC~~ SOLN
30.0000 mg | SUBCUTANEOUS | Status: DC
  Administered 2015-03-09 – 2015-03-10 (×2): 30 mg via SUBCUTANEOUS
  Filled 2015-03-09 (×3): qty 0.3

## 2015-03-09 MED ORDER — TECHNETIUM TC 99M DIETHYLENETRIAME-PENTAACETIC ACID
40.0000 | Freq: Once | INTRAVENOUS | Status: AC | PRN
Start: 2015-03-09 — End: 2015-03-09

## 2015-03-09 MED ORDER — DIPHENHYDRAMINE HCL 12.5 MG/5ML PO ELIX
12.5000 mg | ORAL_SOLUTION | Freq: Every evening | ORAL | Status: DC | PRN
Start: 2015-03-09 — End: 2015-03-12
  Administered 2015-03-10: 12.5 mg via ORAL
  Filled 2015-03-09: qty 5

## 2015-03-09 MED ORDER — GUAIFENESIN ER 600 MG PO TB12
600.0000 mg | ORAL_TABLET | Freq: Two times a day (BID) | ORAL | Status: DC
  Administered 2015-03-09 – 2015-03-10 (×2): 600 mg via ORAL
  Filled 2015-03-09 (×3): qty 1

## 2015-03-09 NOTE — Progress Notes (Signed)
ANTICOAGULATION CONSULT NOTE - Initial Consult  Pharmacy Consult for Lovenox Indication: VTE prophylaxis  Allergies  Allergen Reactions  . Amlodipine Besylate     REACTION: swelling of feet  . Aspirin     REACTION: GI upset can tolerate ecotrin  . Hydrocodone     GI upset    Patient Measurements:  Height: 5' 8.5" (174 cm) Weight: 167 lb 1.7 oz (75.8 kg) IBW/kg (Calculated) : 69.55 Vital Signs: Temp: 97.8 F (36.6 C) (07/15 1457) Temp Source: Oral (07/15 1457) BP: 130/68 mmHg (07/15 1457) Pulse Rate: 82 (07/15 1457)  Labs:  Recent Labs  03/08/15 1727 03/09/15 0400  HGB 11.0* 10.7*  HCT 33.6* 32.7*  PLT 179 156  CREATININE 1.98* 1.73*  TROPONINI <0.03  --     Estimated Creatinine Clearance: 26.3 mL/min (by C-G formula based on Cr of 1.73).   Medications:  . aspirin EC  325 mg Oral Daily  . cefTRIAXone (ROCEPHIN)  IV  1 g Intravenous Q24H  . doxycycline  100 mg Oral Q12H  . enoxaparin (LOVENOX) injection  80 mg Subcutaneous Q24H  . loratadine  10 mg Oral Daily  . losartan  100 mg Oral Daily  . oxybutynin  2.5 mg Oral Daily  . pantoprazole  40 mg Oral Daily  . pravastatin  40 mg Oral q1800      Assessment: 89 yoM admitted on 7/14 with c/o weakness and cough.  D-dimer is elevated at 11.57.  Chest CT angio pending and pharmacy was initially consulted to start full dose lovenox for r/o VTE.  VQ scan was low probability for PE and pharmacy is consulted to change to prophylactic Lovenox.  Today, 03/09/2015:  Lovenox 1 mg/kg last given 7/14 at ~ 2030  Scr 1.73 is elevated but improved.  CrCl ~ 26 ml/min  CBC: Hgb 10.7, Plt 156  Goal of Therapy:  VTE prophylaxis, Lovenox per renal function Monitor platelets by anticoagulation protocol: Yes   Plan:  - Decrease to Lovenox 30 mg SQ q24h - Pharmacy to sign off note writing, but will f/u peripherally  Gretta Arab PharmD, BCPS Pager [phone] 03/09/2015 4:53 PM

## 2015-03-09 NOTE — Evaluation (Addendum)
Physical Therapy Evaluation Patient Details Name: Roger [name] MRN: [id number] DOB: April 01, 1920 Today's Date: 03/09/2015   History of Present Illness  Roger [name] is a 81 y.o. male with below past medical history who is brought to the emergency department after having persistent cough for about a week.  Dx of bronchial PNA.  Clinical Impression  Pt admitted with above diagnosis. Pt currently with functional limitations due to the deficits listed below (see PT Problem List). Pt ambulated 10' with RW and 3L O2, SaO2 96%. Pt has had 2 falls in the past week and reports decreased overall mobility so would benefit from ST-SNF.  Pt will benefit from skilled PT to increase their independence and safety with mobility to allow discharge to the venue listed below.       Follow Up Recommendations SNF    Equipment Recommendations  None recommended by PT    Recommendations for Other Services       Precautions / Restrictions Precautions Precautions: Fall Precaution Comments: 2 falls in past week Restrictions Weight Bearing Restrictions: No      Mobility  Bed Mobility Overal bed mobility: Needs Assistance Bed Mobility: Supine to Sit     Supine to sit: Mod assist     General bed mobility comments: assist to raise trunk and pivot hips to EOB  Transfers Overall transfer level: Needs assistance Equipment used: Rolling walker (2 wheeled) Transfers: Sit to/from Stand Sit to Stand: Min assist;From elevated surface         General transfer comment: min A to rise  Ambulation/Gait Ambulation/Gait assistance: +2 safety/equipment;Min guard Ambulation Distance (Feet): 10 Feet Assistive device: Rolling walker (2 wheeled) Gait Pattern/deviations: Step-to pattern;Shuffle;Decreased step length - right;Decreased step length - left;Trunk flexed Gait velocity: decr   General Gait Details: distance limited by fatigue, walked with 3L O2 [place], SaO2 96%, +2 for O2 tank and safety due to 2  recent falls  Stairs            Wheelchair Mobility    Modified Rankin (Stroke Patients Only)       Balance Overall balance assessment: Needs assistance   Sitting balance-Leahy Scale: Good     Standing balance support: Bilateral upper extremity supported Standing balance-Leahy Scale: Poor Standing balance comment: requires BUE support                             Pertinent Vitals/Pain Pain Assessment: No/denies pain    Home Living Family/patient expects to be discharged to:: Private residence Living Arrangements: Spouse/significant other;Children (daughter and son in [employer]) Available Help at Discharge: Family Type of Home: House         Home Equipment: Environmental consultant - 4 wheels, stair lift, toilet raiser      Prior Function Level of Independence: Needs assistance   Gait / Transfers Assistance Needed: independent with rollator, 2 falls in 1 week due to RLE giving out and feeling off balance per pt  ADL's / Homemaking Assistance Needed: wife assists with donning TED hose        Hand Dominance        Extremity/Trunk Assessment   Upper Extremity Assessment: Defer to OT evaluation           Lower Extremity Assessment: Overall WFL for tasks assessed      Cervical / Trunk Assessment: Normal  Communication   Communication: No difficulties  Cognition Arousal/Alertness: Awake/alert Behavior During Therapy: WFL for tasks assessed/performed Overall Cognitive Status:  Within Functional Limits for tasks assessed                      General Comments      Exercises        Assessment/Plan    PT Assessment Patient needs continued PT services  PT Diagnosis Difficulty walking;Generalized weakness   PT Problem List Decreased balance;Decreased activity tolerance;Decreased mobility;Cardiopulmonary status limiting activity  PT Treatment Interventions Gait training;Functional mobility training;Therapeutic activities;Patient/family  education;Therapeutic exercise   PT Goals (Current goals can be found in the Care Plan section) Acute Rehab PT Goals Patient Stated Goal: to improve balance, return to PLOF PT Goal Formulation: With patient Time For Goal Achievement: 03/23/15 Potential to Achieve Goals: Good    Frequency Min 3X/week   Barriers to discharge        Co-evaluation PT/OT/SLP Co-Evaluation/Treatment: Yes             End of Session Equipment Utilized During Treatment: Gait belt;Oxygen Activity Tolerance: Patient tolerated treatment well;Patient limited by fatigue Patient left: in chair;with call bell/phone within reach Nurse Communication: Mobility status         Time: 2951-8841 PT Time Calculation (min) (ACUTE ONLY): 27 min   Charges:   PT Evaluation $Initial PT Evaluation Tier I: 1 Procedure PT Treatments $Gait Training: 8-22 mins   PT G Codes:        Philomena Doheny 03/09/2015, 12:42 PM [phone]

## 2015-03-09 NOTE — Evaluation (Signed)
Occupational Therapy Evaluation Patient Details Name: Roger [name] MRN: [id number] DOB: 02/22/1920 Today's Date: 03/09/2015    History of Present Illness Roger [name] is a 81 y.o. male with below past medical history who is brought to the emergency department after having persistent cough for about a week.  Dx of bronchial PNA.   Clinical Impression   Pt assisted OOB to use walker for functional mobility to the doorway today. Will benefit from continued OT to progress safety and independence with self care. Recommend SNF at d/c as pt reports several recent falls and decreased balance. Will follow.    Follow Up Recommendations  SNF;Supervision/Assistance - 24 hour    Equipment Recommendations  Other (comment) (defer to next venue)    Recommendations for Other Services       Precautions / Restrictions Precautions Precautions: Fall Precaution Comments: 2 falls in past week Restrictions Weight Bearing Restrictions: No      Mobility Bed Mobility Overal bed mobility: Needs Assistance Bed Mobility: Supine to Sit     Supine to sit: Mod assist     General bed mobility comments: assist to raise trunk and pivot hips to EOB  Transfers Overall transfer level: Needs assistance Equipment used: Rolling walker (2 wheeled) Transfers: Sit to/from Stand Sit to Stand: Min assist;From elevated surface         General transfer comment: min A to rise    Balance Overall balance assessment: Needs assistance   Sitting balance-Leahy Scale: Good     Standing balance support: Bilateral upper extremity supported Standing balance-Leahy Scale: Poor Standing balance comment: requires BUE support                            ADL Overall ADL's : Needs assistance/impaired Eating/Feeding: Independent;Sitting   Grooming: Wash/dry hands;Set up;Sitting   Upper Body Bathing: Minimal assitance;Sitting   Lower Body Bathing: Moderate assistance;Sit to/from stand   Upper  Body Dressing : Minimal assistance;Sitting   Lower Body Dressing: Maximal assistance;Sit to/from stand   Toilet Transfer: Minimal assistance;Ambulation;RW +2 for safety.    Toileting- Clothing Manipulation and Hygiene: Maximal assistance;Sit to/from stand         General ADL Comments: Pt's O2 sats on 3L after activity 96%. Encouraged to sit up for a bit to increase strength and also discussed SNF recommendation with pt. Pt tending to use walker to pull up to standing and once standing, requires external support in standing. Difficult to let go of walker currently to manage clothing,bathe, etc as he needs the walker for support. He states even at home, he leaned up against the counter for support to brush his teeth due to decreased balance.      Vision     Perception     Praxis      Pertinent Vitals/Pain Pain Assessment: No/denies pain     Hand Dominance Right   Extremity/Trunk Assessment Upper Extremity Assessment Upper Extremity Assessment: RUE deficits/detail;LUE deficits/detail RUE Deficits / Details: AROm shoulder flexion to approximately 60 degreees. AAROM WNL; elbow distal grossly 4/5. LUE Deficits / Details: as above. pt states history of bilateral rotator cuff issues. has had therapy for shoulders in the past per pt report.       Cervical / Trunk Assessment Cervical / Trunk Assessment: Normal   Communication Communication Communication: No difficulties   Cognition Arousal/Alertness: Awake/alert Behavior During Therapy: WFL for tasks assessed/performed Overall Cognitive Status: Within Functional Limits for tasks assessed  General Comments       Exercises       Shoulder Instructions      Home Living Family/patient expects to be discharged to:: Skilled nursing facility Living Arrangements: Spouse/significant other;Children (daughter and son in law) Available Help at Discharge: Family Type of Home: House Home Access: Other  (comment) (chair lift)           Bathroom Shower/Tub: Walk-in shower         Home Equipment: Walker - 4 wheels;Shower seat;Adaptive equipment Adaptive Equipment: Reacher;Sock aid        Prior Functioning/Environment Level of Independence: Needs assistance  Gait / Transfers Assistance Needed: independent with rollator, 2 falls in 1 week due to RLE giving out and feeling off balance per pt ADL's / Homemaking Assistance Needed: wife assists with donning TED hose        OT Diagnosis: Generalized weakness   OT Problem List: Decreased strength;Decreased knowledge of use of DME or AE   OT Treatment/Interventions: Self-care/ADL training;Patient/family education;Therapeutic activities;DME and/or AE instruction    OT Goals(Current goals can be found in the care plan section) Acute Rehab OT Goals Patient Stated Goal: to return to more independence.  OT Goal Formulation: With patient Time For Goal Achievement: 03/23/15 Potential to Achieve Goals: Good  OT Frequency: Min 2X/week   Barriers to D/C:            Co-evaluation              End of Session Equipment Utilized During Treatment: Gait belt;Rolling walker  Activity Tolerance: Patient tolerated treatment well Patient left: in chair;with call bell/phone within reach   Time: 1116-1150 OT Time Calculation (min): 34 min Charges:  OT General Charges $OT Visit: 1 Procedure OT Evaluation $Initial OT Evaluation Tier I: 1 Procedure G-Codes:    Jules Schick  [phone] 03/09/2015, 1:13 PM

## 2015-03-09 NOTE — Progress Notes (Signed)
Pt  Alert and oriented x4. Appetite poor for dinner. Family in to visit. Pt using urinal. O2 @ 3L via  intact.

## 2015-03-09 NOTE — Progress Notes (Signed)
Patient admitted to room 1331 from ED with bronchitis. Oriented to room and unit.

## 2015-03-09 NOTE — Progress Notes (Signed)
There was admission orders for checking a Mg level and to give 2 g Mg sulfate. Waited til Mg level resulted before hanging IVPB. But Mg+ level 2.4 so paged NP Baltazar Najjar and order for IVPB discontinued. Will reck level in am.

## 2015-03-09 NOTE — Progress Notes (Signed)
[facility] PROGRESS NOTE  Roger [name] OZH:[id number] DOB: [birth year] DOA: 03/08/2015 PCP: Garret Reddish, MD  Brief Summary  The patient is a 81 year old male with history of a cough for approximately 1 week prior to admission. He was started on levofloxacin by his primary care doctor but had progressive dyspnea and presented to the emergency department with oxygen levels in the 80s on room air into the mid 90s on 4 L nasal cannula.  Assessment/Plan  Acute hypoxic respiratory failure secondary to possible community-acquired pneumonia  -  Discontinue ceftriaxone and continue doxycycline  -  Repeat chest x-ray demonstrates no evidence of infiltrate -  VQ scan was low probability for pulmonary embolism -   continue to try to transition back to room air  -  Start scheduled guaifenesin -  Trial of prednisone  Recurrent falls, PT/OT recommending skilled nursing facility   Hypertension/hyperlipidemia -  Stable, continue statin and losartan  CKD stage 3, creatinine stable near 1.7  Chronic normocytic anemia, hemoglobin trending down slightly with hydration -  Repeat hgb in AM  Chronic sinus congestion -  Start Flonase, ipratropium nasal spray  Diet:  regular Access:  PIV IVF:  off Proph:  lovenox  Code Status: full Family Communication: patient alone Disposition Plan: pending improvement in hypoxia   Consultants: None  procedures: Chest x-ray VQ scan: Low risk for pulmonary embolism  Antibiotics:  Ceftriaxone 7/14 >   Doxycycline 7/14 >   HPI/Subjective:  States that he has continuous nasal drip which pools in the back of his throat and in his lungs and makes him have very thick mucus that is difficult to cough up. He states that he is feeling a little better.  Objective: Filed Vitals:   03/09/15 0913 03/09/15 1152 03/09/15 1211 03/09/15 1457  BP:   143/79 130/68  Pulse:    82  Temp:    97.8 F (36.6 C)  TempSrc:    Oral  Resp:    18  Height:       Weight:      SpO2: 95% 96%  95%    Intake/Output Summary (Last 24 hours) at 03/09/15 1642 Last data filed at 03/09/15 1458  Gross per 24 hour  Intake 1998.33 ml  Output    625 ml  Net 1373.33 ml   Filed Weights   03/08/15 2341  Weight: 75.8 kg (167 lb 1.7 oz)   Body mass index is 25.04 kg/(m^2).  Exam:   General:  Adult male: No acute distress frequent rhonchorous cough   HEENT:  NCAT, MMM, left naris and was completely occluded by mucous and is very narrow, right nares is patent   Cardiovascular:  RRR, nl S1, S2 no mrg, 2+ pulses, warm extremities  Respiratory:  Very diminished bilateral breath sounds with full expiratory wheeze, rhonchi, no focal rales, no increased WOB  Abdomen:   NABS, soft, NT/ND  MSK:   Normal tone and bulk, 1+ bilateral pitting LEE  Neuro:  Grossly intact  Data Reviewed: Basic Metabolic Panel:  Recent Labs Lab 03/08/15 1727 03/08/15 2337 03/09/15 0400  NA 145  --  143  K 4.1  --  3.9  CL 114*  --  112*  CO2 23  --  22  GLUCOSE 89  --  140*  BUN 58*  --  58*  CREATININE 1.98*  --  1.73*  CALCIUM 8.0*  --  8.2*  MG  --  2.4  --    Liver Function Tests:  Recent  Labs Lab 03/08/15 1727 03/09/15 0400  AST 38 27  ALT 37 37  ALKPHOS 62 63  BILITOT 0.9 1.0  PROT 6.1* 6.2*  ALBUMIN 2.7* 2.7*   No results for input(s): LIPASE, AMYLASE in the last 168 hours. No results for input(s): AMMONIA in the last 168 hours. CBC:  Recent Labs Lab 03/08/15 1727 03/09/15 0400  WBC 9.0 7.8  NEUTROABS 7.2  --   HGB 11.0* 10.7*  HCT 33.6* 32.7*  MCV 94.6 94.8  PLT 179 156    No results found for this or any previous visit (from the past 240 hour(s)).   Studies: Dg Chest 2 View  03/09/2015   CLINICAL DATA:  Patient with cough and shortness breath.  EXAM: CHEST  2 VIEW  COMPARISON:  Chest radiograph 03/08/2015  FINDINGS: Low lung volumes. Stable cardiac and mediastinal contours with volume loss and calcification of the thoracic aorta.  Bilateral lower lung heterogeneous opacities. Small bilateral pleural effusions. Mid thoracic spine degenerative changes. Age-indeterminate compression deformity of the thoracolumbar junction.  IMPRESSION: Low lung volumes with bibasilar atelectasis and or scarring.  Compression deformity of the thoracolumbar junction of uncertain chronicity.   Electronically Signed   By: Lovey Newcomer M.D.   On: 03/09/2015 09:54   Dg Chest 2 View  03/08/2015   CLINICAL DATA:  Productive cough and fever for 1 week, shortness of Breath  EXAM: CHEST - 2 VIEW  COMPARISON:  03/02/2015  FINDINGS: Cardiac shadow is stable. Bibasilar changes are again identified stable from the previous exam. No new focal infiltrate is seen. No sizable effusion is noted. Compression deformity is noted at the thoracolumbar junction of uncertain chronicity.  IMPRESSION: Bibasilar atelectatic changes stable from the previous exam.  Compression deformity at the thoracolumbar junction of uncertain chronicity.   Electronically Signed   By: Inez Catalina M.D.   On: 03/08/2015 15:52   Nm Pulmonary Perf And Vent  03/09/2015   CLINICAL DATA:  Shortness of breath and fever  EXAM: NUCLEAR MEDICINE VENTILATION - PERFUSION LUNG SCAN  Views: Anterior, posterior, left lateral, right lateral, RPO, LPO, RAO, LAO -ventilation and perfusion  Radionuclide: Technetium 55m DTPA -ventilation ; Technetium 27m macroaggregated albumin-perfusion  Dose:  40.0 mCi-ventilation; 6.2 mCi-perfusion  Route of administration: Inhalation -ventilation ; intravenous -perfusion  COMPARISON:  Chest radiograph March 09, 2015  FINDINGS: Ventilation: Radiotracer uptake on the ventilation study is essentially homogeneous and symmetric bilaterally.  Perfusion: Radiotracer uptake on the perfusion study is homogeneous and symmetric bilaterally.  IMPRESSION: No appreciable ventilation or perfusion defects. Very low probability of pulmonary embolus.   Electronically Signed   By: Lowella Grip III  M.D.   On: 03/09/2015 10:55    Scheduled Meds: . aspirin EC  325 mg Oral Daily  . cefTRIAXone (ROCEPHIN)  IV  1 g Intravenous Q24H  . doxycycline  100 mg Oral Q12H  . enoxaparin (LOVENOX) injection  80 mg Subcutaneous Q24H  . loratadine  10 mg Oral Daily  . losartan  100 mg Oral Daily  . oxybutynin  2.5 mg Oral Daily  . pantoprazole  40 mg Oral Daily  . pravastatin  40 mg Oral q1800   Continuous Infusions:   Principal Problem:   Bronchial pneumonia Active Problems:   Hyperlipidemia   Hypertension   GERD (gastroesophageal reflux disease)   Overactive bladder   Hypoxia   Atelectasis, bilateral   Positive D dimer    Time spent: 30 min    Roger [name]  [facility] Pager  [phone]. If 7PM-7AM, please contact night-coverage at [email or web address], password [email or web address] 03/09/2015, 4:42 PM  LOS: 1 day

## 2015-03-10 DIAGNOSIS — Z8701 Personal history of pneumonia (recurrent): Secondary | ICD-10-CM

## 2015-03-10 LAB — EXPECTORATED SPUTUM ASSESSMENT W GRAM STAIN, RFLX TO RESP C

## 2015-03-10 MED ORDER — ALBUTEROL SULFATE (2.5 MG/3ML) 0.083% IN NEBU
2.5000 mg | INHALATION_SOLUTION | Freq: Three times a day (TID) | RESPIRATORY_TRACT | Status: DC
  Administered 2015-03-10 – 2015-03-12 (×6): 2.5 mg via RESPIRATORY_TRACT
  Filled 2015-03-10 (×7): qty 3

## 2015-03-10 MED ORDER — MORPHINE SULFATE 15 MG PO TABS
7.5000 mg | ORAL_TABLET | ORAL | Status: DC | PRN
  Administered 2015-03-10 – 2015-03-11 (×3): 7.5 mg via ORAL
  Filled 2015-03-10 (×3): qty 1

## 2015-03-10 MED ORDER — POLYETHYLENE GLYCOL 3350 17 G PO PACK
17.0000 g | PACK | Freq: Every day | ORAL | Status: DC
  Administered 2015-03-10 – 2015-03-12 (×3): 17 g via ORAL
  Filled 2015-03-10 (×3): qty 1

## 2015-03-10 MED ORDER — BENZONATATE 100 MG PO CAPS
100.0000 mg | ORAL_CAPSULE | Freq: Three times a day (TID) | ORAL | Status: DC
  Administered 2015-03-10 – 2015-03-12 (×6): 100 mg via ORAL
  Filled 2015-03-10 (×8): qty 1

## 2015-03-10 MED ORDER — SENNOSIDES-DOCUSATE SODIUM 8.6-50 MG PO TABS
1.0000 | ORAL_TABLET | Freq: Two times a day (BID) | ORAL | Status: DC
  Administered 2015-03-10 – 2015-03-12 (×5): 1 via ORAL
  Filled 2015-03-10 (×5): qty 1

## 2015-03-10 MED ORDER — GUAIFENESIN ER 600 MG PO TB12
1200.0000 mg | ORAL_TABLET | Freq: Two times a day (BID) | ORAL | Status: DC
  Administered 2015-03-10 – 2015-03-12 (×4): 1200 mg via ORAL
  Filled 2015-03-10 (×5): qty 2

## 2015-03-10 NOTE — Plan of Care (Addendum)
Problem: Phase II Progression Outcomes Goal: Tolerating diet Outcome: Progressing Patient with taking pills tonight has gotten "strangled/choked" while swallowing them with water. Patient thinks it is because of the "mucus in his throat".  Patient reports that even at home with eating he gets "choked sometimes"

## 2015-03-10 NOTE — Evaluation (Signed)
Clinical/Bedside Swallow Evaluation Patient Details  Name: Roger [name] MRN: [id number] Date of Birth: [birth year]  Today's Date: 03/10/2015 Time: SLP Start Time (ACUTE ONLY): 1400 SLP Stop Time (ACUTE ONLY): 1421 SLP Time Calculation (min) (ACUTE ONLY): 21 min  Past Medical History:  Past Medical History  Diagnosis Date  . CARCINOMA, SKIN, SQUAMOUS CELL 05/26/2008  . GERD 02/22/2007  . HYPERLIPIDEMIA 02/22/2007  . HYPERTENSION 06/17/2007  . MACULAR DEGENERATION 02/22/2007  . OSTEOARTHRITIS, HIP, RIGHT 06/05/2009  . Ovarian cancer Mother   . Emphysema of lung Brother  . Colon cancer Brother #2   Past Surgical History:  Past Surgical History  Procedure Laterality Date  . Laminectomy  1988   HPI:  81 y.o. male with below past medical history who is brought to the emergency department on 03/09/15 after having persistent cough for about a week. The patient went to see his PCP on Friday and was prescribed Levaquin orally, he seemed to be doing better by Monday, but then had a fever couple days ago, increased dyspnea, cough frequency and expectoration of brownish, occasionally clear sputum. He also feels a postnasal drip sometimes   Assessment / Plan / Recommendation Clinical Impression   Pt with multiple swallows with thin liquids via larger volumes (straw sips), but eliminated after small sips; pt stated he "has to be careful" when eating/drinking at home, but with smaller sips/bites, he is able to eat/drink without difficulty.  MBS (3/15) indicated slight epiglottic deflection, but was essentially normal without aspiration/penetration noted with any consistency.  CXR on 03/09/15 indicated Low lung volumes with bibasilar atelectasis and or scarring. Compression deformity of the thoracolumbar junction of uncertain chronicity. ST to f/u with diet tolerance and complete objective testing prn or pt may obtain as an outpatient prn if dysphagia persists.    Aspiration Risk  Mild    Diet  Recommendation Thin;Other (Comment) (Regular consistency)   Medication Administration: Other (Comment) (whole with puree or as tolerated with small sips) Compensations: Slow rate;Small sips/bites;Other (Comment) (no straws)    Other  Recommendations Oral Care Recommendations: Oral care BID   Follow Up Recommendations    OP MBS prn if dysphagia persists   Frequency and Duration min 1 x/week  1 week   Pertinent Vitals/Pain WDL    SLP Swallow Goals  See POC   Swallow Study Prior Functional Status   Modified independent at home    General Date of Onset: 03/08/15 Other Pertinent Information: 81 y.o. male with below past medical history who is brought to the emergency department after having persistent cough for about a week. The patient went to see his PCP on Friday and was prescribed Levaquin orally, he seemed to be doing better by Monday, but then had a fever couple days ago, increased dyspnea, cough frequency and expectoration of brownish, occasionally clear sputum. He also feels a postnasal drip sometimes Type of Study: Bedside swallow evaluation Previous Swallow Assessment: MBS 3/15 WFL Diet Prior to this Study: Regular;Thin liquids Temperature Spikes Noted: No Respiratory Status: Supplemental O2 delivered via (comment) ([place]) History of Recent Intubation: No Behavior/Cognition: Alert;Cooperative;Pleasant mood Oral Cavity - Dentition: Adequate natural dentition/normal for age Self-Feeding Abilities: Able to feed self;Needs assist Patient Positioning: Upright in bed Baseline Vocal Quality: Breathy;Low vocal intensity Volitional Cough: Strong Volitional Swallow: Able to elicit    Oral/Motor/Sensory Function Overall Oral Motor/Sensory Function: Appears within functional limits for tasks assessed   Ice Chips Ice chips: Not tested   Thin Liquid Thin Liquid: Impaired Presentation: Cup;Straw Pharyngeal  Phase Impairments: Multiple swallows    Nectar Thick Nectar Thick Liquid: Not  tested   Honey Thick Honey Thick Liquid: Not tested   Puree Puree: Within functional limits Presentation: Spoon   Solid       Solid: Within functional limits Presentation: Self Fed       Moishy Laday,PAT, M.S., CCC-SLP 03/10/2015,2:32 PM

## 2015-03-10 NOTE — Progress Notes (Signed)
[facility] PROGRESS NOTE  NECO KLING UDJ:[id number] DOB: 01/29/1920 DOA: 03/08/2015 PCP: Garret Reddish, MD  Brief Summary  The patient is a 81 year old male with history of a cough for approximately 1 week prior to admission. He was started on levofloxacin by his primary care doctor but had progressive dyspnea and presented to the emergency department with oxygen levels in the 80s on room air into the mid 90s on 4 L nasal cannula.  Assessment/Plan  Acute hypoxic respiratory failure secondary to possible community-acquired pneumonia  -  Continue doxycycline  -  Repeat chest x-ray demonstrates no evidence of infiltrate -  VQ scan was low probability for pulmonary embolism -   continue to try to transition back to room air  -  Increase guaifenesin -  Try morphine for cough suppression -  Start TID albuterol -  Trial of prednisone  Recurrent falls, PT/OT recommending skilled nursing facility  -  SW consult placed  Hypertension/hyperlipidemia -  Stable, continue statin and losartan  CKD stage 3, creatinine stable near 1.7  Chronic normocytic anemia, hemoglobin trended down slightly with hydration  Chronic sinus congestion -  Start Flonase, ipratropium nasal spray  Diet:  regular Access:  PIV IVF:  off Proph:  lovenox  Code Status: full Family Communication: patient alone Disposition Plan:  To SNF likely Monday.   Consultants: None  procedures: Chest x-ray VQ scan: Low risk for pulmonary embolism  Antibiotics:  Ceftriaxone 7/14 >   Doxycycline 7/14 >   HPI/Subjective:  States that he was up half the night coughing and could not rest and therefore he is very tired today. He continues to have very thick sputum.  Objective: Filed Vitals:   03/09/15 1457 03/09/15 2248 03/09/15 2333 03/10/15 0500  BP: 130/68  114/72 115/65  Pulse: 82 94 85 73  Temp: 97.8 F (36.6 C)  98 F (36.7 C) 97.8 F (36.6 C)  TempSrc: Oral  Oral Oral  Resp: 18  17 18    Height:      Weight:      SpO2: 95%  96% 93%    Intake/Output Summary (Last 24 hours) at 03/10/15 1443 Last data filed at 03/10/15 1309  Gross per 24 hour  Intake   1382 ml  Output    865 ml  Net    517 ml   Filed Weights   03/08/15 2341  Weight: 75.8 kg (167 lb 1.7 oz)   Body mass index is 25.04 kg/(m^2).  Exam:   General:  Adult male: No acute distress, frequent rhonchorous cough, on nasal canula  HEENT:  NCAT, MMM   Cardiovascular:  RRR, nl S1, S2 no mrg, 2+ pulses, warm extremities  Respiratory:  Very diminished bilateral breath sounds no wheeze, rhonchi, or focal rales  Abdomen:   NABS, soft, NT/ND  MSK:   Normal tone and bulk, 1+ bilateral pitting LEE   Neuro:  Grossly intact  Data Reviewed: Basic Metabolic Panel:  Recent Labs Lab 03/08/15 1727 03/08/15 2337 03/09/15 0400  NA 145  --  143  K 4.1  --  3.9  CL 114*  --  112*  CO2 23  --  22  GLUCOSE 89  --  140*  BUN 58*  --  58*  CREATININE 1.98*  --  1.73*  CALCIUM 8.0*  --  8.2*  MG  --  2.4  --    Liver Function Tests:  Recent Labs Lab 03/08/15 1727 03/09/15 0400  AST 38 27  ALT 37  37  ALKPHOS 62 63  BILITOT 0.9 1.0  PROT 6.1* 6.2*  ALBUMIN 2.7* 2.7*   No results for input(s): LIPASE, AMYLASE in the last 168 hours. No results for input(s): AMMONIA in the last 168 hours. CBC:  Recent Labs Lab 03/08/15 1727 03/09/15 0400  WBC 9.0 7.8  NEUTROABS 7.2  --   HGB 11.0* 10.7*  HCT 33.6* 32.7*  MCV 94.6 94.8  PLT 179 156    Recent Results (from the past 240 hour(s))  Culture, blood (routine x 2) Call MD if unable to obtain prior to antibiotics being given     Status: None (Preliminary result)   Collection Time: 03/08/15 11:25 PM  Result Value Ref Range Status   Specimen Description BLOOD R HAND  Final   Special Requests BOTTLES DRAWN AEROBIC AND ANAEROBIC 10CC  Final   Culture   Final    NO GROWTH 1 DAY Performed at [facility]    Report Status PENDING  Incomplete   Culture, blood (routine x 2) Call MD if unable to obtain prior to antibiotics being given     Status: None (Preliminary result)   Collection Time: 03/08/15 11:37 PM  Result Value Ref Range Status   Specimen Description BLOOD LEFT ANTECUBITAL  Final   Special Requests BOTTLES DRAWN AEROBIC AND ANAEROBIC 10ML  Final   Culture   Final    NO GROWTH 1 DAY Performed at [facility]    Report Status PENDING  Incomplete  Culture, sputum-assessment     Status: None   Collection Time: 03/10/15  9:12 AM  Result Value Ref Range Status   Specimen Description SPUTUM  Final   Special Requests NONE  Final   Sputum evaluation   Final    THIS SPECIMEN IS ACCEPTABLE. RESPIRATORY CULTURE REPORT TO FOLLOW.   Report Status 03/10/2015 FINAL  Final     Studies: Dg Chest 2 View  03/09/2015   CLINICAL DATA:  Patient with cough and shortness breath.  EXAM: CHEST  2 VIEW  COMPARISON:  Chest radiograph 03/08/2015  FINDINGS: Low lung volumes. Stable cardiac and mediastinal contours with volume loss and calcification of the thoracic aorta. Bilateral lower lung heterogeneous opacities. Small bilateral pleural effusions. Mid thoracic spine degenerative changes. Age-indeterminate compression deformity of the thoracolumbar junction.  IMPRESSION: Low lung volumes with bibasilar atelectasis and or scarring.  Compression deformity of the thoracolumbar junction of uncertain chronicity.   Electronically Signed   By: Lovey Newcomer M.D.   On: 03/09/2015 09:54   Dg Chest 2 View  03/08/2015   CLINICAL DATA:  Productive cough and fever for 1 week, shortness of Breath  EXAM: CHEST - 2 VIEW  COMPARISON:  03/02/2015  FINDINGS: Cardiac shadow is stable. Bibasilar changes are again identified stable from the previous exam. No new focal infiltrate is seen. No sizable effusion is noted. Compression deformity is noted at the thoracolumbar junction of uncertain chronicity.  IMPRESSION: Bibasilar atelectatic changes stable from the  previous exam.  Compression deformity at the thoracolumbar junction of uncertain chronicity.   Electronically Signed   By: Inez Catalina M.D.   On: 03/08/2015 15:52   Nm Pulmonary Perf And Vent  03/09/2015   CLINICAL DATA:  Shortness of breath and fever  EXAM: NUCLEAR MEDICINE VENTILATION - PERFUSION LUNG SCAN  Views: Anterior, posterior, left lateral, right lateral, RPO, LPO, RAO, LAO -ventilation and perfusion  Radionuclide: Technetium 28m DTPA -ventilation ; Technetium 34m macroaggregated albumin-perfusion  Dose:  40.0 mCi-ventilation; 6.2 mCi-perfusion  Route of administration: Inhalation -ventilation ; intravenous -perfusion  COMPARISON:  Chest radiograph March 09, 2015  FINDINGS: Ventilation: Radiotracer uptake on the ventilation study is essentially homogeneous and symmetric bilaterally.  Perfusion: Radiotracer uptake on the perfusion study is homogeneous and symmetric bilaterally.  IMPRESSION: No appreciable ventilation or perfusion defects. Very low probability of pulmonary embolus.   Electronically Signed   By: Lowella Grip III M.D.   On: 03/09/2015 10:55    Scheduled Meds: . aspirin EC  325 mg Oral Daily  . doxycycline  100 mg Oral Q12H  . enoxaparin (LOVENOX) injection  30 mg Subcutaneous Q24H  . fluticasone  1 spray Each Nare Daily  . guaiFENesin  600 mg Oral BID  . ipratropium  2 spray Each Nare BID  . loratadine  10 mg Oral Daily  . losartan  100 mg Oral Daily  . oxybutynin  2.5 mg Oral Daily  . pantoprazole  40 mg Oral Daily  . pravastatin  40 mg Oral q1800  . predniSONE  60 mg Oral Q breakfast   Continuous Infusions:   Principal Problem:   Bronchial pneumonia Active Problems:   Hyperlipidemia   Hypertension   GERD (gastroesophageal reflux disease)   Overactive bladder   Hypoxia   Atelectasis, bilateral   Positive D dimer    Time spent: 30 min    Janye Maynor, [facility] Pager [phone]. If 7PM-7AM, please contact night-coverage at [email or web address],  password [facility] 03/10/2015, 2:43 PM  LOS: 2 days

## 2015-03-10 NOTE — Clinical Social Work Note (Signed)
Clinical Social Work Assessment  Patient Details  Name: Roger [name] MRN: [id number] Date of Birth: Dec 14, 1919  Date of referral:  03/10/15               Reason for consult:  Facility Placement                Permission sought to share information with:   (no permission granted) Permission granted to share information::  No  Name::        Agency::     Relationship::     Contact Information:     Housing/Transportation Living arrangements for the past 2 months:  Single Family Home Source of Information:  Patient Patient Interpreter Needed:  None Criminal Activity/Legal Involvement Pertinent to Current Situation/Hospitalization:    Significant Relationships:  Adult Children Lives with:  Spouse Do you feel safe going back to the place where you live?  Yes Need for family participation in patient care:  No (Coment)  Care giving concerns:  No caregiver   Facilities manager / plan:  CSW met with pt at his bedside to discuss discharge needs.   CSW provided information about role and prompted pt to discuss history, needs, relationships and his  home life.  CSW encouraged pt to explore thoughts and feelings related to rehab at discharge.  CSW provided active and supportive listening.  CSW will refer to case manager for in home services as pt refused to go to SNF at discharge   Employment status:  Retired Forensic scientist:  Managed Care PT Recommendations:  [place] / Referral to community resources:     Patient/Family's Response to care:  Pt discussed living with his wife and being married for 34 years.  Pt stated that he was "exhausted" and he just wanted to "go home, shower, shave and sit with my wife".  Pt also discussed his daughter and granddaughter who came in from [place] since he has been hospitilized and that he is missing visiting with them.  Pt stated that he has not been able to sleep for the past 3 days and he "wants to get home to my  wife".  Pt stated that he can't sleep at a SNF knowing that his wife is home alone in her bed.  Pt stated that his home has no steps and he currently has some help that comes into the home and he will get more if needed so that he can return home at discharge.  Patient/Family's Understanding of and Emotional Response to Diagnosis, Current Treatment, and Prognosis:  Pt discussed having a bacterial infection and that many tests were performed to rule other things out.  Pt stated that he understood that at his age the hospital has to look at a lot of things and do a lot of tests but he is tired and wants to go home since he believes the hospital has not found anything that is "life threatening".  Pt stated that all tests are coming back "ok".    Emotional Assessment Appearance:  Appears younger than stated age Attitude/Demeanor/Rapport:   (cooperative ) Affect (typically observed):  Calm Orientation:  Oriented to Self, Oriented to Place, Oriented to  Time, Oriented to Situation Alcohol / Substance use:    Psych involvement (Current and /or in the community):  No (Comment)  Discharge Needs  Concerns to be addressed:  No discharge needs identified Readmission within the last 30 days:    Current discharge risk:    Barriers to  Discharge:  No Barriers Identified   Carlean Jews, LCSW 03/10/2015, 3:42 PM

## 2015-03-10 NOTE — Care Management Note (Signed)
Case Management Note  Patient Details  Name: SAMANTHA OLIVERA MRN: [id number] Date of Birth: [birth year]  Subjective/Objective:                    Action/Plan:  Discharge planning  Expected Discharge Date:   (unknown)               Expected Discharge Plan:  Skilled Nursing Facility  In-House Referral:  Clinical Social Work  Discharge planning Services  CM Consult  Post Acute Care Choice:    Choice offered to:  Patient  DME Arranged:    DME Agency:     HH Arranged:    [place] Agency:     Status of Service:  Completed, signed off  Medicare Important Message Given:    Date Medicare IM Given:    Medicare IM give by:    Date Additional Medicare IM Given:    Additional Medicare Important Message give by:     If discussed at [place] of Stay Meetings, dates discussed:    Additional Comments:  CM received a referral from [place]. Rollene Fare, CSW reports the patient does not want to go to a SNF at discharge and is declining to provide her with information for SNF placement. He would like to return home with home health care. CM spoke with patient at the bedside and provided a list of home health agencies for review. CM spoke with patient's daughter,[name]. She and her mother are unable to provide his care at this time and would like for him to go to a SNF until he is back to his baseline and able to come home safely. Will notify CSW of the family's request.  Apolonio Schneiders, RN 03/10/2015, 4:50 PM

## 2015-03-11 DIAGNOSIS — N183 Chronic kidney disease, stage 3 (moderate): Secondary | ICD-10-CM

## 2015-03-11 DIAGNOSIS — R0902 Hypoxemia: Secondary | ICD-10-CM

## 2015-03-11 DIAGNOSIS — J9601 Acute respiratory failure with hypoxia: Secondary | ICD-10-CM

## 2015-03-11 LAB — BASIC METABOLIC PANEL
ANION GAP: 8 (ref 5–15)
BUN: 50 mg/dL — AB (ref 6–20)
CO2: 21 mmol/L — ABNORMAL LOW (ref 22–32)
Calcium: 8.1 mg/dL — ABNORMAL LOW (ref 8.9–10.3)
Chloride: 114 mmol/L — ABNORMAL HIGH (ref 101–111)
Creatinine, Ser: 1.4 mg/dL — ABNORMAL HIGH (ref 0.61–1.24)
GFR calc Af Amer: 48 mL/min — ABNORMAL LOW (ref >60)
GFR calc non Af Amer: 42 mL/min — ABNORMAL LOW (ref >60)
Glucose, Bld: 121 mg/dL — ABNORMAL HIGH (ref 65–99)
POTASSIUM: 4.2 mmol/L (ref 3.5–5.1)
SODIUM: 143 mmol/L (ref 135–145)

## 2015-03-11 LAB — CBC
HEMATOCRIT: 32 % — AB (ref 39.0–52.0)
HEMOGLOBIN: 10.3 g/dL — AB (ref 13.0–17.0)
MCH: 31 pg (ref 26.0–34.0)
MCHC: 32.2 g/dL (ref 30.0–36.0)
MCV: 96.4 fL (ref 78.0–100.0)
Platelets: 206 10*3/uL (ref 150–400)
RBC: 3.32 MIL/uL — ABNORMAL LOW (ref 4.22–5.81)
RDW: 14.4 % (ref 11.5–15.5)
WBC: 13 10*3/uL — AB (ref 4.0–10.5)

## 2015-03-11 MED ORDER — MORPHINE SULFATE 15 MG PO TABS: 7.5000 mg | ORAL_TABLET | ORAL | Status: DC | PRN

## 2015-03-11 MED ORDER — POLYETHYLENE GLYCOL 3350 17 GM/SCOOP PO POWD: 17.0000 g | Freq: Every day | ORAL | Status: DC

## 2015-03-11 MED ORDER — PREDNISONE 20 MG PO TABS: 40.0000 mg | ORAL_TABLET | Freq: Every day | ORAL | Status: DC

## 2015-03-11 MED ORDER — PREDNISONE 20 MG PO TABS
40.0000 mg | ORAL_TABLET | Freq: Every day | ORAL | Status: DC
  Administered 2015-03-12: 40 mg via ORAL
  Filled 2015-03-11 (×2): qty 2

## 2015-03-11 MED ORDER — DOXYCYCLINE HYCLATE 100 MG PO TABS: 100.0000 mg | ORAL_TABLET | Freq: Two times a day (BID) | ORAL | Status: DC

## 2015-03-11 MED ORDER — FUROSEMIDE 20 MG PO TABS
20.0000 mg | ORAL_TABLET | Freq: Every day | ORAL | Status: DC
  Administered 2015-03-11 – 2015-03-12 (×2): 20 mg via ORAL
  Filled 2015-03-11 (×2): qty 1

## 2015-03-11 MED ORDER — ENOXAPARIN SODIUM 40 MG/0.4ML ~~LOC~~ SOLN
40.0000 mg | SUBCUTANEOUS | Status: DC
  Administered 2015-03-11: 40 mg via SUBCUTANEOUS
  Filled 2015-03-11 (×2): qty 0.4

## 2015-03-11 MED ORDER — FLUTICASONE PROPIONATE 50 MCG/ACT NA SUSP: 2.0000 | Freq: Every day | NASAL | Status: DC

## 2015-03-11 MED ORDER — GUAIFENESIN ER 600 MG PO TB12: 1200.0000 mg | ORAL_TABLET | Freq: Two times a day (BID) | ORAL | Status: DC

## 2015-03-11 MED ORDER — SENNOSIDES-DOCUSATE SODIUM 8.6-50 MG PO TABS: 1.0000 | ORAL_TABLET | Freq: Two times a day (BID) | ORAL | Status: DC

## 2015-03-11 NOTE — Progress Notes (Signed)
[facility] PROGRESS NOTE  Roger DUDDING YFV:[id number] DOB: 19-Mar-1920 DOA: 03/08/2015 PCP: Garret Reddish, MD  Brief Summary  The patient is a 81 year old male with history of a cough for approximately 1 week prior to admission. He was started on levofloxacin by his primary care doctor but had progressive dyspnea and presented to the emergency department with oxygen levels in the 80s on room air into the mid 90s on 4 L nasal cannula.  Assessment/Plan  Acute hypoxic respiratory failure secondary to possible community-acquired pneumonia  -  Continue doxycycline  -  Repeat chest x-ray demonstrates no evidence of infiltrate -  VQ scan was low probability for pulmonary embolism -  continue to try to transition back to room air  -  Continue increased guaifenesin -  morphine helping for cough suppression -  Continue TID albuterol -  Continue prednisone but reduce dose in AM  Recurrent falls, PT/OT recommending skilled nursing facility.  Declined SNF yesterday, but now agreeable -  SW consult placed and planning d/c for Monday  Hypertension/hyperlipidemia -  Stable, continue statin and losartan  Chronic diastolic heart failure with LEE and now eating better -  Resume lasix  CKD stage 3, creatinine stable near 1.7  Chronic normocytic anemia, hemoglobin trended down slightly with hydration  Chronic sinus congestion -  Continue Flonase but d/c ipratropium nasal spray secondary to mouth dryness  Diet:  regular Access:  PIV IVF:  off Proph:  lovenox  Code Status: full Family Communication: patient alone Disposition Plan:  To SNF Monday.   Consultants: None  procedures: Chest x-ray VQ scan: Low risk for pulmonary embolism  Antibiotics:  Ceftriaxone 7/14 x1  Doxycycline 7/14 >   HPI/Subjective:  Cough is much better, but now he has dry mouth.  He continues to have secretions in throat that are difficult to cough up but this is a chronic problem.  Denies pain,  nausea.  He states he changed his mind about rehab and would like to go to SNF.   Objective: Filed Vitals:   03/10/15 2124 03/11/15 0520 03/11/15 0755 03/11/15 1325  BP:  129/60  116/68  Pulse:  70  79  Temp:  97.3 F (36.3 C)  98.1 F (36.7 C)  TempSrc:  Oral  Oral  Resp:  18  18  Height:      Weight:      SpO2: 94% 94% 95% 98%    Intake/Output Summary (Last 24 hours) at 03/11/15 1504 Last data filed at 03/11/15 1230  Gross per 24 hour  Intake    600 ml  Output    850 ml  Net   -250 ml   Filed Weights   03/08/15 2341  Weight: 75.8 kg (167 lb 1.7 oz)   Body mass index is 25.04 kg/(m^2).  Exam:   General:  Adult male: No acute distress, on nasal canula  HEENT:  NCAT, MMM   Cardiovascular:  RRR, nl S1, S2 no mrg, 2+ pulses, warm extremities  Respiratory:  Very diminished bilateral breath sounds no wheeze, rhonchi, or focal rales  Abdomen:   NABS, soft, NT/ND  MSK:   Normal tone and bulk, trace bilateral pitting LEE   Neuro:  Grossly intact  Data Reviewed: Basic Metabolic Panel:  Recent Labs Lab 03/08/15 1727 03/08/15 2337 03/09/15 0400 03/11/15 0405  NA 145  --  143 143  K 4.1  --  3.9 4.2  CL 114*  --  112* 114*  CO2 23  --  22  21*  GLUCOSE 89  --  140* 121*  BUN 58*  --  58* 50*  CREATININE 1.98*  --  1.73* 1.40*  CALCIUM 8.0*  --  8.2* 8.1*  MG  --  2.4  --   --    Liver Function Tests:  Recent Labs Lab 03/08/15 1727 03/09/15 0400  AST 38 27  ALT 37 37  ALKPHOS 62 63  BILITOT 0.9 1.0  PROT 6.1* 6.2*  ALBUMIN 2.7* 2.7*   No results for input(s): LIPASE, AMYLASE in the last 168 hours. No results for input(s): AMMONIA in the last 168 hours. CBC:  Recent Labs Lab 03/08/15 1727 03/09/15 0400 03/11/15 0405  WBC 9.0 7.8 13.0*  NEUTROABS 7.2  --   --   HGB 11.0* 10.7* 10.3*  HCT 33.6* 32.7* 32.0*  MCV 94.6 94.8 96.4  PLT 179 156 206    Recent Results (from the past 240 hour(s))  Culture, blood (routine x 2) Call MD if unable to  obtain prior to antibiotics being given     Status: None (Preliminary result)   Collection Time: 03/08/15 11:25 PM  Result Value Ref Range Status   Specimen Description BLOOD RIGHT HAND  Final   Special Requests BOTTLES DRAWN AEROBIC AND ANAEROBIC 10CC  Final   Culture   Final    NO GROWTH 2 DAYS Performed at [facility]    Report Status PENDING  Incomplete  Culture, blood (routine x 2) Call MD if unable to obtain prior to antibiotics being given     Status: None (Preliminary result)   Collection Time: 03/08/15 11:37 PM  Result Value Ref Range Status   Specimen Description BLOOD LEFT ANTECUBITAL  Final   Special Requests BOTTLES DRAWN AEROBIC AND ANAEROBIC 10ML  Final   Culture   Final    NO GROWTH 2 DAYS Performed at [facility]    Report Status PENDING  Incomplete  Culture, sputum-assessment     Status: None   Collection Time: 03/10/15  9:12 AM  Result Value Ref Range Status   Specimen Description SPUTUM  Final   Special Requests NONE  Final   Sputum evaluation   Final    THIS SPECIMEN IS ACCEPTABLE. RESPIRATORY CULTURE REPORT TO FOLLOW.   Report Status 03/10/2015 FINAL  Final  Culture, respiratory (NON-Expectorated)     Status: None (Preliminary result)   Collection Time: 03/10/15  9:12 AM  Result Value Ref Range Status   Specimen Description SPUTUM  Final   Special Requests NONE  Final   Gram Stain PENDING  Incomplete   Culture   Final    Culture reincubated for better growth Performed at [facility]    Report Status PENDING  Incomplete     Studies: No results found.  Scheduled Meds: . albuterol  2.5 mg Nebulization TID  . aspirin EC  325 mg Oral Daily  . benzonatate  100 mg Oral TID  . doxycycline  100 mg Oral Q12H  . enoxaparin (LOVENOX) injection  40 mg Subcutaneous Q24H  . fluticasone  1 spray Each Nare Daily  . guaiFENesin  1,200 mg Oral BID  . loratadine  10 mg Oral Daily  . losartan  100 mg Oral Daily  . oxybutynin  2.5 mg Oral  Daily  . pantoprazole  40 mg Oral Daily  . polyethylene glycol  17 g Oral Daily  . pravastatin  40 mg Oral q1800  . [START ON 03/12/2015] predniSONE  40 mg Oral Q  breakfast  . senna-docusate  1 tablet Oral BID   Continuous Infusions:   Principal Problem:   Bronchial pneumonia Active Problems:   Hyperlipidemia   Hypertension   GERD (gastroesophageal reflux disease)   CKD (chronic kidney disease), stage III   Overactive bladder   Hypoxia   Atelectasis, bilateral   Positive D dimer   Acute respiratory failure with hypoxia    Time spent: 30 min    Acelin Ferdig, [facility] Pager [phone]. If 7PM-7AM, please contact night-coverage at [email or web address], password [email or web address] 03/11/2015, 3:04 PM  LOS: 3 days

## 2015-03-11 NOTE — Discharge Summary (Signed)
Physician Discharge Summary  Kinney Sackmann [facility] KAJ:[id number] DOB: 1920-01-11 DOA: 03/08/2015  PCP: Garret Reddish, MD  Admit date: 03/08/2015 Discharge date: 03/12/2015  Recommendations for Outpatient Follow-up:  1. Transfer to SNF for PT/OT/SLP 2. Continue doxycycline and prednisone through 7/20 then discontinue.   3. Repeat BMP and CBC in 1 week to f/u kidney function, anemia, and leukocytosis 4. Wean oxygen to room air as tolerated  Discharge Diagnoses:  Principal Problem:   Bronchial pneumonia Active Problems:   Hyperlipidemia   Hypertension   GERD (gastroesophageal reflux disease)   CKD (chronic kidney disease), stage III   Overactive bladder   Hypoxia   Atelectasis, bilateral   Positive D dimer   Acute respiratory failure with hypoxia   Discharge Condition: Stable, improved  Diet recommendation: Regular  Wt Readings from Last 3 Encounters:  03/08/15 75.8 kg (167 lb 1.7 oz)  01/26/15 78.472 kg (173 lb)  11/28/14 78.926 kg (174 lb)    History of present illness:  The patient is a 81 year old male with history of a cough for approximately 1 week prior to admission. He was started on levofloxacin by his primary care doctor but had progressive dyspnea and presented to the emergency department with oxygen levels in the 80s on room air into the mid 90s on 4 L nasal cannula.  Hospital Course:   Acute hypoxic respiratory failure secondary to possible community-acquired pneumonia.  He underwent VQ scan which was low probability for pulmonary embolism.  He should continue doxycycline through 7/20, then discontinue. He was started on prednisone for his wheezy cough and albuterol as needed. He was given guaifenesin to thin his secretions. Multiple cough medications were unsuccessful in suppressing his cough so he was given very low-dose morphine which helped.  He was also continued on PPI.  Recurrent falls, PT/OT recommended skilled nursing facility for ongoing  therapy.  Hypertension/hyperlipidemia, stable, continue statin and losartan  Chronic diastolic heart failure with LEE and now eating better.  Continue Lasix.  CKD stage 3, creatinine stable near 1.7  Chronic normocytic anemia, hemoglobin trended down slightly with hydration, 10.3mg /dl on 7/17.  Chronic sinus congestion.  Restarted Flonase but d/c ipratropium nasal spray secondary to mouth dryness  Consultants: None  procedures: Chest x-ray VQ scan: Low risk for pulmonary embolism  Antibiotics:  Ceftriaxone 7/14 x1  Doxycycline 7/14 >  Discharge Exam: Filed Vitals:   03/12/15 0509  BP: 136/79  Pulse: 72  Temp: 97.4 F (36.3 C)  Resp: 16   Filed Vitals:   03/11/15 1947 03/11/15 2048 03/12/15 0509 03/12/15 0827  BP:  96/59 136/79   Pulse:  75 72   Temp:  97.6 F (36.4 C) 97.4 F (36.3 C)   TempSrc:  Oral Oral   Resp:  18 16   Height:      Weight:      SpO2: 93% 95% 98% 93%    General: Adult male: No acute distress, on nasal canula, pleasant and anxious to leave  HEENT: NCAT, MMM   Cardiovascular: RRR, nl S1, S2 no mrg, 2+ pulses, warm extremities  Respiratory: Diminished bilateral breath sounds, faint wheeze, no rhonchi or focal rales  Abdomen: NABS, soft, NT/ND  MSK: Normal tone and bulk, trace bilateral pitting LEE   Neuro: Grossly intact  Discharge Instructions      Discharge Instructions    Call MD for:  difficulty breathing, headache or visual disturbances    Complete by:  As directed      Call MD for:  extreme fatigue    Complete by:  As directed      Call MD for:  hives    Complete by:  As directed      Call MD for:  persistant dizziness or light-headedness    Complete by:  As directed      Call MD for:  persistant nausea and vomiting    Complete by:  As directed      Call MD for:  severe uncontrolled pain    Complete by:  As directed      Call MD for:  temperature >100.4    Complete by:  As directed      Diet general     Complete by:  As directed      Increase activity slowly    Complete by:  As directed             Medication List    STOP taking these medications        levofloxacin 750 MG tablet  Commonly known as:  LEVAQUIN      TAKE these medications        benzonatate 100 MG capsule  Commonly known as:  TESSALON  Take 1 capsule (100 mg total) by mouth 2 (two) times daily as needed for cough.     cetirizine 10 MG tablet  Commonly known as:  ZYRTEC  TAKE 1 TABLET BY MOUTH EVERY DAY     doxycycline 100 MG tablet  Commonly known as:  VIBRA-TABS  Take 1 tablet (100 mg total) by mouth every 12 (twelve) hours.     ECOTRIN 325 MG EC tablet  Generic drug:  aspirin  Take 325 mg by mouth daily.     fluticasone 50 MCG/ACT nasal spray  Commonly known as:  FLONASE  Place 2 sprays into both nostrils daily.     furosemide 20 MG tablet  Commonly known as:  LASIX  Take 1 tablet (20 mg total) by mouth daily.     guaiFENesin 600 MG 12 hr tablet  Commonly known as:  MUCINEX  Take 2 tablets (1,200 mg total) by mouth 2 (two) times daily.     losartan 100 MG tablet  Commonly known as:  COZAAR  TAKE 1 TABLET BY MOUTH EVERY DAY     lovastatin 40 MG tablet  Commonly known as:  MEVACOR  TAKE 1 TABLET BY MOUTH EVERY DAY     morphine 15 MG tablet  Commonly known as:  MSIR  Take 0.5 tablets (7.5 mg total) by mouth every 2 (two) hours as needed (cough).     multivitamin with minerals tablet  Take 1 tablet by mouth daily.     omeprazole 20 MG capsule  Commonly known as:  PRILOSEC  Take 1 capsule (20 mg total) by mouth 2 (two) times daily before a meal.     oxybutynin 5 MG tablet  Commonly known as:  DITROPAN  Take 0.5 tablets (2.5 mg total) by mouth daily.     polyethylene glycol powder powder  Commonly known as:  MIRALAX  Take 17 g by mouth daily.     predniSONE 20 MG tablet  Commonly known as:  DELTASONE  Take 2 tablets (40 mg total) by mouth daily with breakfast.     senna-docusate  8.6-50 MG per tablet  Commonly known as:  Senokot-S  Take 1 tablet by mouth 2 (two) times daily.       Follow-up Information    Follow up with Garret Reddish, MD.  Schedule an appointment as soon as possible for a visit in 2 weeks.   Specialty:  Family Medicine   Contact information:   [address] [place] [address] [phone]        The results of significant diagnostics from this hospitalization (including imaging, microbiology, ancillary and laboratory) are listed below for reference.    Significant Diagnostic Studies: Dg Chest 1 View  03/02/2015   CLINICAL DATA:  Cough and fever.  EXAM: CHEST  1 VIEW  COMPARISON:  11/19/2014  FINDINGS: Heart size normal. Bibasilar subsegmental atelectasis and/or infiltrates are present. Eventration of the diaphragms with eventration of the colon under the diaphragms noted. Similar finding noted on prior exam. To completely exclude bowel distention or free air abdominal series should be obtained. No acute bony abnormality.  IMPRESSION: 1. Low lung volumes with bibasilar subsegmental atelectasis and/or infiltrates .  2. Eventration hemidiaphragms with interposition of colon under the hemidiaphragms. Similar findings noted on prior exam . To exclude exclude bowel distention or free air abdominal series should be obtained These results will be called to the ordering clinician or representative by the Radiologist Assistant, and communication documented in the PACS or zVision Dashboard.   Electronically Signed   By: Marcello Moores  Register   On: 03/02/2015 16:45   Dg Chest 2 View  03/09/2015   CLINICAL DATA:  Patient with cough and shortness breath.  EXAM: CHEST  2 VIEW  COMPARISON:  Chest radiograph 03/08/2015  FINDINGS: Low lung volumes. Stable cardiac and mediastinal contours with volume loss and calcification of the thoracic aorta. Bilateral lower lung heterogeneous opacities. Small bilateral pleural effusions. Mid thoracic spine degenerative changes.  Age-indeterminate compression deformity of the thoracolumbar junction.  IMPRESSION: Low lung volumes with bibasilar atelectasis and or scarring.  Compression deformity of the thoracolumbar junction of uncertain chronicity.   Electronically Signed   By: Lovey Newcomer M.D.   On: 03/09/2015 09:54   Dg Chest 2 View  03/08/2015   CLINICAL DATA:  Productive cough and fever for 1 week, shortness of Breath  EXAM: CHEST - 2 VIEW  COMPARISON:  03/02/2015  FINDINGS: Cardiac shadow is stable. Bibasilar changes are again identified stable from the previous exam. No new focal infiltrate is seen. No sizable effusion is noted. Compression deformity is noted at the thoracolumbar junction of uncertain chronicity.  IMPRESSION: Bibasilar atelectatic changes stable from the previous exam.  Compression deformity at the thoracolumbar junction of uncertain chronicity.   Electronically Signed   By: Inez Catalina M.D.   On: 03/08/2015 15:52   Nm Pulmonary Perf And Vent  03/09/2015   CLINICAL DATA:  Shortness of breath and fever  EXAM: NUCLEAR MEDICINE VENTILATION - PERFUSION LUNG SCAN  Views: Anterior, posterior, left lateral, right lateral, RPO, LPO, RAO, LAO -ventilation and perfusion  Radionuclide: Technetium 75m DTPA -ventilation ; Technetium 82m macroaggregated albumin-perfusion  Dose:  40.0 mCi-ventilation; 6.2 mCi-perfusion  Route of administration: Inhalation -ventilation ; intravenous -perfusion  COMPARISON:  Chest radiograph March 09, 2015  FINDINGS: Ventilation: Radiotracer uptake on the ventilation study is essentially homogeneous and symmetric bilaterally.  Perfusion: Radiotracer uptake on the perfusion study is homogeneous and symmetric bilaterally.  IMPRESSION: No appreciable ventilation or perfusion defects. Very low probability of pulmonary embolus.   Electronically Signed   By: Lowella Grip III M.D.   On: 03/09/2015 10:55    Microbiology: Recent Results (from the past 240 hour(s))  Culture, blood (routine x 2)  Call MD if unable to obtain prior to antibiotics  being given     Status: None (Preliminary result)   Collection Time: 03/08/15 11:25 PM  Result Value Ref Range Status   Specimen Description BLOOD RIGHT HAND  Final   Special Requests BOTTLES DRAWN AEROBIC AND ANAEROBIC 10CC  Final   Culture   Final    NO GROWTH 2 DAYS Performed at [facility]    Report Status PENDING  Incomplete  Culture, blood (routine x 2) Call MD if unable to obtain prior to antibiotics being given     Status: None (Preliminary result)   Collection Time: 03/08/15 11:37 PM  Result Value Ref Range Status   Specimen Description BLOOD LEFT ANTECUBITAL  Final   Special Requests BOTTLES DRAWN AEROBIC AND ANAEROBIC 10ML  Final   Culture   Final    NO GROWTH 2 DAYS Performed at [facility]    Report Status PENDING  Incomplete  Culture, sputum-assessment     Status: None   Collection Time: 03/10/15  9:12 AM  Result Value Ref Range Status   Specimen Description SPUTUM  Final   Special Requests NONE  Final   Sputum evaluation   Final    THIS SPECIMEN IS ACCEPTABLE. RESPIRATORY CULTURE REPORT TO FOLLOW.   Report Status 03/10/2015 FINAL  Final  Culture, respiratory (NON-Expectorated)     Status: None   Collection Time: 03/10/15  9:12 AM  Result Value Ref Range Status   Specimen Description SPUTUM  Final   Special Requests NONE  Final   Gram Stain   Final    MODERATE WBC PRESENT,BOTH PMN AND MONONUCLEAR FEW SQUAMOUS EPITHELIAL CELLS PRESENT FEW YEAST RARE GRAM POSITIVE COCCI IN PAIRS Performed at [facility]    Culture   Final    MODERATE CANDIDA ALBICANS Performed at [facility]    Report Status 03/12/2015 FINAL  Final     Labs: Basic Metabolic Panel:  Recent Labs Lab 03/08/15 1727 03/08/15 2337 03/09/15 0400 03/11/15 0405 03/12/15 0412  NA 145  --  143 143 144  K 4.1  --  3.9 4.2 4.5  CL 114*  --  112* 114* 115*  CO2 23  --  22 21* 23  GLUCOSE 89  --  140* 121* 121*   BUN 58*  --  58* 50* 46*  CREATININE 1.98*  --  1.73* 1.40* 1.32*  CALCIUM 8.0*  --  8.2* 8.1* 8.2*  MG  --  2.4  --   --   --    Liver Function Tests:  Recent Labs Lab 03/08/15 1727 03/09/15 0400  AST 38 27  ALT 37 37  ALKPHOS 62 63  BILITOT 0.9 1.0  PROT 6.1* 6.2*  ALBUMIN 2.7* 2.7*   No results for input(s): LIPASE, AMYLASE in the last 168 hours. No results for input(s): AMMONIA in the last 168 hours. CBC:  Recent Labs Lab 03/08/15 1727 03/09/15 0400 03/11/15 0405  WBC 9.0 7.8 13.0*  NEUTROABS 7.2  --   --   HGB 11.0* 10.7* 10.3*  HCT 33.6* 32.7* 32.0*  MCV 94.6 94.8 96.4  PLT 179 156 206   Cardiac Enzymes:  Recent Labs Lab 03/08/15 1727  TROPONINI <0.03   BNP: BNP (last 3 results)  Recent Labs  03/08/15 1728  BNP 390.6*    ProBNP (last 3 results) No results for input(s): PROBNP in the last 8760 hours.  CBG: No results for input(s): GLUCAP in the last 168 hours.  Time coordinating discharge: 35 minutes  Signed:  Cerra Eisenhower  [facility] 03/12/2015, 12:21 PM

## 2015-03-11 NOTE — Progress Notes (Signed)
[place] for Lovenox Indication: VTE prophylaxis  Allergies  Allergen Reactions  . Amlodipine Besylate     REACTION: swelling of feet  . Aspirin     REACTION: GI upset can tolerate ecotrin  . Hydrocodone     GI upset    Patient Measurements:  Height: 5' 8.5" (174 cm) Weight: 167 lb 1.7 oz (75.8 kg) IBW/kg (Calculated) : 69.55 Vital Signs: Temp: 97.3 F (36.3 C) (07/17 0520) Temp Source: Oral (07/17 0520) BP: 129/60 mmHg (07/17 0520) Pulse Rate: 70 (07/17 0520)  Labs:  Recent Labs  03/08/15 1727 03/09/15 0400 03/11/15 0405  HGB 11.0* 10.7* 10.3*  HCT 33.6* 32.7* 32.0*  PLT 179 156 206  CREATININE 1.98* 1.73* 1.40*  TROPONINI <0.03  --   --     Estimated Creatinine Clearance: 32.5 mL/min (by C-G formula based on Cr of 1.4).   Medications:  . albuterol  2.5 mg Nebulization TID  . aspirin EC  325 mg Oral Daily  . benzonatate  100 mg Oral TID  . doxycycline  100 mg Oral Q12H  . enoxaparin (LOVENOX) injection  30 mg Subcutaneous Q24H  . fluticasone  1 spray Each Nare Daily  . guaiFENesin  1,200 mg Oral BID  . ipratropium  2 spray Each Nare BID  . loratadine  10 mg Oral Daily  . losartan  100 mg Oral Daily  . oxybutynin  2.5 mg Oral Daily  . pantoprazole  40 mg Oral Daily  . polyethylene glycol  17 g Oral Daily  . pravastatin  40 mg Oral q1800  . predniSONE  60 mg Oral Q breakfast  . senna-docusate  1 tablet Oral BID      Assessment: 89 yoM admitted on 7/14 with c/o weakness and cough.  D-dimer is elevated at 11.57.  Chest CT angio pending and pharmacy was initially consulted to start full dose lovenox for r/o VTE.  VQ scan was low probability for PE and pharmacy is consulted to change to prophylactic Lovenox.  Today, 03/11/2015:  Lovenox 1 mg/kg last given 7/14 at ~ 2030 but changed to prophylaxis dosing w/ VQ scan findings  Scr 1.4 - improving.  CrCl ~32.5 ml/min  CBC: Hgb 10.3 (relatively stable), Plt  WNL  Goal of Therapy:  VTE prophylaxis, Lovenox per renal function Monitor platelets by anticoagulation protocol: Yes   Plan:  - Increase Lovenox 40 mg SQ q24h - Pharmacy to sign off note writing, but will f/u peripherally and intervene if further dose adjustments necessary for renal function  Doreene Eland, PharmD, BCPS.   Pager: [phone] 03/11/2015 7:37 AM

## 2015-03-12 DIAGNOSIS — I959 Hypotension, unspecified: Secondary | ICD-10-CM | POA: Diagnosis not present

## 2015-03-12 DIAGNOSIS — J180 Bronchopneumonia, unspecified organism: Secondary | ICD-10-CM | POA: Diagnosis not present

## 2015-03-12 DIAGNOSIS — I10 Essential (primary) hypertension: Secondary | ICD-10-CM | POA: Diagnosis not present

## 2015-03-12 DIAGNOSIS — R262 Difficulty in walking, not elsewhere classified: Secondary | ICD-10-CM | POA: Diagnosis not present

## 2015-03-12 DIAGNOSIS — R531 Weakness: Secondary | ICD-10-CM | POA: Diagnosis not present

## 2015-03-12 DIAGNOSIS — J9601 Acute respiratory failure with hypoxia: Secondary | ICD-10-CM | POA: Diagnosis not present

## 2015-03-12 DIAGNOSIS — M6281 Muscle weakness (generalized): Secondary | ICD-10-CM | POA: Diagnosis not present

## 2015-03-12 DIAGNOSIS — R791 Abnormal coagulation profile: Secondary | ICD-10-CM | POA: Diagnosis not present

## 2015-03-12 DIAGNOSIS — D72829 Elevated white blood cell count, unspecified: Secondary | ICD-10-CM | POA: Diagnosis not present

## 2015-03-12 DIAGNOSIS — J189 Pneumonia, unspecified organism: Secondary | ICD-10-CM | POA: Diagnosis not present

## 2015-03-12 DIAGNOSIS — R278 Other lack of coordination: Secondary | ICD-10-CM | POA: Diagnosis not present

## 2015-03-12 DIAGNOSIS — K219 Gastro-esophageal reflux disease without esophagitis: Secondary | ICD-10-CM | POA: Diagnosis not present

## 2015-03-12 DIAGNOSIS — K5900 Constipation, unspecified: Secondary | ICD-10-CM | POA: Diagnosis not present

## 2015-03-12 DIAGNOSIS — N189 Chronic kidney disease, unspecified: Secondary | ICD-10-CM | POA: Diagnosis not present

## 2015-03-12 DIAGNOSIS — R5381 Other malaise: Secondary | ICD-10-CM | POA: Diagnosis not present

## 2015-03-12 DIAGNOSIS — N183 Chronic kidney disease, stage 3 (moderate): Secondary | ICD-10-CM | POA: Diagnosis not present

## 2015-03-12 DIAGNOSIS — I5030 Unspecified diastolic (congestive) heart failure: Secondary | ICD-10-CM | POA: Diagnosis not present

## 2015-03-12 LAB — CULTURE, RESPIRATORY W GRAM STAIN

## 2015-03-12 LAB — LEGIONELLA ANTIGEN, URINE

## 2015-03-12 LAB — BASIC METABOLIC PANEL
Anion gap: 6 (ref 5–15)
BUN: 46 mg/dL — AB (ref 6–20)
CALCIUM: 8.2 mg/dL — AB (ref 8.9–10.3)
CO2: 23 mmol/L (ref 22–32)
Chloride: 115 mmol/L — ABNORMAL HIGH (ref 101–111)
Creatinine, Ser: 1.32 mg/dL — ABNORMAL HIGH (ref 0.61–1.24)
GFR calc Af Amer: 52 mL/min — ABNORMAL LOW (ref >60)
GFR, EST NON AFRICAN AMERICAN: 45 mL/min — AB (ref >60)
Glucose, Bld: 121 mg/dL — ABNORMAL HIGH (ref 65–99)
Potassium: 4.5 mmol/L (ref 3.5–5.1)
Sodium: 144 mmol/L (ref 135–145)

## 2015-03-12 LAB — CULTURE, RESPIRATORY

## 2015-03-12 NOTE — Progress Notes (Signed)
CSW continuing to follow.   CSW received notification from MD that pt medically ready for discharge today.  CSW followed up with pt at bedside. Pt very anxious and eager to leave the hospital. CSW reviewed SNF bed offers with pt and pt asked for CSW to contact pt daughter, [name] to discuss SNF bed offers. CSW contacted pt daughter, [name] while present at bedside and discussed SNF bed offers. Pt and pt daughter initially chose [facility], but upon CSW contacting [facility] learned that [facility] does not currently have any male beds available today.  CSW re-reviewed options with pt daughter, [name] and pt and pt daughter at agreeable to [facility] and [facility]. CSW contacted [facility] and confirmed that facility has bed available today and can accept pt.  CSW notified pt and pt daughter and they are agreeable to transition to [facility] today.  CSW to facilitate pt discharge needs.  Alison Murray, MSW, [place] Work [phone]

## 2015-03-12 NOTE — Care Management Important Message (Signed)
Important Message  Patient Details  Name: Roger [name] MRN: [id number] Date of Birth: 1919/10/24   Medicare Important Message Given:  Yes-second notification given    Camillo Flaming 03/12/2015, 11:40 AMImportant Message  Patient Details  Name: Roger [name] MRN: [id number] Date of Birth: [birth year]   Medicare Important Message Given:  Yes-second notification given    Camillo Flaming 03/12/2015, 11:40 AM

## 2015-03-12 NOTE — Progress Notes (Signed)
Physical Therapy Treatment Patient Details Name: Roger [name] MRN: [id number] DOB: 11-Aug-1920 Today's Date: 03/12/2015    History of Present Illness ZAKARY KIMURA is a 81 y.o. male with below past medical history who is brought to the emergency department after having persistent cough for about a week.  Dx of bronchial PNA.    PT Comments    Pt weaker today, not able to ambulate due to poor standing balance. Assisted pt to [facility] then to recliner. SaO2 92% on RA with activity.   Follow Up Recommendations  SNF     Equipment Recommendations  None recommended by PT    Recommendations for Other Services       Precautions / Restrictions Precautions Precautions: Fall Precaution Comments: 2 falls in past week Restrictions Weight Bearing Restrictions: No    Mobility  Bed Mobility Overal bed mobility: Needs Assistance Bed Mobility: Supine to Sit     Supine to sit: Mod assist     General bed mobility comments: assist to raise trunk and pivot hips to EOB  Transfers Overall transfer level: Needs assistance Equipment used: Rolling walker (2 wheeled) Transfers: Sit to/from [facility] Sit to Stand: From elevated surface;Mod assist Stand pivot transfers: Mod assist       General transfer comment: mod A to rise and steady, unable to stand fully upright, posterior lean, B knees flexed, SaO2 92% on RA with activity, 2-3/4 dyspnea  Ambulation/Gait             General Gait Details: NT-pt unsteady in standing   Stairs            Wheelchair Mobility    Modified Rankin (Stroke Patients Only)       Balance     Sitting balance-Leahy Scale: Good       Standing balance-Leahy Scale: Poor                      Cognition Arousal/Alertness: Awake/alert Behavior During Therapy: WFL for tasks assessed/performed Overall Cognitive Status: Within Functional Limits for tasks assessed                      Exercises      General  Comments        Pertinent Vitals/Pain Pain Assessment: No/denies pain    Home Living                      Prior Function            PT Goals (current goals can now be found in the care plan section) Acute Rehab PT Goals Patient Stated Goal: to return to more independence.  PT Goal Formulation: With patient Time For Goal Achievement: 03/23/15 Potential to Achieve Goals: Fair Progress towards PT goals: Not progressing toward goals - comment (weaker today, not able to walk)    Frequency  Min 3X/week    PT Plan      Co-evaluation             End of Session Equipment Utilized During Treatment: Gait belt;Oxygen Activity Tolerance: Patient tolerated treatment well;Patient limited by fatigue Patient left: in chair;with call bell/phone within reach     Time: 1159-1224 PT Time Calculation (min) (ACUTE ONLY): 25 min  Charges:  $Therapeutic Activity: 23-37 mins                    G Codes:      Blondell Reveal [employer]  03/12/2015, 12:44 PM [phone]

## 2015-03-12 NOTE — Progress Notes (Signed)
Patient discharged to SNF, all discharge medications and instructions sent to facility.  Patient to be transported via [facility].

## 2015-03-12 NOTE — Clinical Social Work Placement (Signed)
   CLINICAL SOCIAL WORK PLACEMENT  NOTE  Date:  03/12/2015  Patient Details  Name: Roger [name] MRN: [id number] Date of Birth: 1919/12/06  Clinical Social Work is seeking post-discharge placement for this patient at the [place] level of care (*CSW will initial, date and re-position this form in  chart as items are completed):  Yes   Patient/family provided with [place] Work Department's list of facilities offering this level of care within the geographic area requested by the patient (or if unable, by the patient's family).  Yes   Patient/family informed of their freedom to choose among providers that offer the needed level of care, that participate in Medicare, Medicaid or managed care program needed by the patient, have an available bed and are willing to accept the patient.  Yes   Patient/family informed of [place]'s ownership interest in [facility] and [facility], as well as of the fact that they are under no obligation to receive care at these facilities.  PASRR submitted to EDS on 03/11/15     PASRR number received on 03/11/15     Existing PASRR number confirmed on       FL2 transmitted to all facilities in geographic area requested by pt/family on 03/11/15     FL2 transmitted to all facilities within larger geographic area on       Patient informed that his/her managed care company has contracts with or will negotiate with certain facilities, including the following:        Yes   Patient/family informed of bed offers received.  Patient chooses bed at [facility]     Physician recommends and patient chooses bed at      Patient to be transferred to [facility] on 03/12/15.  Patient to be transferred to facility by ambulance [name])     Patient family notified on 03/12/15 of transfer.  Name of family member notified:  pt and pt daughter, [name] notified at bedside     PHYSICIAN       Additional Comment:     _______________________________________________ Ladell Pier, LCSW 03/12/2015, 1:56 PM

## 2015-03-12 NOTE — Progress Notes (Signed)
Pt for discharge to [facility].  CSW facilitated pt discharge needs including contacting facility, faxing pt discharge information via [facility], discussing with pt and pt daughter, [name] at bedside, providing RN phone number to call report, and arranging ambulance transport for pt to [facility].  Pt and pt daughter appreciative of CSW support and assistance. Pt very eager to go to rehab to regain his strength in order to return home.   No further social work needs identified at this time.  CSW signing off.   Alison Murray, MSW, [place] Work [phone]

## 2015-03-13 ENCOUNTER — Encounter: Payer: Self-pay | Admitting: Internal Medicine

## 2015-03-13 ENCOUNTER — Non-Acute Institutional Stay (SKILLED_NURSING_FACILITY): Payer: Medicare Other | Admitting: Internal Medicine

## 2015-03-13 DIAGNOSIS — K219 Gastro-esophageal reflux disease without esophagitis: Secondary | ICD-10-CM

## 2015-03-13 DIAGNOSIS — N3281 Overactive bladder: Secondary | ICD-10-CM

## 2015-03-13 DIAGNOSIS — N183 Chronic kidney disease, stage 3 unspecified: Secondary | ICD-10-CM

## 2015-03-13 DIAGNOSIS — R5381 Other malaise: Secondary | ICD-10-CM

## 2015-03-13 DIAGNOSIS — E46 Unspecified protein-calorie malnutrition: Secondary | ICD-10-CM

## 2015-03-13 DIAGNOSIS — J189 Pneumonia, unspecified organism: Secondary | ICD-10-CM | POA: Diagnosis not present

## 2015-03-13 DIAGNOSIS — K5900 Constipation, unspecified: Secondary | ICD-10-CM | POA: Diagnosis not present

## 2015-03-13 DIAGNOSIS — N189 Chronic kidney disease, unspecified: Secondary | ICD-10-CM | POA: Diagnosis not present

## 2015-03-13 DIAGNOSIS — D72829 Elevated white blood cell count, unspecified: Secondary | ICD-10-CM | POA: Diagnosis not present

## 2015-03-13 DIAGNOSIS — N179 Acute kidney failure, unspecified: Secondary | ICD-10-CM

## 2015-03-13 DIAGNOSIS — D631 Anemia in chronic kidney disease: Secondary | ICD-10-CM | POA: Diagnosis not present

## 2015-03-13 NOTE — Progress Notes (Signed)
Patient ID: CEPHAS REVARD, male   DOB: July 03, 1920, 81 y.o.   MRN: [id number]      Facility: [facility]    PCP: Garret Reddish, MD  Code Status: full code  Allergies  Allergen Reactions  . Amlodipine Besylate     REACTION: swelling of feet  . Aspirin     REACTION: GI upset can tolerate ecotrin  . Hydrocodone     GI upset    Chief Complaint  Patient presents with  . New Admit To SNF     HPI:  81 y.o. patient is here for short term rehabilitation post hospital admission from 03/08/15-03/12/15 with acute hypoxic respiratory failure from bronchial pneumonia and recurrent falls. Pulmonary embolism was ruled out. He responded well to iv antibiotics and was switched to po antibiotics. He has PMH of HLD, HTN, GERD, CKD stage 3 among others. He is seen in his room today. He is very hard of hearing and this limits HPI and ROS. He complaints of generalized weakness and being constipated. No other concerns.  Review of Systems:  Constitutional: Negative for fever, chills, diaphoresis.  HENT: Negative for headache, congestion, nasal discharge Eyes: Negative for eye pain, blurred vision, double vision and discharge.  Respiratory: Negative for wheezing. Breathing has improved, currently on o2. Positive for cough Cardiovascular: Negative for chest pain, palpitations, leg swelling.  Gastrointestinal: Negative for heartburn, nausea, vomiting, abdominal pain. Genitourinary: Negative for dysuria Musculoskeletal: Negative for back pain, falls in facility Skin: Negative for itching, rash.  Neurological: Negative for dizziness     Past Medical History  Diagnosis Date  . CARCINOMA, SKIN, SQUAMOUS CELL 05/26/2008  . GERD 02/22/2007  . HYPERLIPIDEMIA 02/22/2007  . HYPERTENSION 06/17/2007  . MACULAR DEGENERATION 02/22/2007  . OSTEOARTHRITIS, HIP, RIGHT 06/05/2009  . Ovarian cancer Mother   . Emphysema of lung Brother  . Colon cancer Brother #2   Past Surgical History   Procedure Laterality Date  . Laminectomy  1988   Social History:   reports that he has quit smoking. He does not have any smokeless tobacco history on file. He reports that he does not drink alcohol. His drug history is not on file.  Family History  Problem Relation Age of Onset  . Breast cancer Daughter     Medications:   Medication List       This list is accurate as of: 03/13/15  2:06 PM.  Always use your most recent med list.               benzonatate 100 MG capsule  Commonly known as:  TESSALON  Take 1 capsule (100 mg total) by mouth 2 (two) times daily as needed for cough.     cetirizine 10 MG tablet  Commonly known as:  ZYRTEC  TAKE 1 TABLET BY MOUTH EVERY DAY     doxycycline 100 MG tablet  Commonly known as:  VIBRA-TABS  Take 1 tablet (100 mg total) by mouth every 12 (twelve) hours.     ECOTRIN 325 MG EC tablet  Generic drug:  aspirin  Take 325 mg by mouth daily.     fluticasone 50 MCG/ACT nasal spray  Commonly known as:  FLONASE  Place 2 sprays into both nostrils daily.     furosemide 20 MG tablet  Commonly known as:  LASIX  Take 1 tablet (20 mg total) by mouth daily.     guaiFENesin 600 MG 12 hr tablet  Commonly known as:  MUCINEX  Take 2  tablets (1,200 mg total) by mouth 2 (two) times daily.     losartan 100 MG tablet  Commonly known as:  COZAAR  TAKE 1 TABLET BY MOUTH EVERY DAY     lovastatin 40 MG tablet  Commonly known as:  MEVACOR  TAKE 1 TABLET BY MOUTH EVERY DAY     morphine 15 MG tablet  Commonly known as:  MSIR  Take 0.5 tablets (7.5 mg total) by mouth every 2 (two) hours as needed (cough).     multivitamin with minerals tablet  Take 1 tablet by mouth daily.     omeprazole 20 MG capsule  Commonly known as:  PRILOSEC  Take 1 capsule (20 mg total) by mouth 2 (two) times daily before a meal.     oxybutynin 5 MG tablet  Commonly known as:  DITROPAN  Take 0.5 tablets (2.5 mg total) by mouth daily.     polyethylene glycol powder  powder  Commonly known as:  MIRALAX  Take 17 g by mouth daily.     predniSONE 20 MG tablet  Commonly known as:  DELTASONE  Take 2 tablets (40 mg total) by mouth daily with breakfast.     senna-docusate 8.6-50 MG per tablet  Commonly known as:  Senokot-S  Take 1 tablet by mouth 2 (two) times daily.         Physical Exam: Filed Vitals:   03/13/15 1400  BP: 144/88  Pulse: 70  Temp: 97 F (36.1 C)  Resp: 18  SpO2: 96%    General- elderly male, frail, chronically ill appearing Head- normocephalic, atraumatic Throat- moist mucus membrane Eyes- PERRLA, EOMI, no pallor, no icterus, no discharge Neck- no cervical lymphadenopathy, no jugular vein distension Cardiovascular- normal s1,s2, no murmurs, palpable dorsalis pedis, trace leg edema Respiratory- poor air entry with rhonchi present, no wheeze, no crackles, no use of accessory muscles, on o2 Abdomen- bowel sounds present, soft, non tender Musculoskeletal- able to move all 4 extremities, generalized weakness Neurological- no focal deficit Skin- warm and dry, senile purpura Psychiatry- normal mood and affect    Labs reviewed: Basic Metabolic Panel:  Recent Labs  03/08/15 2337 03/09/15 0400 03/11/15 0405 03/12/15 0412  NA  --  143 143 144  K  --  3.9 4.2 4.5  CL  --  112* 114* 115*  CO2  --  22 21* 23  GLUCOSE  --  140* 121* 121*  BUN  --  58* 50* 46*  CREATININE  --  1.73* 1.40* 1.32*  CALCIUM  --  8.2* 8.1* 8.2*  MG 2.4  --   --   --    Liver Function Tests:  Recent Labs  03/02/15 1520 03/08/15 1727 03/09/15 0400  AST 24 38 27  ALT 15 37 37  ALKPHOS 72 62 63  BILITOT 0.6 0.9 1.0  PROT 7.6 6.1* 6.2*  ALBUMIN 3.9 2.7* 2.7*   No results for input(s): LIPASE, AMYLASE in the last 8760 hours. No results for input(s): AMMONIA in the last 8760 hours. CBC:  Recent Labs  03/02/15 1520 03/08/15 1727 03/09/15 0400 03/11/15 0405  WBC 8.6 9.0 7.8 13.0*  NEUTROABS 7.7 7.2  --   --   HGB 12.1* 11.0*  10.7* 10.3*  HCT 36.4* 33.6* 32.7* 32.0*  MCV 94.0 94.6 94.8 96.4  PLT 156.0 179 156 206   Cardiac Enzymes:  Recent Labs  03/08/15 1727  TROPONINI <0.03   BNP: Invalid input(s): POCBNP CBG: No results for input(s): GLUCAP in the last  8760 hours.  Radiological Exams: Dg Chest 2 View  03/09/2015   CLINICAL DATA:  Patient with cough and shortness breath.  EXAM: CHEST  2 VIEW  COMPARISON:  Chest radiograph 03/08/2015  FINDINGS: Low lung volumes. Stable cardiac and mediastinal contours with volume loss and calcification of the thoracic aorta. Bilateral lower lung heterogeneous opacities. Small bilateral pleural effusions. Mid thoracic spine degenerative changes. Age-indeterminate compression deformity of the thoracolumbar junction.  IMPRESSION: Low lung volumes with bibasilar atelectasis and or scarring.  Compression deformity of the thoracolumbar junction of uncertain chronicity.   Electronically Signed   By: Lovey Newcomer M.D.   On: 03/09/2015 09:54   Dg Chest 2 View  03/08/2015   CLINICAL DATA:  Productive cough and fever for 1 week, shortness of Breath  EXAM: CHEST - 2 VIEW  COMPARISON:  03/02/2015  FINDINGS: Cardiac shadow is stable. Bibasilar changes are again identified stable from the previous exam. No new focal infiltrate is seen. No sizable effusion is noted. Compression deformity is noted at the thoracolumbar junction of uncertain chronicity.  IMPRESSION: Bibasilar atelectatic changes stable from the previous exam.  Compression deformity at the thoracolumbar junction of uncertain chronicity.   Electronically Signed   By: Inez Catalina M.D.   On: 03/08/2015 15:52   Nm Pulmonary Perf And Vent  03/09/2015   CLINICAL DATA:  Shortness of breath and fever  EXAM: NUCLEAR MEDICINE VENTILATION - PERFUSION LUNG SCAN  Views: Anterior, posterior, left lateral, right lateral, RPO, LPO, RAO, LAO -ventilation and perfusion  Radionuclide: Technetium 55m DTPA -ventilation ; Technetium 29m macroaggregated  albumin-perfusion  Dose:  40.0 mCi-ventilation; 6.2 mCi-perfusion  Route of administration: Inhalation -ventilation ; intravenous -perfusion  COMPARISON:  Chest radiograph March 09, 2015  FINDINGS: Ventilation: Radiotracer uptake on the ventilation study is essentially homogeneous and symmetric bilaterally.  Perfusion: Radiotracer uptake on the perfusion study is homogeneous and symmetric bilaterally.  IMPRESSION: No appreciable ventilation or perfusion defects. Very low probability of pulmonary embolus.   Electronically Signed   By: Lowella Grip III M.D.   On: 03/09/2015 10:55    Assessment/Plan  Generalized weakness With deconditioning. Will have him work with physical therapy and occupational therapy team to help with gait training and muscle strengthening exercises.fall precautions. Skin care. Encourage to be out of bed.   CAP Continue and complete course of doxycycline until 03/14/15. Add incentive spirometer for pulmonary toileting. Continue o2 by nasal canula. On prn morphine with mucinex for cough. Continue prednisone 40 mg daily until 03/14/15  Acute on chronic renal failure Has ckd stage 3, monitor bmp with pt on lasix  Protein calorie malnutrition Monitor weight, encourage po intake. Continue MVI. Get dietary consult to assess for protein supplement  Anemia of chronic disease Monitor h&h, last hb 10.3  Leukocytosis On chronic steroid which could be contributing some. Monitor cbc with diff  Constipation On senna s 1 tab bid with miralax 17 g daily. Constipated. Increase senna s to 2 tab bid and continue miralax for now and reassess  OAB Continue ditropan 2.5 mg daily and monitor  gerd Continue prilosec 20 mg bid  HTN Continue cozaar 100 mg daily and lasix 20 mg daily , monitor bp readings and bmp  Goals of care: short term rehabilitation   Labs/tests ordered: cbc with diff, bmp in 1 week  Family/ staff Communication: reviewed care plan with patient and nursing  supervisor    Blanchie Serve, MD  [facility] [phone] (Monday-Friday 8 am - 5 pm) [phone] (  afterhours)

## 2015-03-14 LAB — CULTURE, BLOOD (ROUTINE X 2)
Culture: NO GROWTH
Culture: NO GROWTH

## 2015-03-20 LAB — BASIC METABOLIC PANEL
BUN: 39 mg/dL — AB (ref 4–21)
CREATININE: 1.4 mg/dL — AB (ref 0.6–1.3)
GLUCOSE: 72 mg/dL
Potassium: 4.2 mmol/L (ref 3.4–5.3)
Sodium: 144 mmol/L (ref 137–147)

## 2015-03-20 LAB — CBC AND DIFFERENTIAL
HCT: 31 % — AB (ref 41–53)
Hemoglobin: 10 g/dL — AB (ref 13.5–17.5)
Platelets: 180 10*3/uL (ref 150–399)
WBC: 6.9 10*3/mL

## 2015-03-22 ENCOUNTER — Encounter: Payer: Self-pay | Admitting: Internal Medicine

## 2015-03-22 ENCOUNTER — Non-Acute Institutional Stay (SKILLED_NURSING_FACILITY): Payer: Medicare Other | Admitting: Internal Medicine

## 2015-03-22 DIAGNOSIS — I959 Hypotension, unspecified: Secondary | ICD-10-CM | POA: Diagnosis not present

## 2015-03-22 NOTE — Progress Notes (Signed)
Patient ID: Roger [name], male   DOB: 01/17/1920, 81 y.o.   MRN: [id number]    [facility]  Code Status: Full Code   Allergies  Allergen Reactions  . Amlodipine Besylate     REACTION: swelling of feet  . Aspirin     REACTION: GI upset can tolerate ecotrin  . Hydrocodone     GI upset   Chief Complaint  Patient presents with  . Acute Visit    Low blood pressure    HPI:  81 y.o. patient is seen for acute concern. Staff report him to have bp reading of 64/42, 80/42, 88/60. He is seen n his room. He denies any concerns. Denies any headache, dizziness or lightheadedness. Denies chest pain or palpitation. Manual bp reading 148/70 in left arm and 136/80 in right arm. He is here for short term rehabilitation post hospital admission from 03/08/15-03/12/15 with acute hypoxic respiratory failure from bronchial pneumonia and recurrent falls. He has PMH of HLD, HTN, GERD, CKD stage 3 among others.   Review of Systems:  Constitutional: Negative for fever, chills, diaphoresis.  HENT: Negative for headache, congestion, nasal discharge. Hard of hearing. Eyes: Negative for eye pain, blurred vision, double vision and discharge.  Respiratory: Negative for wheezing. Breathing has improved. Positive for cough Cardiovascular: Negative for chest pain, palpitations, leg swelling.  Gastrointestinal: Negative for heartburn, nausea, vomiting, abdominal pain. Genitourinary: Negative for dysuria Musculoskeletal: Negative for back pain, falls in facility Skin: Negative for itching, rash.  Neurological: Negative for dizziness    Past Medical History  Diagnosis Date  . CARCINOMA, SKIN, SQUAMOUS CELL 05/26/2008  . GERD 02/22/2007  . HYPERLIPIDEMIA 02/22/2007  . HYPERTENSION 06/17/2007  . MACULAR DEGENERATION 02/22/2007  . OSTEOARTHRITIS, HIP, RIGHT 06/05/2009  . Ovarian cancer Mother   . Emphysema of lung Brother  . Colon cancer Brother #2       Medication List       This list is  accurate as of: 03/22/15  9:10 AM.  Always use your most recent med list.               atorvastatin 10 MG tablet  Commonly known as:  LIPITOR  Take 10 mg by mouth daily. For High Cholesterol     benzonatate 100 MG capsule  Commonly known as:  TESSALON  Take 1 capsule (100 mg total) by mouth 2 (two) times daily as needed for cough.     cetirizine 10 MG tablet  Commonly known as:  ZYRTEC  TAKE 1 TABLET BY MOUTH EVERY DAY     ECOTRIN 325 MG EC tablet  Generic drug:  aspirin  Take 325 mg by mouth daily.     fluticasone 50 MCG/ACT nasal spray  Commonly known as:  FLONASE  Place 2 sprays into both nostrils daily.     furosemide 20 MG tablet  Commonly known as:  LASIX  Take 1 tablet (20 mg total) by mouth daily.     guaiFENesin 600 MG 12 hr tablet  Commonly known as:  MUCINEX  Take 2 tablets (1,200 mg total) by mouth 2 (two) times daily.     losartan 100 MG tablet  Commonly known as:  COZAAR  TAKE 1 TABLET BY MOUTH EVERY DAY     morphine 15 MG tablet  Commonly known as:  MSIR  Take 0.5 tablets (7.5 mg total) by mouth every 4 (four) hours as needed (cough).     multivitamin with minerals tablet  Take 1  tablet by mouth daily.     omeprazole 20 MG capsule  Commonly known as:  PRILOSEC  Take 1 capsule (20 mg total) by mouth 2 (two) times daily before a meal.     oxybutynin 5 MG tablet  Commonly known as:  DITROPAN  Take 0.5 tablets (2.5 mg total) by mouth daily.     OXYGEN  Inhale 2 L into the lungs.     polyethylene glycol powder powder  Commonly known as:  MIRALAX  Take 17 g by mouth daily.     predniSONE 20 MG tablet  Commonly known as:  DELTASONE  Take 2 tablets (40 mg total) by mouth daily with breakfast.     senna-docusate 8.6-50 MG per tablet  Commonly known as:  Senokot-S  Take 2 tablets by mouth 2 (two) times daily.     UNABLE TO FIND  Med Name: Med pass 120 mL by mouth twice daily for supplement        Physical exam BP 104/69 mmHg  Pulse 83   Temp(Src) 98 F (36.7 C) (Oral)  Resp 16  SpO2 98%   General- elderly male, frail Head- normocephalic, atraumatic Throat- moist mucus membrane Eyes- PERRLA, EOMI, no pallor, no icterus, no discharge Neck- no cervical lymphadenopathy, no jugular vein distension Cardiovascular- normal s1,s2, no murmurs, palpable dorsalis pedis, trace leg edema Respiratory- poor air entry but no wheeze, no crackles, no rhonchi, no use of accessory muscles Abdomen- bowel sounds present, soft, non tender Musculoskeletal- able to move all 4 extremities, generalized weakness Neurological- no focal deficit Skin- warm and dry, senile purpura Psychiatry- normal mood and affect   Assessment/paln  Hypotension bp stable on review at bedside. No symptoms of hypotension reported. Currently on losartan 100 mg daily and lasix 20 mg daily. Will have his bp checked q shift manually for now and have holding parameters on bp medication. If has low bp reading, will need to reduce dosing of cozaar. Check orthostatics  Blanchie Serve, MD  [facility] [phone] (Monday-Friday 8 am - 5 pm) [phone] (afterhours)

## 2015-03-28 ENCOUNTER — Non-Acute Institutional Stay (SKILLED_NURSING_FACILITY): Payer: Medicare Other | Admitting: Internal Medicine

## 2015-03-28 DIAGNOSIS — K219 Gastro-esophageal reflux disease without esophagitis: Secondary | ICD-10-CM

## 2015-03-28 DIAGNOSIS — N183 Chronic kidney disease, stage 3 unspecified: Secondary | ICD-10-CM

## 2015-03-28 DIAGNOSIS — R531 Weakness: Secondary | ICD-10-CM | POA: Diagnosis not present

## 2015-03-28 DIAGNOSIS — J189 Pneumonia, unspecified organism: Secondary | ICD-10-CM | POA: Diagnosis not present

## 2015-03-28 NOTE — Progress Notes (Signed)
Patient ID: Roger [name], male   DOB: 25-Dec-1919, 81 y.o.   MRN: [id number]     Facility: [facility]    PCP: Garret Reddish, MD  Code Status: full code  Allergies  Allergen Reactions  . Amlodipine Besylate     REACTION: swelling of feet  . Aspirin     REACTION: GI upset can tolerate ecotrin  . Hydrocodone     GI upset    Chief Complaint  Patient presents with  . Discharge Note     HPI:  81 y.o. patient is seen today for discharge visit. His breathing has been stable and his o2 saturation has been stable without oxygen. He is here for rehabilitation post hospital admission with acute hypoxic respiratory failure from bronchial pneumonia and recurrent falls. He has PMH of HLD, HTN, GERD, CKD stage 3 among others. He has worked well with therapy team.   Review of Systems:  Constitutional: Negative for fever, chills, diaphoresis.  HENT: Negative for headache, congestion, nasal discharge Eyes: Negative for eye pain, blurred vision, double vision and discharge.  Respiratory: Negative for wheezing. Breathing has improved. Positive for occasional cough Cardiovascular: Negative for chest pain, palpitations, leg swelling.  Gastrointestinal: Negative for heartburn, nausea, vomiting, abdominal pain. Genitourinary: Negative for dysuria Musculoskeletal: Negative for back pain, falls in facility Skin: Negative for itching, rash.  Neurological: Negative for dizziness     Past Medical History  Diagnosis Date  . CARCINOMA, SKIN, SQUAMOUS CELL 05/26/2008  . GERD 02/22/2007  . HYPERLIPIDEMIA 02/22/2007  . HYPERTENSION 06/17/2007  . MACULAR DEGENERATION 02/22/2007  . OSTEOARTHRITIS, HIP, RIGHT 06/05/2009  . Ovarian cancer Mother   . Emphysema of lung Brother  . Colon cancer Brother #2    Medications:   Medication List       This list is accurate as of: 03/28/15  3:25 PM.  Always use your most recent med list.               atorvastatin 10 MG tablet   Commonly known as:  LIPITOR  Take 10 mg by mouth daily. For High Cholesterol     benzonatate 100 MG capsule  Commonly known as:  TESSALON  Take 1 capsule (100 mg total) by mouth 2 (two) times daily as needed for cough.     cetirizine 10 MG tablet  Commonly known as:  ZYRTEC  TAKE 1 TABLET BY MOUTH EVERY DAY     ECOTRIN 325 MG EC tablet  Generic drug:  aspirin  Take 325 mg by mouth daily.     fluticasone 50 MCG/ACT nasal spray  Commonly known as:  FLONASE  Place 2 sprays into both nostrils daily.     furosemide 20 MG tablet  Commonly known as:  LASIX  Take 1 tablet (20 mg total) by mouth daily.     guaiFENesin 600 MG 12 hr tablet  Commonly known as:  MUCINEX  Take 2 tablets (1,200 mg total) by mouth 2 (two) times daily.     losartan 50 MG tablet  Commonly known as:  COZAAR  Take 50 mg by mouth daily.     morphine 15 MG tablet  Commonly known as:  MSIR  Take 0.5 tablets (7.5 mg total) by mouth every 4 (four) hours as needed (cough).     multivitamin with minerals tablet  Take 1 tablet by mouth daily.     omeprazole 20 MG capsule  Commonly known as:  PRILOSEC  Take 1 capsule (20 mg  total) by mouth 2 (two) times daily before a meal.     oxybutynin 5 MG tablet  Commonly known as:  DITROPAN  Take 0.5 tablets (2.5 mg total) by mouth daily.     OXYGEN  Inhale 2 L into the lungs.     polyethylene glycol powder powder  Commonly known as:  MIRALAX  Take 17 g by mouth daily.     predniSONE 20 MG tablet  Commonly known as:  DELTASONE  Take 2 tablets (40 mg total) by mouth daily with breakfast.     senna-docusate 8.6-50 MG per tablet  Commonly known as:  Senokot-S  Take 2 tablets by mouth 2 (two) times daily.     UNABLE TO FIND  Med Name: Med pass 120 mL by mouth twice daily for supplement         Physical Exam: Filed Vitals:   03/28/15 1521  BP: 116/70  Pulse: 71  Temp: 97.5 F (36.4 C)  Resp: 18  SpO2: 95%    General- elderly male, frail,  chronically ill appearing Head- normocephalic, atraumatic Throat- moist mucus membrane Eyes- PERRLA, EOMI, no pallor, no icterus, no discharge Neck- no cervical lymphadenopathy, no jugular vein distension Cardiovascular- normal s1,s2, no murmurs, palpable dorsalis pedis, trace leg edema Respiratory- poor air entry with rhonchi present, no wheeze, no crackles, no use of accessory muscles Abdomen- bowel sounds present, soft, non tender Musculoskeletal- able to move all 4 extremities, generalized weakness Neurological- no focal deficit Skin- warm and dry, senile purpura Psychiatry- normal mood and affect    Labs reviewed: Basic Metabolic Panel:  Recent Labs  03/08/15 2337 03/09/15 0400 03/11/15 0405 03/12/15 0412 03/20/15  NA  --  143 143 144 144  K  --  3.9 4.2 4.5 4.2  CL  --  112* 114* 115*  --   CO2  --  22 21* 23  --   GLUCOSE  --  140* 121* 121*  --   BUN  --  58* 50* 46* 39*  CREATININE  --  1.73* 1.40* 1.32* 1.4*  CALCIUM  --  8.2* 8.1* 8.2*  --   MG 2.4  --   --   --   --    Liver Function Tests:  Recent Labs  03/02/15 1520 03/08/15 1727 03/09/15 0400  AST 24 38 27  ALT 15 37 37  ALKPHOS 72 62 63  BILITOT 0.6 0.9 1.0  PROT 7.6 6.1* 6.2*  ALBUMIN 3.9 2.7* 2.7*   No results for input(s): LIPASE, AMYLASE in the last 8760 hours. No results for input(s): AMMONIA in the last 8760 hours. CBC:  Recent Labs  03/02/15 1520 03/08/15 1727 03/09/15 0400 03/11/15 0405 03/20/15  WBC 8.6 9.0 7.8 13.0* 6.9  NEUTROABS 7.7 7.2  --   --   --   HGB 12.1* 11.0* 10.7* 10.3* 10.0*  HCT 36.4* 33.6* 32.7* 32.0* 31*  MCV 94.0 94.6 94.8 96.4  --   PLT 156.0 179 156 206 180   03/28/15 wbc 5.5, hb 9, hct 27.5, bun 28, cr 1.32, ca 8.1, na 142, k 3.9   Assessment/Plan  He is stable for discharge with home health services for PT/OT and home health aid. He will need DME : wheelchair with cushion and 3-1 commode. Will have him work with physical therapy and occupational therapy  team to help with gait training and muscle strengthening exercises.fall precautions. Skin care. On prn morphine with mucinex for cough. Completed course of prednisone. Improved renal function  on recheck of labs. Has slight drop in hb from hospital, will need his cbc monitored with his PCP office. Scripts for his medication provided. Follow up appointment with his PCP to be set up, informed social work about this.   Blanchie Serve, MD  [facility] [phone] (Monday-Friday 8 am - 5 pm) [phone] (afterhours)

## 2015-04-05 ENCOUNTER — Telehealth: Payer: Self-pay | Admitting: Family Medicine

## 2015-04-05 DIAGNOSIS — Z8701 Personal history of pneumonia (recurrent): Secondary | ICD-10-CM | POA: Diagnosis not present

## 2015-04-05 DIAGNOSIS — M6281 Muscle weakness (generalized): Secondary | ICD-10-CM | POA: Diagnosis not present

## 2015-04-05 DIAGNOSIS — I129 Hypertensive chronic kidney disease with stage 1 through stage 4 chronic kidney disease, or unspecified chronic kidney disease: Secondary | ICD-10-CM | POA: Diagnosis not present

## 2015-04-05 DIAGNOSIS — K5900 Constipation, unspecified: Secondary | ICD-10-CM | POA: Diagnosis not present

## 2015-04-05 DIAGNOSIS — I5030 Unspecified diastolic (congestive) heart failure: Secondary | ICD-10-CM | POA: Diagnosis not present

## 2015-04-05 DIAGNOSIS — H3530 Unspecified macular degeneration: Secondary | ICD-10-CM | POA: Diagnosis not present

## 2015-04-05 DIAGNOSIS — R5381 Other malaise: Secondary | ICD-10-CM | POA: Diagnosis not present

## 2015-04-05 DIAGNOSIS — E46 Unspecified protein-calorie malnutrition: Secondary | ICD-10-CM | POA: Diagnosis not present

## 2015-04-05 DIAGNOSIS — I952 Hypotension due to drugs: Secondary | ICD-10-CM | POA: Diagnosis not present

## 2015-04-05 DIAGNOSIS — M1611 Unilateral primary osteoarthritis, right hip: Secondary | ICD-10-CM | POA: Diagnosis not present

## 2015-04-05 DIAGNOSIS — N3281 Overactive bladder: Secondary | ICD-10-CM | POA: Diagnosis not present

## 2015-04-05 DIAGNOSIS — N183 Chronic kidney disease, stage 3 (moderate): Secondary | ICD-10-CM | POA: Diagnosis not present

## 2015-04-05 DIAGNOSIS — R269 Unspecified abnormalities of gait and mobility: Secondary | ICD-10-CM | POA: Diagnosis not present

## 2015-04-05 NOTE — Telephone Encounter (Signed)
[facility] received orders today for pt to continue his skilled nursing, OT, PT, and home health aid from [facility]. They need verbal it is ok to continue to provide care.  Skilled nursing will manage his pain, mediation and disease process

## 2015-04-05 NOTE — Telephone Encounter (Signed)
Ok to give verbal 

## 2015-04-06 DIAGNOSIS — I5030 Unspecified diastolic (congestive) heart failure: Secondary | ICD-10-CM | POA: Diagnosis not present

## 2015-04-06 DIAGNOSIS — N3281 Overactive bladder: Secondary | ICD-10-CM | POA: Diagnosis not present

## 2015-04-06 DIAGNOSIS — I952 Hypotension due to drugs: Secondary | ICD-10-CM | POA: Diagnosis not present

## 2015-04-06 DIAGNOSIS — M1611 Unilateral primary osteoarthritis, right hip: Secondary | ICD-10-CM | POA: Diagnosis not present

## 2015-04-06 DIAGNOSIS — Z8701 Personal history of pneumonia (recurrent): Secondary | ICD-10-CM | POA: Diagnosis not present

## 2015-04-06 DIAGNOSIS — H3530 Unspecified macular degeneration: Secondary | ICD-10-CM | POA: Diagnosis not present

## 2015-04-06 DIAGNOSIS — R5381 Other malaise: Secondary | ICD-10-CM | POA: Diagnosis not present

## 2015-04-06 DIAGNOSIS — E46 Unspecified protein-calorie malnutrition: Secondary | ICD-10-CM | POA: Diagnosis not present

## 2015-04-06 DIAGNOSIS — M6281 Muscle weakness (generalized): Secondary | ICD-10-CM | POA: Diagnosis not present

## 2015-04-06 DIAGNOSIS — I129 Hypertensive chronic kidney disease with stage 1 through stage 4 chronic kidney disease, or unspecified chronic kidney disease: Secondary | ICD-10-CM | POA: Diagnosis not present

## 2015-04-06 DIAGNOSIS — N183 Chronic kidney disease, stage 3 (moderate): Secondary | ICD-10-CM | POA: Diagnosis not present

## 2015-04-06 DIAGNOSIS — R269 Unspecified abnormalities of gait and mobility: Secondary | ICD-10-CM | POA: Diagnosis not present

## 2015-04-06 DIAGNOSIS — K5900 Constipation, unspecified: Secondary | ICD-10-CM | POA: Diagnosis not present

## 2015-04-09 NOTE — Telephone Encounter (Signed)
As long as he does not need face to face- can give verbal

## 2015-04-09 NOTE — Telephone Encounter (Signed)
Burnett Sheng needs verbal order for nursing. Pt is sch for home health visit tomorrow [phone]

## 2015-04-09 NOTE — Telephone Encounter (Signed)
Arbie Cookey notified and verbal given.

## 2015-04-09 NOTE — Telephone Encounter (Signed)
Joann w/ [facility] is the physical therapist. [phone] )  She did eval last week on pt. and now needs verbal for PT . Verbal order for one time for the eval last week And then 2 X week for 8 weeks.

## 2015-04-09 NOTE — Telephone Encounter (Signed)
Called and lm on Joann vm tcb.

## 2015-04-10 DIAGNOSIS — I952 Hypotension due to drugs: Secondary | ICD-10-CM | POA: Diagnosis not present

## 2015-04-10 DIAGNOSIS — R5381 Other malaise: Secondary | ICD-10-CM | POA: Diagnosis not present

## 2015-04-10 DIAGNOSIS — N3281 Overactive bladder: Secondary | ICD-10-CM | POA: Diagnosis not present

## 2015-04-10 DIAGNOSIS — I129 Hypertensive chronic kidney disease with stage 1 through stage 4 chronic kidney disease, or unspecified chronic kidney disease: Secondary | ICD-10-CM | POA: Diagnosis not present

## 2015-04-10 DIAGNOSIS — N183 Chronic kidney disease, stage 3 (moderate): Secondary | ICD-10-CM | POA: Diagnosis not present

## 2015-04-10 DIAGNOSIS — M1611 Unilateral primary osteoarthritis, right hip: Secondary | ICD-10-CM | POA: Diagnosis not present

## 2015-04-10 DIAGNOSIS — R269 Unspecified abnormalities of gait and mobility: Secondary | ICD-10-CM | POA: Diagnosis not present

## 2015-04-10 DIAGNOSIS — Z8701 Personal history of pneumonia (recurrent): Secondary | ICD-10-CM | POA: Diagnosis not present

## 2015-04-10 DIAGNOSIS — M6281 Muscle weakness (generalized): Secondary | ICD-10-CM | POA: Diagnosis not present

## 2015-04-10 DIAGNOSIS — I5030 Unspecified diastolic (congestive) heart failure: Secondary | ICD-10-CM | POA: Diagnosis not present

## 2015-04-10 DIAGNOSIS — H3530 Unspecified macular degeneration: Secondary | ICD-10-CM | POA: Diagnosis not present

## 2015-04-10 DIAGNOSIS — E46 Unspecified protein-calorie malnutrition: Secondary | ICD-10-CM | POA: Diagnosis not present

## 2015-04-10 DIAGNOSIS — K5900 Constipation, unspecified: Secondary | ICD-10-CM | POA: Diagnosis not present

## 2015-04-12 ENCOUNTER — Telehealth: Payer: Self-pay | Admitting: Family Medicine

## 2015-04-12 DIAGNOSIS — H3530 Unspecified macular degeneration: Secondary | ICD-10-CM | POA: Diagnosis not present

## 2015-04-12 DIAGNOSIS — R5381 Other malaise: Secondary | ICD-10-CM | POA: Diagnosis not present

## 2015-04-12 DIAGNOSIS — Z8701 Personal history of pneumonia (recurrent): Secondary | ICD-10-CM | POA: Diagnosis not present

## 2015-04-12 DIAGNOSIS — E46 Unspecified protein-calorie malnutrition: Secondary | ICD-10-CM | POA: Diagnosis not present

## 2015-04-12 DIAGNOSIS — K5900 Constipation, unspecified: Secondary | ICD-10-CM | POA: Diagnosis not present

## 2015-04-12 DIAGNOSIS — I5030 Unspecified diastolic (congestive) heart failure: Secondary | ICD-10-CM | POA: Diagnosis not present

## 2015-04-12 DIAGNOSIS — I129 Hypertensive chronic kidney disease with stage 1 through stage 4 chronic kidney disease, or unspecified chronic kidney disease: Secondary | ICD-10-CM | POA: Diagnosis not present

## 2015-04-12 DIAGNOSIS — M1611 Unilateral primary osteoarthritis, right hip: Secondary | ICD-10-CM | POA: Diagnosis not present

## 2015-04-12 DIAGNOSIS — N3281 Overactive bladder: Secondary | ICD-10-CM | POA: Diagnosis not present

## 2015-04-12 DIAGNOSIS — N183 Chronic kidney disease, stage 3 (moderate): Secondary | ICD-10-CM | POA: Diagnosis not present

## 2015-04-12 DIAGNOSIS — R269 Unspecified abnormalities of gait and mobility: Secondary | ICD-10-CM | POA: Diagnosis not present

## 2015-04-12 DIAGNOSIS — M6281 Muscle weakness (generalized): Secondary | ICD-10-CM | POA: Diagnosis not present

## 2015-04-12 DIAGNOSIS — I952 Hypotension due to drugs: Secondary | ICD-10-CM | POA: Diagnosis not present

## 2015-04-12 MED ORDER — LIDOCAINE 5 % EX PTCH: 1.0000 | MEDICATED_PATCH | CUTANEOUS | Status: DC

## 2015-04-12 NOTE — Telephone Encounter (Signed)
I have sent this in. Happy to see patient if needed. Seems like new issue and I can see him.

## 2015-04-12 NOTE — Telephone Encounter (Signed)
See below

## 2015-04-12 NOTE — Telephone Encounter (Signed)
Joanne physical therapist from [facility]  is calling to report pt has severe hip and knee pain. Pt is unable to tolerated any pain med except tylenol. Mechele Claude would like to know if md would prescribe lidocaine patch. Pt pain was 8 out of 10 last night.[facility] [place]

## 2015-04-12 NOTE — Telephone Encounter (Signed)
Joanne notified.

## 2015-04-13 ENCOUNTER — Telehealth: Payer: Self-pay | Admitting: Family Medicine

## 2015-04-13 NOTE — Telephone Encounter (Signed)
Roger [name] needs verbal orders asap for  OT    1/wk 1   followed by 2/week 7

## 2015-04-13 NOTE — Telephone Encounter (Signed)
Malory called back and notified

## 2015-04-16 DIAGNOSIS — I952 Hypotension due to drugs: Secondary | ICD-10-CM | POA: Diagnosis not present

## 2015-04-16 DIAGNOSIS — N183 Chronic kidney disease, stage 3 (moderate): Secondary | ICD-10-CM | POA: Diagnosis not present

## 2015-04-16 DIAGNOSIS — M1611 Unilateral primary osteoarthritis, right hip: Secondary | ICD-10-CM | POA: Diagnosis not present

## 2015-04-16 DIAGNOSIS — I129 Hypertensive chronic kidney disease with stage 1 through stage 4 chronic kidney disease, or unspecified chronic kidney disease: Secondary | ICD-10-CM | POA: Diagnosis not present

## 2015-04-16 DIAGNOSIS — N3281 Overactive bladder: Secondary | ICD-10-CM | POA: Diagnosis not present

## 2015-04-16 DIAGNOSIS — Z8701 Personal history of pneumonia (recurrent): Secondary | ICD-10-CM | POA: Diagnosis not present

## 2015-04-16 DIAGNOSIS — I5030 Unspecified diastolic (congestive) heart failure: Secondary | ICD-10-CM | POA: Diagnosis not present

## 2015-04-16 DIAGNOSIS — R5381 Other malaise: Secondary | ICD-10-CM | POA: Diagnosis not present

## 2015-04-16 DIAGNOSIS — R269 Unspecified abnormalities of gait and mobility: Secondary | ICD-10-CM | POA: Diagnosis not present

## 2015-04-16 DIAGNOSIS — E46 Unspecified protein-calorie malnutrition: Secondary | ICD-10-CM | POA: Diagnosis not present

## 2015-04-16 DIAGNOSIS — M6281 Muscle weakness (generalized): Secondary | ICD-10-CM | POA: Diagnosis not present

## 2015-04-16 DIAGNOSIS — H3530 Unspecified macular degeneration: Secondary | ICD-10-CM | POA: Diagnosis not present

## 2015-04-16 DIAGNOSIS — K5900 Constipation, unspecified: Secondary | ICD-10-CM | POA: Diagnosis not present

## 2015-04-17 DIAGNOSIS — N3281 Overactive bladder: Secondary | ICD-10-CM | POA: Diagnosis not present

## 2015-04-17 DIAGNOSIS — R269 Unspecified abnormalities of gait and mobility: Secondary | ICD-10-CM | POA: Diagnosis not present

## 2015-04-17 DIAGNOSIS — K5900 Constipation, unspecified: Secondary | ICD-10-CM | POA: Diagnosis not present

## 2015-04-17 DIAGNOSIS — R5381 Other malaise: Secondary | ICD-10-CM | POA: Diagnosis not present

## 2015-04-17 DIAGNOSIS — M1611 Unilateral primary osteoarthritis, right hip: Secondary | ICD-10-CM | POA: Diagnosis not present

## 2015-04-17 DIAGNOSIS — H3530 Unspecified macular degeneration: Secondary | ICD-10-CM | POA: Diagnosis not present

## 2015-04-17 DIAGNOSIS — Z8701 Personal history of pneumonia (recurrent): Secondary | ICD-10-CM | POA: Diagnosis not present

## 2015-04-17 DIAGNOSIS — E46 Unspecified protein-calorie malnutrition: Secondary | ICD-10-CM | POA: Diagnosis not present

## 2015-04-17 DIAGNOSIS — M6281 Muscle weakness (generalized): Secondary | ICD-10-CM | POA: Diagnosis not present

## 2015-04-17 DIAGNOSIS — I129 Hypertensive chronic kidney disease with stage 1 through stage 4 chronic kidney disease, or unspecified chronic kidney disease: Secondary | ICD-10-CM | POA: Diagnosis not present

## 2015-04-17 DIAGNOSIS — I952 Hypotension due to drugs: Secondary | ICD-10-CM | POA: Diagnosis not present

## 2015-04-17 DIAGNOSIS — N183 Chronic kidney disease, stage 3 (moderate): Secondary | ICD-10-CM | POA: Diagnosis not present

## 2015-04-17 DIAGNOSIS — I5030 Unspecified diastolic (congestive) heart failure: Secondary | ICD-10-CM | POA: Diagnosis not present

## 2015-04-18 DIAGNOSIS — H3530 Unspecified macular degeneration: Secondary | ICD-10-CM | POA: Diagnosis not present

## 2015-04-18 DIAGNOSIS — I5030 Unspecified diastolic (congestive) heart failure: Secondary | ICD-10-CM | POA: Diagnosis not present

## 2015-04-18 DIAGNOSIS — N3281 Overactive bladder: Secondary | ICD-10-CM | POA: Diagnosis not present

## 2015-04-18 DIAGNOSIS — I129 Hypertensive chronic kidney disease with stage 1 through stage 4 chronic kidney disease, or unspecified chronic kidney disease: Secondary | ICD-10-CM | POA: Diagnosis not present

## 2015-04-18 DIAGNOSIS — R5381 Other malaise: Secondary | ICD-10-CM | POA: Diagnosis not present

## 2015-04-18 DIAGNOSIS — Z8701 Personal history of pneumonia (recurrent): Secondary | ICD-10-CM | POA: Diagnosis not present

## 2015-04-18 DIAGNOSIS — E46 Unspecified protein-calorie malnutrition: Secondary | ICD-10-CM | POA: Diagnosis not present

## 2015-04-18 DIAGNOSIS — N183 Chronic kidney disease, stage 3 (moderate): Secondary | ICD-10-CM | POA: Diagnosis not present

## 2015-04-18 DIAGNOSIS — R269 Unspecified abnormalities of gait and mobility: Secondary | ICD-10-CM | POA: Diagnosis not present

## 2015-04-18 DIAGNOSIS — M6281 Muscle weakness (generalized): Secondary | ICD-10-CM | POA: Diagnosis not present

## 2015-04-18 DIAGNOSIS — M1611 Unilateral primary osteoarthritis, right hip: Secondary | ICD-10-CM | POA: Diagnosis not present

## 2015-04-18 DIAGNOSIS — I952 Hypotension due to drugs: Secondary | ICD-10-CM | POA: Diagnosis not present

## 2015-04-18 DIAGNOSIS — K5900 Constipation, unspecified: Secondary | ICD-10-CM | POA: Diagnosis not present

## 2015-04-19 DIAGNOSIS — N183 Chronic kidney disease, stage 3 (moderate): Secondary | ICD-10-CM | POA: Diagnosis not present

## 2015-04-19 DIAGNOSIS — M1611 Unilateral primary osteoarthritis, right hip: Secondary | ICD-10-CM | POA: Diagnosis not present

## 2015-04-19 DIAGNOSIS — N3281 Overactive bladder: Secondary | ICD-10-CM | POA: Diagnosis not present

## 2015-04-19 DIAGNOSIS — Z8701 Personal history of pneumonia (recurrent): Secondary | ICD-10-CM | POA: Diagnosis not present

## 2015-04-19 DIAGNOSIS — I952 Hypotension due to drugs: Secondary | ICD-10-CM | POA: Diagnosis not present

## 2015-04-19 DIAGNOSIS — I129 Hypertensive chronic kidney disease with stage 1 through stage 4 chronic kidney disease, or unspecified chronic kidney disease: Secondary | ICD-10-CM | POA: Diagnosis not present

## 2015-04-19 DIAGNOSIS — R5381 Other malaise: Secondary | ICD-10-CM | POA: Diagnosis not present

## 2015-04-19 DIAGNOSIS — H3530 Unspecified macular degeneration: Secondary | ICD-10-CM | POA: Diagnosis not present

## 2015-04-19 DIAGNOSIS — R269 Unspecified abnormalities of gait and mobility: Secondary | ICD-10-CM | POA: Diagnosis not present

## 2015-04-19 DIAGNOSIS — I5030 Unspecified diastolic (congestive) heart failure: Secondary | ICD-10-CM | POA: Diagnosis not present

## 2015-04-19 DIAGNOSIS — K5900 Constipation, unspecified: Secondary | ICD-10-CM | POA: Diagnosis not present

## 2015-04-19 DIAGNOSIS — M6281 Muscle weakness (generalized): Secondary | ICD-10-CM | POA: Diagnosis not present

## 2015-04-19 DIAGNOSIS — E46 Unspecified protein-calorie malnutrition: Secondary | ICD-10-CM | POA: Diagnosis not present

## 2015-04-20 DIAGNOSIS — M6281 Muscle weakness (generalized): Secondary | ICD-10-CM | POA: Diagnosis not present

## 2015-04-20 DIAGNOSIS — M1611 Unilateral primary osteoarthritis, right hip: Secondary | ICD-10-CM | POA: Diagnosis not present

## 2015-04-20 DIAGNOSIS — I129 Hypertensive chronic kidney disease with stage 1 through stage 4 chronic kidney disease, or unspecified chronic kidney disease: Secondary | ICD-10-CM | POA: Diagnosis not present

## 2015-04-20 DIAGNOSIS — R5381 Other malaise: Secondary | ICD-10-CM | POA: Diagnosis not present

## 2015-04-20 DIAGNOSIS — K5900 Constipation, unspecified: Secondary | ICD-10-CM | POA: Diagnosis not present

## 2015-04-20 DIAGNOSIS — E46 Unspecified protein-calorie malnutrition: Secondary | ICD-10-CM | POA: Diagnosis not present

## 2015-04-20 DIAGNOSIS — N183 Chronic kidney disease, stage 3 (moderate): Secondary | ICD-10-CM | POA: Diagnosis not present

## 2015-04-20 DIAGNOSIS — I952 Hypotension due to drugs: Secondary | ICD-10-CM | POA: Diagnosis not present

## 2015-04-20 DIAGNOSIS — H3530 Unspecified macular degeneration: Secondary | ICD-10-CM | POA: Diagnosis not present

## 2015-04-20 DIAGNOSIS — Z8701 Personal history of pneumonia (recurrent): Secondary | ICD-10-CM | POA: Diagnosis not present

## 2015-04-20 DIAGNOSIS — I5030 Unspecified diastolic (congestive) heart failure: Secondary | ICD-10-CM | POA: Diagnosis not present

## 2015-04-20 DIAGNOSIS — N3281 Overactive bladder: Secondary | ICD-10-CM | POA: Diagnosis not present

## 2015-04-20 DIAGNOSIS — R269 Unspecified abnormalities of gait and mobility: Secondary | ICD-10-CM | POA: Diagnosis not present

## 2015-04-22 ENCOUNTER — Other Ambulatory Visit: Payer: Self-pay | Admitting: Family Medicine

## 2015-04-23 ENCOUNTER — Telehealth: Payer: Self-pay | Admitting: Family Medicine

## 2015-04-23 DIAGNOSIS — M6281 Muscle weakness (generalized): Secondary | ICD-10-CM | POA: Diagnosis not present

## 2015-04-23 DIAGNOSIS — E46 Unspecified protein-calorie malnutrition: Secondary | ICD-10-CM | POA: Diagnosis not present

## 2015-04-23 DIAGNOSIS — I129 Hypertensive chronic kidney disease with stage 1 through stage 4 chronic kidney disease, or unspecified chronic kidney disease: Secondary | ICD-10-CM | POA: Diagnosis not present

## 2015-04-23 DIAGNOSIS — N183 Chronic kidney disease, stage 3 (moderate): Secondary | ICD-10-CM | POA: Diagnosis not present

## 2015-04-23 DIAGNOSIS — I5030 Unspecified diastolic (congestive) heart failure: Secondary | ICD-10-CM | POA: Diagnosis not present

## 2015-04-23 DIAGNOSIS — R269 Unspecified abnormalities of gait and mobility: Secondary | ICD-10-CM | POA: Diagnosis not present

## 2015-04-23 DIAGNOSIS — K5900 Constipation, unspecified: Secondary | ICD-10-CM | POA: Diagnosis not present

## 2015-04-23 DIAGNOSIS — Z8701 Personal history of pneumonia (recurrent): Secondary | ICD-10-CM | POA: Diagnosis not present

## 2015-04-23 DIAGNOSIS — H3530 Unspecified macular degeneration: Secondary | ICD-10-CM | POA: Diagnosis not present

## 2015-04-23 DIAGNOSIS — I952 Hypotension due to drugs: Secondary | ICD-10-CM | POA: Diagnosis not present

## 2015-04-23 DIAGNOSIS — N3281 Overactive bladder: Secondary | ICD-10-CM | POA: Diagnosis not present

## 2015-04-23 DIAGNOSIS — M1611 Unilateral primary osteoarthritis, right hip: Secondary | ICD-10-CM | POA: Diagnosis not present

## 2015-04-23 DIAGNOSIS — R5381 Other malaise: Secondary | ICD-10-CM | POA: Diagnosis not present

## 2015-04-23 NOTE — Telephone Encounter (Signed)
Roger [name] from [place] [facility] call and would like a call back. She need orders for a stage 2 pressure ulcer on his right heel    [phone]

## 2015-04-23 NOTE — Telephone Encounter (Signed)
[facility] therapist is calling to report  pt resting bp 90/60 and after exercising is 120/68 today.

## 2015-04-23 NOTE — Telephone Encounter (Signed)
FYI

## 2015-04-23 NOTE — Telephone Encounter (Signed)
Called back and gave Arbie Cookey verbal to dress pressure ulcer.

## 2015-04-23 NOTE — Telephone Encounter (Signed)
Thanks [employer]

## 2015-04-24 ENCOUNTER — Telehealth: Payer: Self-pay | Admitting: Family Medicine

## 2015-04-24 DIAGNOSIS — M6281 Muscle weakness (generalized): Secondary | ICD-10-CM | POA: Diagnosis not present

## 2015-04-24 DIAGNOSIS — H3530 Unspecified macular degeneration: Secondary | ICD-10-CM | POA: Diagnosis not present

## 2015-04-24 DIAGNOSIS — N3281 Overactive bladder: Secondary | ICD-10-CM | POA: Diagnosis not present

## 2015-04-24 DIAGNOSIS — I5030 Unspecified diastolic (congestive) heart failure: Secondary | ICD-10-CM | POA: Diagnosis not present

## 2015-04-24 DIAGNOSIS — Z8701 Personal history of pneumonia (recurrent): Secondary | ICD-10-CM | POA: Diagnosis not present

## 2015-04-24 DIAGNOSIS — R269 Unspecified abnormalities of gait and mobility: Secondary | ICD-10-CM | POA: Diagnosis not present

## 2015-04-24 DIAGNOSIS — I952 Hypotension due to drugs: Secondary | ICD-10-CM | POA: Diagnosis not present

## 2015-04-24 DIAGNOSIS — K5900 Constipation, unspecified: Secondary | ICD-10-CM | POA: Diagnosis not present

## 2015-04-24 DIAGNOSIS — R5381 Other malaise: Secondary | ICD-10-CM | POA: Diagnosis not present

## 2015-04-24 DIAGNOSIS — I129 Hypertensive chronic kidney disease with stage 1 through stage 4 chronic kidney disease, or unspecified chronic kidney disease: Secondary | ICD-10-CM | POA: Diagnosis not present

## 2015-04-24 DIAGNOSIS — E46 Unspecified protein-calorie malnutrition: Secondary | ICD-10-CM | POA: Diagnosis not present

## 2015-04-24 DIAGNOSIS — N183 Chronic kidney disease, stage 3 (moderate): Secondary | ICD-10-CM | POA: Diagnosis not present

## 2015-04-24 DIAGNOSIS — M1611 Unilateral primary osteoarthritis, right hip: Secondary | ICD-10-CM | POA: Diagnosis not present

## 2015-04-24 NOTE — Telephone Encounter (Signed)
Pt was discharge from [facility] on 03-28-15 and needs post rehab appt. Can I create 30 min slot?

## 2015-04-24 NOTE — Telephone Encounter (Signed)
See below

## 2015-04-25 DIAGNOSIS — K5900 Constipation, unspecified: Secondary | ICD-10-CM | POA: Diagnosis not present

## 2015-04-25 DIAGNOSIS — I952 Hypotension due to drugs: Secondary | ICD-10-CM | POA: Diagnosis not present

## 2015-04-25 DIAGNOSIS — R269 Unspecified abnormalities of gait and mobility: Secondary | ICD-10-CM | POA: Diagnosis not present

## 2015-04-25 DIAGNOSIS — M6281 Muscle weakness (generalized): Secondary | ICD-10-CM | POA: Diagnosis not present

## 2015-04-25 DIAGNOSIS — N3281 Overactive bladder: Secondary | ICD-10-CM | POA: Diagnosis not present

## 2015-04-25 DIAGNOSIS — Z8701 Personal history of pneumonia (recurrent): Secondary | ICD-10-CM | POA: Diagnosis not present

## 2015-04-25 DIAGNOSIS — R5381 Other malaise: Secondary | ICD-10-CM | POA: Diagnosis not present

## 2015-04-25 DIAGNOSIS — H3530 Unspecified macular degeneration: Secondary | ICD-10-CM | POA: Diagnosis not present

## 2015-04-25 DIAGNOSIS — I129 Hypertensive chronic kidney disease with stage 1 through stage 4 chronic kidney disease, or unspecified chronic kidney disease: Secondary | ICD-10-CM | POA: Diagnosis not present

## 2015-04-25 DIAGNOSIS — I5030 Unspecified diastolic (congestive) heart failure: Secondary | ICD-10-CM | POA: Diagnosis not present

## 2015-04-25 DIAGNOSIS — N183 Chronic kidney disease, stage 3 (moderate): Secondary | ICD-10-CM | POA: Diagnosis not present

## 2015-04-25 DIAGNOSIS — M1611 Unilateral primary osteoarthritis, right hip: Secondary | ICD-10-CM | POA: Diagnosis not present

## 2015-04-25 DIAGNOSIS — E46 Unspecified protein-calorie malnutrition: Secondary | ICD-10-CM | POA: Diagnosis not present

## 2015-04-25 NOTE — Telephone Encounter (Signed)
Yes thanks. Needs to have discharge summary from [facility] physician sent here as well before visit

## 2015-04-25 NOTE — Telephone Encounter (Signed)
See below

## 2015-04-25 NOTE — Telephone Encounter (Signed)
Pt said he was at [facility]

## 2015-04-26 ENCOUNTER — Telehealth: Payer: Self-pay | Admitting: Family Medicine

## 2015-04-26 DIAGNOSIS — K5900 Constipation, unspecified: Secondary | ICD-10-CM | POA: Diagnosis not present

## 2015-04-26 DIAGNOSIS — E46 Unspecified protein-calorie malnutrition: Secondary | ICD-10-CM | POA: Diagnosis not present

## 2015-04-26 DIAGNOSIS — R5381 Other malaise: Secondary | ICD-10-CM | POA: Diagnosis not present

## 2015-04-26 DIAGNOSIS — Z8701 Personal history of pneumonia (recurrent): Secondary | ICD-10-CM | POA: Diagnosis not present

## 2015-04-26 DIAGNOSIS — I129 Hypertensive chronic kidney disease with stage 1 through stage 4 chronic kidney disease, or unspecified chronic kidney disease: Secondary | ICD-10-CM | POA: Diagnosis not present

## 2015-04-26 DIAGNOSIS — R269 Unspecified abnormalities of gait and mobility: Secondary | ICD-10-CM | POA: Diagnosis not present

## 2015-04-26 DIAGNOSIS — I5030 Unspecified diastolic (congestive) heart failure: Secondary | ICD-10-CM | POA: Diagnosis not present

## 2015-04-26 DIAGNOSIS — I952 Hypotension due to drugs: Secondary | ICD-10-CM | POA: Diagnosis not present

## 2015-04-26 DIAGNOSIS — M6281 Muscle weakness (generalized): Secondary | ICD-10-CM | POA: Diagnosis not present

## 2015-04-26 DIAGNOSIS — N3281 Overactive bladder: Secondary | ICD-10-CM | POA: Diagnosis not present

## 2015-04-26 DIAGNOSIS — H3530 Unspecified macular degeneration: Secondary | ICD-10-CM | POA: Diagnosis not present

## 2015-04-26 DIAGNOSIS — M1611 Unilateral primary osteoarthritis, right hip: Secondary | ICD-10-CM | POA: Diagnosis not present

## 2015-04-26 DIAGNOSIS — N183 Chronic kidney disease, stage 3 (moderate): Secondary | ICD-10-CM | POA: Diagnosis not present

## 2015-04-26 NOTE — Telephone Encounter (Signed)
Roger [name] from [facility] call to report  bp was low 90/52 non symptomatic

## 2015-04-26 NOTE — Telephone Encounter (Signed)
I called [facility] and they do not have/had a pt by that name at their facility.

## 2015-04-26 NOTE — Telephone Encounter (Signed)
Lm on joan zinser vm pt daughter

## 2015-04-26 NOTE — Telephone Encounter (Signed)
FYI

## 2015-04-27 DIAGNOSIS — M1611 Unilateral primary osteoarthritis, right hip: Secondary | ICD-10-CM | POA: Diagnosis not present

## 2015-04-27 DIAGNOSIS — I952 Hypotension due to drugs: Secondary | ICD-10-CM | POA: Diagnosis not present

## 2015-04-27 DIAGNOSIS — M6281 Muscle weakness (generalized): Secondary | ICD-10-CM | POA: Diagnosis not present

## 2015-04-27 DIAGNOSIS — N183 Chronic kidney disease, stage 3 (moderate): Secondary | ICD-10-CM | POA: Diagnosis not present

## 2015-04-27 DIAGNOSIS — N3281 Overactive bladder: Secondary | ICD-10-CM | POA: Diagnosis not present

## 2015-04-27 DIAGNOSIS — R5381 Other malaise: Secondary | ICD-10-CM | POA: Diagnosis not present

## 2015-04-27 DIAGNOSIS — H3530 Unspecified macular degeneration: Secondary | ICD-10-CM | POA: Diagnosis not present

## 2015-04-27 DIAGNOSIS — Z8701 Personal history of pneumonia (recurrent): Secondary | ICD-10-CM | POA: Diagnosis not present

## 2015-04-27 DIAGNOSIS — R269 Unspecified abnormalities of gait and mobility: Secondary | ICD-10-CM | POA: Diagnosis not present

## 2015-04-27 DIAGNOSIS — K5900 Constipation, unspecified: Secondary | ICD-10-CM | POA: Diagnosis not present

## 2015-04-27 DIAGNOSIS — E46 Unspecified protein-calorie malnutrition: Secondary | ICD-10-CM | POA: Diagnosis not present

## 2015-04-27 DIAGNOSIS — I5030 Unspecified diastolic (congestive) heart failure: Secondary | ICD-10-CM | POA: Diagnosis not present

## 2015-04-27 DIAGNOSIS — I129 Hypertensive chronic kidney disease with stage 1 through stage 4 chronic kidney disease, or unspecified chronic kidney disease: Secondary | ICD-10-CM | POA: Diagnosis not present

## 2015-05-01 DIAGNOSIS — K5900 Constipation, unspecified: Secondary | ICD-10-CM | POA: Diagnosis not present

## 2015-05-01 DIAGNOSIS — M6281 Muscle weakness (generalized): Secondary | ICD-10-CM | POA: Diagnosis not present

## 2015-05-01 DIAGNOSIS — Z8701 Personal history of pneumonia (recurrent): Secondary | ICD-10-CM | POA: Diagnosis not present

## 2015-05-01 DIAGNOSIS — M1611 Unilateral primary osteoarthritis, right hip: Secondary | ICD-10-CM | POA: Diagnosis not present

## 2015-05-01 DIAGNOSIS — H3530 Unspecified macular degeneration: Secondary | ICD-10-CM | POA: Diagnosis not present

## 2015-05-01 DIAGNOSIS — N3281 Overactive bladder: Secondary | ICD-10-CM | POA: Diagnosis not present

## 2015-05-01 DIAGNOSIS — I129 Hypertensive chronic kidney disease with stage 1 through stage 4 chronic kidney disease, or unspecified chronic kidney disease: Secondary | ICD-10-CM | POA: Diagnosis not present

## 2015-05-01 DIAGNOSIS — N183 Chronic kidney disease, stage 3 (moderate): Secondary | ICD-10-CM | POA: Diagnosis not present

## 2015-05-01 DIAGNOSIS — R5381 Other malaise: Secondary | ICD-10-CM | POA: Diagnosis not present

## 2015-05-01 DIAGNOSIS — I5030 Unspecified diastolic (congestive) heart failure: Secondary | ICD-10-CM | POA: Diagnosis not present

## 2015-05-01 DIAGNOSIS — E46 Unspecified protein-calorie malnutrition: Secondary | ICD-10-CM | POA: Diagnosis not present

## 2015-05-01 DIAGNOSIS — I952 Hypotension due to drugs: Secondary | ICD-10-CM | POA: Diagnosis not present

## 2015-05-01 DIAGNOSIS — R269 Unspecified abnormalities of gait and mobility: Secondary | ICD-10-CM | POA: Diagnosis not present

## 2015-05-01 NOTE — Telephone Encounter (Signed)
Sorry thought i had sent this to you the other day.

## 2015-05-02 DIAGNOSIS — H3530 Unspecified macular degeneration: Secondary | ICD-10-CM | POA: Diagnosis not present

## 2015-05-02 DIAGNOSIS — I5030 Unspecified diastolic (congestive) heart failure: Secondary | ICD-10-CM | POA: Diagnosis not present

## 2015-05-02 DIAGNOSIS — I129 Hypertensive chronic kidney disease with stage 1 through stage 4 chronic kidney disease, or unspecified chronic kidney disease: Secondary | ICD-10-CM | POA: Diagnosis not present

## 2015-05-02 DIAGNOSIS — R5381 Other malaise: Secondary | ICD-10-CM | POA: Diagnosis not present

## 2015-05-02 DIAGNOSIS — M1611 Unilateral primary osteoarthritis, right hip: Secondary | ICD-10-CM | POA: Diagnosis not present

## 2015-05-02 DIAGNOSIS — I952 Hypotension due to drugs: Secondary | ICD-10-CM | POA: Diagnosis not present

## 2015-05-02 DIAGNOSIS — R269 Unspecified abnormalities of gait and mobility: Secondary | ICD-10-CM | POA: Diagnosis not present

## 2015-05-02 DIAGNOSIS — E46 Unspecified protein-calorie malnutrition: Secondary | ICD-10-CM | POA: Diagnosis not present

## 2015-05-02 DIAGNOSIS — K5900 Constipation, unspecified: Secondary | ICD-10-CM | POA: Diagnosis not present

## 2015-05-02 DIAGNOSIS — M6281 Muscle weakness (generalized): Secondary | ICD-10-CM | POA: Diagnosis not present

## 2015-05-02 DIAGNOSIS — N3281 Overactive bladder: Secondary | ICD-10-CM | POA: Diagnosis not present

## 2015-05-02 DIAGNOSIS — Z8701 Personal history of pneumonia (recurrent): Secondary | ICD-10-CM | POA: Diagnosis not present

## 2015-05-02 DIAGNOSIS — N183 Chronic kidney disease, stage 3 (moderate): Secondary | ICD-10-CM | POA: Diagnosis not present

## 2015-05-03 DIAGNOSIS — N3281 Overactive bladder: Secondary | ICD-10-CM | POA: Diagnosis not present

## 2015-05-03 DIAGNOSIS — I129 Hypertensive chronic kidney disease with stage 1 through stage 4 chronic kidney disease, or unspecified chronic kidney disease: Secondary | ICD-10-CM | POA: Diagnosis not present

## 2015-05-03 DIAGNOSIS — R5381 Other malaise: Secondary | ICD-10-CM | POA: Diagnosis not present

## 2015-05-03 DIAGNOSIS — N183 Chronic kidney disease, stage 3 (moderate): Secondary | ICD-10-CM | POA: Diagnosis not present

## 2015-05-03 DIAGNOSIS — H3530 Unspecified macular degeneration: Secondary | ICD-10-CM | POA: Diagnosis not present

## 2015-05-03 DIAGNOSIS — I5030 Unspecified diastolic (congestive) heart failure: Secondary | ICD-10-CM | POA: Diagnosis not present

## 2015-05-03 DIAGNOSIS — M1611 Unilateral primary osteoarthritis, right hip: Secondary | ICD-10-CM | POA: Diagnosis not present

## 2015-05-03 DIAGNOSIS — E46 Unspecified protein-calorie malnutrition: Secondary | ICD-10-CM | POA: Diagnosis not present

## 2015-05-03 DIAGNOSIS — I952 Hypotension due to drugs: Secondary | ICD-10-CM | POA: Diagnosis not present

## 2015-05-03 DIAGNOSIS — K5900 Constipation, unspecified: Secondary | ICD-10-CM | POA: Diagnosis not present

## 2015-05-03 DIAGNOSIS — Z8701 Personal history of pneumonia (recurrent): Secondary | ICD-10-CM | POA: Diagnosis not present

## 2015-05-03 DIAGNOSIS — R269 Unspecified abnormalities of gait and mobility: Secondary | ICD-10-CM | POA: Diagnosis not present

## 2015-05-03 DIAGNOSIS — M6281 Muscle weakness (generalized): Secondary | ICD-10-CM | POA: Diagnosis not present

## 2015-05-04 DIAGNOSIS — I5030 Unspecified diastolic (congestive) heart failure: Secondary | ICD-10-CM | POA: Diagnosis not present

## 2015-05-04 DIAGNOSIS — Z8701 Personal history of pneumonia (recurrent): Secondary | ICD-10-CM | POA: Diagnosis not present

## 2015-05-04 DIAGNOSIS — I129 Hypertensive chronic kidney disease with stage 1 through stage 4 chronic kidney disease, or unspecified chronic kidney disease: Secondary | ICD-10-CM | POA: Diagnosis not present

## 2015-05-04 DIAGNOSIS — M1611 Unilateral primary osteoarthritis, right hip: Secondary | ICD-10-CM | POA: Diagnosis not present

## 2015-05-04 DIAGNOSIS — E46 Unspecified protein-calorie malnutrition: Secondary | ICD-10-CM | POA: Diagnosis not present

## 2015-05-04 DIAGNOSIS — H3530 Unspecified macular degeneration: Secondary | ICD-10-CM | POA: Diagnosis not present

## 2015-05-04 DIAGNOSIS — R5381 Other malaise: Secondary | ICD-10-CM | POA: Diagnosis not present

## 2015-05-04 DIAGNOSIS — K5900 Constipation, unspecified: Secondary | ICD-10-CM | POA: Diagnosis not present

## 2015-05-04 DIAGNOSIS — M6281 Muscle weakness (generalized): Secondary | ICD-10-CM | POA: Diagnosis not present

## 2015-05-04 DIAGNOSIS — I952 Hypotension due to drugs: Secondary | ICD-10-CM | POA: Diagnosis not present

## 2015-05-04 DIAGNOSIS — N3281 Overactive bladder: Secondary | ICD-10-CM | POA: Diagnosis not present

## 2015-05-04 DIAGNOSIS — R269 Unspecified abnormalities of gait and mobility: Secondary | ICD-10-CM | POA: Diagnosis not present

## 2015-05-04 DIAGNOSIS — N183 Chronic kidney disease, stage 3 (moderate): Secondary | ICD-10-CM | POA: Diagnosis not present

## 2015-05-07 DIAGNOSIS — I5030 Unspecified diastolic (congestive) heart failure: Secondary | ICD-10-CM | POA: Diagnosis not present

## 2015-05-07 DIAGNOSIS — I952 Hypotension due to drugs: Secondary | ICD-10-CM | POA: Diagnosis not present

## 2015-05-07 DIAGNOSIS — Z8701 Personal history of pneumonia (recurrent): Secondary | ICD-10-CM | POA: Diagnosis not present

## 2015-05-07 DIAGNOSIS — K5900 Constipation, unspecified: Secondary | ICD-10-CM | POA: Diagnosis not present

## 2015-05-07 DIAGNOSIS — M1611 Unilateral primary osteoarthritis, right hip: Secondary | ICD-10-CM | POA: Diagnosis not present

## 2015-05-07 DIAGNOSIS — N3281 Overactive bladder: Secondary | ICD-10-CM | POA: Diagnosis not present

## 2015-05-07 DIAGNOSIS — R5381 Other malaise: Secondary | ICD-10-CM | POA: Diagnosis not present

## 2015-05-07 DIAGNOSIS — M6281 Muscle weakness (generalized): Secondary | ICD-10-CM | POA: Diagnosis not present

## 2015-05-07 DIAGNOSIS — N183 Chronic kidney disease, stage 3 (moderate): Secondary | ICD-10-CM | POA: Diagnosis not present

## 2015-05-07 DIAGNOSIS — I129 Hypertensive chronic kidney disease with stage 1 through stage 4 chronic kidney disease, or unspecified chronic kidney disease: Secondary | ICD-10-CM | POA: Diagnosis not present

## 2015-05-07 DIAGNOSIS — H3530 Unspecified macular degeneration: Secondary | ICD-10-CM | POA: Diagnosis not present

## 2015-05-07 DIAGNOSIS — R269 Unspecified abnormalities of gait and mobility: Secondary | ICD-10-CM | POA: Diagnosis not present

## 2015-05-07 DIAGNOSIS — E46 Unspecified protein-calorie malnutrition: Secondary | ICD-10-CM | POA: Diagnosis not present

## 2015-05-08 DIAGNOSIS — K5900 Constipation, unspecified: Secondary | ICD-10-CM | POA: Diagnosis not present

## 2015-05-08 DIAGNOSIS — E46 Unspecified protein-calorie malnutrition: Secondary | ICD-10-CM | POA: Diagnosis not present

## 2015-05-08 DIAGNOSIS — I952 Hypotension due to drugs: Secondary | ICD-10-CM | POA: Diagnosis not present

## 2015-05-08 DIAGNOSIS — M1611 Unilateral primary osteoarthritis, right hip: Secondary | ICD-10-CM | POA: Diagnosis not present

## 2015-05-08 DIAGNOSIS — M6281 Muscle weakness (generalized): Secondary | ICD-10-CM | POA: Diagnosis not present

## 2015-05-08 DIAGNOSIS — N183 Chronic kidney disease, stage 3 (moderate): Secondary | ICD-10-CM | POA: Diagnosis not present

## 2015-05-08 DIAGNOSIS — R5381 Other malaise: Secondary | ICD-10-CM | POA: Diagnosis not present

## 2015-05-08 DIAGNOSIS — I129 Hypertensive chronic kidney disease with stage 1 through stage 4 chronic kidney disease, or unspecified chronic kidney disease: Secondary | ICD-10-CM | POA: Diagnosis not present

## 2015-05-08 DIAGNOSIS — H3530 Unspecified macular degeneration: Secondary | ICD-10-CM | POA: Diagnosis not present

## 2015-05-08 DIAGNOSIS — I5030 Unspecified diastolic (congestive) heart failure: Secondary | ICD-10-CM | POA: Diagnosis not present

## 2015-05-08 DIAGNOSIS — R269 Unspecified abnormalities of gait and mobility: Secondary | ICD-10-CM | POA: Diagnosis not present

## 2015-05-08 DIAGNOSIS — N3281 Overactive bladder: Secondary | ICD-10-CM | POA: Diagnosis not present

## 2015-05-08 DIAGNOSIS — Z8701 Personal history of pneumonia (recurrent): Secondary | ICD-10-CM | POA: Diagnosis not present

## 2015-05-09 DIAGNOSIS — E46 Unspecified protein-calorie malnutrition: Secondary | ICD-10-CM | POA: Diagnosis not present

## 2015-05-09 DIAGNOSIS — I129 Hypertensive chronic kidney disease with stage 1 through stage 4 chronic kidney disease, or unspecified chronic kidney disease: Secondary | ICD-10-CM | POA: Diagnosis not present

## 2015-05-09 DIAGNOSIS — Z8701 Personal history of pneumonia (recurrent): Secondary | ICD-10-CM | POA: Diagnosis not present

## 2015-05-09 DIAGNOSIS — K5900 Constipation, unspecified: Secondary | ICD-10-CM | POA: Diagnosis not present

## 2015-05-09 DIAGNOSIS — H3530 Unspecified macular degeneration: Secondary | ICD-10-CM | POA: Diagnosis not present

## 2015-05-09 DIAGNOSIS — I952 Hypotension due to drugs: Secondary | ICD-10-CM | POA: Diagnosis not present

## 2015-05-09 DIAGNOSIS — M6281 Muscle weakness (generalized): Secondary | ICD-10-CM | POA: Diagnosis not present

## 2015-05-09 DIAGNOSIS — R269 Unspecified abnormalities of gait and mobility: Secondary | ICD-10-CM | POA: Diagnosis not present

## 2015-05-09 DIAGNOSIS — I5030 Unspecified diastolic (congestive) heart failure: Secondary | ICD-10-CM | POA: Diagnosis not present

## 2015-05-09 DIAGNOSIS — N183 Chronic kidney disease, stage 3 (moderate): Secondary | ICD-10-CM | POA: Diagnosis not present

## 2015-05-09 DIAGNOSIS — R5381 Other malaise: Secondary | ICD-10-CM | POA: Diagnosis not present

## 2015-05-09 DIAGNOSIS — N3281 Overactive bladder: Secondary | ICD-10-CM | POA: Diagnosis not present

## 2015-05-09 DIAGNOSIS — M1611 Unilateral primary osteoarthritis, right hip: Secondary | ICD-10-CM | POA: Diagnosis not present

## 2015-05-10 DIAGNOSIS — M1611 Unilateral primary osteoarthritis, right hip: Secondary | ICD-10-CM | POA: Diagnosis not present

## 2015-05-10 DIAGNOSIS — Z8701 Personal history of pneumonia (recurrent): Secondary | ICD-10-CM | POA: Diagnosis not present

## 2015-05-10 DIAGNOSIS — H3530 Unspecified macular degeneration: Secondary | ICD-10-CM | POA: Diagnosis not present

## 2015-05-10 DIAGNOSIS — N3281 Overactive bladder: Secondary | ICD-10-CM | POA: Diagnosis not present

## 2015-05-10 DIAGNOSIS — E46 Unspecified protein-calorie malnutrition: Secondary | ICD-10-CM | POA: Diagnosis not present

## 2015-05-10 DIAGNOSIS — R5381 Other malaise: Secondary | ICD-10-CM | POA: Diagnosis not present

## 2015-05-10 DIAGNOSIS — R269 Unspecified abnormalities of gait and mobility: Secondary | ICD-10-CM | POA: Diagnosis not present

## 2015-05-10 DIAGNOSIS — K5900 Constipation, unspecified: Secondary | ICD-10-CM | POA: Diagnosis not present

## 2015-05-10 DIAGNOSIS — M6281 Muscle weakness (generalized): Secondary | ICD-10-CM | POA: Diagnosis not present

## 2015-05-10 DIAGNOSIS — N183 Chronic kidney disease, stage 3 (moderate): Secondary | ICD-10-CM | POA: Diagnosis not present

## 2015-05-10 DIAGNOSIS — I952 Hypotension due to drugs: Secondary | ICD-10-CM | POA: Diagnosis not present

## 2015-05-10 DIAGNOSIS — I129 Hypertensive chronic kidney disease with stage 1 through stage 4 chronic kidney disease, or unspecified chronic kidney disease: Secondary | ICD-10-CM | POA: Diagnosis not present

## 2015-05-10 DIAGNOSIS — I5030 Unspecified diastolic (congestive) heart failure: Secondary | ICD-10-CM | POA: Diagnosis not present

## 2015-05-10 NOTE — Telephone Encounter (Signed)
Pt went to [facility]

## 2015-05-11 ENCOUNTER — Telehealth: Payer: Self-pay | Admitting: Family Medicine

## 2015-05-11 DIAGNOSIS — Z8701 Personal history of pneumonia (recurrent): Secondary | ICD-10-CM | POA: Diagnosis not present

## 2015-05-11 DIAGNOSIS — E46 Unspecified protein-calorie malnutrition: Secondary | ICD-10-CM | POA: Diagnosis not present

## 2015-05-11 DIAGNOSIS — M6281 Muscle weakness (generalized): Secondary | ICD-10-CM | POA: Diagnosis not present

## 2015-05-11 DIAGNOSIS — K5900 Constipation, unspecified: Secondary | ICD-10-CM | POA: Diagnosis not present

## 2015-05-11 DIAGNOSIS — M1611 Unilateral primary osteoarthritis, right hip: Secondary | ICD-10-CM | POA: Diagnosis not present

## 2015-05-11 DIAGNOSIS — N3281 Overactive bladder: Secondary | ICD-10-CM | POA: Diagnosis not present

## 2015-05-11 DIAGNOSIS — I5030 Unspecified diastolic (congestive) heart failure: Secondary | ICD-10-CM | POA: Diagnosis not present

## 2015-05-11 DIAGNOSIS — R5381 Other malaise: Secondary | ICD-10-CM | POA: Diagnosis not present

## 2015-05-11 DIAGNOSIS — I952 Hypotension due to drugs: Secondary | ICD-10-CM | POA: Diagnosis not present

## 2015-05-11 DIAGNOSIS — H3530 Unspecified macular degeneration: Secondary | ICD-10-CM | POA: Diagnosis not present

## 2015-05-11 DIAGNOSIS — N183 Chronic kidney disease, stage 3 (moderate): Secondary | ICD-10-CM | POA: Diagnosis not present

## 2015-05-11 DIAGNOSIS — R269 Unspecified abnormalities of gait and mobility: Secondary | ICD-10-CM | POA: Diagnosis not present

## 2015-05-11 DIAGNOSIS — I129 Hypertensive chronic kidney disease with stage 1 through stage 4 chronic kidney disease, or unspecified chronic kidney disease: Secondary | ICD-10-CM | POA: Diagnosis not present

## 2015-05-11 NOTE — Telephone Encounter (Signed)
Patient's daughter states that Roger [name] received a jury summons, and since he is 53 she said he is above the age of having to go.  She needs a letter stating his age to send to the court.

## 2015-05-11 NOTE — Telephone Encounter (Signed)
Yes absolutely

## 2015-05-11 NOTE — Telephone Encounter (Signed)
Patient's daughter needs a letter stating that Roger [name] is 81 years old.  He received a summons for jury duty and daughter states he is above the age of having to go.

## 2015-05-11 NOTE — Telephone Encounter (Signed)
Ok to write? 

## 2015-05-14 ENCOUNTER — Telehealth: Payer: Self-pay | Admitting: *Deleted

## 2015-05-14 DIAGNOSIS — E46 Unspecified protein-calorie malnutrition: Secondary | ICD-10-CM | POA: Diagnosis not present

## 2015-05-14 DIAGNOSIS — H3530 Unspecified macular degeneration: Secondary | ICD-10-CM | POA: Diagnosis not present

## 2015-05-14 DIAGNOSIS — K5900 Constipation, unspecified: Secondary | ICD-10-CM | POA: Diagnosis not present

## 2015-05-14 DIAGNOSIS — R269 Unspecified abnormalities of gait and mobility: Secondary | ICD-10-CM | POA: Diagnosis not present

## 2015-05-14 DIAGNOSIS — M6281 Muscle weakness (generalized): Secondary | ICD-10-CM | POA: Diagnosis not present

## 2015-05-14 DIAGNOSIS — M1611 Unilateral primary osteoarthritis, right hip: Secondary | ICD-10-CM | POA: Diagnosis not present

## 2015-05-14 DIAGNOSIS — I952 Hypotension due to drugs: Secondary | ICD-10-CM | POA: Diagnosis not present

## 2015-05-14 DIAGNOSIS — N3281 Overactive bladder: Secondary | ICD-10-CM | POA: Diagnosis not present

## 2015-05-14 DIAGNOSIS — R5381 Other malaise: Secondary | ICD-10-CM | POA: Diagnosis not present

## 2015-05-14 DIAGNOSIS — N183 Chronic kidney disease, stage 3 (moderate): Secondary | ICD-10-CM | POA: Diagnosis not present

## 2015-05-14 DIAGNOSIS — I5030 Unspecified diastolic (congestive) heart failure: Secondary | ICD-10-CM | POA: Diagnosis not present

## 2015-05-14 DIAGNOSIS — I129 Hypertensive chronic kidney disease with stage 1 through stage 4 chronic kidney disease, or unspecified chronic kidney disease: Secondary | ICD-10-CM | POA: Diagnosis not present

## 2015-05-14 DIAGNOSIS — Z8701 Personal history of pneumonia (recurrent): Secondary | ICD-10-CM | POA: Diagnosis not present

## 2015-05-14 NOTE — Telephone Encounter (Signed)
Letter printed and up front for pick up, Lm on daughter cell phone to make aware.

## 2015-05-14 NOTE — Telephone Encounter (Signed)
Roger [name] will write a letter.

## 2015-05-15 DIAGNOSIS — Z8701 Personal history of pneumonia (recurrent): Secondary | ICD-10-CM | POA: Diagnosis not present

## 2015-05-15 DIAGNOSIS — I129 Hypertensive chronic kidney disease with stage 1 through stage 4 chronic kidney disease, or unspecified chronic kidney disease: Secondary | ICD-10-CM | POA: Diagnosis not present

## 2015-05-15 DIAGNOSIS — N183 Chronic kidney disease, stage 3 (moderate): Secondary | ICD-10-CM | POA: Diagnosis not present

## 2015-05-15 DIAGNOSIS — N3281 Overactive bladder: Secondary | ICD-10-CM | POA: Diagnosis not present

## 2015-05-15 DIAGNOSIS — R269 Unspecified abnormalities of gait and mobility: Secondary | ICD-10-CM | POA: Diagnosis not present

## 2015-05-15 DIAGNOSIS — M1611 Unilateral primary osteoarthritis, right hip: Secondary | ICD-10-CM | POA: Diagnosis not present

## 2015-05-15 DIAGNOSIS — I952 Hypotension due to drugs: Secondary | ICD-10-CM | POA: Diagnosis not present

## 2015-05-15 DIAGNOSIS — H3530 Unspecified macular degeneration: Secondary | ICD-10-CM | POA: Diagnosis not present

## 2015-05-15 DIAGNOSIS — M6281 Muscle weakness (generalized): Secondary | ICD-10-CM | POA: Diagnosis not present

## 2015-05-15 DIAGNOSIS — R5381 Other malaise: Secondary | ICD-10-CM | POA: Diagnosis not present

## 2015-05-15 DIAGNOSIS — K5900 Constipation, unspecified: Secondary | ICD-10-CM | POA: Diagnosis not present

## 2015-05-15 DIAGNOSIS — I5030 Unspecified diastolic (congestive) heart failure: Secondary | ICD-10-CM | POA: Diagnosis not present

## 2015-05-15 DIAGNOSIS — E46 Unspecified protein-calorie malnutrition: Secondary | ICD-10-CM | POA: Diagnosis not present

## 2015-05-16 DIAGNOSIS — H3530 Unspecified macular degeneration: Secondary | ICD-10-CM | POA: Diagnosis not present

## 2015-05-16 DIAGNOSIS — I129 Hypertensive chronic kidney disease with stage 1 through stage 4 chronic kidney disease, or unspecified chronic kidney disease: Secondary | ICD-10-CM | POA: Diagnosis not present

## 2015-05-16 DIAGNOSIS — R269 Unspecified abnormalities of gait and mobility: Secondary | ICD-10-CM | POA: Diagnosis not present

## 2015-05-16 DIAGNOSIS — K5900 Constipation, unspecified: Secondary | ICD-10-CM | POA: Diagnosis not present

## 2015-05-16 DIAGNOSIS — R5381 Other malaise: Secondary | ICD-10-CM | POA: Diagnosis not present

## 2015-05-16 DIAGNOSIS — M1611 Unilateral primary osteoarthritis, right hip: Secondary | ICD-10-CM | POA: Diagnosis not present

## 2015-05-16 DIAGNOSIS — E46 Unspecified protein-calorie malnutrition: Secondary | ICD-10-CM | POA: Diagnosis not present

## 2015-05-16 DIAGNOSIS — I5030 Unspecified diastolic (congestive) heart failure: Secondary | ICD-10-CM | POA: Diagnosis not present

## 2015-05-16 DIAGNOSIS — I952 Hypotension due to drugs: Secondary | ICD-10-CM | POA: Diagnosis not present

## 2015-05-16 DIAGNOSIS — N3281 Overactive bladder: Secondary | ICD-10-CM | POA: Diagnosis not present

## 2015-05-16 DIAGNOSIS — N183 Chronic kidney disease, stage 3 (moderate): Secondary | ICD-10-CM | POA: Diagnosis not present

## 2015-05-16 DIAGNOSIS — M6281 Muscle weakness (generalized): Secondary | ICD-10-CM | POA: Diagnosis not present

## 2015-05-16 DIAGNOSIS — Z8701 Personal history of pneumonia (recurrent): Secondary | ICD-10-CM | POA: Diagnosis not present

## 2015-05-17 DIAGNOSIS — M6281 Muscle weakness (generalized): Secondary | ICD-10-CM | POA: Diagnosis not present

## 2015-05-17 DIAGNOSIS — E46 Unspecified protein-calorie malnutrition: Secondary | ICD-10-CM | POA: Diagnosis not present

## 2015-05-17 DIAGNOSIS — H3530 Unspecified macular degeneration: Secondary | ICD-10-CM | POA: Diagnosis not present

## 2015-05-17 DIAGNOSIS — K5900 Constipation, unspecified: Secondary | ICD-10-CM | POA: Diagnosis not present

## 2015-05-17 DIAGNOSIS — I952 Hypotension due to drugs: Secondary | ICD-10-CM | POA: Diagnosis not present

## 2015-05-17 DIAGNOSIS — N3281 Overactive bladder: Secondary | ICD-10-CM | POA: Diagnosis not present

## 2015-05-17 DIAGNOSIS — N183 Chronic kidney disease, stage 3 (moderate): Secondary | ICD-10-CM | POA: Diagnosis not present

## 2015-05-17 DIAGNOSIS — I5030 Unspecified diastolic (congestive) heart failure: Secondary | ICD-10-CM | POA: Diagnosis not present

## 2015-05-17 DIAGNOSIS — I129 Hypertensive chronic kidney disease with stage 1 through stage 4 chronic kidney disease, or unspecified chronic kidney disease: Secondary | ICD-10-CM | POA: Diagnosis not present

## 2015-05-17 DIAGNOSIS — R269 Unspecified abnormalities of gait and mobility: Secondary | ICD-10-CM | POA: Diagnosis not present

## 2015-05-17 DIAGNOSIS — R5381 Other malaise: Secondary | ICD-10-CM | POA: Diagnosis not present

## 2015-05-17 DIAGNOSIS — Z8701 Personal history of pneumonia (recurrent): Secondary | ICD-10-CM | POA: Diagnosis not present

## 2015-05-17 DIAGNOSIS — M1611 Unilateral primary osteoarthritis, right hip: Secondary | ICD-10-CM | POA: Diagnosis not present

## 2015-05-18 DIAGNOSIS — H3530 Unspecified macular degeneration: Secondary | ICD-10-CM | POA: Diagnosis not present

## 2015-05-18 DIAGNOSIS — E46 Unspecified protein-calorie malnutrition: Secondary | ICD-10-CM | POA: Diagnosis not present

## 2015-05-18 DIAGNOSIS — I952 Hypotension due to drugs: Secondary | ICD-10-CM | POA: Diagnosis not present

## 2015-05-18 DIAGNOSIS — N183 Chronic kidney disease, stage 3 (moderate): Secondary | ICD-10-CM | POA: Diagnosis not present

## 2015-05-18 DIAGNOSIS — N3281 Overactive bladder: Secondary | ICD-10-CM | POA: Diagnosis not present

## 2015-05-18 DIAGNOSIS — M1611 Unilateral primary osteoarthritis, right hip: Secondary | ICD-10-CM | POA: Diagnosis not present

## 2015-05-18 DIAGNOSIS — R269 Unspecified abnormalities of gait and mobility: Secondary | ICD-10-CM | POA: Diagnosis not present

## 2015-05-18 DIAGNOSIS — K5900 Constipation, unspecified: Secondary | ICD-10-CM | POA: Diagnosis not present

## 2015-05-18 DIAGNOSIS — Z8701 Personal history of pneumonia (recurrent): Secondary | ICD-10-CM | POA: Diagnosis not present

## 2015-05-18 DIAGNOSIS — R5381 Other malaise: Secondary | ICD-10-CM | POA: Diagnosis not present

## 2015-05-18 DIAGNOSIS — M6281 Muscle weakness (generalized): Secondary | ICD-10-CM | POA: Diagnosis not present

## 2015-05-18 DIAGNOSIS — I5030 Unspecified diastolic (congestive) heart failure: Secondary | ICD-10-CM | POA: Diagnosis not present

## 2015-05-18 DIAGNOSIS — I129 Hypertensive chronic kidney disease with stage 1 through stage 4 chronic kidney disease, or unspecified chronic kidney disease: Secondary | ICD-10-CM | POA: Diagnosis not present

## 2015-05-18 NOTE — Telephone Encounter (Signed)
Pt went to [facility] in [place]

## 2015-05-22 DIAGNOSIS — I952 Hypotension due to drugs: Secondary | ICD-10-CM | POA: Diagnosis not present

## 2015-05-22 DIAGNOSIS — H3530 Unspecified macular degeneration: Secondary | ICD-10-CM | POA: Diagnosis not present

## 2015-05-22 DIAGNOSIS — K5900 Constipation, unspecified: Secondary | ICD-10-CM | POA: Diagnosis not present

## 2015-05-22 DIAGNOSIS — N183 Chronic kidney disease, stage 3 (moderate): Secondary | ICD-10-CM | POA: Diagnosis not present

## 2015-05-22 DIAGNOSIS — E46 Unspecified protein-calorie malnutrition: Secondary | ICD-10-CM | POA: Diagnosis not present

## 2015-05-22 DIAGNOSIS — R5381 Other malaise: Secondary | ICD-10-CM | POA: Diagnosis not present

## 2015-05-22 DIAGNOSIS — R269 Unspecified abnormalities of gait and mobility: Secondary | ICD-10-CM | POA: Diagnosis not present

## 2015-05-22 DIAGNOSIS — M6281 Muscle weakness (generalized): Secondary | ICD-10-CM | POA: Diagnosis not present

## 2015-05-22 DIAGNOSIS — I129 Hypertensive chronic kidney disease with stage 1 through stage 4 chronic kidney disease, or unspecified chronic kidney disease: Secondary | ICD-10-CM | POA: Diagnosis not present

## 2015-05-22 DIAGNOSIS — Z8701 Personal history of pneumonia (recurrent): Secondary | ICD-10-CM | POA: Diagnosis not present

## 2015-05-22 DIAGNOSIS — M1611 Unilateral primary osteoarthritis, right hip: Secondary | ICD-10-CM | POA: Diagnosis not present

## 2015-05-22 DIAGNOSIS — I5030 Unspecified diastolic (congestive) heart failure: Secondary | ICD-10-CM | POA: Diagnosis not present

## 2015-05-22 DIAGNOSIS — N3281 Overactive bladder: Secondary | ICD-10-CM | POA: Diagnosis not present

## 2015-05-23 DIAGNOSIS — M6281 Muscle weakness (generalized): Secondary | ICD-10-CM | POA: Diagnosis not present

## 2015-05-23 DIAGNOSIS — I129 Hypertensive chronic kidney disease with stage 1 through stage 4 chronic kidney disease, or unspecified chronic kidney disease: Secondary | ICD-10-CM | POA: Diagnosis not present

## 2015-05-23 DIAGNOSIS — R269 Unspecified abnormalities of gait and mobility: Secondary | ICD-10-CM | POA: Diagnosis not present

## 2015-05-23 DIAGNOSIS — K5900 Constipation, unspecified: Secondary | ICD-10-CM | POA: Diagnosis not present

## 2015-05-23 DIAGNOSIS — Z8701 Personal history of pneumonia (recurrent): Secondary | ICD-10-CM | POA: Diagnosis not present

## 2015-05-23 DIAGNOSIS — R5381 Other malaise: Secondary | ICD-10-CM | POA: Diagnosis not present

## 2015-05-23 DIAGNOSIS — N3281 Overactive bladder: Secondary | ICD-10-CM | POA: Diagnosis not present

## 2015-05-23 DIAGNOSIS — H3530 Unspecified macular degeneration: Secondary | ICD-10-CM | POA: Diagnosis not present

## 2015-05-23 DIAGNOSIS — I5030 Unspecified diastolic (congestive) heart failure: Secondary | ICD-10-CM | POA: Diagnosis not present

## 2015-05-23 DIAGNOSIS — N183 Chronic kidney disease, stage 3 (moderate): Secondary | ICD-10-CM | POA: Diagnosis not present

## 2015-05-23 DIAGNOSIS — E46 Unspecified protein-calorie malnutrition: Secondary | ICD-10-CM | POA: Diagnosis not present

## 2015-05-23 DIAGNOSIS — M1611 Unilateral primary osteoarthritis, right hip: Secondary | ICD-10-CM | POA: Diagnosis not present

## 2015-05-23 DIAGNOSIS — I952 Hypotension due to drugs: Secondary | ICD-10-CM | POA: Diagnosis not present

## 2015-05-24 DIAGNOSIS — R269 Unspecified abnormalities of gait and mobility: Secondary | ICD-10-CM | POA: Diagnosis not present

## 2015-05-24 DIAGNOSIS — I129 Hypertensive chronic kidney disease with stage 1 through stage 4 chronic kidney disease, or unspecified chronic kidney disease: Secondary | ICD-10-CM | POA: Diagnosis not present

## 2015-05-24 DIAGNOSIS — I952 Hypotension due to drugs: Secondary | ICD-10-CM | POA: Diagnosis not present

## 2015-05-24 DIAGNOSIS — N3281 Overactive bladder: Secondary | ICD-10-CM | POA: Diagnosis not present

## 2015-05-24 DIAGNOSIS — E46 Unspecified protein-calorie malnutrition: Secondary | ICD-10-CM | POA: Diagnosis not present

## 2015-05-24 DIAGNOSIS — M1611 Unilateral primary osteoarthritis, right hip: Secondary | ICD-10-CM | POA: Diagnosis not present

## 2015-05-24 DIAGNOSIS — N183 Chronic kidney disease, stage 3 (moderate): Secondary | ICD-10-CM | POA: Diagnosis not present

## 2015-05-24 DIAGNOSIS — I5030 Unspecified diastolic (congestive) heart failure: Secondary | ICD-10-CM | POA: Diagnosis not present

## 2015-05-24 DIAGNOSIS — M6281 Muscle weakness (generalized): Secondary | ICD-10-CM | POA: Diagnosis not present

## 2015-05-24 DIAGNOSIS — H3530 Unspecified macular degeneration: Secondary | ICD-10-CM | POA: Diagnosis not present

## 2015-05-24 DIAGNOSIS — Z8701 Personal history of pneumonia (recurrent): Secondary | ICD-10-CM | POA: Diagnosis not present

## 2015-05-24 DIAGNOSIS — R5381 Other malaise: Secondary | ICD-10-CM | POA: Diagnosis not present

## 2015-05-24 DIAGNOSIS — K5900 Constipation, unspecified: Secondary | ICD-10-CM | POA: Diagnosis not present

## 2015-05-25 DIAGNOSIS — M6281 Muscle weakness (generalized): Secondary | ICD-10-CM | POA: Diagnosis not present

## 2015-05-25 DIAGNOSIS — I129 Hypertensive chronic kidney disease with stage 1 through stage 4 chronic kidney disease, or unspecified chronic kidney disease: Secondary | ICD-10-CM | POA: Diagnosis not present

## 2015-05-25 DIAGNOSIS — R5381 Other malaise: Secondary | ICD-10-CM | POA: Diagnosis not present

## 2015-05-25 DIAGNOSIS — K5900 Constipation, unspecified: Secondary | ICD-10-CM | POA: Diagnosis not present

## 2015-05-25 DIAGNOSIS — N183 Chronic kidney disease, stage 3 (moderate): Secondary | ICD-10-CM | POA: Diagnosis not present

## 2015-05-25 DIAGNOSIS — H3530 Unspecified macular degeneration: Secondary | ICD-10-CM | POA: Diagnosis not present

## 2015-05-25 DIAGNOSIS — M1611 Unilateral primary osteoarthritis, right hip: Secondary | ICD-10-CM | POA: Diagnosis not present

## 2015-05-25 DIAGNOSIS — R269 Unspecified abnormalities of gait and mobility: Secondary | ICD-10-CM | POA: Diagnosis not present

## 2015-05-25 DIAGNOSIS — I952 Hypotension due to drugs: Secondary | ICD-10-CM | POA: Diagnosis not present

## 2015-05-25 DIAGNOSIS — Z8701 Personal history of pneumonia (recurrent): Secondary | ICD-10-CM | POA: Diagnosis not present

## 2015-05-25 DIAGNOSIS — I5030 Unspecified diastolic (congestive) heart failure: Secondary | ICD-10-CM | POA: Diagnosis not present

## 2015-05-25 DIAGNOSIS — N3281 Overactive bladder: Secondary | ICD-10-CM | POA: Diagnosis not present

## 2015-05-25 DIAGNOSIS — E46 Unspecified protein-calorie malnutrition: Secondary | ICD-10-CM | POA: Diagnosis not present

## 2015-05-29 DIAGNOSIS — N3281 Overactive bladder: Secondary | ICD-10-CM | POA: Diagnosis not present

## 2015-05-29 DIAGNOSIS — E46 Unspecified protein-calorie malnutrition: Secondary | ICD-10-CM | POA: Diagnosis not present

## 2015-05-29 DIAGNOSIS — Z8701 Personal history of pneumonia (recurrent): Secondary | ICD-10-CM | POA: Diagnosis not present

## 2015-05-29 DIAGNOSIS — I5030 Unspecified diastolic (congestive) heart failure: Secondary | ICD-10-CM | POA: Diagnosis not present

## 2015-05-29 DIAGNOSIS — N183 Chronic kidney disease, stage 3 (moderate): Secondary | ICD-10-CM | POA: Diagnosis not present

## 2015-05-29 DIAGNOSIS — R5381 Other malaise: Secondary | ICD-10-CM | POA: Diagnosis not present

## 2015-05-29 DIAGNOSIS — I952 Hypotension due to drugs: Secondary | ICD-10-CM | POA: Diagnosis not present

## 2015-05-29 DIAGNOSIS — M1611 Unilateral primary osteoarthritis, right hip: Secondary | ICD-10-CM | POA: Diagnosis not present

## 2015-05-29 DIAGNOSIS — H3530 Unspecified macular degeneration: Secondary | ICD-10-CM | POA: Diagnosis not present

## 2015-05-29 DIAGNOSIS — M6281 Muscle weakness (generalized): Secondary | ICD-10-CM | POA: Diagnosis not present

## 2015-05-29 DIAGNOSIS — R269 Unspecified abnormalities of gait and mobility: Secondary | ICD-10-CM | POA: Diagnosis not present

## 2015-05-29 DIAGNOSIS — I129 Hypertensive chronic kidney disease with stage 1 through stage 4 chronic kidney disease, or unspecified chronic kidney disease: Secondary | ICD-10-CM | POA: Diagnosis not present

## 2015-05-29 DIAGNOSIS — K5900 Constipation, unspecified: Secondary | ICD-10-CM | POA: Diagnosis not present

## 2015-05-30 ENCOUNTER — Telehealth: Payer: Self-pay | Admitting: Family Medicine

## 2015-05-30 DIAGNOSIS — R5381 Other malaise: Secondary | ICD-10-CM | POA: Diagnosis not present

## 2015-05-30 DIAGNOSIS — M6281 Muscle weakness (generalized): Secondary | ICD-10-CM | POA: Diagnosis not present

## 2015-05-30 DIAGNOSIS — R269 Unspecified abnormalities of gait and mobility: Secondary | ICD-10-CM | POA: Diagnosis not present

## 2015-05-30 DIAGNOSIS — N3281 Overactive bladder: Secondary | ICD-10-CM | POA: Diagnosis not present

## 2015-05-30 DIAGNOSIS — Z8701 Personal history of pneumonia (recurrent): Secondary | ICD-10-CM | POA: Diagnosis not present

## 2015-05-30 DIAGNOSIS — I952 Hypotension due to drugs: Secondary | ICD-10-CM | POA: Diagnosis not present

## 2015-05-30 DIAGNOSIS — I5030 Unspecified diastolic (congestive) heart failure: Secondary | ICD-10-CM | POA: Diagnosis not present

## 2015-05-30 DIAGNOSIS — K5900 Constipation, unspecified: Secondary | ICD-10-CM | POA: Diagnosis not present

## 2015-05-30 DIAGNOSIS — N183 Chronic kidney disease, stage 3 (moderate): Secondary | ICD-10-CM | POA: Diagnosis not present

## 2015-05-30 DIAGNOSIS — E46 Unspecified protein-calorie malnutrition: Secondary | ICD-10-CM | POA: Diagnosis not present

## 2015-05-30 DIAGNOSIS — I129 Hypertensive chronic kidney disease with stage 1 through stage 4 chronic kidney disease, or unspecified chronic kidney disease: Secondary | ICD-10-CM | POA: Diagnosis not present

## 2015-05-30 DIAGNOSIS — H3530 Unspecified macular degeneration: Secondary | ICD-10-CM | POA: Diagnosis not present

## 2015-05-30 DIAGNOSIS — M1611 Unilateral primary osteoarthritis, right hip: Secondary | ICD-10-CM | POA: Diagnosis not present

## 2015-05-30 NOTE — Telephone Encounter (Signed)
[facility] request verbal order to continue physical therapy  twice a week for up to 9 weeks   [phone]

## 2015-05-31 ENCOUNTER — Telehealth: Payer: Self-pay | Admitting: Family Medicine

## 2015-05-31 DIAGNOSIS — N3281 Overactive bladder: Secondary | ICD-10-CM | POA: Diagnosis not present

## 2015-05-31 DIAGNOSIS — R5381 Other malaise: Secondary | ICD-10-CM | POA: Diagnosis not present

## 2015-05-31 DIAGNOSIS — M6281 Muscle weakness (generalized): Secondary | ICD-10-CM | POA: Diagnosis not present

## 2015-05-31 DIAGNOSIS — M1611 Unilateral primary osteoarthritis, right hip: Secondary | ICD-10-CM | POA: Diagnosis not present

## 2015-05-31 DIAGNOSIS — I952 Hypotension due to drugs: Secondary | ICD-10-CM | POA: Diagnosis not present

## 2015-05-31 DIAGNOSIS — I5030 Unspecified diastolic (congestive) heart failure: Secondary | ICD-10-CM | POA: Diagnosis not present

## 2015-05-31 DIAGNOSIS — H3530 Unspecified macular degeneration: Secondary | ICD-10-CM | POA: Diagnosis not present

## 2015-05-31 DIAGNOSIS — R269 Unspecified abnormalities of gait and mobility: Secondary | ICD-10-CM | POA: Diagnosis not present

## 2015-05-31 DIAGNOSIS — N183 Chronic kidney disease, stage 3 (moderate): Secondary | ICD-10-CM | POA: Diagnosis not present

## 2015-05-31 DIAGNOSIS — E46 Unspecified protein-calorie malnutrition: Secondary | ICD-10-CM | POA: Diagnosis not present

## 2015-05-31 DIAGNOSIS — I129 Hypertensive chronic kidney disease with stage 1 through stage 4 chronic kidney disease, or unspecified chronic kidney disease: Secondary | ICD-10-CM | POA: Diagnosis not present

## 2015-05-31 DIAGNOSIS — Z8701 Personal history of pneumonia (recurrent): Secondary | ICD-10-CM | POA: Diagnosis not present

## 2015-05-31 DIAGNOSIS — K5900 Constipation, unspecified: Secondary | ICD-10-CM | POA: Diagnosis not present

## 2015-05-31 NOTE — Telephone Encounter (Signed)
Spoke with Mechele Claude and provided verbal orders.

## 2015-05-31 NOTE — Telephone Encounter (Signed)
Returned Mallory's call providing VO.

## 2015-05-31 NOTE — Telephone Encounter (Signed)
Maleria from [place] requesting verbal orders for Occupational Therapy  Twice a week for 8 weeks  She is aslo requesting Orders for home health aide twice a week for 6 weeks    [phone]

## 2015-06-01 DIAGNOSIS — R269 Unspecified abnormalities of gait and mobility: Secondary | ICD-10-CM | POA: Diagnosis not present

## 2015-06-01 DIAGNOSIS — I5030 Unspecified diastolic (congestive) heart failure: Secondary | ICD-10-CM | POA: Diagnosis not present

## 2015-06-01 DIAGNOSIS — N3281 Overactive bladder: Secondary | ICD-10-CM | POA: Diagnosis not present

## 2015-06-01 DIAGNOSIS — Z8701 Personal history of pneumonia (recurrent): Secondary | ICD-10-CM | POA: Diagnosis not present

## 2015-06-01 DIAGNOSIS — N183 Chronic kidney disease, stage 3 (moderate): Secondary | ICD-10-CM | POA: Diagnosis not present

## 2015-06-01 DIAGNOSIS — M6281 Muscle weakness (generalized): Secondary | ICD-10-CM | POA: Diagnosis not present

## 2015-06-01 DIAGNOSIS — H3530 Unspecified macular degeneration: Secondary | ICD-10-CM | POA: Diagnosis not present

## 2015-06-01 DIAGNOSIS — K5900 Constipation, unspecified: Secondary | ICD-10-CM | POA: Diagnosis not present

## 2015-06-01 DIAGNOSIS — I952 Hypotension due to drugs: Secondary | ICD-10-CM | POA: Diagnosis not present

## 2015-06-01 DIAGNOSIS — E46 Unspecified protein-calorie malnutrition: Secondary | ICD-10-CM | POA: Diagnosis not present

## 2015-06-01 DIAGNOSIS — I129 Hypertensive chronic kidney disease with stage 1 through stage 4 chronic kidney disease, or unspecified chronic kidney disease: Secondary | ICD-10-CM | POA: Diagnosis not present

## 2015-06-01 DIAGNOSIS — M1611 Unilateral primary osteoarthritis, right hip: Secondary | ICD-10-CM | POA: Diagnosis not present

## 2015-06-01 DIAGNOSIS — R5381 Other malaise: Secondary | ICD-10-CM | POA: Diagnosis not present

## 2015-06-05 DIAGNOSIS — M1711 Unilateral primary osteoarthritis, right knee: Secondary | ICD-10-CM | POA: Diagnosis not present

## 2015-06-05 DIAGNOSIS — Z8701 Personal history of pneumonia (recurrent): Secondary | ICD-10-CM | POA: Diagnosis not present

## 2015-06-05 DIAGNOSIS — Z87891 Personal history of nicotine dependence: Secondary | ICD-10-CM | POA: Diagnosis not present

## 2015-06-05 DIAGNOSIS — I5030 Unspecified diastolic (congestive) heart failure: Secondary | ICD-10-CM | POA: Diagnosis not present

## 2015-06-05 DIAGNOSIS — M6281 Muscle weakness (generalized): Secondary | ICD-10-CM | POA: Diagnosis not present

## 2015-06-05 DIAGNOSIS — Z9181 History of falling: Secondary | ICD-10-CM | POA: Diagnosis not present

## 2015-06-05 DIAGNOSIS — N183 Chronic kidney disease, stage 3 (moderate): Secondary | ICD-10-CM | POA: Diagnosis not present

## 2015-06-05 DIAGNOSIS — D631 Anemia in chronic kidney disease: Secondary | ICD-10-CM | POA: Diagnosis not present

## 2015-06-05 DIAGNOSIS — I130 Hypertensive heart and chronic kidney disease with heart failure and stage 1 through stage 4 chronic kidney disease, or unspecified chronic kidney disease: Secondary | ICD-10-CM | POA: Diagnosis not present

## 2015-06-05 DIAGNOSIS — Z7982 Long term (current) use of aspirin: Secondary | ICD-10-CM | POA: Diagnosis not present

## 2015-06-05 DIAGNOSIS — M1611 Unilateral primary osteoarthritis, right hip: Secondary | ICD-10-CM | POA: Diagnosis not present

## 2015-06-06 DIAGNOSIS — M6281 Muscle weakness (generalized): Secondary | ICD-10-CM | POA: Diagnosis not present

## 2015-06-06 DIAGNOSIS — Z7982 Long term (current) use of aspirin: Secondary | ICD-10-CM | POA: Diagnosis not present

## 2015-06-06 DIAGNOSIS — Z9181 History of falling: Secondary | ICD-10-CM | POA: Diagnosis not present

## 2015-06-06 DIAGNOSIS — M1711 Unilateral primary osteoarthritis, right knee: Secondary | ICD-10-CM | POA: Diagnosis not present

## 2015-06-06 DIAGNOSIS — N183 Chronic kidney disease, stage 3 (moderate): Secondary | ICD-10-CM | POA: Diagnosis not present

## 2015-06-06 DIAGNOSIS — I130 Hypertensive heart and chronic kidney disease with heart failure and stage 1 through stage 4 chronic kidney disease, or unspecified chronic kidney disease: Secondary | ICD-10-CM | POA: Diagnosis not present

## 2015-06-06 DIAGNOSIS — Z8701 Personal history of pneumonia (recurrent): Secondary | ICD-10-CM | POA: Diagnosis not present

## 2015-06-06 DIAGNOSIS — M1611 Unilateral primary osteoarthritis, right hip: Secondary | ICD-10-CM | POA: Diagnosis not present

## 2015-06-06 DIAGNOSIS — D631 Anemia in chronic kidney disease: Secondary | ICD-10-CM | POA: Diagnosis not present

## 2015-06-06 DIAGNOSIS — I5030 Unspecified diastolic (congestive) heart failure: Secondary | ICD-10-CM | POA: Diagnosis not present

## 2015-06-06 DIAGNOSIS — Z87891 Personal history of nicotine dependence: Secondary | ICD-10-CM | POA: Diagnosis not present

## 2015-06-07 DIAGNOSIS — M1711 Unilateral primary osteoarthritis, right knee: Secondary | ICD-10-CM | POA: Diagnosis not present

## 2015-06-07 DIAGNOSIS — Z9181 History of falling: Secondary | ICD-10-CM | POA: Diagnosis not present

## 2015-06-07 DIAGNOSIS — N183 Chronic kidney disease, stage 3 (moderate): Secondary | ICD-10-CM | POA: Diagnosis not present

## 2015-06-07 DIAGNOSIS — Z7982 Long term (current) use of aspirin: Secondary | ICD-10-CM | POA: Diagnosis not present

## 2015-06-07 DIAGNOSIS — M6281 Muscle weakness (generalized): Secondary | ICD-10-CM | POA: Diagnosis not present

## 2015-06-07 DIAGNOSIS — D631 Anemia in chronic kidney disease: Secondary | ICD-10-CM | POA: Diagnosis not present

## 2015-06-07 DIAGNOSIS — I130 Hypertensive heart and chronic kidney disease with heart failure and stage 1 through stage 4 chronic kidney disease, or unspecified chronic kidney disease: Secondary | ICD-10-CM | POA: Diagnosis not present

## 2015-06-07 DIAGNOSIS — I5030 Unspecified diastolic (congestive) heart failure: Secondary | ICD-10-CM | POA: Diagnosis not present

## 2015-06-07 DIAGNOSIS — M1611 Unilateral primary osteoarthritis, right hip: Secondary | ICD-10-CM | POA: Diagnosis not present

## 2015-06-07 DIAGNOSIS — Z87891 Personal history of nicotine dependence: Secondary | ICD-10-CM | POA: Diagnosis not present

## 2015-06-07 DIAGNOSIS — Z8701 Personal history of pneumonia (recurrent): Secondary | ICD-10-CM | POA: Diagnosis not present

## 2015-06-08 DIAGNOSIS — D631 Anemia in chronic kidney disease: Secondary | ICD-10-CM | POA: Diagnosis not present

## 2015-06-08 DIAGNOSIS — Z87891 Personal history of nicotine dependence: Secondary | ICD-10-CM | POA: Diagnosis not present

## 2015-06-08 DIAGNOSIS — M1711 Unilateral primary osteoarthritis, right knee: Secondary | ICD-10-CM | POA: Diagnosis not present

## 2015-06-08 DIAGNOSIS — Z7982 Long term (current) use of aspirin: Secondary | ICD-10-CM | POA: Diagnosis not present

## 2015-06-08 DIAGNOSIS — Z8701 Personal history of pneumonia (recurrent): Secondary | ICD-10-CM | POA: Diagnosis not present

## 2015-06-08 DIAGNOSIS — M1611 Unilateral primary osteoarthritis, right hip: Secondary | ICD-10-CM | POA: Diagnosis not present

## 2015-06-08 DIAGNOSIS — I130 Hypertensive heart and chronic kidney disease with heart failure and stage 1 through stage 4 chronic kidney disease, or unspecified chronic kidney disease: Secondary | ICD-10-CM | POA: Diagnosis not present

## 2015-06-08 DIAGNOSIS — I5030 Unspecified diastolic (congestive) heart failure: Secondary | ICD-10-CM | POA: Diagnosis not present

## 2015-06-08 DIAGNOSIS — Z9181 History of falling: Secondary | ICD-10-CM | POA: Diagnosis not present

## 2015-06-08 DIAGNOSIS — N183 Chronic kidney disease, stage 3 (moderate): Secondary | ICD-10-CM | POA: Diagnosis not present

## 2015-06-08 DIAGNOSIS — M6281 Muscle weakness (generalized): Secondary | ICD-10-CM | POA: Diagnosis not present

## 2015-06-12 DIAGNOSIS — M1711 Unilateral primary osteoarthritis, right knee: Secondary | ICD-10-CM | POA: Diagnosis not present

## 2015-06-12 DIAGNOSIS — M1611 Unilateral primary osteoarthritis, right hip: Secondary | ICD-10-CM | POA: Diagnosis not present

## 2015-06-12 DIAGNOSIS — I5030 Unspecified diastolic (congestive) heart failure: Secondary | ICD-10-CM | POA: Diagnosis not present

## 2015-06-12 DIAGNOSIS — Z9181 History of falling: Secondary | ICD-10-CM | POA: Diagnosis not present

## 2015-06-12 DIAGNOSIS — I130 Hypertensive heart and chronic kidney disease with heart failure and stage 1 through stage 4 chronic kidney disease, or unspecified chronic kidney disease: Secondary | ICD-10-CM | POA: Diagnosis not present

## 2015-06-12 DIAGNOSIS — Z8701 Personal history of pneumonia (recurrent): Secondary | ICD-10-CM | POA: Diagnosis not present

## 2015-06-12 DIAGNOSIS — N183 Chronic kidney disease, stage 3 (moderate): Secondary | ICD-10-CM | POA: Diagnosis not present

## 2015-06-12 DIAGNOSIS — M6281 Muscle weakness (generalized): Secondary | ICD-10-CM | POA: Diagnosis not present

## 2015-06-12 DIAGNOSIS — D631 Anemia in chronic kidney disease: Secondary | ICD-10-CM | POA: Diagnosis not present

## 2015-06-12 DIAGNOSIS — Z87891 Personal history of nicotine dependence: Secondary | ICD-10-CM | POA: Diagnosis not present

## 2015-06-12 DIAGNOSIS — Z7982 Long term (current) use of aspirin: Secondary | ICD-10-CM | POA: Diagnosis not present

## 2015-06-13 NOTE — Telephone Encounter (Signed)
Paper work has been received on 06/11/15

## 2015-06-14 DIAGNOSIS — M6281 Muscle weakness (generalized): Secondary | ICD-10-CM | POA: Diagnosis not present

## 2015-06-14 DIAGNOSIS — I130 Hypertensive heart and chronic kidney disease with heart failure and stage 1 through stage 4 chronic kidney disease, or unspecified chronic kidney disease: Secondary | ICD-10-CM | POA: Diagnosis not present

## 2015-06-14 DIAGNOSIS — Z8701 Personal history of pneumonia (recurrent): Secondary | ICD-10-CM | POA: Diagnosis not present

## 2015-06-14 DIAGNOSIS — D631 Anemia in chronic kidney disease: Secondary | ICD-10-CM | POA: Diagnosis not present

## 2015-06-14 DIAGNOSIS — Z87891 Personal history of nicotine dependence: Secondary | ICD-10-CM | POA: Diagnosis not present

## 2015-06-14 DIAGNOSIS — I5030 Unspecified diastolic (congestive) heart failure: Secondary | ICD-10-CM | POA: Diagnosis not present

## 2015-06-14 DIAGNOSIS — M1611 Unilateral primary osteoarthritis, right hip: Secondary | ICD-10-CM | POA: Diagnosis not present

## 2015-06-14 DIAGNOSIS — Z9181 History of falling: Secondary | ICD-10-CM | POA: Diagnosis not present

## 2015-06-14 DIAGNOSIS — Z7982 Long term (current) use of aspirin: Secondary | ICD-10-CM | POA: Diagnosis not present

## 2015-06-14 DIAGNOSIS — M1711 Unilateral primary osteoarthritis, right knee: Secondary | ICD-10-CM | POA: Diagnosis not present

## 2015-06-14 DIAGNOSIS — N183 Chronic kidney disease, stage 3 (moderate): Secondary | ICD-10-CM | POA: Diagnosis not present

## 2015-06-15 DIAGNOSIS — Z8701 Personal history of pneumonia (recurrent): Secondary | ICD-10-CM | POA: Diagnosis not present

## 2015-06-15 DIAGNOSIS — D631 Anemia in chronic kidney disease: Secondary | ICD-10-CM | POA: Diagnosis not present

## 2015-06-15 DIAGNOSIS — M1711 Unilateral primary osteoarthritis, right knee: Secondary | ICD-10-CM | POA: Diagnosis not present

## 2015-06-15 DIAGNOSIS — I130 Hypertensive heart and chronic kidney disease with heart failure and stage 1 through stage 4 chronic kidney disease, or unspecified chronic kidney disease: Secondary | ICD-10-CM | POA: Diagnosis not present

## 2015-06-15 DIAGNOSIS — Z7982 Long term (current) use of aspirin: Secondary | ICD-10-CM | POA: Diagnosis not present

## 2015-06-15 DIAGNOSIS — N183 Chronic kidney disease, stage 3 (moderate): Secondary | ICD-10-CM | POA: Diagnosis not present

## 2015-06-15 DIAGNOSIS — Z87891 Personal history of nicotine dependence: Secondary | ICD-10-CM | POA: Diagnosis not present

## 2015-06-15 DIAGNOSIS — Z9181 History of falling: Secondary | ICD-10-CM | POA: Diagnosis not present

## 2015-06-15 DIAGNOSIS — M1611 Unilateral primary osteoarthritis, right hip: Secondary | ICD-10-CM | POA: Diagnosis not present

## 2015-06-15 DIAGNOSIS — M6281 Muscle weakness (generalized): Secondary | ICD-10-CM | POA: Diagnosis not present

## 2015-06-15 DIAGNOSIS — I5030 Unspecified diastolic (congestive) heart failure: Secondary | ICD-10-CM | POA: Diagnosis not present

## 2015-06-18 DIAGNOSIS — Z8701 Personal history of pneumonia (recurrent): Secondary | ICD-10-CM | POA: Diagnosis not present

## 2015-06-18 DIAGNOSIS — M6281 Muscle weakness (generalized): Secondary | ICD-10-CM | POA: Diagnosis not present

## 2015-06-18 DIAGNOSIS — N183 Chronic kidney disease, stage 3 (moderate): Secondary | ICD-10-CM | POA: Diagnosis not present

## 2015-06-18 DIAGNOSIS — M1711 Unilateral primary osteoarthritis, right knee: Secondary | ICD-10-CM | POA: Diagnosis not present

## 2015-06-18 DIAGNOSIS — I130 Hypertensive heart and chronic kidney disease with heart failure and stage 1 through stage 4 chronic kidney disease, or unspecified chronic kidney disease: Secondary | ICD-10-CM | POA: Diagnosis not present

## 2015-06-18 DIAGNOSIS — Z87891 Personal history of nicotine dependence: Secondary | ICD-10-CM | POA: Diagnosis not present

## 2015-06-18 DIAGNOSIS — M1611 Unilateral primary osteoarthritis, right hip: Secondary | ICD-10-CM | POA: Diagnosis not present

## 2015-06-18 DIAGNOSIS — Z9181 History of falling: Secondary | ICD-10-CM | POA: Diagnosis not present

## 2015-06-18 DIAGNOSIS — I5030 Unspecified diastolic (congestive) heart failure: Secondary | ICD-10-CM | POA: Diagnosis not present

## 2015-06-18 DIAGNOSIS — D631 Anemia in chronic kidney disease: Secondary | ICD-10-CM | POA: Diagnosis not present

## 2015-06-18 DIAGNOSIS — Z7982 Long term (current) use of aspirin: Secondary | ICD-10-CM | POA: Diagnosis not present

## 2015-07-05 ENCOUNTER — Ambulatory Visit (INDEPENDENT_AMBULATORY_CARE_PROVIDER_SITE_OTHER): Payer: Medicare Other | Admitting: Family Medicine

## 2015-07-05 ENCOUNTER — Encounter: Payer: Self-pay | Admitting: Internal Medicine

## 2015-07-05 ENCOUNTER — Encounter: Payer: Self-pay | Admitting: Gastroenterology

## 2015-07-05 ENCOUNTER — Encounter: Payer: Self-pay | Admitting: Family Medicine

## 2015-07-05 VITALS — BP 128/70 | HR 98 | Temp 98.6°F

## 2015-07-05 DIAGNOSIS — J9601 Acute respiratory failure with hypoxia: Secondary | ICD-10-CM

## 2015-07-05 DIAGNOSIS — F32A Depression, unspecified: Secondary | ICD-10-CM

## 2015-07-05 DIAGNOSIS — Z23 Encounter for immunization: Secondary | ICD-10-CM

## 2015-07-05 DIAGNOSIS — F329 Major depressive disorder, single episode, unspecified: Secondary | ICD-10-CM

## 2015-07-05 DIAGNOSIS — F325 Major depressive disorder, single episode, in full remission: Secondary | ICD-10-CM | POA: Insufficient documentation

## 2015-07-05 MED ORDER — SERTRALINE HCL 25 MG PO TABS: 25.0000 mg | ORAL_TABLET | Freq: Every day | ORAL | Status: DC

## 2015-07-05 NOTE — Patient Instructions (Addendum)
Flu shot received.  You and your wife have been through a great deal. I want to try to support you through some of your frustrations with the change by starting zoloft 25 mg ( a really low dose to help you deal with you feeling down and frustrated at times)  I want to see you in about 4 weeks to check in- we may need to increase to a higher dose     Taking the medicine as directed and not missing any doses is one of the best things you can do to treat your depression.  Here are some things to keep in mind:  1) Side effects (stomach upset, some increased anxiety) may happen before you notice a benefit.  These side effects typically go away over time. 2) Changes to your dose of medicine or a change in medication all together is sometimes necessary 3) Most people need to be on medication at least 6-12 months 4) Many people will notice an improvement within two weeks but the full effect of the medication can take up to 4-6 weeks 5) Stopping the medication when you start feeling better often results in a return of symptoms 6) If you start having thoughts of hurting yourself or others after starting this medicine, please call me at [phone] immediately.

## 2015-07-05 NOTE — Assessment & Plan Note (Signed)
S: I saw patient in July and started on levaquin for CAP. He had progressive decline in dyspnea and in ED had sats to 80s on RA and mid 90s on 4L [place]. Had elevated D- dimer and underwent vq scan which was low probability for PE. Changed to doxycycline and put on prednisone for wheezy cough. During hospitalization PT/OT recommended SNF given weakness and issues with falls. He stayed at [facility] for some time then transitioned back home in recent weeks.  A/P: overall doing well. Hypoxia has resolved. PNA has now cleared. His and his wife's strength is much worse after his PNA and her CVA though so major changes in their life and function

## 2015-07-05 NOTE — Progress Notes (Signed)
Garret Reddish, MD  Subjective:  Roger [name] is a 81 y.o. year old very pleasant male patient who presents for/with See problem oriented charting ROS- No chest pain or shortness of breath at rest. No headache or blurry vision. Does report some weakness but no recent falls.   Past Medical History-  Patient Active Problem List   Diagnosis Date Noted  . Acute respiratory failure with hypoxia ([place]) 03/10/2015    Priority: High  . Depression 07/05/2015    Priority: Medium  . CKD (chronic kidney disease), stage III 07/13/2014    Priority: Medium  . Overactive bladder 07/13/2014    Priority: Medium  . Syncope 11/17/2013    Priority: Medium  . Hypertension 06/17/2007    Priority: Medium  . Hyperlipidemia 02/22/2007    Priority: Medium  . Venous (peripheral) insufficiency 01/26/2015    Priority: Low  . Macular degeneration 06/19/2011    Priority: Low  . Osteoarthritis 06/05/2009    Priority: Low  . CARCINOMA, SKIN, SQUAMOUS CELL 05/26/2008    Priority: Low  . GERD (gastroesophageal reflux disease) 02/22/2007    Priority: Low  . Chronic throat clearing 10/11/2014    Medications- reviewed and updated Current Outpatient Prescriptions  Medication Sig Dispense Refill  . aspirin (ECOTRIN) 325 MG EC tablet Take 325 mg by mouth daily.    . cetirizine (ZYRTEC) 10 MG tablet TAKE 1 TABLET BY MOUTH EVERY DAY 30 tablet 8  . fluticasone (FLONASE) 50 MCG/ACT nasal spray Place 2 sprays into both nostrils daily. 16 g 6  . furosemide (LASIX) 20 MG tablet Take 1 tablet (20 mg total) by mouth daily. 90 tablet 1  . guaiFENesin (MUCINEX) 600 MG 12 hr tablet Take 2 tablets (1,200 mg total) by mouth 2 (two) times daily. 60 tablet 0  . lidocaine (LIDODERM) 5 % Place 1 patch onto the skin daily. Remove & Discard patch within 12 hours or as directed by MD 30 patch 0  . losartan (COZAAR) 50 MG tablet Take 50 mg by mouth daily.    . Multiple Vitamins-Minerals (MULTIVITAMIN WITH MINERALS) tablet Take 1  tablet by mouth daily.    [name] omeprazole (PRILOSEC) 20 MG capsule Take 1 capsule (20 mg total) by mouth 2 (two) times daily before a meal. 180 capsule 3  . oxybutynin (DITROPAN) 5 MG tablet Take 0.5 tablets (2.5 mg total) by mouth daily. 45 tablet 3  . UNABLE TO FIND Med Name: Med pass 120 mL by mouth twice daily for supplement    . sertraline (ZOLOFT) 25 MG tablet Take 1 tablet (25 mg total) by mouth daily. 30 tablet 2   No current facility-administered medications for this visit.    Objective: BP 128/70 mmHg  Pulse 98  Temp(Src) 98.6 F (37 C) Gen: NAD, resting comfortably CV: RRR no murmurs rubs or gallops Lungs: CTAB no crackles, wheeze, rhonchi Abdomen: soft/nontender/nondistended/normal bowel sounds. No rebound or guarding.  Ext: 1+ edema despite compression stockings Skin: warm, dry Neuro: grossly normal but does have generalized weakness and is hard of hearing, moves all extremities, in Wheelchair   Assessment/Plan:  Acute respiratory failure with hypoxia ([place]) S: I saw patient in July and started on levaquin for CAP. He had progressive decline in dyspnea and in ED had sats to 80s on RA and mid 90s on 4L [place]. Had elevated D- dimer and underwent vq scan which was low probability for PE. Changed to doxycycline and put on prednisone for wheezy cough. During hospitalization PT/OT recommended SNF given  weakness and issues with falls. He stayed at [facility] for some time then transitioned back home in recent weeks.  A/P: overall doing well. Hypoxia has resolved. PNA has now cleared. His and his wife's strength is much worse after his PNA and her CVA though so major changes in their life and function   Depression S: changes in function have been hard on patient and wife. He admits he is down about this and seems to get easily agitated. Spoke to wife yesterday who reports he yells and is very demanding of her- though she feels safe with him A/P: Patien tadmits to issue- agreeable to  trial low dose zoloft with 4 week repeat eval (or 6) and then adjust up if needed. Daughter very helpful in matter   Return precautions advised.   Orders Placed This Encounter  Procedures  . Flu Vaccine QUAD 36+ mos IM    Meds ordered this encounter  Medications  . sertraline (ZOLOFT) 25 MG tablet    Sig: Take 1 tablet (25 mg total) by mouth daily.    Dispense:  30 tablet    Refill:  2   >50% of 30 minute office visit was spent on counseling (changes in his and his wife's life after recent illnesses) and coordination of care

## 2015-07-05 NOTE — Assessment & Plan Note (Signed)
S: changes in function have been hard on patient and wife. He admits he is down about this and seems to get easily agitated. Spoke to wife yesterday who reports he yells and is very demanding of her- though she feels safe with him A/P: Patien tadmits to issue- agreeable to trial low dose zoloft with 4 week repeat eval (or 6) and then adjust up if needed. Daughter very helpful in matter

## 2015-07-06 ENCOUNTER — Ambulatory Visit: Payer: Medicare Other | Admitting: Family Medicine

## 2015-07-11 ENCOUNTER — Other Ambulatory Visit: Payer: Self-pay | Admitting: Family Medicine

## 2015-07-31 ENCOUNTER — Telehealth: Payer: Self-pay

## 2015-07-31 NOTE — Telephone Encounter (Signed)
Pt daughter states pt dad went into a big tantrum with yelling and screaming and being irrational and they could not calm him down, she had to call the police as a last result and this behavior has gotten worse since being put on Zoloft. Pt daughter is unsure of what to do next. Please advise.

## 2015-07-31 NOTE — Telephone Encounter (Signed)
Pt daughter notified of below recommendations and will keep [place] updated on Roger [name]'s behavior.

## 2015-07-31 NOTE — Telephone Encounter (Signed)
If can split tablet- have him take 1/2 tab daily for next week then stop. If cannot split then simply stop and let's reassess within 2 weeks. May be best to just keep him off additional medicine and consider counseling instead but I know that would be tough for him getting in and out of house.

## 2015-09-14 ENCOUNTER — Other Ambulatory Visit: Payer: Self-pay | Admitting: Family Medicine

## 2015-09-17 ENCOUNTER — Other Ambulatory Visit: Payer: Self-pay | Admitting: Family Medicine

## 2015-09-25 ENCOUNTER — Other Ambulatory Visit: Payer: Self-pay | Admitting: Family Medicine

## 2015-09-25 MED ORDER — FUROSEMIDE 20 MG PO TABS: 20.0000 mg | ORAL_TABLET | Freq: Every day | ORAL | Status: DC

## 2015-10-11 ENCOUNTER — Ambulatory Visit: Payer: Medicare Other | Admitting: Family Medicine

## 2015-10-21 ENCOUNTER — Other Ambulatory Visit: Payer: Self-pay | Admitting: Family Medicine

## 2015-10-22 ENCOUNTER — Ambulatory Visit: Payer: Medicare Other | Admitting: Family Medicine

## 2015-10-23 ENCOUNTER — Encounter: Payer: Self-pay | Admitting: Family Medicine

## 2015-10-23 ENCOUNTER — Ambulatory Visit (INDEPENDENT_AMBULATORY_CARE_PROVIDER_SITE_OTHER): Payer: Medicare Other | Admitting: Family Medicine

## 2015-10-23 VITALS — BP 110/70 | HR 100 | Temp 97.8°F | Wt 158.0 lb

## 2015-10-23 DIAGNOSIS — R6889 Other general symptoms and signs: Secondary | ICD-10-CM

## 2015-10-23 DIAGNOSIS — N183 Chronic kidney disease, stage 3 unspecified: Secondary | ICD-10-CM

## 2015-10-23 DIAGNOSIS — N3281 Overactive bladder: Secondary | ICD-10-CM | POA: Diagnosis not present

## 2015-10-23 DIAGNOSIS — R600 Localized edema: Secondary | ICD-10-CM | POA: Diagnosis not present

## 2015-10-23 DIAGNOSIS — I872 Venous insufficiency (chronic) (peripheral): Secondary | ICD-10-CM

## 2015-10-23 DIAGNOSIS — R198 Other specified symptoms and signs involving the digestive system and abdomen: Secondary | ICD-10-CM

## 2015-10-23 DIAGNOSIS — R0989 Other specified symptoms and signs involving the circulatory and respiratory systems: Secondary | ICD-10-CM

## 2015-10-23 NOTE — Patient Instructions (Signed)
I would like to stop the fuorsemide/lasix and the oxybutynin both  Before we stop the lasix, want to see how the heart is pumping to get a better feel for if this is safe  For phlegm issues- let's trial 6-8 oz water with each time you take the mucinex for now. We can consider allergist if not improved enough within a month or so (we may wait a full month after stopping the medicines listed above if we are able to stop them)

## 2015-10-23 NOTE — Progress Notes (Signed)
Garret Reddish, MD  Subjective:  Roger [name] is a 82 y.o. year old very pleasant male patient who presents for/with See problem oriented charting ROS- no fever, chills, nausea, vomiting, choking. No difficulty swallowing.   Past Medical History-  Patient Active Problem List   Diagnosis Date Noted  . Acute respiratory failure with hypoxia ([place]) 03/10/2015    Priority: High  . Depression 07/05/2015    Priority: Medium  . CKD (chronic kidney disease), stage III 07/13/2014    Priority: Medium  . Overactive bladder 07/13/2014    Priority: Medium  . Syncope 11/17/2013    Priority: Medium  . Hypertension 06/17/2007    Priority: Medium  . Hyperlipidemia 02/22/2007    Priority: Medium  . Venous (peripheral) insufficiency 01/26/2015    Priority: Low  . Macular degeneration 06/19/2011    Priority: Low  . Osteoarthritis 06/05/2009    Priority: Low  . CARCINOMA, SKIN, SQUAMOUS CELL 05/26/2008    Priority: Low  . GERD (gastroesophageal reflux disease) 02/22/2007    Priority: Low  . Chronic throat clearing 10/11/2014    Medications- reviewed and updated Current Outpatient Prescriptions  Medication Sig Dispense Refill  . aspirin (ECOTRIN) 325 MG EC tablet Take 325 mg by mouth daily.    . cetirizine (ZYRTEC) 10 MG tablet TAKE 1 TABLET BY MOUTH EVERY DAY 30 tablet 8  . fluticasone (FLONASE) 50 MCG/ACT nasal spray Place 2 sprays into both nostrils daily. 16 g 6  . furosemide (LASIX) 20 MG tablet Take 1 tablet (20 mg total) by mouth daily. 90 tablet 1  . guaiFENesin (MUCINEX) 600 MG 12 hr tablet Take 2 tablets (1,200 mg total) by mouth 2 (two) times daily. 60 tablet 0  . losartan (COZAAR) 50 MG tablet Take 50 mg by mouth daily.    [name] lovastatin (MEVACOR) 40 MG tablet Take 40 mg by mouth at bedtime.    . Multiple Vitamins-Minerals (MULTIVITAMIN WITH MINERALS) tablet Take 1 tablet by mouth daily.    [name] omeprazole (PRILOSEC) 20 MG capsule TAKE ONE CAPSULE BY MOUTH TWICE A DAY BEFORE A MEAL  180 capsule 2  . oxybutynin (DITROPAN) 5 MG tablet Take 0.5 tablets (2.5 mg total) by mouth daily. 45 tablet 3  . sertraline (ZOLOFT) 25 MG tablet TAKE 1 TABLET BY MOUTH EVERY DAY 30 tablet 5  . UNABLE TO FIND Med Name: Med pass 120 mL by mouth twice daily for supplement    . [DISCONTINUED] diltiazem (CARDIZEM CD) 180 MG 24 hr capsule Take 180 mg by mouth daily.     . [DISCONTINUED] hydrochlorothiazide (MICROZIDE) 12.5 MG capsule Take 1 capsule (12.5 mg total) by mouth daily. 30 capsule 11   No current facility-administered medications for this visit.    Objective: BP 110/70 mmHg  Pulse 100  Temp(Src) 97.8 F (36.6 C)  Wt 158 lb (71.668 kg)  SpO2 95% Gen: NAD, resting comfortably Oropharynx mildly dry- otherwise normal CV: RRR no murmurs rubs or gallops (HR in 90s on exam) Lungs: CTAB no crackles, wheeze, rhonchi Abdomen: soft/nontender/nondistended/normal bowel sounds. Ext: trace edema despite compression stockings Skin: warm, dry Neuro: grossly normal, moves all extremities  Assessment/Plan:  Chronic throat clearing S: Patient continues to complain of phlegm in his throat and coming from his sinuses. He is compliant with flonase and zyrtec. He also takes mucinex 1-2x a day. These measures help some but never completely clear issue.  A/P: I suspect some of thickness of secretions is poor hydration status. The lasix he is on  certainly contributes. He only drinks about 1/2 a water bottle a day then 1-2 beers in a day. Taking mucinex this could certainly overdry him. Also oxybutynin could cause some mouth dryness. In short term, decided to push fluids to 6-8 oz with each BID dose of mucinex. Patient wants to consider allergist referral- previously considered ENT- he may call if he opts for this before attempted changes of sopping oxybutynin and lasix.   Localized edema with CKD (chronic kidney disease), stage III and Venous (peripheral) insufficiency. Also with known Overactive  bladder S: patient has been on daily lasix for sometime. Sometimes takes BID for edema. Overactive bladder is too bothersome when off oxybutynin but on lasix A/P: this is likely multifactorial including CKD III and venous insufficiency. Considering stopping lasix and then wonder if oxybutynin could be stopped for above issue. Want to rule out heart failure before making such a change so will get echocardiogram  Return precautions advised.   Orders Placed This Encounter  Procedures  . ECHOCARDIOGRAM COMPLETE    Standing Status: Future     Number of Occurrences:      Standing Expiration Date: 01/22/2017    Scheduling Instructions:     Long term edema. Has been on lasix prolonged period. Considering stopping but need to make sure heart failure not contributing    Order Specific Question:  Where should this test be performed    Answer:  [facility]    Order Specific Question:  Complete or Limited study?    Answer:  Complete    Order Specific Question:  With Image Enhancing Agent or without Image Enhancing Agent?    Answer:  With Image Enhancing Agent    Order Specific Question:  Reason for exam-Echo    Answer:  Other - See Comments Section

## 2015-10-30 ENCOUNTER — Other Ambulatory Visit: Payer: Self-pay | Admitting: Family Medicine

## 2015-11-13 ENCOUNTER — Other Ambulatory Visit (HOSPITAL_COMMUNITY): Payer: Medicare Other

## 2015-11-20 ENCOUNTER — Ambulatory Visit (HOSPITAL_COMMUNITY): Payer: Medicare Other

## 2015-12-04 ENCOUNTER — Ambulatory Visit (HOSPITAL_COMMUNITY): Payer: Medicare Other

## 2015-12-24 ENCOUNTER — Ambulatory Visit (HOSPITAL_COMMUNITY): Payer: Medicare Other | Attending: Cardiovascular Disease

## 2015-12-25 ENCOUNTER — Telehealth (HOSPITAL_COMMUNITY): Payer: Self-pay | Admitting: Radiology

## 2015-12-25 NOTE — Telephone Encounter (Signed)
Patient called back. He would like to hold off on the echo until he speaks with Dr. Yong Channel

## 2015-12-25 NOTE — Telephone Encounter (Signed)
Called patient to reschedule echocardiogram.  LMOM with wife.

## 2016-01-02 ENCOUNTER — Other Ambulatory Visit: Payer: Self-pay | Admitting: Family Medicine

## 2016-01-15 ENCOUNTER — Other Ambulatory Visit: Payer: Self-pay

## 2016-01-15 ENCOUNTER — Ambulatory Visit (INDEPENDENT_AMBULATORY_CARE_PROVIDER_SITE_OTHER): Payer: Medicare Other | Admitting: Family Medicine

## 2016-01-15 ENCOUNTER — Ambulatory Visit (HOSPITAL_COMMUNITY): Payer: Medicare Other | Attending: Family Medicine

## 2016-01-15 ENCOUNTER — Encounter: Payer: Self-pay | Admitting: Family Medicine

## 2016-01-15 VITALS — BP 118/72 | HR 92 | Wt 167.0 lb

## 2016-01-15 DIAGNOSIS — I1310 Hypertensive heart and chronic kidney disease without heart failure, with stage 1 through stage 4 chronic kidney disease, or unspecified chronic kidney disease: Secondary | ICD-10-CM | POA: Insufficient documentation

## 2016-01-15 DIAGNOSIS — H6123 Impacted cerumen, bilateral: Secondary | ICD-10-CM

## 2016-01-15 DIAGNOSIS — H9193 Unspecified hearing loss, bilateral: Secondary | ICD-10-CM | POA: Diagnosis not present

## 2016-01-15 DIAGNOSIS — R600 Localized edema: Secondary | ICD-10-CM | POA: Diagnosis not present

## 2016-01-15 DIAGNOSIS — I77810 Thoracic aortic ectasia: Secondary | ICD-10-CM | POA: Insufficient documentation

## 2016-01-15 DIAGNOSIS — N189 Chronic kidney disease, unspecified: Secondary | ICD-10-CM | POA: Insufficient documentation

## 2016-01-15 DIAGNOSIS — I351 Nonrheumatic aortic (valve) insufficiency: Secondary | ICD-10-CM | POA: Insufficient documentation

## 2016-01-15 DIAGNOSIS — E785 Hyperlipidemia, unspecified: Secondary | ICD-10-CM | POA: Insufficient documentation

## 2016-01-15 DIAGNOSIS — I059 Rheumatic mitral valve disease, unspecified: Secondary | ICD-10-CM | POA: Insufficient documentation

## 2016-01-15 NOTE — Progress Notes (Signed)
Subjective:  CASHEL OSKEY is a 82 y.o. year old very pleasant male patient who presents for/with See problem oriented charting ROS- complains of ear fullness and worsening hearing loss. No facial pain. No fever or chills. no ear pain.see any ROS included in HPI as well.   Past Medical History-  Patient Active Problem List   Diagnosis Date Noted  . Acute respiratory failure with hypoxia ([place]) 03/10/2015    Priority: High  . Depression 07/05/2015    Priority: Medium  . CKD (chronic kidney disease), stage III 07/13/2014    Priority: Medium  . Overactive bladder 07/13/2014    Priority: Medium  . Syncope 11/17/2013    Priority: Medium  . Hypertension 06/17/2007    Priority: Medium  . Hyperlipidemia 02/22/2007    Priority: Medium  . Venous (peripheral) insufficiency 01/26/2015    Priority: Low  . Macular degeneration 06/19/2011    Priority: Low  . Osteoarthritis 06/05/2009    Priority: Low  . CARCINOMA, SKIN, SQUAMOUS CELL 05/26/2008    Priority: Low  . GERD (gastroesophageal reflux disease) 02/22/2007    Priority: Low  . Chronic throat clearing 10/11/2014    Medications- reviewed and updated Current Outpatient Prescriptions  Medication Sig Dispense Refill  . aspirin (ECOTRIN) 325 MG EC tablet Take 325 mg by mouth daily.    . cetirizine (ZYRTEC) 10 MG tablet TAKE 1 TABLET BY MOUTH EVERY DAY 30 tablet 8  . fluticasone (FLONASE) 50 MCG/ACT nasal spray Place 2 sprays into both nostrils daily. 16 g 6  . furosemide (LASIX) 20 MG tablet Take 1 tablet (20 mg total) by mouth daily. 90 tablet 1  . guaiFENesin (MUCINEX) 600 MG 12 hr tablet Take 2 tablets (1,200 mg total) by mouth 2 (two) times daily. 60 tablet 0  . losartan (COZAAR) 100 MG tablet TAKE 1 TABLET BY MOUTH EVERY DAY 90 tablet 3  . lovastatin (MEVACOR) 40 MG tablet TAKE 1 TABLET BY MOUTH EVERY DAY 90 tablet 2  . Multiple Vitamins-Minerals (MULTIVITAMIN WITH MINERALS) tablet Take 1 tablet by mouth daily.    [name] omeprazole  (PRILOSEC) 20 MG capsule TAKE ONE CAPSULE BY MOUTH TWICE A DAY BEFORE A MEAL 180 capsule 2  . oxybutynin (DITROPAN) 5 MG tablet Take 0.5 tablets (2.5 mg total) by mouth daily. 45 tablet 3  . sertraline (ZOLOFT) 25 MG tablet TAKE 1 TABLET BY MOUTH EVERY DAY 30 tablet 5  . UNABLE TO FIND Med Name: Med pass 120 mL by mouth twice daily for supplement    . [DISCONTINUED] diltiazem (CARDIZEM CD) 180 MG 24 hr capsule Take 180 mg by mouth daily.     . [DISCONTINUED] hydrochlorothiazide (MICROZIDE) 12.5 MG capsule Take 1 capsule (12.5 mg total) by mouth daily. 30 capsule 11   No current facility-administered medications for this visit.    Objective: BP 118/72 mmHg  Pulse 92  Wt 167 lb (75.751 kg)  SpO2 93% Gen: NAD, resting comfortably Oropharynx normal. Bilateral cerumen impactions. Irrigated bilaterally. Large portions of cerumen were removed but TM still not visible at end of lavage.  CV: RRR no murmurs rubs or gallops Lungs: CTAB no crackles, wheeze, rhonchi Ext: trace edema under compression stockings Skin: warm, dry Neuro: needs almost full assist to get onto table, walks with walker  Assessment/Plan:  Hearing loss (worse than normal) Cerumen impaction S: went to audiologist in Dr. Cresenciano Lick (ENT office) recently and was told had cerumen impaction affecting hearing and needed removed. Dr. Cresenciano Lick was not available for a  few weeks and family wanted to try sooner due to hearing complaings (already hard of hearing) so brought in today. No treatments have been tried. Patient states last time cleaned out was over 6 years ago A/P: Bilateral lavage produced large volume wax removal but deeper wax still remained and patient did not wish to proceed with further lavage. For that reason, lavage stopped- patient did state things sounded somewhat louder to him fortunately. He is going to have further follow up with ENT.   Return precautions advised.   Orders Placed This Encounter  Procedures  . Ear  Lavage   Garret Reddish, MD

## 2016-01-15 NOTE — Patient Instructions (Signed)
Patient to return to ENT for further treatment- large portion of wax removed but TM still not visible

## 2016-02-05 ENCOUNTER — Encounter: Payer: Self-pay | Admitting: Family Medicine

## 2016-02-05 ENCOUNTER — Ambulatory Visit (INDEPENDENT_AMBULATORY_CARE_PROVIDER_SITE_OTHER): Payer: Medicare Other | Admitting: Family Medicine

## 2016-02-05 VITALS — BP 140/88 | HR 90 | Temp 98.0°F | Ht 68.5 in | Wt 169.0 lb

## 2016-02-05 DIAGNOSIS — R198 Other specified symptoms and signs involving the digestive system and abdomen: Secondary | ICD-10-CM

## 2016-02-05 DIAGNOSIS — I5032 Chronic diastolic (congestive) heart failure: Secondary | ICD-10-CM | POA: Diagnosis not present

## 2016-02-05 DIAGNOSIS — R0981 Nasal congestion: Secondary | ICD-10-CM

## 2016-02-05 DIAGNOSIS — I5030 Unspecified diastolic (congestive) heart failure: Secondary | ICD-10-CM | POA: Insufficient documentation

## 2016-02-05 DIAGNOSIS — I5189 Other ill-defined heart diseases: Secondary | ICD-10-CM | POA: Insufficient documentation

## 2016-02-05 DIAGNOSIS — R0989 Other specified symptoms and signs involving the circulatory and respiratory systems: Secondary | ICD-10-CM

## 2016-02-05 DIAGNOSIS — R6889 Other general symptoms and signs: Secondary | ICD-10-CM

## 2016-02-05 NOTE — Progress Notes (Signed)
Subjective:  Roger [name] is a 82 y.o. year old very pleasant male patient who presents for/with See problem oriented charting ROS- no fever, chills, nausea. Does get full throat sensation with phlegm.see any ROS included in HPI as well.   Past Medical History-  Patient Active Problem List   Diagnosis Date Noted  . Diastolic CHF ([place]) Q000111Q    Priority: High  . Acute respiratory failure with hypoxia ([place]) 03/10/2015    Priority: High  . Depression 07/05/2015    Priority: Medium  . Chronic throat clearing 10/11/2014    Priority: Medium  . CKD (chronic kidney disease), stage III 07/13/2014    Priority: Medium  . Overactive bladder 07/13/2014    Priority: Medium  . Syncope 11/17/2013    Priority: Medium  . Hypertension 06/17/2007    Priority: Medium  . Hyperlipidemia 02/22/2007    Priority: Medium  . Venous (peripheral) insufficiency 01/26/2015    Priority: Low  . Macular degeneration 06/19/2011    Priority: Low  . Osteoarthritis 06/05/2009    Priority: Low  . CARCINOMA, SKIN, SQUAMOUS CELL 05/26/2008    Priority: Low  . GERD (gastroesophageal reflux disease) 02/22/2007    Priority: Low    Medications- reviewed and updated Current Outpatient Prescriptions  Medication Sig Dispense Refill  . aspirin (ECOTRIN) 325 MG EC tablet Take 325 mg by mouth daily.    . cetirizine (ZYRTEC) 10 MG tablet TAKE 1 TABLET BY MOUTH EVERY DAY 30 tablet 8  . furosemide (LASIX) 20 MG tablet Take 1 tablet (20 mg total) by mouth daily. 90 tablet 1  . guaiFENesin (MUCINEX) 600 MG 12 hr tablet Take 2 tablets (1,200 mg total) by mouth 2 (two) times daily. 60 tablet 0  . losartan (COZAAR) 100 MG tablet TAKE 1 TABLET BY MOUTH EVERY DAY 90 tablet 3  . lovastatin (MEVACOR) 40 MG tablet TAKE 1 TABLET BY MOUTH EVERY DAY 90 tablet 2  . Multiple Vitamins-Minerals (MULTIVITAMIN WITH MINERALS) tablet Take 1 tablet by mouth daily.    [name] omeprazole (PRILOSEC) 20 MG capsule TAKE ONE CAPSULE BY MOUTH TWICE A  DAY BEFORE A MEAL 180 capsule 2  . oxybutynin (DITROPAN) 5 MG tablet Take 0.5 tablets (2.5 mg total) by mouth daily. 45 tablet 3  . sertraline (ZOLOFT) 25 MG tablet TAKE 1 TABLET BY MOUTH EVERY DAY 30 tablet 5  . UNABLE TO FIND Med Name: Med pass 120 mL by mouth twice daily for supplement    . fluticasone (FLONASE) 50 MCG/ACT nasal spray Place 2 sprays into both nostrils daily. (Patient not taking: Reported on 02/05/2016) 16 g 6  . [DISCONTINUED] diltiazem (CARDIZEM CD) 180 MG 24 hr capsule Take 180 mg by mouth daily.     . [DISCONTINUED] hydrochlorothiazide (MICROZIDE) 12.5 MG capsule Take 1 capsule (12.5 mg total) by mouth daily. 30 capsule 11   No current facility-administered medications for this visit.    Objective: BP 140/88 mmHg  Pulse 90  Temp(Src) 98 F (36.7 C) (Oral)  Ht 5' 8.5" (1.74 m)  Wt 169 lb (76.658 kg)  BMI 25.32 kg/m2  SpO2 94% Gen: NAD, resting comfortably CV: RRR no murmurs rubs or gallops Lungs: CTAB no crackles, wheeze, rhonchi Abdomen: soft/nontender/nondistended/normal bowel sounds. No rebound or guarding.  Ext: 2+ edema Skin: warm, dry Neuro: grossly normal, moves all extremities  Assessment/Plan:  Chronic throat clearing S: chronic issues for years. He has coughing fits and then coughs up large volume of sputum sometimes clear, sometimes white, sometimes green  or yellow. We have trialed BID PPI in past with about 50% improvement but then worsened again and now back to once a day PPI. We tried zyrtec and flonase without relief. Not on ace-i. No history of asthma. Had tried multiple therapies with prior PCP in past as well. Sometimes he thinks he will choke on volume of phlegm. He also now states left nare - he cannot blow out of and recognizes this has been going on for some time now.  A/P: Chronic issues of throat clearing, large volume drainage and then coughing up volume of sputum. Tried therapy for GERD, allergies. No asthma and not on ace-i. We will  refer to ENT at this point.   Diastolic CHF ([place]) S: stable on lasix 20mg - chronic 2+ edema. No orthopnea or PND. No increased SOB with activity.  A/P: reviewed prior echo results, will continue lasix 20mg  daily.    Orders Placed This Encounter  Procedures  . Ambulatory referral to ENT    Referral Priority:  Routine    Referral Type:  Consultation    Referral Reason:  Specialty Services Required    Requested Specialty:  Otolaryngology    Number of Visits Requested:  1   The duration of face-to-face time during this visit was 15 minutes. Greater than 50% of this time was spent in counseling, explanation of diagnosis, planning of further management, and/or coordination of care.    Return precautions advised.  Garret Reddish, MD

## 2016-02-05 NOTE — Progress Notes (Signed)
Pre visit review using our clinic review tool, if applicable. No additional management support is needed unless otherwise documented below in the visit note. 

## 2016-02-05 NOTE — Patient Instructions (Signed)
We will call you within a week about your referral to Ear, nose, and throat doctors for your chronic throat clearing and sinus congestion. If you do not hear within 2 weeks, give [place] a call.   If you want a knee injection- happy to do that for you

## 2016-02-05 NOTE — Assessment & Plan Note (Signed)
S: stable on lasix 20mg - chronic 2+ edema. No orthopnea or PND. No increased SOB with activity.  A/P: reviewed prior echo results, will continue lasix 20mg  daily.

## 2016-02-05 NOTE — Assessment & Plan Note (Signed)
S: chronic issues for years. He has coughing fits and then coughs up large volume of sputum sometimes clear, sometimes white, sometimes green or yellow. We have trialed BID PPI in past with about 50% improvement but then worsened again and now back to once a day PPI. We tried zyrtec and flonase without relief. Not on ace-i. No history of asthma. Had tried multiple therapies with prior PCP in past as well. Sometimes he thinks he will choke on volume of phlegm. He also now states left nare - he cannot blow out of and recognizes this has been going on for some time now.  A/P: Chronic issues of throat clearing, large volume drainage and then coughing up volume of sputum. Tried therapy for GERD, allergies. No asthma and not on ace-i. We will refer to ENT at this point.

## 2016-02-13 DIAGNOSIS — J310 Chronic rhinitis: Secondary | ICD-10-CM | POA: Diagnosis not present

## 2016-02-13 DIAGNOSIS — J342 Deviated nasal septum: Secondary | ICD-10-CM | POA: Diagnosis not present

## 2016-03-27 DIAGNOSIS — H6123 Impacted cerumen, bilateral: Secondary | ICD-10-CM | POA: Diagnosis not present

## 2016-03-27 DIAGNOSIS — H903 Sensorineural hearing loss, bilateral: Secondary | ICD-10-CM | POA: Diagnosis not present

## 2016-05-15 ENCOUNTER — Ambulatory Visit (INDEPENDENT_AMBULATORY_CARE_PROVIDER_SITE_OTHER): Payer: Medicare Other | Admitting: Family Medicine

## 2016-05-15 ENCOUNTER — Encounter: Payer: Self-pay | Admitting: Family Medicine

## 2016-05-15 VITALS — BP 128/78 | HR 85 | Temp 97.6°F | Ht 68.5 in | Wt 171.5 lb

## 2016-05-15 DIAGNOSIS — E785 Hyperlipidemia, unspecified: Secondary | ICD-10-CM | POA: Diagnosis not present

## 2016-05-15 DIAGNOSIS — R6889 Other general symptoms and signs: Secondary | ICD-10-CM

## 2016-05-15 DIAGNOSIS — M150 Primary generalized (osteo)arthritis: Secondary | ICD-10-CM | POA: Diagnosis not present

## 2016-05-15 DIAGNOSIS — M1711 Unilateral primary osteoarthritis, right knee: Secondary | ICD-10-CM

## 2016-05-15 DIAGNOSIS — R0989 Other specified symptoms and signs involving the circulatory and respiratory systems: Secondary | ICD-10-CM

## 2016-05-15 DIAGNOSIS — I10 Essential (primary) hypertension: Secondary | ICD-10-CM | POA: Diagnosis not present

## 2016-05-15 DIAGNOSIS — I5032 Chronic diastolic (congestive) heart failure: Secondary | ICD-10-CM

## 2016-05-15 DIAGNOSIS — M159 Polyosteoarthritis, unspecified: Secondary | ICD-10-CM

## 2016-05-15 DIAGNOSIS — R198 Other specified symptoms and signs involving the digestive system and abdomen: Secondary | ICD-10-CM

## 2016-05-15 MED ORDER — METHYLPREDNISOLONE ACETATE 80 MG/ML IJ SUSP
80.0000 mg | Freq: Once | INTRAMUSCULAR | Status: AC
  Administered 2016-05-15: 80 mg via INTRAMUSCULAR

## 2016-05-15 NOTE — Assessment & Plan Note (Signed)
S: largely stable on lasix 20mg  though weight slightly up. No SOB A/P: continue lasix- likely some venous insufficiency as well- advise elevate legs

## 2016-05-15 NOTE — Progress Notes (Signed)
Pre visit review using our clinic review tool, if applicable. No additional management support is needed unless otherwise documented below in the visit note. 

## 2016-05-15 NOTE — Assessment & Plan Note (Signed)
S: controlled on losartan 100mg , lasix 20mg  BP Readings from Last 3 Encounters:  05/15/16 128/78  02/05/16 140/88  01/15/16 118/72  A/P:Continue current medications

## 2016-05-15 NOTE — Assessment & Plan Note (Addendum)
R knee osteoarthritis- poor control. Spending more time in chair due to knee and getting harder to be mobile trouble getting up stairs off electric chair for example Injection provided today. Last year only lasted about a month per patient but this is the first he has complained in over a year.   Other options limited (Tylenol- no help, NSaids- don't use with CKD, and heart risk, Tramadol/narcotics- constipation). Discussed could use injection every 3- 4 months if needed once again  His mobility has also decreased due to worsening knee pain- will get plugged back in with [facility] PT/OT/aide. It has gotten so bad that he has to get wife to help dress him and he needs retraining on how to clothe himself while working through barriers of knee pain and deconditioning.

## 2016-05-15 NOTE — Progress Notes (Signed)
Subjective:  Roger [name] is a 82 y.o. year old very pleasant male patient who presents for/with See problem oriented charting ROS- secretions in mouth better, swelling stable, no shortness of breath, trouble with mobility due to pain in right knee.see any ROS included in HPI as well.   Past Medical History-  Patient Active Problem List   Diagnosis Date Noted  . Diastolic CHF ([place]) Q000111Q    Priority: High  . Acute respiratory failure with hypoxia ([place]) 03/10/2015    Priority: High  . Depression 07/05/2015    Priority: Medium  . Chronic throat clearing 10/11/2014    Priority: Medium  . CKD (chronic kidney disease), stage III 07/13/2014    Priority: Medium  . Overactive bladder 07/13/2014    Priority: Medium  . Syncope 11/17/2013    Priority: Medium  . Hypertension 06/17/2007    Priority: Medium  . Hyperlipidemia 02/22/2007    Priority: Medium  . Venous (peripheral) insufficiency 01/26/2015    Priority: Low  . Macular degeneration 06/19/2011    Priority: Low  . Osteoarthritis 06/05/2009    Priority: Low  . CARCINOMA, SKIN, SQUAMOUS CELL 05/26/2008    Priority: Low  . GERD (gastroesophageal reflux disease) 02/22/2007    Priority: Low    Medications- reviewed and updated Current Outpatient Prescriptions  Medication Sig Dispense Refill  . aspirin (ECOTRIN) 325 MG EC tablet Take 325 mg by mouth daily.    . fluticasone (FLONASE) 50 MCG/ACT nasal spray Place 2 sprays into both nostrils daily. 16 g 6  . furosemide (LASIX) 20 MG tablet Take 1 tablet (20 mg total) by mouth daily. 90 tablet 1  . guaiFENesin (MUCINEX) 600 MG 12 hr tablet Take 2 tablets (1,200 mg total) by mouth 2 (two) times daily. 60 tablet 0  . losartan (COZAAR) 100 MG tablet TAKE 1 TABLET BY MOUTH EVERY DAY 90 tablet 3  . lovastatin (MEVACOR) 40 MG tablet TAKE 1 TABLET BY MOUTH EVERY DAY 90 tablet 2  . Multiple Vitamins-Minerals (MULTIVITAMIN WITH MINERALS) tablet Take 1 tablet by mouth daily.    [name]  omeprazole (PRILOSEC) 20 MG capsule TAKE ONE CAPSULE BY MOUTH TWICE A DAY BEFORE A MEAL 180 capsule 2  . oxybutynin (DITROPAN) 5 MG tablet Take 0.5 tablets (2.5 mg total) by mouth daily. 45 tablet 3  . sertraline (ZOLOFT) 25 MG tablet TAKE 1 TABLET BY MOUTH EVERY DAY 30 tablet 5  . cetirizine (ZYRTEC) 10 MG tablet TAKE 1 TABLET BY MOUTH EVERY DAY (Patient not taking: Reported on 05/15/2016) 30 tablet 8  . UNABLE TO FIND Med Name: Med pass 120 mL by mouth twice daily for supplement     No current facility-administered medications for this visit.     Objective: BP 128/78 (BP Location: Left Arm, Patient Position: Sitting, Cuff Size: Normal)   Pulse 85   Temp 97.6 F (36.4 C) (Oral)   Ht 5' 8.5" (1.74 m)   Wt 171 lb 8 oz (77.8 kg)   SpO2 93%   BMI 25.70 kg/m  Gen: NAD, resting comfortably CV: RRR no murmurs rubs or gallops Lungs: CTAB no crackles, wheeze, rhonchi Abdomen: soft/nontender/nondistended/normal bowel sounds. .  Ext: 2+ chronic edema Skin: warm, dry Neuro: grossly normal, moves all extremities, needs large % of assist to stand  Right knee injection Consent obtained and verified verbally Sterile betadine prep. Furthur cleansed with alcohol. Topical analgesic spray: Ethyl chloride. Joint: right knee Approached in typical fashion with: medial approach Completed without difficulty Meds: 4 cc  lidocaine no epi, 1 cc depo medrol 80mg /ml Needle:1 1/2 inch Aftercare instructions and Red flags advised.   Assessment/Plan:  Diastolic CHF ([place]) S: largely stable on lasix 20mg  though weight slightly up. No SOB A/P: continue lasix- likely some venous insufficiency as well- advise elevate legs   Hypertension S: controlled on losartan 100mg , lasix 20mg  BP Readings from Last 3 Encounters:  05/15/16 128/78  02/05/16 140/88  01/15/16 118/72  A/P:Continue current medications  Hyperlipidemia S: reasonably controlled on lovastatin 40mg . No myalgias.  Lab Results  Component  Value Date   CHOL 139 05/19/2012   HDL 44.50 05/19/2012   LDLCALC 73 05/19/2012   TRIG 109.0 05/19/2012   CHOLHDL 3 05/19/2012   A/P: continue current meds  Osteoarthritis R knee osteoarthritis- poor control. Spending more time in chair due to knee and getting harder to be mobile trouble getting up stairs off electric chair for example Injection provided today. Last year only lasted about a month per patient but this is the first he has complained in over a year.   Other options limited (Tylenol- no help, NSaids- don't use with CKD, and heart risk, Tramadol/narcotics- constipation). Discussed could use injection every 3- 4 months if needed once again  His mobility has also decreased due to worsening knee pain- will get plugged back in with [facility] PT/OT/aide. It has gotten so bad that he has to get wife to help dress him and he needs retraining on how to clothe himself while working through barriers of knee pain and deconditioning.     Orders Placed This Encounter  Procedures  . Ambulatory referral to Home Health    Referral Priority:   Routine    Referral Type:   Home Health Care    Referral Reason:   Specialty Services Required    Requested Specialty:   [place]    Number of Visits Requested:   1    Meds ordered this encounter  Medications  . methylPREDNISolone acetate (DEPO-MEDROL) injection 80 mg    Return precautions advised.  Garret Reddish, MD

## 2016-05-15 NOTE — Patient Instructions (Addendum)
R knee arthritis  Hopeful injection helps longer than last one  Should kick in within 48 hours  May have some increased pain tonight, can ice knee if needed  Follow up if worsening pain, swelling, redness around knee  Hopeful this helps you for at least 3 months  No changes in medication  I will get you set up for referral home health PT/OT/home aids

## 2016-05-15 NOTE — Assessment & Plan Note (Signed)
S: reasonably controlled on lovastatin 40mg . No myalgias.  Lab Results  Component Value Date   CHOL 139 05/19/2012   HDL 44.50 05/19/2012   LDLCALC 73 05/19/2012   TRIG 109.0 05/19/2012   CHOLHDL 3 05/19/2012   A/P: continue current meds

## 2016-05-19 DIAGNOSIS — Z85828 Personal history of other malignant neoplasm of skin: Secondary | ICD-10-CM | POA: Diagnosis not present

## 2016-05-19 DIAGNOSIS — I872 Venous insufficiency (chronic) (peripheral): Secondary | ICD-10-CM | POA: Diagnosis not present

## 2016-05-19 DIAGNOSIS — H3530 Unspecified macular degeneration: Secondary | ICD-10-CM | POA: Diagnosis not present

## 2016-05-19 DIAGNOSIS — I5032 Chronic diastolic (congestive) heart failure: Secondary | ICD-10-CM | POA: Diagnosis not present

## 2016-05-19 DIAGNOSIS — E785 Hyperlipidemia, unspecified: Secondary | ICD-10-CM | POA: Diagnosis not present

## 2016-05-19 DIAGNOSIS — Z7982 Long term (current) use of aspirin: Secondary | ICD-10-CM | POA: Diagnosis not present

## 2016-05-19 DIAGNOSIS — I130 Hypertensive heart and chronic kidney disease with heart failure and stage 1 through stage 4 chronic kidney disease, or unspecified chronic kidney disease: Secondary | ICD-10-CM | POA: Diagnosis not present

## 2016-05-19 DIAGNOSIS — M6281 Muscle weakness (generalized): Secondary | ICD-10-CM | POA: Diagnosis not present

## 2016-05-19 DIAGNOSIS — N183 Chronic kidney disease, stage 3 (moderate): Secondary | ICD-10-CM | POA: Diagnosis not present

## 2016-05-19 DIAGNOSIS — K219 Gastro-esophageal reflux disease without esophagitis: Secondary | ICD-10-CM | POA: Diagnosis not present

## 2016-05-19 DIAGNOSIS — M1711 Unilateral primary osteoarthritis, right knee: Secondary | ICD-10-CM | POA: Diagnosis not present

## 2016-05-21 DIAGNOSIS — Z85828 Personal history of other malignant neoplasm of skin: Secondary | ICD-10-CM | POA: Diagnosis not present

## 2016-05-21 DIAGNOSIS — I872 Venous insufficiency (chronic) (peripheral): Secondary | ICD-10-CM | POA: Diagnosis not present

## 2016-05-21 DIAGNOSIS — I130 Hypertensive heart and chronic kidney disease with heart failure and stage 1 through stage 4 chronic kidney disease, or unspecified chronic kidney disease: Secondary | ICD-10-CM | POA: Diagnosis not present

## 2016-05-21 DIAGNOSIS — Z7982 Long term (current) use of aspirin: Secondary | ICD-10-CM | POA: Diagnosis not present

## 2016-05-21 DIAGNOSIS — K219 Gastro-esophageal reflux disease without esophagitis: Secondary | ICD-10-CM | POA: Diagnosis not present

## 2016-05-21 DIAGNOSIS — E785 Hyperlipidemia, unspecified: Secondary | ICD-10-CM | POA: Diagnosis not present

## 2016-05-21 DIAGNOSIS — I5032 Chronic diastolic (congestive) heart failure: Secondary | ICD-10-CM | POA: Diagnosis not present

## 2016-05-21 DIAGNOSIS — N183 Chronic kidney disease, stage 3 (moderate): Secondary | ICD-10-CM | POA: Diagnosis not present

## 2016-05-21 DIAGNOSIS — M1711 Unilateral primary osteoarthritis, right knee: Secondary | ICD-10-CM | POA: Diagnosis not present

## 2016-05-21 DIAGNOSIS — H3530 Unspecified macular degeneration: Secondary | ICD-10-CM | POA: Diagnosis not present

## 2016-05-21 DIAGNOSIS — M6281 Muscle weakness (generalized): Secondary | ICD-10-CM | POA: Diagnosis not present

## 2016-05-21 NOTE — Progress Notes (Signed)
Spoke with wife Raquel Sarna and they will come on Friday when the care giver arrives. It should be around 1:00pm.

## 2016-05-22 ENCOUNTER — Telehealth: Payer: Self-pay | Admitting: Family Medicine

## 2016-05-22 NOTE — Telephone Encounter (Signed)
° °  Liji from [place] call to ask for verbal orders for home home PT 2 times for 2 weeks,1 for 1 week   [id number]

## 2016-05-22 NOTE — Telephone Encounter (Signed)
Sharyn Lull with [facility] home health OT  states she did pt's evaluation today. It will be an eval only, because pt has refused the servides. Sharyn Lull does feel like pt could benefit from the services, but of course declined

## 2016-05-23 ENCOUNTER — Ambulatory Visit (INDEPENDENT_AMBULATORY_CARE_PROVIDER_SITE_OTHER): Payer: Medicare Other

## 2016-05-23 DIAGNOSIS — Z23 Encounter for immunization: Secondary | ICD-10-CM

## 2016-05-23 NOTE — Telephone Encounter (Signed)
Verbal Order provided as requested

## 2016-05-28 DIAGNOSIS — E785 Hyperlipidemia, unspecified: Secondary | ICD-10-CM | POA: Diagnosis not present

## 2016-05-28 DIAGNOSIS — M6281 Muscle weakness (generalized): Secondary | ICD-10-CM | POA: Diagnosis not present

## 2016-05-28 DIAGNOSIS — H3530 Unspecified macular degeneration: Secondary | ICD-10-CM | POA: Diagnosis not present

## 2016-05-28 DIAGNOSIS — I130 Hypertensive heart and chronic kidney disease with heart failure and stage 1 through stage 4 chronic kidney disease, or unspecified chronic kidney disease: Secondary | ICD-10-CM | POA: Diagnosis not present

## 2016-05-28 DIAGNOSIS — N183 Chronic kidney disease, stage 3 (moderate): Secondary | ICD-10-CM | POA: Diagnosis not present

## 2016-05-28 DIAGNOSIS — Z85828 Personal history of other malignant neoplasm of skin: Secondary | ICD-10-CM | POA: Diagnosis not present

## 2016-05-28 DIAGNOSIS — Z7982 Long term (current) use of aspirin: Secondary | ICD-10-CM | POA: Diagnosis not present

## 2016-05-28 DIAGNOSIS — M1711 Unilateral primary osteoarthritis, right knee: Secondary | ICD-10-CM | POA: Diagnosis not present

## 2016-05-28 DIAGNOSIS — K219 Gastro-esophageal reflux disease without esophagitis: Secondary | ICD-10-CM | POA: Diagnosis not present

## 2016-05-28 DIAGNOSIS — I5032 Chronic diastolic (congestive) heart failure: Secondary | ICD-10-CM | POA: Diagnosis not present

## 2016-05-28 DIAGNOSIS — I872 Venous insufficiency (chronic) (peripheral): Secondary | ICD-10-CM | POA: Diagnosis not present

## 2016-05-30 DIAGNOSIS — M1711 Unilateral primary osteoarthritis, right knee: Secondary | ICD-10-CM | POA: Diagnosis not present

## 2016-05-30 DIAGNOSIS — E785 Hyperlipidemia, unspecified: Secondary | ICD-10-CM | POA: Diagnosis not present

## 2016-05-30 DIAGNOSIS — I5032 Chronic diastolic (congestive) heart failure: Secondary | ICD-10-CM | POA: Diagnosis not present

## 2016-05-30 DIAGNOSIS — M6281 Muscle weakness (generalized): Secondary | ICD-10-CM | POA: Diagnosis not present

## 2016-05-30 DIAGNOSIS — Z85828 Personal history of other malignant neoplasm of skin: Secondary | ICD-10-CM | POA: Diagnosis not present

## 2016-05-30 DIAGNOSIS — I130 Hypertensive heart and chronic kidney disease with heart failure and stage 1 through stage 4 chronic kidney disease, or unspecified chronic kidney disease: Secondary | ICD-10-CM | POA: Diagnosis not present

## 2016-05-30 DIAGNOSIS — Z7982 Long term (current) use of aspirin: Secondary | ICD-10-CM | POA: Diagnosis not present

## 2016-05-30 DIAGNOSIS — I872 Venous insufficiency (chronic) (peripheral): Secondary | ICD-10-CM | POA: Diagnosis not present

## 2016-05-30 DIAGNOSIS — H3530 Unspecified macular degeneration: Secondary | ICD-10-CM | POA: Diagnosis not present

## 2016-05-30 DIAGNOSIS — N183 Chronic kidney disease, stage 3 (moderate): Secondary | ICD-10-CM | POA: Diagnosis not present

## 2016-05-30 DIAGNOSIS — K219 Gastro-esophageal reflux disease without esophagitis: Secondary | ICD-10-CM | POA: Diagnosis not present

## 2016-06-04 DIAGNOSIS — K219 Gastro-esophageal reflux disease without esophagitis: Secondary | ICD-10-CM | POA: Diagnosis not present

## 2016-06-04 DIAGNOSIS — I5032 Chronic diastolic (congestive) heart failure: Secondary | ICD-10-CM | POA: Diagnosis not present

## 2016-06-04 DIAGNOSIS — Z7982 Long term (current) use of aspirin: Secondary | ICD-10-CM | POA: Diagnosis not present

## 2016-06-04 DIAGNOSIS — N183 Chronic kidney disease, stage 3 (moderate): Secondary | ICD-10-CM | POA: Diagnosis not present

## 2016-06-04 DIAGNOSIS — H3530 Unspecified macular degeneration: Secondary | ICD-10-CM | POA: Diagnosis not present

## 2016-06-04 DIAGNOSIS — E785 Hyperlipidemia, unspecified: Secondary | ICD-10-CM | POA: Diagnosis not present

## 2016-06-04 DIAGNOSIS — I872 Venous insufficiency (chronic) (peripheral): Secondary | ICD-10-CM | POA: Diagnosis not present

## 2016-06-04 DIAGNOSIS — M1711 Unilateral primary osteoarthritis, right knee: Secondary | ICD-10-CM | POA: Diagnosis not present

## 2016-06-04 DIAGNOSIS — Z85828 Personal history of other malignant neoplasm of skin: Secondary | ICD-10-CM | POA: Diagnosis not present

## 2016-06-04 DIAGNOSIS — I130 Hypertensive heart and chronic kidney disease with heart failure and stage 1 through stage 4 chronic kidney disease, or unspecified chronic kidney disease: Secondary | ICD-10-CM | POA: Diagnosis not present

## 2016-06-04 DIAGNOSIS — M6281 Muscle weakness (generalized): Secondary | ICD-10-CM | POA: Diagnosis not present

## 2016-06-06 DIAGNOSIS — I872 Venous insufficiency (chronic) (peripheral): Secondary | ICD-10-CM | POA: Diagnosis not present

## 2016-06-06 DIAGNOSIS — I130 Hypertensive heart and chronic kidney disease with heart failure and stage 1 through stage 4 chronic kidney disease, or unspecified chronic kidney disease: Secondary | ICD-10-CM | POA: Diagnosis not present

## 2016-06-06 DIAGNOSIS — M6281 Muscle weakness (generalized): Secondary | ICD-10-CM | POA: Diagnosis not present

## 2016-06-06 DIAGNOSIS — Z85828 Personal history of other malignant neoplasm of skin: Secondary | ICD-10-CM | POA: Diagnosis not present

## 2016-06-06 DIAGNOSIS — K219 Gastro-esophageal reflux disease without esophagitis: Secondary | ICD-10-CM | POA: Diagnosis not present

## 2016-06-06 DIAGNOSIS — H3530 Unspecified macular degeneration: Secondary | ICD-10-CM | POA: Diagnosis not present

## 2016-06-06 DIAGNOSIS — Z7982 Long term (current) use of aspirin: Secondary | ICD-10-CM | POA: Diagnosis not present

## 2016-06-06 DIAGNOSIS — I5032 Chronic diastolic (congestive) heart failure: Secondary | ICD-10-CM | POA: Diagnosis not present

## 2016-06-06 DIAGNOSIS — E785 Hyperlipidemia, unspecified: Secondary | ICD-10-CM | POA: Diagnosis not present

## 2016-06-06 DIAGNOSIS — N183 Chronic kidney disease, stage 3 (moderate): Secondary | ICD-10-CM | POA: Diagnosis not present

## 2016-06-06 DIAGNOSIS — M1711 Unilateral primary osteoarthritis, right knee: Secondary | ICD-10-CM | POA: Diagnosis not present

## 2016-06-09 ENCOUNTER — Other Ambulatory Visit: Payer: Self-pay | Admitting: Family Medicine

## 2016-06-10 DIAGNOSIS — M1711 Unilateral primary osteoarthritis, right knee: Secondary | ICD-10-CM | POA: Diagnosis not present

## 2016-06-10 DIAGNOSIS — E785 Hyperlipidemia, unspecified: Secondary | ICD-10-CM | POA: Diagnosis not present

## 2016-06-10 DIAGNOSIS — N183 Chronic kidney disease, stage 3 (moderate): Secondary | ICD-10-CM | POA: Diagnosis not present

## 2016-06-10 DIAGNOSIS — K219 Gastro-esophageal reflux disease without esophagitis: Secondary | ICD-10-CM | POA: Diagnosis not present

## 2016-06-10 DIAGNOSIS — Z7982 Long term (current) use of aspirin: Secondary | ICD-10-CM | POA: Diagnosis not present

## 2016-06-10 DIAGNOSIS — I5032 Chronic diastolic (congestive) heart failure: Secondary | ICD-10-CM | POA: Diagnosis not present

## 2016-06-10 DIAGNOSIS — H3530 Unspecified macular degeneration: Secondary | ICD-10-CM | POA: Diagnosis not present

## 2016-06-10 DIAGNOSIS — I872 Venous insufficiency (chronic) (peripheral): Secondary | ICD-10-CM | POA: Diagnosis not present

## 2016-06-10 DIAGNOSIS — I130 Hypertensive heart and chronic kidney disease with heart failure and stage 1 through stage 4 chronic kidney disease, or unspecified chronic kidney disease: Secondary | ICD-10-CM | POA: Diagnosis not present

## 2016-06-10 DIAGNOSIS — Z85828 Personal history of other malignant neoplasm of skin: Secondary | ICD-10-CM | POA: Diagnosis not present

## 2016-06-10 DIAGNOSIS — M6281 Muscle weakness (generalized): Secondary | ICD-10-CM | POA: Diagnosis not present

## 2016-06-12 ENCOUNTER — Ambulatory Visit: Payer: Medicare Other | Admitting: Family Medicine

## 2016-06-16 ENCOUNTER — Encounter: Payer: Self-pay | Admitting: Family Medicine

## 2016-06-16 ENCOUNTER — Other Ambulatory Visit: Payer: Self-pay

## 2016-06-16 ENCOUNTER — Ambulatory Visit (INDEPENDENT_AMBULATORY_CARE_PROVIDER_SITE_OTHER): Payer: Medicare Other | Admitting: Family Medicine

## 2016-06-16 VITALS — BP 134/80 | HR 78 | Temp 97.6°F | Wt 168.4 lb

## 2016-06-16 DIAGNOSIS — S81802A Unspecified open wound, left lower leg, initial encounter: Secondary | ICD-10-CM | POA: Diagnosis not present

## 2016-06-16 DIAGNOSIS — S61412A Laceration without foreign body of left hand, initial encounter: Secondary | ICD-10-CM

## 2016-06-16 MED ORDER — SERTRALINE HCL 25 MG PO TABS: 25.0000 mg | ORAL_TABLET | Freq: Every day | ORAL | 5 refills | Status: DC

## 2016-06-16 NOTE — Progress Notes (Signed)
Pre visit review using our clinic review tool, if applicable. No additional management support is needed unless otherwise documented below in the visit note. 

## 2016-06-17 NOTE — Patient Instructions (Signed)
Handwritten avs as internet down

## 2016-06-17 NOTE — Progress Notes (Signed)
Subjective:  Roger [name] is a 82 y.o. year old very pleasant male patient who presents for/with See problem oriented charting ROS- no fever, chills, nausea, vomiting, expanding redness on hand or on left lateral ankle.see any ROS included in HPI as well.   Past Medical History-  Patient Active Problem List   Diagnosis Date Noted  . Diastolic CHF ([place]) Q000111Q    Priority: High  . Acute respiratory failure with hypoxia ([place]) 03/10/2015    Priority: High  . Depression 07/05/2015    Priority: Medium  . Chronic throat clearing 10/11/2014    Priority: Medium  . CKD (chronic kidney disease), stage III 07/13/2014    Priority: Medium  . Overactive bladder 07/13/2014    Priority: Medium  . Syncope 11/17/2013    Priority: Medium  . Hypertension 06/17/2007    Priority: Medium  . Hyperlipidemia 02/22/2007    Priority: Medium  . Venous (peripheral) insufficiency 01/26/2015    Priority: Low  . Macular degeneration 06/19/2011    Priority: Low  . Osteoarthritis 06/05/2009    Priority: Low  . CARCINOMA, SKIN, SQUAMOUS CELL 05/26/2008    Priority: Low  . GERD (gastroesophageal reflux disease) 02/22/2007    Priority: Low    Medications- reviewed and updated Current Outpatient Prescriptions  Medication Sig Dispense Refill  . aspirin (ECOTRIN) 325 MG EC tablet Take 325 mg by mouth daily.    . cetirizine (ZYRTEC) 10 MG tablet TAKE 1 TABLET BY MOUTH EVERY DAY (Patient not taking: Reported on 05/15/2016) 30 tablet 8  . fluticasone (FLONASE) 50 MCG/ACT nasal spray Place 2 sprays into both nostrils daily. 16 g 6  . furosemide (LASIX) 20 MG tablet TAKE 1 TABLET (20 MG TOTAL) BY MOUTH DAILY. 90 tablet 1  . guaiFENesin (MUCINEX) 600 MG 12 hr tablet Take 2 tablets (1,200 mg total) by mouth 2 (two) times daily. 60 tablet 0  . losartan (COZAAR) 100 MG tablet TAKE 1 TABLET BY MOUTH EVERY DAY 90 tablet 3  . lovastatin (MEVACOR) 40 MG tablet TAKE 1 TABLET BY MOUTH EVERY DAY 90 tablet 2  . Multiple  Vitamins-Minerals (MULTIVITAMIN WITH MINERALS) tablet Take 1 tablet by mouth daily.    [name] omeprazole (PRILOSEC) 20 MG capsule TAKE ONE CAPSULE BY MOUTH TWICE A DAY BEFORE A MEAL 180 capsule 2  . oxybutynin (DITROPAN) 5 MG tablet Take 0.5 tablets (2.5 mg total) by mouth daily. 45 tablet 3  . sertraline (ZOLOFT) 25 MG tablet Take 1 tablet (25 mg total) by mouth daily. 30 tablet 5  . UNABLE TO FIND Med Name: Med pass 120 mL by mouth twice daily for supplement     No current facility-administered medications for this visit.     Objective: BP 134/80   Pulse 78   Temp 97.6 F (36.4 C) (Oral)   Wt 168 lb 6.4 oz (76.4 kg)   SpO2 96%   BMI 25.23 kg/m  Gen: NAD, resting comfortably CV: RRR no murmurs rubs or gallops Lungs: CTAB no crackles, wheeze, rhonchi Ext: 2+ edema under no compression stockings, pulses noted DP On left lateral ankle there is about a 1 cm wound with depth in middle of 2-3 mm. Nontender, no surrounding erythema or warmth Skin: warm, dry, no significant venous stasis changes Neuro: sits in his rolling 4 wheel walker  Assessment/Plan:  Left ankle wound S: Patient states he hit his left ankle 5-6 months ago when he was in rehab. Had a scab form on it and just fell off about  1-2 weeks ago and noted still has a wound underneath. Has covered with bandaid and neosporin since but has not began to heal well. He was concerned given length it was present. No pain in this area.  A/P: Odd that wound has not healed in this period which concerns me for possible skin cancer or underlying deep infection but wound appears superficial. He keeps socks on all the time- advised him to let this air dry as much as possible and keep socks off and return to see me in 6 weeks if not healing or sooner if worsens. Discussed option of dermatology vs. Wound care center depending on appearance at follow up.   Also had laceration on left hand with skin flap that appears to be healing well- diccussed return  precautions and noted Td in 2010 so up to date   Return precautions advised.  Garret Reddish, MD

## 2016-07-16 ENCOUNTER — Telehealth: Payer: Self-pay | Admitting: Family Medicine

## 2016-07-16 NOTE — Telephone Encounter (Signed)
Pt's left ankle is not looking much better. Pt instructed to follow up with Dr Yong Channel if still the same. Pt would like to see the dr Molli Knock or Wed , these are the days caregiver can bring. Please advise if ok to work in.

## 2016-07-16 NOTE — Telephone Encounter (Signed)
Can we just set patient up with referral for wound care center please under nonhealing wound of leg? If they do not want to do that- could use my 11 30 on wednesday

## 2016-07-16 NOTE — Telephone Encounter (Signed)
Spoke to pt's wife, told her Dr. Yong Channel said we can set up a referral to [place] to look at leg if willing or he can see him on Wednesday at 11:30. Mrs. Sodergren said they would rather see Dr.Hunter. Told her okay checked the schedule and slot was taken. Asked her if he can come in Wed at 10:30 instead that is all that is available. Mrs. Atlee checked with pt and he said that is okay. Told them okay appt scheduled for Wed Nov 29th at 10:30 with Dr. Yong Channel. Mrs. Stelma and pt verbalized understanding.

## 2016-07-17 ENCOUNTER — Other Ambulatory Visit: Payer: Self-pay | Admitting: Family Medicine

## 2016-07-23 ENCOUNTER — Encounter: Payer: Self-pay | Admitting: Family Medicine

## 2016-07-23 ENCOUNTER — Ambulatory Visit (INDEPENDENT_AMBULATORY_CARE_PROVIDER_SITE_OTHER)
Admission: RE | Admit: 2016-07-23 | Discharge: 2016-07-23 | Disposition: A | Discharge status: Home / Self Care | Payer: Medicare Other | Source: Ambulatory Visit | Attending: Family Medicine | Admitting: Family Medicine

## 2016-07-23 ENCOUNTER — Ambulatory Visit (INDEPENDENT_AMBULATORY_CARE_PROVIDER_SITE_OTHER): Payer: Medicare Other | Admitting: Family Medicine

## 2016-07-23 VITALS — BP 134/80 | HR 76 | Temp 98.2°F | Ht 68.5 in | Wt 170.8 lb

## 2016-07-23 DIAGNOSIS — M7989 Other specified soft tissue disorders: Secondary | ICD-10-CM | POA: Diagnosis not present

## 2016-07-23 DIAGNOSIS — M25572 Pain in left ankle and joints of left foot: Secondary | ICD-10-CM

## 2016-07-23 DIAGNOSIS — S81802A Unspecified open wound, left lower leg, initial encounter: Secondary | ICD-10-CM | POA: Diagnosis not present

## 2016-07-23 DIAGNOSIS — G8929 Other chronic pain: Secondary | ICD-10-CM | POA: Diagnosis not present

## 2016-07-23 NOTE — Progress Notes (Signed)
Pre visit review using our clinic review tool, if applicable. No additional management support is needed unless otherwise documented below in the visit note. 

## 2016-07-23 NOTE — Patient Instructions (Signed)
Please go to [facility] for films of left ankle- rule out deeper infection in the bone - located [address] across [address] from [place] - in the basement - Hours: 8:30-5:30 PM M-F. Do not need appointment.   Follow up with me in 6 weeks. It is slowly improving. Would lubricate dry skin around it with vaseline twice a day but leave wound dry and let air dry still. Still considering wound care and dermatology depending on how you are progressing

## 2016-07-23 NOTE — Progress Notes (Signed)
Subjective:  Roger [name] is a 82 y.o. year old very pleasant male patient who presents for/with See problem oriented charting ROS- no fever, chills, nausea, vomiting, expanding redness on  left lateral ankle.  Past Medical History-  Patient Active Problem List   Diagnosis Date Noted  . Diastolic CHF ([place]) Q000111Q    Priority: High  . Acute respiratory failure with hypoxia ([place]) 03/10/2015    Priority: High  . Depression 07/05/2015    Priority: Medium  . Chronic throat clearing 10/11/2014    Priority: Medium  . CKD (chronic kidney disease), stage III 07/13/2014    Priority: Medium  . Overactive bladder 07/13/2014    Priority: Medium  . Syncope 11/17/2013    Priority: Medium  . Hypertension 06/17/2007    Priority: Medium  . Hyperlipidemia 02/22/2007    Priority: Medium  . Venous (peripheral) insufficiency 01/26/2015    Priority: Low  . Macular degeneration 06/19/2011    Priority: Low  . Osteoarthritis 06/05/2009    Priority: Low  . CARCINOMA, SKIN, SQUAMOUS CELL 05/26/2008    Priority: Low  . GERD (gastroesophageal reflux disease) 02/22/2007    Priority: Low    Medications- reviewed and updated Current Outpatient Prescriptions  Medication Sig Dispense Refill  . aspirin (ECOTRIN) 325 MG EC tablet Take 325 mg by mouth daily.    . cetirizine (ZYRTEC) 10 MG tablet TAKE 1 TABLET BY MOUTH EVERY DAY 30 tablet 8  . fluticasone (FLONASE) 50 MCG/ACT nasal spray Place 2 sprays into both nostrils daily. 16 g 6  . furosemide (LASIX) 20 MG tablet TAKE 1 TABLET (20 MG TOTAL) BY MOUTH DAILY. 90 tablet 1  . guaiFENesin (MUCINEX) 600 MG 12 hr tablet Take 2 tablets (1,200 mg total) by mouth 2 (two) times daily. 60 tablet 0  . losartan (COZAAR) 100 MG tablet TAKE 1 TABLET BY MOUTH EVERY DAY 90 tablet 3  . lovastatin (MEVACOR) 40 MG tablet TAKE 1 TABLET BY MOUTH EVERY DAY 90 tablet 3  . Multiple Vitamins-Minerals (MULTIVITAMIN WITH MINERALS) tablet Take 1 tablet by mouth daily.    [name]  omeprazole (PRILOSEC) 20 MG capsule TAKE ONE CAPSULE BY MOUTH TWICE A DAY BEFORE A MEAL 180 capsule 2  . oxybutynin (DITROPAN) 5 MG tablet Take 0.5 tablets (2.5 mg total) by mouth daily. 45 tablet 3  . sertraline (ZOLOFT) 25 MG tablet Take 1 tablet (25 mg total) by mouth daily. 30 tablet 5  . UNABLE TO FIND Med Name: Med pass 120 mL by mouth twice daily for supplement     No current facility-administered medications for this visit.     Objective: BP 134/80 (BP Location: Left Arm, Patient Position: Sitting, Cuff Size: Normal)   Pulse 76   Temp 98.2 F (36.8 C) (Oral)   Ht 5' 8.5" (1.74 m)   Wt 170 lb 12.8 oz (77.5 kg)   SpO2 96%   BMI 25.59 kg/m  Gen: NAD, resting comfortably CV: regular rate Lungs: no labored breathing Ext: 2+ edema under no compression stockings, pulses noted DP On left lateral ankle there is now a 108mm down from 1 cm. Mildly tender to palpation (new). There is scale over wound and when removed still with depth in middle of 2-3 mm. no surrounding erythema or warmth Skin: warm, dry, no surrounding redness Neuro: in wheelchair today  Assessment/Plan:  Left ankle wound S:  Left ankle wound now over 6 months since getting out of rehab. Has scab on it that forms then falls off.  Was doing bandaid and neosporin before. We saw 6 weeks ago and encouraged air drying. Originally hit this in rehab and that started this all.   Started with some new tenderness since saw him last but wound is getting smaller. No surrounding redness. Has scabbed over again.   A/P: 6 month wound and now with pain with palpation but decreasing in size- will get x-ray to rule out osteomyelitis and recheck 6 weeks if no osteomyelitis. Still considering dermatology or wound care (favor the 2nd). Keep with air drying.   Return precautions advised.  Garret Reddish, MD

## 2016-09-15 ENCOUNTER — Other Ambulatory Visit: Payer: Self-pay | Admitting: Family Medicine

## 2016-10-16 ENCOUNTER — Encounter: Payer: Self-pay | Admitting: Family Medicine

## 2016-10-16 ENCOUNTER — Ambulatory Visit (INDEPENDENT_AMBULATORY_CARE_PROVIDER_SITE_OTHER): Payer: Medicare Other | Admitting: Family Medicine

## 2016-10-16 VITALS — BP 118/68 | HR 81 | Temp 97.9°F

## 2016-10-16 DIAGNOSIS — R739 Hyperglycemia, unspecified: Secondary | ICD-10-CM | POA: Diagnosis not present

## 2016-10-16 DIAGNOSIS — M1711 Unilateral primary osteoarthritis, right knee: Secondary | ICD-10-CM | POA: Diagnosis not present

## 2016-10-16 DIAGNOSIS — N183 Chronic kidney disease, stage 3 unspecified: Secondary | ICD-10-CM

## 2016-10-16 DIAGNOSIS — E785 Hyperlipidemia, unspecified: Secondary | ICD-10-CM

## 2016-10-16 DIAGNOSIS — G8929 Other chronic pain: Secondary | ICD-10-CM

## 2016-10-16 DIAGNOSIS — M159 Polyosteoarthritis, unspecified: Secondary | ICD-10-CM

## 2016-10-16 DIAGNOSIS — M150 Primary generalized (osteo)arthritis: Secondary | ICD-10-CM

## 2016-10-16 DIAGNOSIS — L97321 Non-pressure chronic ulcer of left ankle limited to breakdown of skin: Secondary | ICD-10-CM

## 2016-10-16 DIAGNOSIS — M25561 Pain in right knee: Secondary | ICD-10-CM

## 2016-10-16 LAB — CBC
HCT: 33.1 % — ABNORMAL LOW (ref 39.0–52.0)
HEMOGLOBIN: 11.1 g/dL — AB (ref 13.0–17.0)
MCHC: 33.7 g/dL (ref 30.0–36.0)
MCV: 95.8 fl (ref 78.0–100.0)
Platelets: 211 10*3/uL (ref 150.0–400.0)
RBC: 3.45 Mil/uL — ABNORMAL LOW (ref 4.22–5.81)
RDW: 13.5 % (ref 11.5–15.5)
WBC: 7.6 10*3/uL (ref 4.0–10.5)

## 2016-10-16 LAB — COMPREHENSIVE METABOLIC PANEL
ALT: 9 U/L (ref 0–53)
AST: 16 U/L (ref 0–37)
Albumin: 4 g/dL (ref 3.5–5.2)
Alkaline Phosphatase: 66 U/L (ref 39–117)
BUN: 30 mg/dL — ABNORMAL HIGH (ref 6–23)
CALCIUM: 9 mg/dL (ref 8.4–10.5)
CHLORIDE: 108 meq/L (ref 96–112)
CO2: 27 meq/L (ref 19–32)
Creatinine, Ser: 1.5 mg/dL (ref 0.40–1.50)
GFR: 46.21 mL/min — AB (ref >60.00)
Glucose, Bld: 79 mg/dL (ref 70–99)
Potassium: 4.3 mEq/L (ref 3.5–5.1)
Sodium: 142 mEq/L (ref 135–145)
Total Bilirubin: 0.4 mg/dL (ref 0.2–1.2)
Total Protein: 6.5 g/dL (ref 6.0–8.3)

## 2016-10-16 LAB — HEMOGLOBIN A1C: HEMOGLOBIN A1C: 5.6 % (ref 4.6–6.5)

## 2016-10-16 LAB — LDL CHOLESTEROL, DIRECT: Direct LDL: 77 mg/dL

## 2016-10-16 MED ORDER — METHYLPREDNISOLONE ACETATE 80 MG/ML IJ SUSP
80.0000 mg | Freq: Once | INTRAMUSCULAR | Status: AC
  Administered 2016-10-16: 80 mg via INTRA_ARTICULAR

## 2016-10-16 NOTE — Progress Notes (Signed)
Subjective:  Roger [name] is a 83 y.o. year old very pleasant male patient who presents for/with See problem oriented charting ROS- no fever, chills. No redness at right knee, no redness at left ankle other than some very mild erythema and scaling surrounding ulcer.    Past Medical History-  Patient Active Problem List   Diagnosis Date Noted  . Skin ulcer of left ankle, limited to breakdown of skin ([place]) 10/16/2016    Priority: High  . Diastolic CHF ([place]) Q000111Q    Priority: High  . Acute respiratory failure with hypoxia ([place]) 03/10/2015    Priority: High  . Depression 07/05/2015    Priority: Medium  . Chronic throat clearing 10/11/2014    Priority: Medium  . CKD (chronic kidney disease), stage III 07/13/2014    Priority: Medium  . Overactive bladder 07/13/2014    Priority: Medium  . Syncope 11/17/2013    Priority: Medium  . Hypertension 06/17/2007    Priority: Medium  . Hyperlipidemia 02/22/2007    Priority: Medium  . Venous (peripheral) insufficiency 01/26/2015    Priority: Low  . Macular degeneration 06/19/2011    Priority: Low  . Osteoarthritis 06/05/2009    Priority: Low  . CARCINOMA, SKIN, SQUAMOUS CELL 05/26/2008    Priority: Low  . GERD (gastroesophageal reflux disease) 02/22/2007    Priority: Low    Medications- reviewed and updated Current Outpatient Prescriptions  Medication Sig Dispense Refill  . aspirin (ECOTRIN) 325 MG EC tablet Take 325 mg by mouth daily.    . cetirizine (ZYRTEC) 10 MG tablet TAKE 1 TABLET BY MOUTH EVERY DAY 30 tablet 8  . fluticasone (FLONASE) 50 MCG/ACT nasal spray Place 2 sprays into both nostrils daily. 16 g 6  . furosemide (LASIX) 20 MG tablet TAKE 1 TABLET (20 MG TOTAL) BY MOUTH DAILY. 90 tablet 1  . guaiFENesin (MUCINEX) 600 MG 12 hr tablet Take 2 tablets (1,200 mg total) by mouth 2 (two) times daily. 60 tablet 0  . losartan (COZAAR) 100 MG tablet TAKE 1 TABLET BY MOUTH EVERY DAY 90 tablet 3  . lovastatin (MEVACOR) 40 MG  tablet TAKE 1 TABLET BY MOUTH EVERY DAY 90 tablet 3  . Multiple Vitamins-Minerals (MULTIVITAMIN WITH MINERALS) tablet Take 1 tablet by mouth daily.    [name] omeprazole (PRILOSEC) 20 MG capsule TAKE ONE CAPSULE BY MOUTH TWICE A DAY BEFORE A MEAL 180 capsule 2  . oxybutynin (DITROPAN) 5 MG tablet Take 0.5 tablets (2.5 mg total) by mouth daily. 45 tablet 3  . oxybutynin (DITROPAN) 5 MG tablet TAKE 1 TABLET BY MOUTH EVERY DAY 90 tablet 1  . sertraline (ZOLOFT) 25 MG tablet Take 1 tablet (25 mg total) by mouth daily. 30 tablet 5  . UNABLE TO FIND Med Name: Med pass 120 mL by mouth twice daily for supplement     No current facility-administered medications for this visit.     Objective: BP 118/68 (BP Location: Left Arm, Patient Position: Sitting, Cuff Size: Large)   Pulse 81   Temp 97.9 F (36.6 C) (Oral)   SpO2 96%  Gen: NAD, resting comfortably TM normal bilaterally after curretting small amount of wax CV: RRR no murmurs rubs or gallops Lungs: CTAB no crackles, wheeze, rhonchi Abdomen: soft/nontender/nondistended/normal bowel sounds. No rebound or guarding.  Ext: no edema Skin: warm, dry  Assessment/Plan:  Hyperglycemia noted on prior years labs - Plan: Hemoglobin A1c  Chronic pain of right knee from OA - Plan: methylPREDNISolone acetate (DEPO-MEDROL) injection 80 mg  Also he was worried about ear wax- but there was minimal present today and curetted without difficulty  Skin ulcer of left ankle, limited to breakdown of skin ([place]) S: Patient had hit his ankle 5-6 months before I saw him 06/16/16 and he had a scab form in the area and would fall off and sometimes ooze serosanguinous material. He tried neosporin and band aid but never seemed to heal.   After visit- We tried to leave shoes off for most periods and allow to air dry.  We saw him again 07/23/16 and had gone down from 1 x 1 cm area to 16mm x 7 mm. Scab over wound when removed depth remained 2-58mm without surrounding warmth or  erythema. We did an x-ray to evaluate for osteomyelitis of ankle but none seen (thought low likelihood but wanted to rule out). Plan was 6 week follow up which patient did not keep as had wanted to avoid the flu but now that it has been 12 weeks decided to come in. Difficult to heal in setting of known venous insufficiency O: area back to 1 x 1 cm. Serosanguinous drainage with pressure. 2-17mm wound depth.  A/P: we had considered dermatology  (? Skin cancer with nonhealing wound) or wound care consult. I have seen Dr. Sharol Given of [facility] ortho help some of my patients tremendously with nonhealing wounds though and I discussed this with patient- he would prefer to visit Dr. Sharol Given.   We updated labs today to make sure no diabetes and none noted.   Osteoarthritis S: Right knee pain due to OA. Knee injection helped a lot for about pain down to 3/10 but now back up to 5/10.This was done 5 months ago in September. Started getting worse over last month. Has become less mobile again- spending more time in his chair. Prior to last visit injection, prior to that had helped for about a year. Once again-  From prior note "Other options limited (Tylenol- no help, NSaids- don't use with CKD, and heart risk, Tramadol/narcotics- constipation)." O: mild effusion. No joint line pain on right knee.   Consent obtained and verified. Sterile betadine prep. Furthur cleansed with alcohol. Topical analgesic spray: Ethyl chloride. Joint: Right knee Approached in typical fashion with: medio lateral approach Completed without difficulty Meds: 3 cc lidocaine no epinephrine. 1 cc depo medrol 80mg /mL Needle:25 gauge 1 1/2 inch Aftercare instructions and Red flags advised.  A/P: For right knee OA=injection provided today=as per above other options limited and he has had good relief with this in past. Repeat at maximum every 3-4 months. Also reassured no a1c elevation.   Orders Placed This Encounter  Procedures  . CBC    [place]   . Comprehensive metabolic panel    [place]  . LDL cholesterol, direct    [place]  . Hemoglobin A1c    [place]  . Ambulatory referral to Orthopedics    Referral Priority:   Routine    Referral Type:   Consultation    Referred to Provider:   Newt Minion, MD    Meds ordered this encounter  Medications  . methylPREDNISolone acetate (DEPO-MEDROL) injection 80 mg    Return precautions advised.  Garret Reddish, MD

## 2016-10-16 NOTE — Assessment & Plan Note (Signed)
S: Right knee pain due to OA. Knee injection helped a lot for about pain down to 3/10 but now back up to 5/10.This was done 5 months ago in September. Started getting worse over last month. Has become less mobile again- spending more time in his chair. Prior to last visit injection, prior to that had helped for about a year. Once again-  From prior note "Other options limited (Tylenol- no help, NSaids- don't use with CKD, and heart risk, Tramadol/narcotics- constipation)." O: mild effusion. No joint line pain on right knee.   Consent obtained and verified. Sterile betadine prep. Furthur cleansed with alcohol. Topical analgesic spray: Ethyl chloride. Joint: Right knee Approached in typical fashion with: medio lateral approach Completed without difficulty Meds: 3 cc lidocaine no epinephrine. 1 cc depo medrol 80mg /mL Needle:25 gauge 1 1/2 inch Aftercare instructions and Red flags advised.  A/P: For right knee OA=injection provided today=as per above other options limited and he has had good relief with this in past. Repeat at maximum every 3-4 months. Also reassured no a1c elevation.

## 2016-10-16 NOTE — Patient Instructions (Addendum)
We will call you within a week or two about your referral to Dr. Sharol Given of orthopedics. If you do not hear within 3 weeks, give [place] a call.   Ears look fine  R knee arthritis  Hopeful injection helps longer than last one  Should kick in within 48 hours  May have some increased pain tonight, can ice knee if needed  Follow up if worsening pain, swelling, redness around knee  Hopeful this helps you for at least 3 months  Please stop by lab before you go

## 2016-10-16 NOTE — Assessment & Plan Note (Signed)
S: CKD III largely stable on labs A/P: bp controllled as usual. Avoid nsaids.

## 2016-10-16 NOTE — Assessment & Plan Note (Addendum)
S: Patient had hit his ankle 5-6 months before I saw him 06/16/16 and he had a scab form in the area and would fall off and sometimes ooze serosanguinous material. He tried neosporin and band aid but never seemed to heal.   After visit- We tried to leave shoes off for most periods and allow to air dry.  We saw him again 07/23/16 and had gone down from 1 x 1 cm area to 28mm x 7 mm. Scab over wound when removed depth remained 2-22mm without surrounding warmth or erythema. We did an x-ray to evaluate for osteomyelitis of ankle but none seen (thought low likelihood but wanted to rule out). Plan was 6 week follow up which patient did not keep as had wanted to avoid the flu but now that it has been 12 weeks decided to come in. Difficult to heal in setting of known venous insufficiency O: area back to 1 x 1 cm. Serosanguinous drainage with pressure. 2-11mm wound depth.  A/P: we had considered dermatology  (? Skin cancer with nonhealing wound) or wound care consult. I have seen Dr. Sharol Given of [facility] ortho help some of my patients tremendously with nonhealing wounds though and I discussed this with patient- he would prefer to visit Dr. Sharol Given.   We updated labs today to make sure no diabetes and none noted.

## 2016-10-16 NOTE — Progress Notes (Signed)
Pre visit review using our clinic review tool, if applicable. No additional management support is needed unless otherwise documented below in the visit note. 

## 2016-10-16 NOTE — Assessment & Plan Note (Signed)
S: compliant with lovastatin. LDL reasonable goal of 77 today A/P: no clear primary prevention benefit. Also updated cbc and cmp

## 2016-10-22 ENCOUNTER — Ambulatory Visit (INDEPENDENT_AMBULATORY_CARE_PROVIDER_SITE_OTHER): Payer: Medicare Other | Admitting: Orthopedic Surgery

## 2016-10-22 ENCOUNTER — Encounter (INDEPENDENT_AMBULATORY_CARE_PROVIDER_SITE_OTHER): Payer: Self-pay | Admitting: Orthopedic Surgery

## 2016-10-22 VITALS — Ht 68.0 in | Wt 170.0 lb

## 2016-10-22 DIAGNOSIS — L97929 Non-pressure chronic ulcer of unspecified part of left lower leg with unspecified severity: Secondary | ICD-10-CM

## 2016-10-22 DIAGNOSIS — B351 Tinea unguium: Secondary | ICD-10-CM

## 2016-10-22 DIAGNOSIS — I87332 Chronic venous hypertension (idiopathic) with ulcer and inflammation of left lower extremity: Secondary | ICD-10-CM

## 2016-10-22 MED ORDER — DOXYCYCLINE HYCLATE 100 MG PO TABS: 100.0000 mg | ORAL_TABLET | Freq: Two times a day (BID) | ORAL | 0 refills | Status: DC

## 2016-10-22 NOTE — Progress Notes (Signed)
Office Visit Note   Patient: Roger [name]           Date of Birth: 05/31/1920           MRN: [id number] Visit Date: 10/22/2016              Requested by: [name], MD [address] [place], [place] [address] PCP: Garret Reddish, MD  Chief Complaint  Patient presents with  . Left Ankle - Pain    HPI: Pt is here fore eval of a lateral left ankle ulcer. The pt states that it began 2 years ago after he hit his ankle on his rolling walker. The ankle is swollen and appears a little pink. There is a very small area that is open with spot amount of pink drain. The limb is cool to touch. Patient does report pain when the area is touched. He has tried to let it air dry but not resolving and seeking other treatment. Pamella Pert, RMA    Assessment & Plan: Visit Diagnoses:  1. Onychomycosis   2. Idiopathic chronic venous hypertension of left lower extremity with ulcer and inflammation (HCC)     Plan: We'll start him on medical compression stockings and oral antibiotics. Prescription for doxycycline 1 by mouth twice a day for 10 days. He will go to [facility] [facility] to obtain a pair of medical compression stockings. He will wear these around-the-clock change for bathing or showering. Discussed the importance of not allowing a wrinkle in the socket to increased pressure.  Follow-Up Instructions: Return in about 2 weeks (around 11/05/2016).   Ortho Exam Examination patient is alert oriented no adenopathy well-dressed normal affect normal respiratory effort he does have antalgic gait. Examination patient has significant venous insufficiency pitting edema in the left lower extremity. Patient does not have a palpable pulse due to the swelling. A Doppler was used and he has a strong biphasic posterior tibial pulse and a biphasic dorsalis pedis pulse. No signs of arterial insufficiency. There is pitting edema up to the tibial tubercle. The ulcer has some mild redness but no  cellulitis no purulence. This was probed with a silver nitrate stay and this did not probe down to bone. Patient states that he has been using Vaseline on the wound and the drainage from the ulcer may have been from the Vaseline. The ulcer is 5 mm in diameter and 1 mm deep. Patient has thick and discolored onychomycotic nails 5. His unable safely trim the nails on his own and the nails are trimmed 5 without complications.  Imaging: No results found.  Orders:  No orders of the defined types were placed in this encounter.  Meds ordered this encounter  Medications  . DISCONTD: doxycycline (VIBRA-TABS) 100 MG tablet    Sig: Take 1 tablet (100 mg total) by mouth 2 (two) times daily.    Dispense:  20 tablet    Refill:  0  . doxycycline (VIBRA-TABS) 100 MG tablet    Sig: Take 1 tablet (100 mg total) by mouth 2 (two) times daily.    Dispense:  20 tablet    Refill:  0     Procedures: No procedures performed  Clinical Data: No additional findings.  Subjective: Review of Systems  Objective: Vital Signs: Ht 5\' 8"  (1.727 m)   Wt 170 lb (77.1 kg)   BMI 25.85 kg/m   Specialty Comments:  No specialty comments available.  PMFS History: Patient Active Problem List   Diagnosis  Date Noted  . Idiopathic chronic venous hypertension of left lower extremity with ulcer and inflammation ([place]) 10/22/2016  . Onychomycosis 10/22/2016  . Skin ulcer of left ankle, limited to breakdown of skin ([place]) 10/16/2016  . Diastolic CHF ([place]) Q000111Q  . Depression 07/05/2015  . Acute respiratory failure with hypoxia ([place]) 03/10/2015  . Venous (peripheral) insufficiency 01/26/2015  . Chronic throat clearing 10/11/2014  . CKD (chronic kidney disease), stage III 07/13/2014  . Overactive bladder 07/13/2014  . Syncope 11/17/2013  . Macular degeneration 06/19/2011  . Osteoarthritis 06/05/2009  . CARCINOMA, SKIN, SQUAMOUS CELL 05/26/2008  . Hypertension 06/17/2007  . Hyperlipidemia 02/22/2007  . GERD  (gastroesophageal reflux disease) 02/22/2007   Past Medical History:  Diagnosis Date  . CARCINOMA, SKIN, SQUAMOUS CELL 05/26/2008  . Colon cancer ([place]) Brother #2  . Emphysema of lung ([place]) Brother  . GERD 02/22/2007  . HYPERLIPIDEMIA 02/22/2007  . HYPERTENSION 06/17/2007  . MACULAR DEGENERATION 02/22/2007  . OSTEOARTHRITIS, HIP, RIGHT 06/05/2009  . Ovarian cancer [facility]) Mother     Family History  Problem Relation Age of Onset  . Breast cancer Daughter     Past Surgical History:  Procedure Laterality Date  . LAMINECTOMY  1988   Social History   Occupational History  . Not on file.   Social History Main Topics  . Smoking status: Former Research scientist (life sciences)  . Smokeless tobacco: Never Used  . Alcohol use No  . Drug use: Unknown  . Sexual activity: Not on file

## 2016-11-05 ENCOUNTER — Ambulatory Visit (INDEPENDENT_AMBULATORY_CARE_PROVIDER_SITE_OTHER): Payer: Medicare Other | Admitting: Orthopedic Surgery

## 2016-11-13 ENCOUNTER — Ambulatory Visit (INDEPENDENT_AMBULATORY_CARE_PROVIDER_SITE_OTHER): Payer: Medicare Other | Admitting: Orthopedic Surgery

## 2016-11-13 DIAGNOSIS — B351 Tinea unguium: Secondary | ICD-10-CM

## 2016-11-13 DIAGNOSIS — L97929 Non-pressure chronic ulcer of unspecified part of left lower leg with unspecified severity: Secondary | ICD-10-CM

## 2016-11-13 DIAGNOSIS — I87332 Chronic venous hypertension (idiopathic) with ulcer and inflammation of left lower extremity: Secondary | ICD-10-CM | POA: Diagnosis not present

## 2016-11-13 NOTE — Progress Notes (Signed)
Office Visit Note   Patient: Roger [name]           Date of Birth: [birth year]           MRN: [id number] Visit Date: 11/13/2016              Requested by: [name], MD [address],  [address] PCP: Garret Reddish, MD  No chief complaint on file.   HPI: Patient states that the wound is feeling better on the left leg complains of painful thickened discolored onychomycotic nails 10 which is unable safely trim on his own and complains of increasing swelling in the right lower extremity.  Assessment & Plan: Visit Diagnoses:  1. Onychomycosis   2. Idiopathic chronic venous hypertension of left lower extremity with ulcer and inflammation (HCC)     Plan: Nails were trimmed 10 without complications. Patient was given instructions to wear the compression stockings on both lower extremities.  Follow-Up Instructions: Return in about 2 weeks (around 11/27/2016).   Ortho Exam  Patient is alert, oriented, no adenopathy, well-dressed, normal affect, normal respiratory effort. Patient is ambulating in a wheelchair. Examination patient's venous stasis swelling the left leg is significant improved with wearing the compression stocking there is no redness no synovitis no drainage. There is a very thin callus over the wound laterally which has healed nicely without complications. Patient's right leg has redness discoloration and pitting edema with swelling but no open ulcers. Patient does not have a palpable pulse either dorsalis pedis and posterior tibial bilaterally. Patient has absent hair growth in his lower extremities has thickened discolored onychomycotic nails 10 which is unable to safely trim on exam due to his neuropathy he has pigment changes and discoloration in his legs with thin shiny skin in both lower extremities.  Imaging: No results found.  Labs: Lab Results  Component Value Date   HGBA1C 5.6 10/16/2016   REPTSTATUS 03/10/2015 FINAL 03/10/2015   REPTSTATUS 03/12/2015 FINAL 03/10/2015   GRAMSTAIN  03/10/2015    MODERATE WBC PRESENT,BOTH PMN AND MONONUCLEAR FEW SQUAMOUS EPITHELIAL CELLS PRESENT FEW YEAST RARE GRAM POSITIVE COCCI IN PAIRS Performed at [facility]  03/10/2015    MODERATE CANDIDA ALBICANS Performed at [facility]     Orders:  No orders of the defined types were placed in this encounter.  No orders of the defined types were placed in this encounter.    Procedures: No procedures performed  Clinical Data: No additional findings.  ROS: Review of Systems  Constitutional: Negative for chills and fever.  All other systems reviewed and are negative.   Objective: Vital Signs: There were no vitals taken for this visit.  Specialty Comments:  No specialty comments available.  PMFS History: Patient Active Problem List   Diagnosis Date Noted  . Idiopathic chronic venous hypertension of left lower extremity with ulcer and inflammation ([place]) 10/22/2016  . Onychomycosis 10/22/2016  . Skin ulcer of left ankle, limited to breakdown of skin ([place]) 10/16/2016  . Diastolic CHF ([place]) 59/56/3875  . Depression 07/05/2015  . Acute respiratory failure with hypoxia ([place]) 03/10/2015  . Venous (peripheral) insufficiency 01/26/2015  . Chronic throat clearing 10/11/2014  . CKD (chronic kidney disease), stage III 07/13/2014  . Overactive bladder 07/13/2014  . Syncope 11/17/2013  . Macular degeneration 06/19/2011  . Osteoarthritis 06/05/2009  . CARCINOMA, SKIN, SQUAMOUS CELL 05/26/2008  . Hypertension 06/17/2007  . Hyperlipidemia 02/22/2007  . GERD (gastroesophageal reflux disease) 02/22/2007  Past Medical History:  Diagnosis Date  . CARCINOMA, SKIN, SQUAMOUS CELL 05/26/2008  . Colon cancer ([place]) Brother #2  . Emphysema of lung ([place]) Brother  . GERD 02/22/2007  . HYPERLIPIDEMIA 02/22/2007  . HYPERTENSION 06/17/2007  . MACULAR DEGENERATION 02/22/2007  . OSTEOARTHRITIS, HIP, RIGHT 06/05/2009    . Ovarian cancer [facility]) Mother     Family History  Problem Relation Age of Onset  . Breast cancer Daughter     Past Surgical History:  Procedure Laterality Date  . LAMINECTOMY  1988   Social History   Occupational History  . Not on file.   Social History Main Topics  . Smoking status: Former Research scientist (life sciences)  . Smokeless tobacco: Never Used  . Alcohol use No  . Drug use: Unknown  . Sexual activity: Not on file

## 2016-11-27 ENCOUNTER — Encounter (INDEPENDENT_AMBULATORY_CARE_PROVIDER_SITE_OTHER): Payer: Self-pay

## 2016-11-27 ENCOUNTER — Ambulatory Visit (INDEPENDENT_AMBULATORY_CARE_PROVIDER_SITE_OTHER): Payer: Medicare Other | Admitting: Orthopedic Surgery

## 2016-11-27 ENCOUNTER — Encounter (INDEPENDENT_AMBULATORY_CARE_PROVIDER_SITE_OTHER): Payer: Self-pay | Admitting: Orthopedic Surgery

## 2016-11-27 DIAGNOSIS — I87332 Chronic venous hypertension (idiopathic) with ulcer and inflammation of left lower extremity: Secondary | ICD-10-CM

## 2016-11-27 DIAGNOSIS — L97929 Non-pressure chronic ulcer of unspecified part of left lower leg with unspecified severity: Principal | ICD-10-CM

## 2016-11-27 NOTE — Progress Notes (Signed)
Office Visit Note   Patient: Roger [name]           Date of Birth: August 24, 1920           MRN: [id number] Visit Date: 11/27/2016              Requested by: [name], MD [address], [place] [address] PCP: Garret Reddish, MD  Chief Complaint  Patient presents with  . Left Leg - Follow-up      HPI: Patient has mixed arterial venous insufficiency left lower extremity. Patient has no complaints of claudication pain. He has been wearing the medical compression sock for 4 weeks without removing the socket.  Assessment & Plan: Visit Diagnoses:  1. Idiopathic chronic venous hypertension of left lower extremity with ulcer and inflammation (HCC)     Plan: Recommended that he change the sock daily with a fresh sock wash the sock and cold or warm water and put in the dryer on [place] for 15 minutes. Discussed the importance of washing the stock and drying the sock to restore the elasticity.  Follow-Up Instructions: Return in about 4 weeks (around 12/25/2016).   Ortho Exam  Patient is alert, oriented, no adenopathy, well-dressed, normal affect, normal respiratory effort. Examination patient is ambulating in a wheelchair. He does not have palpable pulses his foot is cool to the touch but there are no ischemic or gangrenous changes. Patient has no claudication symptoms. The foot has pitting edema in the leg has pitting edema but this is better than last examination. The lateral ulcer has completely healed and does have a scab.   Imaging: No results found.  Labs: Lab Results  Component Value Date   HGBA1C 5.6 10/16/2016   REPTSTATUS 03/10/2015 FINAL 03/10/2015   REPTSTATUS 03/12/2015 FINAL 03/10/2015   GRAMSTAIN  03/10/2015    MODERATE WBC PRESENT,BOTH PMN AND MONONUCLEAR FEW SQUAMOUS EPITHELIAL CELLS PRESENT FEW YEAST RARE GRAM POSITIVE COCCI IN PAIRS Performed at [facility]  03/10/2015    MODERATE CANDIDA ALBICANS Performed at [facility]     Orders:  No orders of the defined types were placed in this encounter.  No orders of the defined types were placed in this encounter.    Procedures: No procedures performed  Clinical Data: No additional findings.  ROS:  All other systems negative, except as noted in the HPI. Review of Systems  Objective: Vital Signs: There were no vitals taken for this visit.  Specialty Comments:  No specialty comments available.  PMFS History: Patient Active Problem List   Diagnosis Date Noted  . Idiopathic chronic venous hypertension of left lower extremity with ulcer and inflammation ([place]) 10/22/2016  . Onychomycosis 10/22/2016  . Skin ulcer of left ankle, limited to breakdown of skin ([place]) 10/16/2016  . Diastolic CHF ([place]) 88/82/8003  . Depression 07/05/2015  . Acute respiratory failure with hypoxia ([place]) 03/10/2015  . Venous (peripheral) insufficiency 01/26/2015  . Chronic throat clearing 10/11/2014  . CKD (chronic kidney disease), stage III 07/13/2014  . Overactive bladder 07/13/2014  . Syncope 11/17/2013  . Macular degeneration 06/19/2011  . Osteoarthritis 06/05/2009  . CARCINOMA, SKIN, SQUAMOUS CELL 05/26/2008  . Hypertension 06/17/2007  . Hyperlipidemia 02/22/2007  . GERD (gastroesophageal reflux disease) 02/22/2007   Past Medical History:  Diagnosis Date  . CARCINOMA, SKIN, SQUAMOUS CELL 05/26/2008  . Colon cancer ([place]) Brother #2  . Emphysema of lung ([place]) Brother  . GERD 02/22/2007  . HYPERLIPIDEMIA 02/22/2007  .  HYPERTENSION 06/17/2007  . MACULAR DEGENERATION 02/22/2007  . OSTEOARTHRITIS, HIP, RIGHT 06/05/2009  . Ovarian cancer [facility]) Mother     Family History  Problem Relation Age of Onset  . Breast cancer Daughter     Past Surgical History:  Procedure Laterality Date  . LAMINECTOMY  1988   Social History   Occupational History  . Not on file.   Social History Main Topics  . Smoking status: Former Research scientist (life sciences)  . Smokeless tobacco: Never Used    . Alcohol use No  . Drug use: Unknown  . Sexual activity: Not on file

## 2016-12-01 ENCOUNTER — Ambulatory Visit (INDEPENDENT_AMBULATORY_CARE_PROVIDER_SITE_OTHER): Payer: Medicare Other | Admitting: Orthopedic Surgery

## 2016-12-23 ENCOUNTER — Other Ambulatory Visit: Payer: Self-pay | Admitting: Family Medicine

## 2016-12-24 ENCOUNTER — Ambulatory Visit (INDEPENDENT_AMBULATORY_CARE_PROVIDER_SITE_OTHER): Payer: Medicare Other | Admitting: Orthopedic Surgery

## 2016-12-26 ENCOUNTER — Ambulatory Visit (INDEPENDENT_AMBULATORY_CARE_PROVIDER_SITE_OTHER): Payer: Medicare Other | Admitting: Orthopedic Surgery

## 2016-12-29 ENCOUNTER — Ambulatory Visit (INDEPENDENT_AMBULATORY_CARE_PROVIDER_SITE_OTHER): Payer: Medicare Other | Admitting: Orthopedic Surgery

## 2016-12-29 ENCOUNTER — Encounter (INDEPENDENT_AMBULATORY_CARE_PROVIDER_SITE_OTHER): Payer: Self-pay | Admitting: Orthopedic Surgery

## 2016-12-29 VITALS — Ht 68.0 in | Wt 170.0 lb

## 2016-12-29 DIAGNOSIS — I87332 Chronic venous hypertension (idiopathic) with ulcer and inflammation of left lower extremity: Secondary | ICD-10-CM

## 2016-12-29 DIAGNOSIS — L97929 Non-pressure chronic ulcer of unspecified part of left lower leg with unspecified severity: Principal | ICD-10-CM

## 2016-12-29 NOTE — Progress Notes (Signed)
Office Visit Note   Patient: Roger [name]           Date of Birth: 11-27-1919           MRN: [id number] Visit Date: 12/29/2016              Requested by: [name], MD [id number], [place] [address] PCP: [name], MD  Chief Complaint  Patient presents with  . Left Leg - Follow-up      HPI: Patient is a 83 year old gentleman with venous stasis insufficiency ulcer lateral malleolus left ankle. Patient has been in compression stockings. He has no concerns no complaints at this time.  Assessment & Plan: Visit Diagnoses:  1. Idiopathic chronic venous hypertension of left lower extremity with ulcer and inflammation (HCC)     Plan: Recommend continue wearing the compression stocking daily. He is to wash his leg with soap and water he is not to soak his foot. He is use moisturizing lotion after washing he may get moisturizing lotion over the scab. Discussed the importance of elevation.  Follow-Up Instructions: Return in about 4 weeks (around 01/26/2017).   Ortho Exam  Patient is alert, oriented, no adenopathy, well-dressed, normal affect, normal respiratory effort. Patient is ambulating in a wheelchair. He still has venous stasis swelling but he is showing improvement. He has pitting edema but there is no cellulitis no dermatitis. The ulcer has completely healed he is a very small scab that's about 5 mm in diameter with no depth. There is no drainage there is no tenderness to palpation. The medical compression sock was reapplied.  Imaging: No results found.  Labs: Lab Results  Component Value Date   HGBA1C 5.6 10/16/2016   REPTSTATUS 03/10/2015 FINAL 03/10/2015   REPTSTATUS 03/12/2015 FINAL 03/10/2015   GRAMSTAIN  03/10/2015    MODERATE WBC PRESENT,BOTH PMN AND MONONUCLEAR FEW SQUAMOUS EPITHELIAL CELLS PRESENT FEW YEAST RARE GRAM POSITIVE COCCI IN PAIRS Performed at [facility]  03/10/2015    MODERATE CANDIDA  ALBICANS Performed at [facility]     Orders:  No orders of the defined types were placed in this encounter.  No orders of the defined types were placed in this encounter.    Procedures: No procedures performed  Clinical Data: No additional findings.  ROS:  All other systems negative, except as noted in the HPI. Review of Systems  Objective: Vital Signs: Ht 5\' 8"  (1.727 m)   Wt 170 lb (77.1 kg)   BMI 25.85 kg/m   Specialty Comments:  No specialty comments available.  PMFS History: Patient Active Problem List   Diagnosis Date Noted  . Idiopathic chronic venous hypertension of left lower extremity with ulcer and inflammation ([place]) 10/22/2016  . Onychomycosis 10/22/2016  . Skin ulcer of left ankle, limited to breakdown of skin ([place]) 10/16/2016  . Diastolic CHF ([place]) 82/99/3716  . Depression 07/05/2015  . Acute respiratory failure with hypoxia ([place]) 03/10/2015  . Venous (peripheral) insufficiency 01/26/2015  . Chronic throat clearing 10/11/2014  . CKD (chronic kidney disease), stage III 07/13/2014  . Overactive bladder 07/13/2014  . Syncope 11/17/2013  . Macular degeneration 06/19/2011  . Osteoarthritis 06/05/2009  . CARCINOMA, SKIN, SQUAMOUS CELL 05/26/2008  . Hypertension 06/17/2007  . Hyperlipidemia 02/22/2007  . GERD (gastroesophageal reflux disease) 02/22/2007   Past Medical History:  Diagnosis Date  . CARCINOMA, SKIN, SQUAMOUS CELL 05/26/2008  . Colon cancer ([place]) Brother #2  . Emphysema of lung (  [place]) Brother  . GERD 02/22/2007  . HYPERLIPIDEMIA 02/22/2007  . HYPERTENSION 06/17/2007  . MACULAR DEGENERATION 02/22/2007  . OSTEOARTHRITIS, HIP, RIGHT 06/05/2009  . Ovarian cancer [facility]) Mother     Family History  Problem Relation Age of Onset  . Breast cancer Daughter     Past Surgical History:  Procedure Laterality Date  . LAMINECTOMY  1988   Social History   Occupational History  . Not on file.   Social History Main Topics  . Smoking  status: Former Research scientist (life sciences)  . Smokeless tobacco: Never Used  . Alcohol use No  . Drug use: Unknown  . Sexual activity: Not on file

## 2017-01-01 ENCOUNTER — Other Ambulatory Visit: Payer: Self-pay | Admitting: Family Medicine

## 2017-01-05 ENCOUNTER — Other Ambulatory Visit: Payer: Self-pay | Admitting: Family Medicine

## 2017-01-26 ENCOUNTER — Ambulatory Visit (INDEPENDENT_AMBULATORY_CARE_PROVIDER_SITE_OTHER): Payer: Medicare Other | Admitting: Family Medicine

## 2017-01-26 ENCOUNTER — Encounter (INDEPENDENT_AMBULATORY_CARE_PROVIDER_SITE_OTHER): Payer: Self-pay | Admitting: Orthopedic Surgery

## 2017-01-26 ENCOUNTER — Encounter: Payer: Self-pay | Admitting: Family Medicine

## 2017-01-26 ENCOUNTER — Ambulatory Visit (INDEPENDENT_AMBULATORY_CARE_PROVIDER_SITE_OTHER): Payer: Medicare Other | Admitting: Orthopedic Surgery

## 2017-01-26 VITALS — Ht 68.0 in | Wt 171.0 lb

## 2017-01-26 VITALS — BP 130/82 | HR 76 | Temp 97.5°F | Ht 68.0 in | Wt 171.8 lb

## 2017-01-26 DIAGNOSIS — S41111A Laceration without foreign body of right upper arm, initial encounter: Secondary | ICD-10-CM

## 2017-01-26 DIAGNOSIS — I87332 Chronic venous hypertension (idiopathic) with ulcer and inflammation of left lower extremity: Secondary | ICD-10-CM | POA: Diagnosis not present

## 2017-01-26 DIAGNOSIS — L97321 Non-pressure chronic ulcer of left ankle limited to breakdown of skin: Secondary | ICD-10-CM

## 2017-01-26 DIAGNOSIS — I10 Essential (primary) hypertension: Secondary | ICD-10-CM | POA: Diagnosis not present

## 2017-01-26 DIAGNOSIS — M25561 Pain in right knee: Secondary | ICD-10-CM | POA: Diagnosis not present

## 2017-01-26 DIAGNOSIS — L97929 Non-pressure chronic ulcer of unspecified part of left lower leg with unspecified severity: Principal | ICD-10-CM

## 2017-01-26 DIAGNOSIS — M1711 Unilateral primary osteoarthritis, right knee: Secondary | ICD-10-CM | POA: Diagnosis not present

## 2017-01-26 MED ORDER — METHYLPREDNISOLONE ACETATE 80 MG/ML IJ SUSP
80.0000 mg | Freq: Once | INTRAMUSCULAR | Status: AC
  Administered 2017-01-26: 80 mg via INTRA_ARTICULAR

## 2017-01-26 NOTE — Progress Notes (Signed)
Subjective:  Roger [name] is a 83 y.o. year old very pleasant male patient who presents for/with See problem oriented charting ROS- some bleeding behind righ tarm after scratched off scab, walking less and using wheelchair more due to right knee pain, no fever or chills, lesion on leg is healing.    Past Medical History-  Patient Active Problem List   Diagnosis Date Noted  . Skin ulcer of left ankle, limited to breakdown of skin ([place]) 10/16/2016    Priority: High  . Diastolic CHF ([place]) 34/74/2595    Priority: High  . Acute respiratory failure with hypoxia ([place]) 03/10/2015    Priority: High  . Depression 07/05/2015    Priority: Medium  . Chronic throat clearing 10/11/2014    Priority: Medium  . CKD (chronic kidney disease), stage III 07/13/2014    Priority: Medium  . Overactive bladder 07/13/2014    Priority: Medium  . Syncope 11/17/2013    Priority: Medium  . Hypertension 06/17/2007    Priority: Medium  . Hyperlipidemia 02/22/2007    Priority: Medium  . Venous (peripheral) insufficiency 01/26/2015    Priority: Low  . Macular degeneration 06/19/2011    Priority: Low  . Osteoarthritis 06/05/2009    Priority: Low  . CARCINOMA, SKIN, SQUAMOUS CELL 05/26/2008    Priority: Low  . GERD (gastroesophageal reflux disease) 02/22/2007    Priority: Low  . Idiopathic chronic venous hypertension of left lower extremity with ulcer and inflammation ([place]) 10/22/2016  . Onychomycosis 10/22/2016    Medications- reviewed and updated Current Outpatient Prescriptions  Medication Sig Dispense Refill  . aspirin (ECOTRIN) 325 MG EC tablet Take 325 mg by mouth daily.    . fluticasone (FLONASE) 50 MCG/ACT nasal spray Place 2 sprays into both nostrils daily. 16 g 6  . furosemide (LASIX) 20 MG tablet TAKE 1 TABLET (20 MG TOTAL) BY MOUTH DAILY. 90 tablet 1  . guaiFENesin (MUCINEX) 600 MG 12 hr tablet Take 2 tablets (1,200 mg total) by mouth 2 (two) times daily. 60 tablet 0  . losartan (COZAAR)  100 MG tablet TAKE 1 TABLET BY MOUTH EVERY DAY 90 tablet 2  . lovastatin (MEVACOR) 40 MG tablet TAKE 1 TABLET BY MOUTH EVERY DAY 90 tablet 3  . Multiple Vitamins-Minerals (MULTIVITAMIN WITH MINERALS) tablet Take 1 tablet by mouth daily.    [name] omeprazole (PRILOSEC) 20 MG capsule TAKE ONE CAPSULE BY MOUTH TWICE A DAY BEFORE A MEAL 180 capsule 2  . oxybutynin (DITROPAN) 5 MG tablet Take 0.5 tablets (2.5 mg total) by mouth daily. 45 tablet 3  . sertraline (ZOLOFT) 25 MG tablet Take 1 tablet (25 mg total) by mouth daily. (Patient taking differently: Take 12.5 mg by mouth daily. ) 30 tablet 5  . UNABLE TO FIND Med Name: Med pass 120 mL by mouth twice daily for supplement     No current facility-administered medications for this visit.     Objective: BP 130/82 (BP Location: Left Arm, Patient Position: Sitting, Cuff Size: Normal)   Pulse 76   Temp 97.5 F (36.4 C) (Oral)   Ht 5\' 8"  (1.727 m)   Wt 171 lb 12.8 oz (77.9 kg)   SpO2 95%   BMI 26.12 kg/m  Gen: NAD, resting comfortably CV: RRR no murmurs rubs or gallops Lungs: CTAB no crackles, wheeze, rhonchi Abdomen: soft/nontender/nondistended/normal bowel sounds. No rebound or guarding.  Ext: not wearing compression stockings as has been discussed- he agrees to put on when he gets home- daughter can help Skin:  warm, dry Neuro: wheeled into room in wheelchair MSK: see below. Also there is a scabbed over area on back of right arm- not actively bleeding. Also has horned like lesion on right mid back (agrees to see his dermatologist for this)  Assessment/Plan:  Osteoarthritis S:  Patient continues to complain of right knee pain. States resolved for 6-8 weeks from injection for most part then gradually worsened again. He is back to using wheelchair most of time due to the pain whereas he walks with walker for at least 6 weeks after injection. It reduces his pain abotu 50% and current pain about 5-6/10. Has now had injections every 3-5 months at least  since September.   Once again-  From prior note "Other options limited (Tylenol- no help, NSaids- don't use with CKD, and heart risk, Tramadol/narcotics- constipation)."  O: once again has mild effusion. Minimal joint line tenderness  Consent obtained and verified. Sterile betadine prep. Furthur cleansed with alcohol. Topical analgesic spray: Ethyl chloride. Joint: Right Knee Approached in typical fashion with: anteromedial approach.  Completed without difficulty Meds: 3 cc lidocaine without epinephrine. 1 cc depo medrol 80mg /mL Needle: 25 gauage with 1.5 inch needle Aftercare instructions and Red flags advised.  A/P: Right knee OA with injection given today. Other options limited. Improves quality of life and mobility. Discussed 3-4 months soonest repeat. Discussed if is lasting shorter and shorter likely worth discontinuing. Prior a1c not elevated as well so likely reasonable to use especially at his age.  Lab Results  Component Value Date   HGBA1C 5.6 10/16/2016    Hypertension At goal with losartan 100mg  alone.   3-4 months reasonable. For quality of life- likely to return PRN  Meds ordered this encounter  Medications  . methylPREDNISolone acetate (DEPO-MEDROL) injection 80 mg   Return precautions advised.  Garret Reddish, MD

## 2017-01-26 NOTE — Assessment & Plan Note (Signed)
At goal with losartan 100mg  alone.

## 2017-01-26 NOTE — Assessment & Plan Note (Signed)
S:  Patient continues to complain of right knee pain. States resolved for 6-8 weeks from injection for most part then gradually worsened again. He is back to using wheelchair most of time due to the pain whereas he walks with walker for at least 6 weeks after injection. It reduces his pain abotu 50% and current pain about 5-6/10. Has now had injections every 3-5 months at least since September.   Once again-  From prior note "Other options limited (Tylenol- no help, NSaids- don't use with CKD, and heart risk, Tramadol/narcotics- constipation)."  O: once again has mild effusion. Minimal joint line tenderness  Consent obtained and verified. Sterile betadine prep. Furthur cleansed with alcohol. Topical analgesic spray: Ethyl chloride. Joint: Right Knee Approached in typical fashion with: anteromedial approach.  Completed without difficulty Meds: 3 cc lidocaine without epinephrine. 1 cc depo medrol 80mg /mL Needle: 25 gauage with 1.5 inch needle Aftercare instructions and Red flags advised.  A/P: Right knee OA with injection given today. Other options limited. Improves quality of life and mobility. Discussed 3-4 months soonest repeat. Discussed if is lasting shorter and shorter likely worth discontinuing. Prior a1c not elevated as well so likely reasonable to use especially at his age.  Lab Results  Component Value Date   HGBA1C 5.6 10/16/2016

## 2017-01-26 NOTE — Progress Notes (Signed)
Office Visit Note   Patient: Roger [name]           Date of Birth: 1920-06-17           MRN: [id number] Visit Date: 01/26/2017              Requested by: [name], MD [address] [place] [place], [place] [address] PCP: [name], MD  Chief Complaint  Patient presents with  . Left Ankle - Follow-up    Lateral ankle ulcer       HPI: Patient is a 83 year old gentleman with venous stasis insufficiency ulcer lateral malleolus left ankle. Patient has been in compression stockings, however is not wearing these today. Wife states these are difficult to don. In diabetic socks to day, cotton and loose. Also has a skin tear to right upper arm he would like seen.   Assessment & Plan: Visit Diagnoses:  1. Idiopathic chronic venous hypertension of left lower extremity with ulcer and inflammation (HCC)   2. Skin ulcer of left ankle, limited to breakdown of skin ([place])   3. Skin tear of right upper arm without complication, initial encounter     Plan: Recommend continue wearing the compression stocking daily. He is to wash his leg with soap and water he is not to soak his foot. He is use moisturizing lotion after washing he may get moisturizing lotion over the scab. Discussed the importance of elevation. Advised against taking him for a pedicure.   Follow-Up Instructions: Return in about 2 months (around 03/28/2017).   Ortho Exam  Patient is alert, oriented, no adenopathy, well-dressed, normal affect, normal respiratory effort. Patient is ambulating in a wheelchair.  To the right upper extremity there is an ulceration that is 2 cm in diameter, eschar was debrided. There is underlying granulation tissue. Ulcer has nearly healed. Underlying ulcer is 7 mm in diameter and superficial. No drainage, erythema or sign of infection.  He still has venous stasis swelling but he is showing improvement. He has pitting edema but there is no cellulitis no dermatitis. The ulcer is 3 mm in  diameter with 1 mm of depth. There is no drainage there is tenderness to palpation. An iodosorb bandage was applied. Imaging: No results found.  Labs: Lab Results  Component Value Date   HGBA1C 5.6 10/16/2016   REPTSTATUS 03/10/2015 FINAL 03/10/2015   REPTSTATUS 03/12/2015 FINAL 03/10/2015   GRAMSTAIN  03/10/2015    MODERATE WBC PRESENT,BOTH PMN AND MONONUCLEAR FEW SQUAMOUS EPITHELIAL CELLS PRESENT FEW YEAST RARE GRAM POSITIVE COCCI IN PAIRS Performed at [facility]  03/10/2015    MODERATE CANDIDA ALBICANS Performed at [facility]     Orders:  No orders of the defined types were placed in this encounter.  No orders of the defined types were placed in this encounter.    Procedures: No procedures performed  Clinical Data: No additional findings.  ROS:  All other systems negative, except as noted in the HPI. Review of Systems  Constitutional: Negative for chills and fever.  Skin: Positive for wound. Negative for color change.    Objective: Vital Signs: Ht 5\' 8"  (1.727 m)   Wt 171 lb (77.6 kg)   BMI 26.00 kg/m   Specialty Comments:  No specialty comments available.  PMFS History: Patient Active Problem List   Diagnosis Date Noted  . Idiopathic chronic venous hypertension of left lower extremity with ulcer and inflammation ([place]) 10/22/2016  . Onychomycosis 10/22/2016  .  Skin ulcer of left ankle, limited to breakdown of skin ([place]) 10/16/2016  . Diastolic CHF ([place]) 07/62/2633  . Depression 07/05/2015  . Acute respiratory failure with hypoxia ([place]) 03/10/2015  . Venous (peripheral) insufficiency 01/26/2015  . Chronic throat clearing 10/11/2014  . CKD (chronic kidney disease), stage III 07/13/2014  . Overactive bladder 07/13/2014  . Syncope 11/17/2013  . Macular degeneration 06/19/2011  . Osteoarthritis 06/05/2009  . CARCINOMA, SKIN, SQUAMOUS CELL 05/26/2008  . Hypertension 06/17/2007  . Hyperlipidemia 02/22/2007  . GERD  (gastroesophageal reflux disease) 02/22/2007   Past Medical History:  Diagnosis Date  . CARCINOMA, SKIN, SQUAMOUS CELL 05/26/2008  . Colon cancer ([place]) Brother #2  . Emphysema of lung ([place]) Brother  . GERD 02/22/2007  . HYPERLIPIDEMIA 02/22/2007  . HYPERTENSION 06/17/2007  . MACULAR DEGENERATION 02/22/2007  . OSTEOARTHRITIS, HIP, RIGHT 06/05/2009  . Ovarian cancer [facility]) Mother     Family History  Problem Relation Age of Onset  . Breast cancer Daughter     Past Surgical History:  Procedure Laterality Date  . LAMINECTOMY  1988   Social History   Occupational History  . Not on file.   Social History Main Topics  . Smoking status: Former Research scientist (life sciences)  . Smokeless tobacco: Never Used  . Alcohol use No  . Drug use: Unknown  . Sexual activity: Not on file

## 2017-01-26 NOTE — Patient Instructions (Signed)
Glad the leg is doing better!   I would see dermatology for spot on the back and right arm  R knee arthritis  Glad this helped you 6-8 weeks  Should kick in within 48 hours  May have some increased pain tonight, can ice knee if needed  Follow up if worsening pain, swelling, redness around knee  Hopeful this helps you for at least 6 weeks- if it is helping less than that we may need to stop

## 2017-02-16 ENCOUNTER — Ambulatory Visit (INDEPENDENT_AMBULATORY_CARE_PROVIDER_SITE_OTHER): Payer: Medicare Other | Admitting: Internal Medicine

## 2017-02-16 ENCOUNTER — Encounter: Payer: Self-pay | Admitting: Internal Medicine

## 2017-02-16 VITALS — BP 118/62 | HR 89 | Temp 98.0°F | Wt 168.8 lb

## 2017-02-16 DIAGNOSIS — I10 Essential (primary) hypertension: Secondary | ICD-10-CM

## 2017-02-16 DIAGNOSIS — H1013 Acute atopic conjunctivitis, bilateral: Secondary | ICD-10-CM | POA: Diagnosis not present

## 2017-02-16 MED ORDER — AZELASTINE HCL 0.05 % OP SOLN: 1.0000 [drp] | Freq: Two times a day (BID) | OPHTHALMIC | 12 refills | Status: DC

## 2017-02-16 NOTE — Progress Notes (Signed)
Subjective:    Patient ID: Roger [name], male    DOB: [birth year], 83 y.o.   MRN: [id number]  HPI  83 year old patient has a history of essential hypertension.  He also has a history of allergic rhinitis and does take fluticasone nasal spray.  For the past 3 days he has had red itchy eyes with some exudate.  He has been using fluticasone nasal spray as well as a saline I irrigation.  Today his eyes feel much better are less red and has no persistent itching  Past Medical History:  Diagnosis Date  . CARCINOMA, SKIN, SQUAMOUS CELL 05/26/2008  . Colon cancer ([place]) Brother #2  . Emphysema of lung ([place]) Brother  . GERD 02/22/2007  . HYPERLIPIDEMIA 02/22/2007  . HYPERTENSION 06/17/2007  . MACULAR DEGENERATION 02/22/2007  . OSTEOARTHRITIS, HIP, RIGHT 06/05/2009  . Ovarian cancer [facility]) Mother      Social History   Social History  . Marital status: Married    Spouse name: N/A  . Number of children: N/A  . Years of education: N/A   Occupational History  . Not on file.   Social History Main Topics  . Smoking status: Former Research scientist (life sciences)  . Smokeless tobacco: Never Used  . Alcohol use No  . Drug use: Unknown  . Sexual activity: Not on file   Other Topics Concern  . Not on file   Social History Narrative   Married (wife Wendee Copp in [place] practice) 1947. 2 [place] oldest daughter to breast cancer. 4 grandkids.       Patient and wife live with daughter. In basement-with lift.        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    Full Code.           Past Surgical History:  Procedure Laterality Date  . LAMINECTOMY  1988    Family History  Problem Relation Age of Onset  . Breast cancer Daughter     Allergies  Allergen Reactions  . Amlodipine Besylate     REACTION: swelling of feet  . Aspirin     REACTION: GI upset can tolerate ecotrin  . Hydrocodone     GI upset    Current Outpatient Prescriptions on File Prior to Visit  Medication Sig Dispense  Refill  . aspirin (ECOTRIN) 325 MG EC tablet Take 325 mg by mouth daily.    . fluticasone (FLONASE) 50 MCG/ACT nasal spray Place 2 sprays into both nostrils daily. 16 g 6  . furosemide (LASIX) 20 MG tablet TAKE 1 TABLET (20 MG TOTAL) BY MOUTH DAILY. 90 tablet 1  . guaiFENesin (MUCINEX) 600 MG 12 hr tablet Take 2 tablets (1,200 mg total) by mouth 2 (two) times daily. 60 tablet 0  . losartan (COZAAR) 100 MG tablet TAKE 1 TABLET BY MOUTH EVERY DAY 90 tablet 2  . lovastatin (MEVACOR) 40 MG tablet TAKE 1 TABLET BY MOUTH EVERY DAY 90 tablet 3  . Multiple Vitamins-Minerals (MULTIVITAMIN WITH MINERALS) tablet Take 1 tablet by mouth daily.    [name] omeprazole (PRILOSEC) 20 MG capsule TAKE ONE CAPSULE BY MOUTH TWICE A DAY BEFORE A MEAL 180 capsule 2  . oxybutynin (DITROPAN) 5 MG tablet Take 0.5 tablets (2.5 mg total) by mouth daily. 45 tablet 3  . sertraline (ZOLOFT) 25 MG tablet Take 1 tablet (25 mg total) by mouth daily. (Patient taking differently: Take 12.5 mg by mouth daily. ) 30 tablet 5  . UNABLE TO FIND Med Name: Med  pass 120 mL by mouth twice daily for supplement    . [DISCONTINUED] diltiazem (CARDIZEM CD) 180 MG 24 hr capsule Take 180 mg by mouth daily.     . [DISCONTINUED] hydrochlorothiazide (MICROZIDE) 12.5 MG capsule Take 1 capsule (12.5 mg total) by mouth daily. 30 capsule 11   No current facility-administered medications on file prior to visit.     BP 118/62 (BP Location: Left Arm, Patient Position: Sitting, Cuff Size: Normal)   Pulse 89   Temp 98 F (36.7 C) (Oral)   Wt 168 lb 12.8 oz (76.6 kg)   SpO2 93%   BMI 25.67 kg/m     Review of Systems  Constitutional: Negative for appetite change, chills, fatigue and fever.  HENT: Negative for congestion, dental problem, ear pain, hearing loss, sore throat, tinnitus, trouble swallowing and voice change.   Eyes: Positive for discharge, redness and itching. Negative for pain and visual disturbance.  Respiratory: Negative for cough, chest  tightness, wheezing and stridor.   Cardiovascular: Negative for chest pain, palpitations and leg swelling.  Gastrointestinal: Negative for abdominal distention, abdominal pain, blood in stool, constipation, diarrhea, nausea and vomiting.  Genitourinary: Negative for difficulty urinating, discharge, flank pain, genital sores, hematuria and urgency.  Musculoskeletal: Negative for arthralgias, back pain, gait problem, joint swelling, myalgias and neck stiffness.  Skin: Negative for rash.  Neurological: Negative for dizziness, syncope, speech difficulty, weakness, numbness and headaches.  Hematological: Negative for adenopathy. Does not bruise/bleed easily.  Psychiatric/Behavioral: Negative for behavioral problems and dysphoric mood. The patient is not nervous/anxious.        Objective:   Physical Exam  Constitutional: He appears well-developed and well-nourished. No distress.  Eyes: EOM are normal. Pupils are equal, round, and reactive to light.  Mild conjunctival injection bilaterally Dry crusts involving the lower lids bilaterally    Musculoskeletal: He exhibits edema.          Assessment & Plan:   Resolving allergic conjunctivitis.  Will continue fluticasone nasal spray was given a prescription for Optivar if needed in the future Essential hypertension, stable  Harvey Matlack Pilar Plate

## 2017-02-16 NOTE — Patient Instructions (Addendum)
WE NOW OFFER   [facility] FAST TRACK!!!  SAME DAY Appointments for ACUTE CARE  Such as: Sprains, Injuries, cuts, abrasions, rashes, muscle pain, joint pain, back pain Colds, flu, sore throats, headache, allergies, cough, fever  Ear pain, sinus and eye infections Abdominal pain, nausea, vomiting, diarrhea, upset stomach Animal/insect bites  3 Easy Ways to Schedule: Walk-In Scheduling Call in scheduling Mychart Sign-up: [email or web address]         Allergic Conjunctivitis A clear membrane (conjunctiva) covers the white part of your eye and the inner surface of your eyelid. Allergic conjunctivitis happens when this membrane has inflammation. This is caused by allergies. Common causes of allergic reactions (allergens)include:  Outdoor allergens, such as: ? Pollen. ? Grass and weeds. ? Mold spores.  Indoor allergens, such as: ? Dust. ? Smoke. ? Mold. ? Pet dander. ? Animal hair.  This condition can make your eye red or pink. It can also make your eye feel itchy. This condition cannot be spread from one person to another person (is not contagious). Follow these instructions at home:  Try not to be around things that you are allergic to.  Take or apply over-the-counter and prescription medicines only as told by your doctor. These include any eye drops.  Place a cool, clean washcloth on your eye for 10-20 minutes. Do this 3-4 times a day.  Do not touch or rub your eyes.  Do not wear contact lenses until the inflammation is gone. Wear glasses instead.  Do not wear eye makeup until the inflammation is gone.  Keep all follow-up visits as told by your doctor. This is important. Contact a doctor if:  Your symptoms get worse.  Your symptoms do not get better with treatment.  You have mild eye pain.  You are sensitive to light,  You have spots or blisters on your eyes.  You have pus coming from your eye.  You have a fever. Get help right  away if:  You have redness, swelling, or other symptoms in only one eye.  Your vision is blurry.  You have vision changes.  You have very bad eye pain. Summary  Allergic conjunctivitis is caused by allergies. It can make your eye red or pink, and it can make your eye feel itchy.  This condition cannot be spread from one person to another person (is not contagious).  Try not to be around things that you are allergic to.  Take or apply over-the-counter and prescription medicines only as told by your doctor. These include any eye drops.  Contact your doctor if your symptoms get worse or they do not get better with treatment. This information is not intended to replace advice given to you by your health care provider. Make sure you discuss any questions you have with your health care provider. Document Released: 01/29/2010 Document Revised: 04/04/2016 Document Reviewed: 04/04/2016 Elsevier Interactive Patient Education  2017 [employer].

## 2017-02-24 ENCOUNTER — Other Ambulatory Visit: Payer: Self-pay | Admitting: Family Medicine

## 2017-03-02 ENCOUNTER — Other Ambulatory Visit: Payer: Self-pay | Admitting: Family Medicine

## 2017-03-02 NOTE — Telephone Encounter (Signed)
Pharmacy requests a 90 day supply.  Please send in.

## 2017-03-03 MED ORDER — SERTRALINE HCL 25 MG PO TABS: 25.0000 mg | ORAL_TABLET | Freq: Every day | ORAL | 1 refills | Status: DC

## 2017-03-30 ENCOUNTER — Ambulatory Visit (INDEPENDENT_AMBULATORY_CARE_PROVIDER_SITE_OTHER): Payer: Medicare Other | Admitting: Orthopedic Surgery

## 2017-03-30 ENCOUNTER — Encounter (INDEPENDENT_AMBULATORY_CARE_PROVIDER_SITE_OTHER): Payer: Self-pay | Admitting: Orthopedic Surgery

## 2017-03-30 DIAGNOSIS — I87332 Chronic venous hypertension (idiopathic) with ulcer and inflammation of left lower extremity: Secondary | ICD-10-CM

## 2017-03-30 DIAGNOSIS — L97929 Non-pressure chronic ulcer of unspecified part of left lower leg with unspecified severity: Principal | ICD-10-CM

## 2017-03-30 NOTE — Progress Notes (Signed)
Office Visit Note   Patient: Roger [name]           Date of Birth: 11-29-1919           MRN: [id number] Visit Date: 03/30/2017              Requested by: [name], MD [name], [place] [address] PCP: [name], MD  Chief Complaint  Patient presents with  . Left Ankle - Follow-up      HPI: Patient presents in follow-up for venous stasis insufficiency ulcers left lower extremity. Patient states that the stock pulled off a scab. Patient wants to know when he'll be safe to resume regular activities.  Assessment & Plan: Visit Diagnoses:  1. Idiopathic chronic venous hypertension of left lower extremity with ulcer and inflammation (HCC)     Plan: Discussed that we could proceed with the medical compression socks as prescribed or proceed with a 3 layer medical compression wrap which would be changed weekly. Patient states that he cannot come biweekly to have a wrap changed. Patient states he has difficulty getting his medical compression socks on. Discussed that he could go to the medical supply store to obtain a wire frame that would assist in putting the sock on or we could apply the socket in the office he did not bring it with him. Patient states that he will try to be compliant with elevation wearing the compression stocking and follow-up in 3 weeks.  Follow-Up Instructions: Return in about 3 weeks (around 04/20/2017).   Ortho Exam  Patient is alert, oriented, no adenopathy, well-dressed, normal affect, normal respiratory effort. Examination patient continues with his legs dependent there is daily pitting edema in both lower extremities with no cellulitis dermatitis or open ulcers or drainage in the left lower extremity. The ulcer has completely healed. He also has onychomycotic nails and a very faint fungal infection from a maceration from his cotton socks.  Imaging: No results found.  Labs: Lab Results  Component Value Date   HGBA1C 5.6  10/16/2016   REPTSTATUS 03/10/2015 FINAL 03/10/2015   REPTSTATUS 03/12/2015 FINAL 03/10/2015   GRAMSTAIN  03/10/2015    MODERATE WBC PRESENT,BOTH PMN AND MONONUCLEAR FEW SQUAMOUS EPITHELIAL CELLS PRESENT FEW YEAST RARE GRAM POSITIVE COCCI IN PAIRS Performed at [facility]  03/10/2015    MODERATE CANDIDA ALBICANS Performed at [facility]     Orders:  No orders of the defined types were placed in this encounter.  No orders of the defined types were placed in this encounter.    Procedures: No procedures performed  Clinical Data: No additional findings.  ROS:  All other systems negative, except as noted in the HPI. Review of Systems  Objective: Vital Signs: There were no vitals taken for this visit.  Specialty Comments:  No specialty comments available.  PMFS History: Patient Active Problem List   Diagnosis Date Noted  . Idiopathic chronic venous hypertension of left lower extremity with ulcer and inflammation ([place]) 10/22/2016  . Onychomycosis 10/22/2016  . Skin ulcer of left ankle, limited to breakdown of skin ([place]) 10/16/2016  . Diastolic CHF ([place]) 08/65/7846  . Depression 07/05/2015  . Acute respiratory failure with hypoxia ([place]) 03/10/2015  . Venous (peripheral) insufficiency 01/26/2015  . Chronic throat clearing 10/11/2014  . CKD (chronic kidney disease), stage III 07/13/2014  . Overactive bladder 07/13/2014  . Syncope 11/17/2013  . Macular degeneration 06/19/2011  . Osteoarthritis 06/05/2009  .  CARCINOMA, SKIN, SQUAMOUS CELL 05/26/2008  . Hypertension 06/17/2007  . Hyperlipidemia 02/22/2007  . GERD (gastroesophageal reflux disease) 02/22/2007   Past Medical History:  Diagnosis Date  . CARCINOMA, SKIN, SQUAMOUS CELL 05/26/2008  . Colon cancer ([place]) Brother #2  . Emphysema of lung ([place]) Brother  . GERD 02/22/2007  . HYPERLIPIDEMIA 02/22/2007  . HYPERTENSION 06/17/2007  . MACULAR DEGENERATION 02/22/2007  . OSTEOARTHRITIS, HIP,  RIGHT 06/05/2009  . Ovarian cancer [facility]) Mother     Family History  Problem Relation Age of Onset  . Breast cancer Daughter     Past Surgical History:  Procedure Laterality Date  . LAMINECTOMY  1988   Social History   Occupational History  . Not on file.   Social History Main Topics  . Smoking status: Former Research scientist (life sciences)  . Smokeless tobacco: Never Used  . Alcohol use No  . Drug use: Unknown  . Sexual activity: Not on file

## 2017-04-17 ENCOUNTER — Encounter: Payer: Self-pay | Admitting: Family Medicine

## 2017-04-17 ENCOUNTER — Ambulatory Visit (INDEPENDENT_AMBULATORY_CARE_PROVIDER_SITE_OTHER): Payer: Medicare Other | Admitting: Family Medicine

## 2017-04-17 VITALS — BP 110/78 | HR 66 | Temp 98.0°F | Wt 171.0 lb

## 2017-04-17 DIAGNOSIS — N3281 Overactive bladder: Secondary | ICD-10-CM

## 2017-04-17 DIAGNOSIS — R6889 Other general symptoms and signs: Secondary | ICD-10-CM | POA: Diagnosis not present

## 2017-04-17 DIAGNOSIS — I5030 Unspecified diastolic (congestive) heart failure: Secondary | ICD-10-CM | POA: Diagnosis not present

## 2017-04-17 DIAGNOSIS — R0989 Other specified symptoms and signs involving the circulatory and respiratory systems: Secondary | ICD-10-CM

## 2017-04-17 MED ORDER — MIRABEGRON ER 25 MG PO TB24: 25.0000 mg | ORAL_TABLET | Freq: Every day | ORAL | 5 refills | Status: DC

## 2017-04-17 NOTE — Progress Notes (Signed)
Subjective:  Roger [name] is a 83 y.o. year old very pleasant male patient who presents for/with See problem oriented charting ROS- hearing worse- improved with curette. Stable edema- wears compression stockigns most of the time but not today. No chest pain or worsening shortness of breath reported today.    Past Medical History-  Patient Active Problem List   Diagnosis Date Noted  . Skin ulcer of left ankle, limited to breakdown of skin ([place]) 10/16/2016    Priority: High  . Diastolic CHF ([place]) 14/97/0263    Priority: High  . Depression 07/05/2015    Priority: Medium  . History of pneumonia 03/10/2015    Priority: Medium  . Chronic throat clearing 10/11/2014    Priority: Medium  . CKD (chronic kidney disease), stage III 07/13/2014    Priority: Medium  . Overactive bladder 07/13/2014    Priority: Medium  . Syncope 11/17/2013    Priority: Medium  . Hypertension 06/17/2007    Priority: Medium  . Hyperlipidemia 02/22/2007    Priority: Medium  . Venous (peripheral) insufficiency 01/26/2015    Priority: Low  . Macular degeneration 06/19/2011    Priority: Low  . Osteoarthritis 06/05/2009    Priority: Low  . CARCINOMA, SKIN, SQUAMOUS CELL 05/26/2008    Priority: Low  . GERD (gastroesophageal reflux disease) 02/22/2007    Priority: Low  . Idiopathic chronic venous hypertension of left lower extremity with ulcer and inflammation ([place]) 10/22/2016  . Onychomycosis 10/22/2016    Medications- reviewed and updated Current Outpatient Prescriptions  Medication Sig Dispense Refill  . aspirin (ECOTRIN) 325 MG EC tablet Take 325 mg by mouth daily.    . fluticasone (FLONASE) 50 MCG/ACT nasal spray Place 2 sprays into both nostrils daily. 16 g 6  . furosemide (LASIX) 20 MG tablet TAKE 1 TABLET (20 MG TOTAL) BY MOUTH DAILY. 90 tablet 1  . guaiFENesin (MUCINEX) 600 MG 12 hr tablet Take 2 tablets (1,200 mg total) by mouth 2 (two) times daily. 60 tablet 0  . losartan (COZAAR) 100 MG tablet  TAKE 1 TABLET BY MOUTH EVERY DAY 90 tablet 2  . lovastatin (MEVACOR) 40 MG tablet TAKE 1 TABLET BY MOUTH EVERY DAY 90 tablet 3  . Multiple Vitamins-Minerals (MULTIVITAMIN WITH MINERALS) tablet Take 1 tablet by mouth daily.    [name] omeprazole (PRILOSEC) 20 MG capsule TAKE ONE CAPSULE BY MOUTH TWICE A DAY BEFORE A MEAL 180 capsule 2  . oxybutynin (DITROPAN) 5 MG tablet Take 0.5 tablets (2.5 mg total) by mouth daily. 45 tablet 3  . sertraline (ZOLOFT) 25 MG tablet Take 1 tablet (25 mg total) by mouth daily. Pt taking differently (12.5 mg) by mouth daily 90 tablet 1  . UNABLE TO FIND Med Name: Med pass 120 mL by mouth twice daily for supplement     Objective: BP 110/78 (BP Location: Left Arm, Patient Position: Sitting, Cuff Size: Normal)   Pulse 66   Temp 98 F (36.7 C) (Oral)   Wt 171 lb (77.6 kg)   SpO2 94%   BMI 26.00 kg/m  Gen: NAD, resting comfortably Cerumen impaction left ear- removed with curette CV: RRR no murmurs rubs or gallops Lungs: CTAB no crackles, wheeze, rhonchi Ext: no edema Skin: warm, dry   Assessment/Plan:  Overactive bladder S: controlled with oxybutynin 2.5mg  once a day. He does deal with Dry mouth, dry skin. Anticholinergic risks discussed  A/P: we will trial mybetriq 25mg  and follow up 1 month to reassess.    Chronic throat clearing  S: better once he came off zyrtec. mucinex also helping 1 tab twice a day.  A/P: he asks about taking mucinex 2 tabs twice a day- I encouraged to keep at same dose and see hwo he does with myrbetriq instead of oxybutynin. Consider 2 tabs mucinex BID next time depending on progress.   Diastolic CHF ([place]) S: on lasix 20mg  daily.  Weight largely stable. No increased edema.  A/P: continue current rx.   1 month BP recheck on mybetriq (new)  Meds ordered this encounter  Medications  . mirabegron ER (MYRBETRIQ) 25 MG TB24 tablet    Sig: Take 1 tablet (25 mg total) by mouth daily.    Dispense:  30 tablet    Refill:  5    Return  precautions advised.  Garret Reddish, MD

## 2017-04-17 NOTE — Assessment & Plan Note (Signed)
S: on lasix 20mg  daily.  Weight largely stable. No increased edema.  A/P: continue current rx.

## 2017-04-17 NOTE — Patient Instructions (Signed)
Stop oxybutynin (coming off this should help with dry skin and dry mouth some)  Trial myrbetriq  Check in 1 month from now so we can recheck blood pressure in office

## 2017-04-17 NOTE — Assessment & Plan Note (Signed)
S: controlled with oxybutynin 2.5mg  once a day. He does deal with Dry mouth, dry skin. Anticholinergic risks discussed  A/P: we will trial mybetriq 25mg  and follow up 1 month to reassess.

## 2017-04-17 NOTE — Assessment & Plan Note (Addendum)
S: better once he came off zyrtec. mucinex also helping 1 tab twice a day.  A/P: he asks about taking mucinex 2 tabs twice a day- I encouraged to keep at same dose and see hwo he does with myrbetriq instead of oxybutynin. Consider 2 tabs mucinex BID next time depending on progress.

## 2017-05-11 ENCOUNTER — Ambulatory Visit (INDEPENDENT_AMBULATORY_CARE_PROVIDER_SITE_OTHER): Payer: Medicare Other | Admitting: Orthopedic Surgery

## 2017-05-11 ENCOUNTER — Encounter (INDEPENDENT_AMBULATORY_CARE_PROVIDER_SITE_OTHER): Payer: Self-pay | Admitting: Orthopedic Surgery

## 2017-05-11 DIAGNOSIS — L97929 Non-pressure chronic ulcer of unspecified part of left lower leg with unspecified severity: Principal | ICD-10-CM

## 2017-05-11 DIAGNOSIS — I87332 Chronic venous hypertension (idiopathic) with ulcer and inflammation of left lower extremity: Secondary | ICD-10-CM

## 2017-05-11 NOTE — Progress Notes (Signed)
Office Visit Note   Patient: Roger [name]           Date of Birth: 1920-07-12           MRN: [id number] Visit Date: 05/11/2017              Requested by: [name], MD [phone], [place] [address] PCP: [name], MD  Chief Complaint  Patient presents with  . Left Leg - Edema      HPI: Patient is a 83 year old gentleman who presents in follow-up for venous stasis ulcers left leg. Patient states that he cannot wear the black medical compression sock but he could wear the when he is currently wearing. Patient is concerned why the scabs continue to develop.  Assessment & Plan: Visit Diagnoses:  1. Idiopathic chronic venous hypertension of left lower extremity with ulcer and inflammation ([place])     Plan: We will have the patient try to wear the tighter medical compression stockings. Discussed the importance of compression with his venous insufficiency. Patient states he may try to wear socks around-the-clock if he can get these on. Discussed the importance of making sure there is no wrinkles to prevent skin breakdown.  Follow-Up Instructions: Return in about 4 weeks (around 06/08/2017).   Ortho Exam  Patient is alert, oriented, no adenopathy, well-dressed, normal affect, normal respiratory effort. Patient does have difficulty with ambulation. Examination he has pitting edema in both lower extremities. The ulcers on the left leg have healed there is very small scabs over these healed ulcers. There is pitting edema in both lower extremities up to the tibial tubercle. There is no cellulitis no drainage no signs of infection.  Imaging: No results found. No images are attached to the encounter.  Labs: Lab Results  Component Value Date   HGBA1C 5.6 10/16/2016   REPTSTATUS 03/10/2015 FINAL 03/10/2015   REPTSTATUS 03/12/2015 FINAL 03/10/2015   GRAMSTAIN  03/10/2015    MODERATE WBC PRESENT,BOTH PMN AND MONONUCLEAR FEW SQUAMOUS EPITHELIAL CELLS  PRESENT FEW YEAST RARE GRAM POSITIVE COCCI IN PAIRS Performed at [facility]  03/10/2015    MODERATE CANDIDA ALBICANS Performed at [facility]     Orders:  No orders of the defined types were placed in this encounter.  No orders of the defined types were placed in this encounter.    Procedures: No procedures performed  Clinical Data: No additional findings.  ROS:  All other systems negative, except as noted in the HPI. Review of Systems  Objective: Vital Signs: There were no vitals taken for this visit.  Specialty Comments:  No specialty comments available.  PMFS History: Patient Active Problem List   Diagnosis Date Noted  . Idiopathic chronic venous hypertension of left lower extremity with ulcer and inflammation ([place]) 10/22/2016  . Onychomycosis 10/22/2016  . Skin ulcer of left ankle, limited to breakdown of skin ([place]) 10/16/2016  . Diastolic CHF ([place]) 18/56/3149  . Depression 07/05/2015  . History of pneumonia 03/10/2015  . Venous (peripheral) insufficiency 01/26/2015  . Chronic throat clearing 10/11/2014  . CKD (chronic kidney disease), stage III 07/13/2014  . Overactive bladder 07/13/2014  . Syncope 11/17/2013  . Macular degeneration 06/19/2011  . Osteoarthritis 06/05/2009  . CARCINOMA, SKIN, SQUAMOUS CELL 05/26/2008  . Hypertension 06/17/2007  . Hyperlipidemia 02/22/2007  . GERD (gastroesophageal reflux disease) 02/22/2007   Past Medical History:  Diagnosis Date  . CARCINOMA, SKIN, SQUAMOUS CELL 05/26/2008  . Colon cancer (  [place]) Brother #2  . Emphysema of lung ([place]) Brother  . GERD 02/22/2007  . HYPERLIPIDEMIA 02/22/2007  . HYPERTENSION 06/17/2007  . MACULAR DEGENERATION 02/22/2007  . OSTEOARTHRITIS, HIP, RIGHT 06/05/2009  . Ovarian cancer [facility]) Mother     Family History  Problem Relation Age of Onset  . Breast cancer Daughter     Past Surgical History:  Procedure Laterality Date  . LAMINECTOMY  1988   Social  History   Occupational History  . Not on file.   Social History Main Topics  . Smoking status: Former Research scientist (life sciences)  . Smokeless tobacco: Never Used  . Alcohol use No  . Drug use: Unknown  . Sexual activity: Not on file

## 2017-05-25 ENCOUNTER — Encounter: Payer: Self-pay | Admitting: Family Medicine

## 2017-05-25 ENCOUNTER — Ambulatory Visit (INDEPENDENT_AMBULATORY_CARE_PROVIDER_SITE_OTHER): Payer: Medicare Other | Admitting: Family Medicine

## 2017-05-25 VITALS — BP 128/84 | HR 84 | Temp 98.0°F | Ht 68.0 in | Wt 170.4 lb

## 2017-05-25 DIAGNOSIS — R269 Unspecified abnormalities of gait and mobility: Secondary | ICD-10-CM | POA: Diagnosis not present

## 2017-05-25 DIAGNOSIS — R6889 Other general symptoms and signs: Secondary | ICD-10-CM

## 2017-05-25 DIAGNOSIS — I10 Essential (primary) hypertension: Secondary | ICD-10-CM | POA: Diagnosis not present

## 2017-05-25 DIAGNOSIS — Z23 Encounter for immunization: Secondary | ICD-10-CM | POA: Diagnosis not present

## 2017-05-25 DIAGNOSIS — N3281 Overactive bladder: Secondary | ICD-10-CM | POA: Diagnosis not present

## 2017-05-25 DIAGNOSIS — F324 Major depressive disorder, single episode, in partial remission: Secondary | ICD-10-CM | POA: Diagnosis not present

## 2017-05-25 DIAGNOSIS — R0989 Other specified symptoms and signs involving the circulatory and respiratory systems: Secondary | ICD-10-CM

## 2017-05-25 MED ORDER — SERTRALINE HCL 25 MG PO TABS: 25.0000 mg | ORAL_TABLET | Freq: Every day | ORAL | 5 refills | Status: DC

## 2017-05-25 MED ORDER — OXYBUTYNIN CHLORIDE 5 MG PO TABS: 2.5000 mg | ORAL_TABLET | Freq: Three times a day (TID) | ORAL | 3 refills | Status: DC

## 2017-05-25 MED ORDER — OXYBUTYNIN CHLORIDE 5 MG PO TABS: 2.5000 mg | ORAL_TABLET | Freq: Every day | ORAL | 3 refills | Status: DC

## 2017-05-25 MED ORDER — IRBESARTAN 150 MG PO TABS: 150.0000 mg | ORAL_TABLET | Freq: Every day | ORAL | 5 refills | Status: DC

## 2017-05-25 NOTE — Assessment & Plan Note (Signed)
started myrbetriq 25mg  last visit. Prior oxybutynin and tryin to reduce due to dry mouth, dry skin/anticholinergic risks. Unfortunately they could not afford this so back on oxybutynin.

## 2017-05-25 NOTE — Patient Instructions (Addendum)
Trial irbesartan 150mg  instead of losartan 100mg . See if this helps with cough.   We will call you within a week or two about your referral to home health for PT for gait disorder/falls. If you do not hear within 3 weeks, give [place] a call.   zoloft 25mg  - go to full tablet

## 2017-05-25 NOTE — Assessment & Plan Note (Signed)
S: prior improvement in thickness of nasal discharge and sputum with stopping zyrtec after seeing Dr. Lucia Gaskins- state thoughslightly improved has had long term issues that bothers him. Has cups of sputum in the house and nose runs like faucet at times per him. Compliant with mucinex twice a day, flonase A/P: trial different arb- irbesartan instead of losartan. Refer to ENT if no help by follow up in 4 weeks.

## 2017-05-25 NOTE — Assessment & Plan Note (Signed)
S: controlled on lasix 20mg  daily, losartan 100mg  BP Readings from Last 3 Encounters:  05/25/17 128/84  04/17/17 110/78  02/16/17 118/62  A/P: We discussed blood pressure goal of <140/90. Continue lasix- with chronic cough/throat clearing- we will trial irbesartan 150mg  instead of losartan 100mg 

## 2017-05-25 NOTE — Progress Notes (Signed)
Subjective:  Roger [name] is a 83 y.o. year old very pleasant male patient who presents for/with See problem oriented charting ROS- no chet pain. Some fatigue not worsening. Stable edema. Chronic cough without blood   Past Medical History-  Patient Active Problem List   Diagnosis Date Noted  . Skin ulcer of left ankle, limited to breakdown of skin ([place]) 10/16/2016    Priority: High  . Diastolic CHF ([place]) 29/79/8921    Priority: High  . Depression 07/05/2015    Priority: Medium  . History of pneumonia 03/10/2015    Priority: Medium  . Chronic throat clearing 10/11/2014    Priority: Medium  . CKD (chronic kidney disease), stage III ([place]) 07/13/2014    Priority: Medium  . Overactive bladder 07/13/2014    Priority: Medium  . Syncope 11/17/2013    Priority: Medium  . Hypertension 06/17/2007    Priority: Medium  . Hyperlipidemia 02/22/2007    Priority: Medium  . Venous (peripheral) insufficiency 01/26/2015    Priority: Low  . Macular degeneration 06/19/2011    Priority: Low  . Osteoarthritis 06/05/2009    Priority: Low  . CARCINOMA, SKIN, SQUAMOUS CELL 05/26/2008    Priority: Low  . GERD (gastroesophageal reflux disease) 02/22/2007    Priority: Low  . Idiopathic chronic venous hypertension of left lower extremity with ulcer and inflammation ([place]) 10/22/2016  . Onychomycosis 10/22/2016    Medications- reviewed and updated Current Outpatient Prescriptions  Medication Sig Dispense Refill  . aspirin (ECOTRIN) 325 MG EC tablet Take 325 mg by mouth daily.    . fluticasone (FLONASE) 50 MCG/ACT nasal spray Place 2 sprays into both nostrils daily. 16 g 6  . furosemide (LASIX) 20 MG tablet TAKE 1 TABLET (20 MG TOTAL) BY MOUTH DAILY. 90 tablet 1  . guaiFENesin (MUCINEX) 600 MG 12 hr tablet Take 2 tablets (1,200 mg total) by mouth 2 (two) times daily. 60 tablet 0  . lovastatin (MEVACOR) 40 MG tablet TAKE 1 TABLET BY MOUTH EVERY DAY 90 tablet 3  . Multiple Vitamins-Minerals  (MULTIVITAMIN WITH MINERALS) tablet Take 1 tablet by mouth daily.    [name] omeprazole (PRILOSEC) 20 MG capsule TAKE ONE CAPSULE BY MOUTH TWICE A DAY BEFORE A MEAL 180 capsule 2  . sertraline (ZOLOFT) 25 MG tablet Take 1 tablet (25 mg total) by mouth daily. 30 tablet 5  . UNABLE TO FIND Med Name: Med pass 120 mL by mouth twice daily for supplement    . irbesartan (AVAPRO) 150 MG tablet Take 1 tablet (150 mg total) by mouth daily. 30 tablet 5  . oxybutynin (DITROPAN) 5 MG tablet Take 0.5 tablets (2.5 mg total) by mouth daily. 45 tablet 3   No current facility-administered medications for this visit.     Objective: BP 128/84 (BP Location: Left Arm, Patient Position: Sitting, Cuff Size: Normal)   Pulse 84   Temp 98 F (36.7 C) (Oral)   Ht 5\' 8"  (1.727 m)   Wt 170 lb 6.4 oz (77.3 kg)   SpO2 92%   BMI 25.91 kg/m  Gen: NAD, resting comfortably No rhinorrhea CV: RRR no murmurs rubs or gallops Lungs: CTAB no crackles, wheeze, rhonchi Ext: no edema Skin: warm, dry Neuro: in wheelchair today as most visits.   Assessment/Plan:  Gait difficulty - Plan: Ambulatory referral to Home Health S: Had a fall within last week. Td 2010. Had scrape under right arm. Some gait imbalance. A/P: HHPT ordered for gait and balance training. Also seems to happen more  if he has not eaten a good meal so I advised not missing meals  Depression S: more agitation per patient and family. Compliant with 12.5mg  zoloft. Single episode- partial remission A/P: will increase zoloft to 25mg . Prior 12. 5mg  dose was helpful- had some agitation on 25mg  but will trial now that he has been on it longer- may be more used to this.   Hypertension S: controlled on lasix 20mg  daily, losartan 100mg  BP Readings from Last 3 Encounters:  05/25/17 128/84  04/17/17 110/78  02/16/17 118/62  A/P: We discussed blood pressure goal of <140/90. Continue lasix- with chronic cough/throat clearing- we will trial irbesartan 150mg  instead of  losartan 100mg    Chronic throat clearing S: prior improvement in thickness of nasal discharge and sputum with stopping zyrtec after seeing Dr. Lucia Gaskins- state thoughslightly improved has had long term issues that bothers him. Has cups of sputum in the house and nose runs like faucet at times per him. Compliant with mucinex twice a day, flonase A/P: trial different arb- irbesartan instead of losartan. Refer to ENT if no help by follow up in 4 weeks.    Overactive bladder  started myrbetriq 25mg  last visit. Prior oxybutynin and tryin to reduce due to dry mouth, dry skin/anticholinergic risks. Unfortunately they could not afford this so back on oxybutynin.    Future Appointments Date Time Provider [place]  06/08/2017 1:15 PM Newt Minion, MD [facility] None  06/22/2017 1:45 PM [name], MD [facility] None   Return in about 4 weeks (around 06/22/2017) for follow up- or sooner if needed.  Orders Placed This Encounter  Procedures  . Flu vaccine HIGH DOSE PF  . Ambulatory referral to Home Health    Referral Priority:   Routine    Referral Type:   Home Health Care    Referral Reason:   Specialty Services Required    Requested Specialty:   [place]    Number of Visits Requested:   1    Meds ordered this encounter  Medications  . DISCONTD: oxybutynin (DITROPAN) 5 MG tablet    Sig: Take 0.5 tablets (2.5 mg total) by mouth 3 (three) times daily.    Dispense:  45 tablet    Refill:  3  . oxybutynin (DITROPAN) 5 MG tablet    Sig: Take 0.5 tablets (2.5 mg total) by mouth daily.    Dispense:  45 tablet    Refill:  3  . irbesartan (AVAPRO) 150 MG tablet    Sig: Take 1 tablet (150 mg total) by mouth daily.    Dispense:  30 tablet    Refill:  5  . sertraline (ZOLOFT) 25 MG tablet    Sig: Take 1 tablet (25 mg total) by mouth daily.    Dispense:  30 tablet    Refill:  5    Return precautions advised.  Garret Reddish, MD

## 2017-05-25 NOTE — Assessment & Plan Note (Addendum)
S: more agitation per patient and family. Compliant with 12.5mg  zoloft. Single episode- partial remission A/P: will increase zoloft to 25mg . Prior 12. 5mg  dose was helpful- had some agitation on 25mg  but will trial now that he has been on it longer- may be more used to this.

## 2017-05-27 ENCOUNTER — Telehealth: Payer: Self-pay | Admitting: *Deleted

## 2017-05-27 DIAGNOSIS — N183 Chronic kidney disease, stage 3 (moderate): Secondary | ICD-10-CM | POA: Diagnosis not present

## 2017-05-27 DIAGNOSIS — R2681 Unsteadiness on feet: Secondary | ICD-10-CM | POA: Diagnosis not present

## 2017-05-27 DIAGNOSIS — Z9181 History of falling: Secondary | ICD-10-CM | POA: Diagnosis not present

## 2017-05-27 DIAGNOSIS — I130 Hypertensive heart and chronic kidney disease with heart failure and stage 1 through stage 4 chronic kidney disease, or unspecified chronic kidney disease: Secondary | ICD-10-CM | POA: Diagnosis not present

## 2017-05-27 DIAGNOSIS — I5030 Unspecified diastolic (congestive) heart failure: Secondary | ICD-10-CM | POA: Diagnosis not present

## 2017-05-27 DIAGNOSIS — M1990 Unspecified osteoarthritis, unspecified site: Secondary | ICD-10-CM | POA: Diagnosis not present

## 2017-05-27 DIAGNOSIS — H3530 Unspecified macular degeneration: Secondary | ICD-10-CM | POA: Diagnosis not present

## 2017-05-27 NOTE — Telephone Encounter (Signed)
Liji from [facility] physical therapist is calling stating she needs to give verbal orders to the provider. She is requesting a call back. Her call back number is [phone]. Please advise. Thank you.

## 2017-05-28 NOTE — Telephone Encounter (Signed)
Called and left a voicemail message asking for a return phone call 

## 2017-05-29 NOTE — Telephone Encounter (Signed)
Called and provided verbal orders for Home Health PT starting next week. 2x week for the next 6 weeks. This is to work on gait, balance, and strength

## 2017-06-01 DIAGNOSIS — H3530 Unspecified macular degeneration: Secondary | ICD-10-CM | POA: Diagnosis not present

## 2017-06-01 DIAGNOSIS — I5030 Unspecified diastolic (congestive) heart failure: Secondary | ICD-10-CM | POA: Diagnosis not present

## 2017-06-01 DIAGNOSIS — N183 Chronic kidney disease, stage 3 (moderate): Secondary | ICD-10-CM | POA: Diagnosis not present

## 2017-06-01 DIAGNOSIS — R2681 Unsteadiness on feet: Secondary | ICD-10-CM | POA: Diagnosis not present

## 2017-06-01 DIAGNOSIS — M1990 Unspecified osteoarthritis, unspecified site: Secondary | ICD-10-CM | POA: Diagnosis not present

## 2017-06-01 DIAGNOSIS — I130 Hypertensive heart and chronic kidney disease with heart failure and stage 1 through stage 4 chronic kidney disease, or unspecified chronic kidney disease: Secondary | ICD-10-CM | POA: Diagnosis not present

## 2017-06-01 DIAGNOSIS — Z9181 History of falling: Secondary | ICD-10-CM | POA: Diagnosis not present

## 2017-06-05 DIAGNOSIS — I5030 Unspecified diastolic (congestive) heart failure: Secondary | ICD-10-CM | POA: Diagnosis not present

## 2017-06-05 DIAGNOSIS — I130 Hypertensive heart and chronic kidney disease with heart failure and stage 1 through stage 4 chronic kidney disease, or unspecified chronic kidney disease: Secondary | ICD-10-CM | POA: Diagnosis not present

## 2017-06-05 DIAGNOSIS — Z9181 History of falling: Secondary | ICD-10-CM | POA: Diagnosis not present

## 2017-06-05 DIAGNOSIS — M1990 Unspecified osteoarthritis, unspecified site: Secondary | ICD-10-CM | POA: Diagnosis not present

## 2017-06-05 DIAGNOSIS — N183 Chronic kidney disease, stage 3 (moderate): Secondary | ICD-10-CM | POA: Diagnosis not present

## 2017-06-05 DIAGNOSIS — R2681 Unsteadiness on feet: Secondary | ICD-10-CM | POA: Diagnosis not present

## 2017-06-05 DIAGNOSIS — H3530 Unspecified macular degeneration: Secondary | ICD-10-CM | POA: Diagnosis not present

## 2017-06-08 ENCOUNTER — Ambulatory Visit (INDEPENDENT_AMBULATORY_CARE_PROVIDER_SITE_OTHER): Payer: Medicare Other | Admitting: Orthopedic Surgery

## 2017-06-09 DIAGNOSIS — M1990 Unspecified osteoarthritis, unspecified site: Secondary | ICD-10-CM | POA: Diagnosis not present

## 2017-06-09 DIAGNOSIS — I130 Hypertensive heart and chronic kidney disease with heart failure and stage 1 through stage 4 chronic kidney disease, or unspecified chronic kidney disease: Secondary | ICD-10-CM | POA: Diagnosis not present

## 2017-06-09 DIAGNOSIS — I5030 Unspecified diastolic (congestive) heart failure: Secondary | ICD-10-CM | POA: Diagnosis not present

## 2017-06-09 DIAGNOSIS — N183 Chronic kidney disease, stage 3 (moderate): Secondary | ICD-10-CM | POA: Diagnosis not present

## 2017-06-09 DIAGNOSIS — Z9181 History of falling: Secondary | ICD-10-CM | POA: Diagnosis not present

## 2017-06-09 DIAGNOSIS — H3530 Unspecified macular degeneration: Secondary | ICD-10-CM | POA: Diagnosis not present

## 2017-06-09 DIAGNOSIS — R2681 Unsteadiness on feet: Secondary | ICD-10-CM | POA: Diagnosis not present

## 2017-06-12 DIAGNOSIS — H3530 Unspecified macular degeneration: Secondary | ICD-10-CM | POA: Diagnosis not present

## 2017-06-12 DIAGNOSIS — I130 Hypertensive heart and chronic kidney disease with heart failure and stage 1 through stage 4 chronic kidney disease, or unspecified chronic kidney disease: Secondary | ICD-10-CM | POA: Diagnosis not present

## 2017-06-12 DIAGNOSIS — M1990 Unspecified osteoarthritis, unspecified site: Secondary | ICD-10-CM | POA: Diagnosis not present

## 2017-06-12 DIAGNOSIS — I5030 Unspecified diastolic (congestive) heart failure: Secondary | ICD-10-CM | POA: Diagnosis not present

## 2017-06-12 DIAGNOSIS — N183 Chronic kidney disease, stage 3 (moderate): Secondary | ICD-10-CM | POA: Diagnosis not present

## 2017-06-12 DIAGNOSIS — R2681 Unsteadiness on feet: Secondary | ICD-10-CM | POA: Diagnosis not present

## 2017-06-12 DIAGNOSIS — Z9181 History of falling: Secondary | ICD-10-CM | POA: Diagnosis not present

## 2017-06-16 DIAGNOSIS — H3530 Unspecified macular degeneration: Secondary | ICD-10-CM | POA: Diagnosis not present

## 2017-06-16 DIAGNOSIS — I5030 Unspecified diastolic (congestive) heart failure: Secondary | ICD-10-CM | POA: Diagnosis not present

## 2017-06-16 DIAGNOSIS — R2681 Unsteadiness on feet: Secondary | ICD-10-CM | POA: Diagnosis not present

## 2017-06-16 DIAGNOSIS — I130 Hypertensive heart and chronic kidney disease with heart failure and stage 1 through stage 4 chronic kidney disease, or unspecified chronic kidney disease: Secondary | ICD-10-CM | POA: Diagnosis not present

## 2017-06-16 DIAGNOSIS — M1990 Unspecified osteoarthritis, unspecified site: Secondary | ICD-10-CM | POA: Diagnosis not present

## 2017-06-16 DIAGNOSIS — N183 Chronic kidney disease, stage 3 (moderate): Secondary | ICD-10-CM | POA: Diagnosis not present

## 2017-06-16 DIAGNOSIS — Z9181 History of falling: Secondary | ICD-10-CM | POA: Diagnosis not present

## 2017-06-17 ENCOUNTER — Ambulatory Visit (INDEPENDENT_AMBULATORY_CARE_PROVIDER_SITE_OTHER): Payer: Medicare Other | Admitting: Orthopedic Surgery

## 2017-06-17 ENCOUNTER — Encounter (INDEPENDENT_AMBULATORY_CARE_PROVIDER_SITE_OTHER): Payer: Self-pay | Admitting: Orthopedic Surgery

## 2017-06-17 DIAGNOSIS — I87332 Chronic venous hypertension (idiopathic) with ulcer and inflammation of left lower extremity: Secondary | ICD-10-CM

## 2017-06-17 DIAGNOSIS — B351 Tinea unguium: Secondary | ICD-10-CM

## 2017-06-17 DIAGNOSIS — L97929 Non-pressure chronic ulcer of unspecified part of left lower leg with unspecified severity: Secondary | ICD-10-CM | POA: Diagnosis not present

## 2017-06-17 NOTE — Progress Notes (Signed)
Office Visit Note   Patient: Roger [name]           Date of Birth: 02/16/1920           MRN: [id number] Visit Date: 06/17/2017              Requested by: [name], MD [place], [place] [address] PCP: [name], MD  Chief Complaint  Patient presents with  . Left Ankle - Follow-up      HPI: Patient is a 83 year old gentleman who presents in follow-up for both lower extremities.  Patient states that the venous stasis ulcer has healed.  He states he has thickened discolored onychomycotic nails that he is unable to trim on his own.  Patient his wife and their medical assistant states that they have difficulty getting his socks on.  Assessment & Plan: Visit Diagnoses:  1. Idiopathic chronic venous hypertension of left lower extremity with ulcer and inflammation (HCC)   2. Onychomycosis     Plan: Recommended continue to wear compression stockings they have an old pair of stockings recommended going to [facility] for a new pair of compression stockings.  Recommended elevation while seated.  Follow-Up Instructions: Return in about 3 months (around 09/17/2017).   Ortho Exam  Patient is alert, oriented, no adenopathy, well-dressed, normal affect, normal respiratory effort. Examination patient is in a wheelchair.  Patient was given a prescription for a new lightweight wheelchair.  Examination of both lower extremities he has venous stasis changes with brawny skin color changes but there are no open ulcers no cellulitis no drainage no signs of infection.  There are no plantar ulcers.  [name] has thickened discolored onychomycotic nails.  After informed consent the nails were trimmed Y10 without complications.  There is no sign of paronychial infection.  Imaging: No results found. No images are attached to the encounter.  Labs: Lab Results  Component Value Date   HGBA1C 5.6 10/16/2016   REPTSTATUS 03/10/2015 FINAL 03/10/2015   REPTSTATUS 03/12/2015 FINAL 03/10/2015   GRAMSTAIN  03/10/2015    MODERATE WBC PRESENT,BOTH PMN AND MONONUCLEAR FEW SQUAMOUS EPITHELIAL CELLS PRESENT FEW YEAST RARE GRAM POSITIVE COCCI IN PAIRS Performed at [facility]  03/10/2015    MODERATE CANDIDA ALBICANS Performed at [facility]     Orders:  No orders of the defined types were placed in this encounter.  No orders of the defined types were placed in this encounter.    Procedures: No procedures performed  Clinical Data: No additional findings.  ROS:  All other systems negative, except as noted in the HPI. Review of Systems  Objective: Vital Signs: There were no vitals taken for this visit.  Specialty Comments:  No specialty comments available.  PMFS History: Patient Active Problem List   Diagnosis Date Noted  . Idiopathic chronic venous hypertension of left lower extremity with ulcer and inflammation ([place]) 10/22/2016  . Onychomycosis 10/22/2016  . Skin ulcer of left ankle, limited to breakdown of skin ([place]) 10/16/2016  . Diastolic CHF ([place]) 17/51/0258  . Depression 07/05/2015  . History of pneumonia 03/10/2015  . Venous (peripheral) insufficiency 01/26/2015  . Chronic throat clearing 10/11/2014  . CKD (chronic kidney disease), stage III ([place]) 07/13/2014  . Overactive bladder 07/13/2014  . Syncope 11/17/2013  . Macular degeneration 06/19/2011  . Osteoarthritis 06/05/2009  . CARCINOMA, SKIN, SQUAMOUS CELL 05/26/2008  . Hypertension 06/17/2007  . Hyperlipidemia 02/22/2007  . GERD (gastroesophageal reflux  disease) 02/22/2007   Past Medical History:  Diagnosis Date  . CARCINOMA, SKIN, SQUAMOUS CELL 05/26/2008  . Colon cancer ([place]) Brother #2  . Emphysema of lung ([place]) Brother  . GERD 02/22/2007  . HYPERLIPIDEMIA 02/22/2007  . HYPERTENSION 06/17/2007  . MACULAR DEGENERATION 02/22/2007  . OSTEOARTHRITIS, HIP, RIGHT 06/05/2009  . Ovarian cancer [facility]) Mother     Family History    Problem Relation Age of Onset  . Breast cancer Daughter     Past Surgical History:  Procedure Laterality Date  . LAMINECTOMY  1988   Social History   Occupational History  . Not on file.   Social History Main Topics  . Smoking status: Former Research scientist (life sciences)  . Smokeless tobacco: Never Used  . Alcohol use No  . Drug use: Unknown  . Sexual activity: Not on file

## 2017-06-18 DIAGNOSIS — I5030 Unspecified diastolic (congestive) heart failure: Secondary | ICD-10-CM | POA: Diagnosis not present

## 2017-06-18 DIAGNOSIS — N183 Chronic kidney disease, stage 3 (moderate): Secondary | ICD-10-CM | POA: Diagnosis not present

## 2017-06-18 DIAGNOSIS — Z9181 History of falling: Secondary | ICD-10-CM | POA: Diagnosis not present

## 2017-06-18 DIAGNOSIS — H3530 Unspecified macular degeneration: Secondary | ICD-10-CM | POA: Diagnosis not present

## 2017-06-18 DIAGNOSIS — I130 Hypertensive heart and chronic kidney disease with heart failure and stage 1 through stage 4 chronic kidney disease, or unspecified chronic kidney disease: Secondary | ICD-10-CM | POA: Diagnosis not present

## 2017-06-18 DIAGNOSIS — M1990 Unspecified osteoarthritis, unspecified site: Secondary | ICD-10-CM | POA: Diagnosis not present

## 2017-06-18 DIAGNOSIS — R2681 Unsteadiness on feet: Secondary | ICD-10-CM | POA: Diagnosis not present

## 2017-06-22 ENCOUNTER — Encounter: Payer: Self-pay | Admitting: Family Medicine

## 2017-06-22 ENCOUNTER — Ambulatory Visit (INDEPENDENT_AMBULATORY_CARE_PROVIDER_SITE_OTHER): Payer: Medicare Other | Admitting: Family Medicine

## 2017-06-22 VITALS — BP 100/70 | HR 76

## 2017-06-22 DIAGNOSIS — F325 Major depressive disorder, single episode, in full remission: Secondary | ICD-10-CM | POA: Diagnosis not present

## 2017-06-22 DIAGNOSIS — H1013 Acute atopic conjunctivitis, bilateral: Secondary | ICD-10-CM

## 2017-06-22 DIAGNOSIS — I10 Essential (primary) hypertension: Secondary | ICD-10-CM | POA: Diagnosis not present

## 2017-06-22 MED ORDER — IRBESARTAN 75 MG PO TABS: 75.0000 mg | ORAL_TABLET | Freq: Every day | ORAL | 5 refills | Status: DC

## 2017-06-22 MED ORDER — AZELASTINE HCL 0.05 % OP SOLN: 1.0000 [drp] | Freq: Two times a day (BID) | OPHTHALMIC | 0 refills | Status: DC

## 2017-06-22 NOTE — Assessment & Plan Note (Signed)
s: 3-4 days of bilateral red eyes. Itchy eyes and some clear drainage and sticky.  No blurry vision or pain with eye movement  This happened a few months  ago 02/16/17.  Patient saw Dr. Raliegh Ip and was given Rx for astelin eyedrops cleared it quickly.  A/P: Given brisk response to Astelin with similar initial presentation months ago, we agreed to retrial this.  I do not suspect orbital cellulitis.  Could be bacterial but with level of itchiness think allergic is most likely.

## 2017-06-22 NOTE — Assessment & Plan Note (Signed)
S: Patient states is single episode of depression is now in full remission with increase of Zoloft to 25 mg from 12.5 mg A/P: Continue current medication.  Get updated PHQ 9 at follow-up

## 2017-06-22 NOTE — Patient Instructions (Signed)
Glad the new blood pressure medicine is helping  Avoid rubbing eyes  Can use cool compresses on them for 5 minutes or so  Use astelin eye drops to help heal this up- if not improved within a few days- see [place] back. Particularly if symptoms worsen

## 2017-06-22 NOTE — Assessment & Plan Note (Signed)
S: controlled on irbesartan 150 mg and Lasix 20 mg.  Perhaps overcontrolled with some readings at home as low as reading today BP Readings from Last 3 Encounters:  06/22/17 100/70  05/25/17 128/84  04/17/17 110/78  A/P: We discussed blood pressure goal of <140/90.  Continue Lasix.  Due to potential overtreatment-decrease irbesartan to 75 mg and follow-up in 1 month

## 2017-06-22 NOTE — Progress Notes (Signed)
Subjective:  Roger [name] is a 83 y.o. year old very pleasant male patient who presents for/with See problem oriented charting ROS-no chest pain or shortness of breath reported.  Chronic cough has improved.  Continued edema.  Past Medical History-  Patient Active Problem List   Diagnosis Date Noted  . Skin ulcer of left ankle, limited to breakdown of skin ([place]) 10/16/2016    Priority: High  . Diastolic CHF ([place]) 63/78/5885    Priority: High  . Depression 07/05/2015    Priority: Medium  . History of pneumonia 03/10/2015    Priority: Medium  . Chronic throat clearing 10/11/2014    Priority: Medium  . CKD (chronic kidney disease), stage III ([place]) 07/13/2014    Priority: Medium  . Overactive bladder 07/13/2014    Priority: Medium  . Syncope 11/17/2013    Priority: Medium  . Hypertension 06/17/2007    Priority: Medium  . Hyperlipidemia 02/22/2007    Priority: Medium  . Venous (peripheral) insufficiency 01/26/2015    Priority: Low  . Macular degeneration 06/19/2011    Priority: Low  . Osteoarthritis 06/05/2009    Priority: Low  . CARCINOMA, SKIN, SQUAMOUS CELL 05/26/2008    Priority: Low  . GERD (gastroesophageal reflux disease) 02/22/2007    Priority: Low  . Allergic conjunctivitis of both eyes 06/22/2017  . Idiopathic chronic venous hypertension of left lower extremity with ulcer and inflammation ([place]) 10/22/2016  . Onychomycosis 10/22/2016    Medications- reviewed and updated Current Outpatient Prescriptions  Medication Sig Dispense Refill  . aspirin (ECOTRIN) 325 MG EC tablet Take 325 mg by mouth daily.    . fluticasone (FLONASE) 50 MCG/ACT nasal spray Place 2 sprays into both nostrils daily. 16 g 6  . furosemide (LASIX) 20 MG tablet TAKE 1 TABLET (20 MG TOTAL) BY MOUTH DAILY. 90 tablet 1  . guaiFENesin (MUCINEX) 600 MG 12 hr tablet Take 2 tablets (1,200 mg total) by mouth 2 (two) times daily. 60 tablet 0  . irbesartan (AVAPRO) 75 MG tablet Take 1 tablet (75 mg  total) by mouth daily. 30 tablet 5  . lovastatin (MEVACOR) 40 MG tablet TAKE 1 TABLET BY MOUTH EVERY DAY 90 tablet 3  . Multiple Vitamins-Minerals (MULTIVITAMIN WITH MINERALS) tablet Take 1 tablet by mouth daily.    [name] omeprazole (PRILOSEC) 20 MG capsule TAKE ONE CAPSULE BY MOUTH TWICE A DAY BEFORE A MEAL 180 capsule 2  . oxybutynin (DITROPAN) 5 MG tablet Take 0.5 tablets (2.5 mg total) by mouth daily. 45 tablet 3  . sertraline (ZOLOFT) 25 MG tablet Take 1 tablet (25 mg total) by mouth daily. 30 tablet 5  . UNABLE TO FIND Med Name: Med pass 120 mL by mouth twice daily for supplement    . azelastine (OPTIVAR) 0.05 % ophthalmic solution Place 1 drop into both eyes 2 (two) times daily. 6 mL 0   No current facility-administered medications for this visit.     Objective: BP 100/70   Pulse 76   SpO2 97%  Gen: NAD, resting comfortably PERRLA. Conjunctiva mildly erythematous. Sclera erythematous. Erythema around bialteral eyes- rubs many times during visit.  CV: RRR no murmurs rubs or gallops Lungs: CTAB no crackles, wheeze, rhonchi Ext: stable 1+ edema Skin: warm, dry, redness around eyes as noted Neuro: in wheelchair  Assessment/Plan:  Allergic conjunctivitis of both eyes s: 3-4 days of bilateral red eyes. Itchy eyes and some clear drainage and sticky.  No blurry vision or pain with eye movement  This happened a  few months  ago 02/16/17.  Patient saw Dr. Raliegh Ip and was given Rx for astelin eyedrops cleared it quickly.  A/P: Given brisk response to Astelin with similar initial presentation months ago, we agreed to retrial this.  I do not suspect orbital cellulitis.  Could be bacterial but with level of itchiness think allergic is most likely.  Hypertension S: controlled on irbesartan 150 mg and Lasix 20 mg.  Perhaps overcontrolled with some readings at home as low as reading today BP Readings from Last 3 Encounters:  06/22/17 100/70  05/25/17 128/84  04/17/17 110/78  A/P: We discussed blood  pressure goal of <140/90.  Continue Lasix.  Due to potential overtreatment-decrease irbesartan to 75 mg and follow-up in 1 month   Depression S: Patient states is single episode of depression is now in full remission with increase of Zoloft to 25 mg from 12.5 mg A/P: Continue current medication.  Get updated [place] 9 at follow-up   Future Appointments Date Time Provider [place]  09/21/2017 12:30 PM Newt Minion, MD [facility] None   1 month blood pressure check  Meds ordered this encounter  Medications  . azelastine (OPTIVAR) 0.05 % ophthalmic solution    Sig: Place 1 drop into both eyes 2 (two) times daily.    Dispense:  6 mL    Refill:  0  . irbesartan (AVAPRO) 75 MG tablet    Sig: Take 1 tablet (75 mg total) by mouth daily.    Dispense:  30 tablet    Refill:  5   Return precautions advised particularly for blurry vision, worsening eye symptoms, or failure to improve on Astelin eyedrops Garret Reddish, MD

## 2017-06-23 ENCOUNTER — Telehealth (INDEPENDENT_AMBULATORY_CARE_PROVIDER_SITE_OTHER): Payer: Self-pay | Admitting: Radiology

## 2017-06-23 ENCOUNTER — Telehealth: Payer: Self-pay | Admitting: Family Medicine

## 2017-06-23 NOTE — Telephone Encounter (Signed)
Please send 90 day rx for sertraline (ZOLOFT) 25 MG tablet [id number] [place], [place] - [address] [phone] (Phone) [phone] (Fax)

## 2017-06-23 NOTE — Telephone Encounter (Signed)
Patients daughter calling today, patient was seen in the office last week for recheck of wound and toe nail trim. Patient was up in the middle of the night, normally ambulates with wheelchair. Middle toenail had fallen off, there was a lot of bleeding, patient is on a blood thinner. He is too tired to come in for appointment today. Advised daughter to apply topical antibiotic ointment such as neosporin, with gauze and coban for compression to control bleeding. Minimize weightbearing, elevation until patient is feeling better and Dr. Sharol Given can examine the nail bed in the office. She will call to schedule this appointment.

## 2017-06-23 NOTE — Telephone Encounter (Signed)
Prescription was sent to pharmacy on 05/25/17 with refills. Received confirmation received from pharmacy

## 2017-06-24 DIAGNOSIS — I5030 Unspecified diastolic (congestive) heart failure: Secondary | ICD-10-CM | POA: Diagnosis not present

## 2017-06-24 DIAGNOSIS — M1990 Unspecified osteoarthritis, unspecified site: Secondary | ICD-10-CM | POA: Diagnosis not present

## 2017-06-24 DIAGNOSIS — Z9181 History of falling: Secondary | ICD-10-CM | POA: Diagnosis not present

## 2017-06-24 DIAGNOSIS — H3530 Unspecified macular degeneration: Secondary | ICD-10-CM | POA: Diagnosis not present

## 2017-06-24 DIAGNOSIS — I130 Hypertensive heart and chronic kidney disease with heart failure and stage 1 through stage 4 chronic kidney disease, or unspecified chronic kidney disease: Secondary | ICD-10-CM | POA: Diagnosis not present

## 2017-06-24 DIAGNOSIS — N183 Chronic kidney disease, stage 3 (moderate): Secondary | ICD-10-CM | POA: Diagnosis not present

## 2017-06-24 DIAGNOSIS — R2681 Unsteadiness on feet: Secondary | ICD-10-CM | POA: Diagnosis not present

## 2017-06-25 DIAGNOSIS — M1990 Unspecified osteoarthritis, unspecified site: Secondary | ICD-10-CM | POA: Diagnosis not present

## 2017-06-25 DIAGNOSIS — R2681 Unsteadiness on feet: Secondary | ICD-10-CM | POA: Diagnosis not present

## 2017-06-25 DIAGNOSIS — N183 Chronic kidney disease, stage 3 (moderate): Secondary | ICD-10-CM | POA: Diagnosis not present

## 2017-06-25 DIAGNOSIS — Z9181 History of falling: Secondary | ICD-10-CM | POA: Diagnosis not present

## 2017-06-25 DIAGNOSIS — I130 Hypertensive heart and chronic kidney disease with heart failure and stage 1 through stage 4 chronic kidney disease, or unspecified chronic kidney disease: Secondary | ICD-10-CM | POA: Diagnosis not present

## 2017-06-25 DIAGNOSIS — H3530 Unspecified macular degeneration: Secondary | ICD-10-CM | POA: Diagnosis not present

## 2017-06-25 DIAGNOSIS — I5030 Unspecified diastolic (congestive) heart failure: Secondary | ICD-10-CM | POA: Diagnosis not present

## 2017-07-01 ENCOUNTER — Other Ambulatory Visit: Payer: Self-pay

## 2017-07-01 DIAGNOSIS — I130 Hypertensive heart and chronic kidney disease with heart failure and stage 1 through stage 4 chronic kidney disease, or unspecified chronic kidney disease: Secondary | ICD-10-CM | POA: Diagnosis not present

## 2017-07-01 DIAGNOSIS — N183 Chronic kidney disease, stage 3 (moderate): Secondary | ICD-10-CM | POA: Diagnosis not present

## 2017-07-01 DIAGNOSIS — I5030 Unspecified diastolic (congestive) heart failure: Secondary | ICD-10-CM | POA: Diagnosis not present

## 2017-07-01 DIAGNOSIS — M1990 Unspecified osteoarthritis, unspecified site: Secondary | ICD-10-CM | POA: Diagnosis not present

## 2017-07-01 DIAGNOSIS — R2681 Unsteadiness on feet: Secondary | ICD-10-CM | POA: Diagnosis not present

## 2017-07-01 DIAGNOSIS — Z9181 History of falling: Secondary | ICD-10-CM | POA: Diagnosis not present

## 2017-07-01 DIAGNOSIS — H3530 Unspecified macular degeneration: Secondary | ICD-10-CM | POA: Diagnosis not present

## 2017-07-01 MED ORDER — FUROSEMIDE 20 MG PO TABS: 20.0000 mg | ORAL_TABLET | Freq: Every day | ORAL | 1 refills | Status: DC

## 2017-07-04 DIAGNOSIS — I130 Hypertensive heart and chronic kidney disease with heart failure and stage 1 through stage 4 chronic kidney disease, or unspecified chronic kidney disease: Secondary | ICD-10-CM | POA: Diagnosis not present

## 2017-07-04 DIAGNOSIS — N183 Chronic kidney disease, stage 3 (moderate): Secondary | ICD-10-CM | POA: Diagnosis not present

## 2017-07-04 DIAGNOSIS — I5030 Unspecified diastolic (congestive) heart failure: Secondary | ICD-10-CM | POA: Diagnosis not present

## 2017-07-04 DIAGNOSIS — R2681 Unsteadiness on feet: Secondary | ICD-10-CM | POA: Diagnosis not present

## 2017-07-04 DIAGNOSIS — H3530 Unspecified macular degeneration: Secondary | ICD-10-CM | POA: Diagnosis not present

## 2017-07-04 DIAGNOSIS — M1990 Unspecified osteoarthritis, unspecified site: Secondary | ICD-10-CM | POA: Diagnosis not present

## 2017-07-04 DIAGNOSIS — Z9181 History of falling: Secondary | ICD-10-CM | POA: Diagnosis not present

## 2017-07-06 ENCOUNTER — Other Ambulatory Visit: Payer: Self-pay

## 2017-07-06 MED ORDER — SERTRALINE HCL 25 MG PO TABS: 25.0000 mg | ORAL_TABLET | Freq: Every day | ORAL | 1 refills | Status: DC

## 2017-07-07 DIAGNOSIS — I5030 Unspecified diastolic (congestive) heart failure: Secondary | ICD-10-CM | POA: Diagnosis not present

## 2017-07-07 DIAGNOSIS — R2681 Unsteadiness on feet: Secondary | ICD-10-CM | POA: Diagnosis not present

## 2017-07-07 DIAGNOSIS — I130 Hypertensive heart and chronic kidney disease with heart failure and stage 1 through stage 4 chronic kidney disease, or unspecified chronic kidney disease: Secondary | ICD-10-CM | POA: Diagnosis not present

## 2017-07-07 DIAGNOSIS — H3530 Unspecified macular degeneration: Secondary | ICD-10-CM | POA: Diagnosis not present

## 2017-07-07 DIAGNOSIS — N183 Chronic kidney disease, stage 3 (moderate): Secondary | ICD-10-CM | POA: Diagnosis not present

## 2017-07-07 DIAGNOSIS — M1990 Unspecified osteoarthritis, unspecified site: Secondary | ICD-10-CM | POA: Diagnosis not present

## 2017-07-07 DIAGNOSIS — Z9181 History of falling: Secondary | ICD-10-CM | POA: Diagnosis not present

## 2017-07-10 DIAGNOSIS — R2681 Unsteadiness on feet: Secondary | ICD-10-CM | POA: Diagnosis not present

## 2017-07-10 DIAGNOSIS — I130 Hypertensive heart and chronic kidney disease with heart failure and stage 1 through stage 4 chronic kidney disease, or unspecified chronic kidney disease: Secondary | ICD-10-CM | POA: Diagnosis not present

## 2017-07-10 DIAGNOSIS — M1990 Unspecified osteoarthritis, unspecified site: Secondary | ICD-10-CM | POA: Diagnosis not present

## 2017-07-10 DIAGNOSIS — N183 Chronic kidney disease, stage 3 (moderate): Secondary | ICD-10-CM | POA: Diagnosis not present

## 2017-07-10 DIAGNOSIS — H3530 Unspecified macular degeneration: Secondary | ICD-10-CM | POA: Diagnosis not present

## 2017-07-10 DIAGNOSIS — Z9181 History of falling: Secondary | ICD-10-CM | POA: Diagnosis not present

## 2017-07-10 DIAGNOSIS — I5030 Unspecified diastolic (congestive) heart failure: Secondary | ICD-10-CM | POA: Diagnosis not present

## 2017-07-14 DIAGNOSIS — R2681 Unsteadiness on feet: Secondary | ICD-10-CM | POA: Diagnosis not present

## 2017-07-14 DIAGNOSIS — Z9181 History of falling: Secondary | ICD-10-CM | POA: Diagnosis not present

## 2017-07-14 DIAGNOSIS — I5030 Unspecified diastolic (congestive) heart failure: Secondary | ICD-10-CM | POA: Diagnosis not present

## 2017-07-14 DIAGNOSIS — N183 Chronic kidney disease, stage 3 (moderate): Secondary | ICD-10-CM | POA: Diagnosis not present

## 2017-07-14 DIAGNOSIS — H3530 Unspecified macular degeneration: Secondary | ICD-10-CM | POA: Diagnosis not present

## 2017-07-14 DIAGNOSIS — M1990 Unspecified osteoarthritis, unspecified site: Secondary | ICD-10-CM | POA: Diagnosis not present

## 2017-07-14 DIAGNOSIS — I130 Hypertensive heart and chronic kidney disease with heart failure and stage 1 through stage 4 chronic kidney disease, or unspecified chronic kidney disease: Secondary | ICD-10-CM | POA: Diagnosis not present

## 2017-07-15 ENCOUNTER — Other Ambulatory Visit: Payer: Self-pay | Admitting: *Deleted

## 2017-07-15 MED ORDER — LOVASTATIN 40 MG PO TABS: 40.0000 mg | ORAL_TABLET | Freq: Every day | ORAL | 3 refills | Status: DC

## 2017-08-10 ENCOUNTER — Telehealth: Payer: Self-pay | Admitting: Family Medicine

## 2017-08-10 NOTE — Telephone Encounter (Signed)
Copied from [place] [id number]. Topic: Quick Communication - See Telephone Encounter >> Aug 10, 2017 11:47 AM Ether Griffins B wrote: CRM for notification. See Telephone encounter for:  Pt was switched to irbesartan pharmacy changed the mg to 75 due to it being hard to fill the other mg and he has 3 days left and they contacted the pharmacy for a refill and they are out and it has been back ordered. They are wanting to know what he needs to take since they cant get this rx filled. Amber (care giver) cell is [phone]. Pt's wife and her are going to run some errands if no one answers house phone may call her cell  08/10/17.

## 2017-08-11 NOTE — Telephone Encounter (Signed)
Roger [name] call pharmacy- do they have 150mg  that can be cut in half? Do other pharmacy's have this? Prefer not to switch him unless we have to

## 2017-08-12 ENCOUNTER — Other Ambulatory Visit: Payer: Self-pay

## 2017-08-12 ENCOUNTER — Telehealth: Payer: Self-pay | Admitting: Family Medicine

## 2017-08-12 MED ORDER — OXYBUTYNIN CHLORIDE 5 MG PO TABS: 2.5000 mg | ORAL_TABLET | Freq: Every day | ORAL | 3 refills | Status: DC

## 2017-08-12 NOTE — Telephone Encounter (Signed)
Spoke with pharmacist who stated that daughter told pharmacist that they had an older prescription for losartan and she wanted that filled. All dosages of irbesartan are back ordered and it will be after the New Year before they expect to receive any. Please advise

## 2017-08-12 NOTE — Telephone Encounter (Signed)
May go back to losartan. With chronic throat clearing- he was switched from losartan incase contributing- going back to irbesartan as soon as able is ideal

## 2017-08-12 NOTE — Telephone Encounter (Signed)
MEDICATION: oxybutynin (DITROPAN) 5 MG tablet  PHARMACY:   [id number] [place], [place] (Phone) [phone] (Fax)     IS THIS A 90 DAY SUPPLY : y  IS PATIENT OUT OF MEDICATION:   IF NOT; HOW MUCH IS LEFT:   LAST APPOINTMENT DATE: @10 /29/18 NEXT APPOINTMENT DATE:@Visit  date not found  OTHER COMMENTS:    **Let patient know to contact pharmacy at the end of the day to make sure medication is ready. **  ** Please notify patient to allow 48-72 hours to process**  **Encourage patient to contact the pharmacy for refills or they can request refills through [facility]**

## 2017-08-12 NOTE — Telephone Encounter (Signed)
Refill sent to pharmacy.   

## 2017-08-20 NOTE — Telephone Encounter (Signed)
Called the daughter [name] and left a voicemail message asking for a return phone call to update her with the plan

## 2017-08-29 ENCOUNTER — Other Ambulatory Visit: Payer: Self-pay

## 2017-08-29 ENCOUNTER — Emergency Department (HOSPITAL_COMMUNITY): Payer: Medicare Other

## 2017-08-29 ENCOUNTER — Inpatient Hospital Stay (HOSPITAL_COMMUNITY)
Admission: EM | Admit: 2017-08-29 | Discharge: 2017-09-01 | DRG: 202 | Disposition: A | Discharge status: Home Health | Payer: Medicare Other | Attending: Internal Medicine | Admitting: Internal Medicine

## 2017-08-29 ENCOUNTER — Encounter (HOSPITAL_COMMUNITY): Payer: Self-pay | Admitting: Emergency Medicine

## 2017-08-29 DIAGNOSIS — J209 Acute bronchitis, unspecified: Principal | ICD-10-CM

## 2017-08-29 DIAGNOSIS — D649 Anemia, unspecified: Secondary | ICD-10-CM | POA: Diagnosis not present

## 2017-08-29 DIAGNOSIS — M1990 Unspecified osteoarthritis, unspecified site: Secondary | ICD-10-CM | POA: Diagnosis not present

## 2017-08-29 DIAGNOSIS — H9190 Unspecified hearing loss, unspecified ear: Secondary | ICD-10-CM | POA: Diagnosis not present

## 2017-08-29 DIAGNOSIS — I10 Essential (primary) hypertension: Secondary | ICD-10-CM | POA: Diagnosis not present

## 2017-08-29 DIAGNOSIS — E785 Hyperlipidemia, unspecified: Secondary | ICD-10-CM | POA: Diagnosis present

## 2017-08-29 DIAGNOSIS — F329 Major depressive disorder, single episode, unspecified: Secondary | ICD-10-CM | POA: Diagnosis present

## 2017-08-29 DIAGNOSIS — E860 Dehydration: Secondary | ICD-10-CM | POA: Diagnosis not present

## 2017-08-29 DIAGNOSIS — Z87891 Personal history of nicotine dependence: Secondary | ICD-10-CM

## 2017-08-29 DIAGNOSIS — J690 Pneumonitis due to inhalation of food and vomit: Secondary | ICD-10-CM | POA: Diagnosis present

## 2017-08-29 DIAGNOSIS — Z886 Allergy status to analgesic agent status: Secondary | ICD-10-CM

## 2017-08-29 DIAGNOSIS — Z888 Allergy status to other drugs, medicaments and biological substances status: Secondary | ICD-10-CM | POA: Diagnosis not present

## 2017-08-29 DIAGNOSIS — N179 Acute kidney failure, unspecified: Secondary | ICD-10-CM | POA: Diagnosis not present

## 2017-08-29 DIAGNOSIS — I130 Hypertensive heart and chronic kidney disease with heart failure and stage 1 through stage 4 chronic kidney disease, or unspecified chronic kidney disease: Secondary | ICD-10-CM | POA: Diagnosis present

## 2017-08-29 DIAGNOSIS — J9601 Acute respiratory failure with hypoxia: Secondary | ICD-10-CM | POA: Diagnosis present

## 2017-08-29 DIAGNOSIS — N183 Chronic kidney disease, stage 3 unspecified: Secondary | ICD-10-CM | POA: Diagnosis present

## 2017-08-29 DIAGNOSIS — K219 Gastro-esophageal reflux disease without esophagitis: Secondary | ICD-10-CM | POA: Diagnosis not present

## 2017-08-29 DIAGNOSIS — I5030 Unspecified diastolic (congestive) heart failure: Secondary | ICD-10-CM | POA: Diagnosis not present

## 2017-08-29 DIAGNOSIS — J40 Bronchitis, not specified as acute or chronic: Secondary | ICD-10-CM | POA: Diagnosis present

## 2017-08-29 DIAGNOSIS — I5032 Chronic diastolic (congestive) heart failure: Secondary | ICD-10-CM | POA: Diagnosis present

## 2017-08-29 DIAGNOSIS — Z885 Allergy status to narcotic agent status: Secondary | ICD-10-CM | POA: Diagnosis not present

## 2017-08-29 DIAGNOSIS — J80 Acute respiratory distress syndrome: Secondary | ICD-10-CM | POA: Diagnosis not present

## 2017-08-29 DIAGNOSIS — H3530 Unspecified macular degeneration: Secondary | ICD-10-CM | POA: Diagnosis present

## 2017-08-29 DIAGNOSIS — R05 Cough: Secondary | ICD-10-CM | POA: Diagnosis not present

## 2017-08-29 DIAGNOSIS — F325 Major depressive disorder, single episode, in full remission: Secondary | ICD-10-CM | POA: Diagnosis present

## 2017-08-29 DIAGNOSIS — I5189 Other ill-defined heart diseases: Secondary | ICD-10-CM | POA: Diagnosis present

## 2017-08-29 LAB — BASIC METABOLIC PANEL
Anion gap: 9 (ref 5–15)
BUN: 35 mg/dL — AB (ref 6–20)
CHLORIDE: 107 mmol/L (ref 101–111)
CO2: 24 mmol/L (ref 22–32)
CREATININE: 1.67 mg/dL — AB (ref 0.61–1.24)
Calcium: 8.8 mg/dL — ABNORMAL LOW (ref 8.9–10.3)
GFR calc Af Amer: 38 mL/min — ABNORMAL LOW (ref >60)
GFR calc non Af Amer: 33 mL/min — ABNORMAL LOW (ref >60)
GLUCOSE: 93 mg/dL (ref 65–99)
Potassium: 4.1 mmol/L (ref 3.5–5.1)
Sodium: 140 mmol/L (ref 135–145)

## 2017-08-29 LAB — CBC WITH DIFFERENTIAL/PLATELET
Basophils Absolute: 0 10*3/uL (ref 0.0–0.1)
Basophils Relative: 0 %
Eosinophils Absolute: 0 10*3/uL (ref 0.0–0.7)
Eosinophils Relative: 0 %
HEMATOCRIT: 32.4 % — AB (ref 39.0–52.0)
HEMOGLOBIN: 10.4 g/dL — AB (ref 13.0–17.0)
LYMPHS ABS: 0.4 10*3/uL — AB (ref 0.7–4.0)
LYMPHS PCT: 5 %
MCH: 31.4 pg (ref 26.0–34.0)
MCHC: 32.1 g/dL (ref 30.0–36.0)
MCV: 97.9 fL (ref 78.0–100.0)
MONOS PCT: 2 %
Monocytes Absolute: 0.2 10*3/uL (ref 0.1–1.0)
NEUTROS ABS: 6.8 10*3/uL (ref 1.7–7.7)
NEUTROS PCT: 93 %
Platelets: 179 10*3/uL (ref 150–400)
RBC: 3.31 MIL/uL — ABNORMAL LOW (ref 4.22–5.81)
RDW: 13.6 % (ref 11.5–15.5)
WBC: 7.4 10*3/uL (ref 4.0–10.5)

## 2017-08-29 LAB — INFLUENZA PANEL BY PCR (TYPE A & B)
Influenza A By PCR: NEGATIVE
Influenza B By PCR: NEGATIVE

## 2017-08-29 LAB — BRAIN NATRIURETIC PEPTIDE: B Natriuretic Peptide: 152.7 pg/mL — ABNORMAL HIGH (ref 0.0–100.0)

## 2017-08-29 MED ORDER — ACETAMINOPHEN 650 MG RE SUPP: 650.0000 mg | Freq: Four times a day (QID) | RECTAL | Status: DC | PRN

## 2017-08-29 MED ORDER — ACETAMINOPHEN 325 MG PO TABS: 650.0000 mg | ORAL_TABLET | Freq: Four times a day (QID) | ORAL | Status: DC | PRN

## 2017-08-29 MED ORDER — FLUTICASONE PROPIONATE 50 MCG/ACT NA SUSP
2.0000 | Freq: Every day | NASAL | Status: DC
  Administered 2017-08-30 – 2017-09-01 (×3): 2 via NASAL
  Filled 2017-08-29: qty 16

## 2017-08-29 MED ORDER — PANTOPRAZOLE SODIUM 40 MG PO TBEC
40.0000 mg | DELAYED_RELEASE_TABLET | Freq: Every day | ORAL | Status: DC
  Administered 2017-08-30 – 2017-09-01 (×3): 40 mg via ORAL
  Filled 2017-08-29 (×3): qty 1

## 2017-08-29 MED ORDER — GUAIFENESIN ER 600 MG PO TB12
1200.0000 mg | ORAL_TABLET | Freq: Two times a day (BID) | ORAL | Status: DC
  Administered 2017-08-29 – 2017-09-01 (×6): 1200 mg via ORAL
  Filled 2017-08-29 (×6): qty 2

## 2017-08-29 MED ORDER — ALBUTEROL SULFATE (2.5 MG/3ML) 0.083% IN NEBU
2.5000 mg | INHALATION_SOLUTION | Freq: Once | RESPIRATORY_TRACT | Status: AC
  Administered 2017-08-29: 2.5 mg via RESPIRATORY_TRACT
  Filled 2017-08-29: qty 3

## 2017-08-29 MED ORDER — DEXTROSE 5 % IV SOLN
500.0000 mg | INTRAVENOUS | Status: DC
  Administered 2017-08-29: 500 mg via INTRAVENOUS
  Filled 2017-08-29 (×2): qty 500

## 2017-08-29 MED ORDER — SODIUM CHLORIDE 0.9 % IV SOLN
INTRAVENOUS | Status: DC
  Administered 2017-08-29 – 2017-08-31 (×3): via INTRAVENOUS

## 2017-08-29 MED ORDER — ENOXAPARIN SODIUM 30 MG/0.3ML ~~LOC~~ SOLN
30.0000 mg | SUBCUTANEOUS | Status: DC
  Administered 2017-08-29 – 2017-08-31 (×3): 30 mg via SUBCUTANEOUS
  Filled 2017-08-29 (×3): qty 0.3

## 2017-08-29 MED ORDER — GUAIFENESIN-DM 100-10 MG/5ML PO SYRP
10.0000 mL | ORAL_SOLUTION | ORAL | Status: DC | PRN
  Filled 2017-08-29 (×2): qty 10

## 2017-08-29 MED ORDER — PRAVASTATIN SODIUM 20 MG PO TABS
20.0000 mg | ORAL_TABLET | Freq: Every day | ORAL | Status: DC
  Administered 2017-08-31 – 2017-09-01 (×2): 20 mg via ORAL
  Filled 2017-08-29 (×3): qty 1

## 2017-08-29 MED ORDER — OXYBUTYNIN CHLORIDE 5 MG PO TABS
2.5000 mg | ORAL_TABLET | Freq: Every day | ORAL | Status: DC
  Administered 2017-08-30 – 2017-09-01 (×3): 2.5 mg via ORAL
  Filled 2017-08-29 (×3): qty 1

## 2017-08-29 MED ORDER — METHYLPREDNISOLONE SODIUM SUCC 125 MG IJ SOLR
125.0000 mg | Freq: Once | INTRAMUSCULAR | Status: AC
  Administered 2017-08-29: 125 mg via INTRAVENOUS
  Filled 2017-08-29: qty 2

## 2017-08-29 MED ORDER — ASPIRIN EC 325 MG PO TBEC
325.0000 mg | DELAYED_RELEASE_TABLET | Freq: Every day | ORAL | Status: DC
  Administered 2017-08-30 – 2017-09-01 (×3): 325 mg via ORAL
  Filled 2017-08-29 (×3): qty 1

## 2017-08-29 MED ORDER — PREDNISONE 20 MG PO TABS
60.0000 mg | ORAL_TABLET | Freq: Every day | ORAL | Status: AC
  Administered 2017-08-30 – 2017-09-01 (×3): 60 mg via ORAL
  Filled 2017-08-29 (×3): qty 3

## 2017-08-29 MED ORDER — IPRATROPIUM-ALBUTEROL 0.5-2.5 (3) MG/3ML IN SOLN
3.0000 mL | Freq: Once | RESPIRATORY_TRACT | Status: AC
  Administered 2017-08-29: 3 mL via RESPIRATORY_TRACT
  Filled 2017-08-29: qty 3

## 2017-08-29 MED ORDER — IPRATROPIUM-ALBUTEROL 0.5-2.5 (3) MG/3ML IN SOLN
3.0000 mL | Freq: Four times a day (QID) | RESPIRATORY_TRACT | Status: DC
  Administered 2017-08-29 – 2017-08-30 (×5): 3 mL via RESPIRATORY_TRACT
  Filled 2017-08-29 (×5): qty 3

## 2017-08-29 MED ORDER — ONDANSETRON HCL 4 MG/2ML IJ SOLN
4.0000 mg | Freq: Four times a day (QID) | INTRAMUSCULAR | Status: DC | PRN
Start: 2017-08-29 — End: 2017-09-02

## 2017-08-29 MED ORDER — ONDANSETRON HCL 4 MG PO TABS: 4.0000 mg | ORAL_TABLET | Freq: Four times a day (QID) | ORAL | Status: DC | PRN

## 2017-08-29 NOTE — ED Triage Notes (Signed)
Pt comes from home with complaints of pain upon respiration.  Saturating at 88% RA on EMS arrival.  Was placed on 4L with some relief.  Denies shortness of breath. BP 122/90, HR 94. A&O x4. Hard of hearing. Wife has recently been diagnosed with bronchitis.

## 2017-08-29 NOTE — H&P (Signed)
History and Physical    Roger [name] EXB:[id number] DOB: Dec 05, 1919 DOA: 08/29/2017  PCP: [name], MD  Patient coming from: [facility]   I have personally briefly reviewed patient's old medical records in [place]  Chief Complaint: cough, sob since yesterday.   HPI: Roger [name] is a 84 y.o. male with medical history significant of  Hypertension, hyperlipidemia, osteoarthritis, GERD, chronic diastolic heart failure, depression, stage 3 CKD. MACULAR degeneration, brought in by his daughter for worsening sob, cough, congestion, since yesterday. On arrival to ED, he was diffusely wheezing, borderline sats on RA requiring 2 to 3 lit of [place] oxygen to keep sats greater than 90%. , . Labs reveal creatinine of 1.67, which is slightly worse than baseline and hemoglobin of 10.4, wbc count of 7.4.  CXR did not show any pneumonia.  influenza pcr is negative. He was referred to medical service for admission for bronchospasm, acute bronchitis.  Pt is a poor historian, and is very hard of hearing,. Some history available from the patient, most of the history obtained from the daughter at bedside.  Pt denies any chest pain right now. No nausea, vomiting or diarrhea or abdominal pain. No urinary symptoms. He denies any headache.  At baseline pt is most in the wheel chair but is able to do his ADL'S.   Review of Systems: SEE HPI for detailed ROS.   Past Medical History:  Diagnosis Date  . GERD 02/22/2007  . HYPERLIPIDEMIA 02/22/2007  . HYPERTENSION 06/17/2007  . MACULAR DEGENERATION 02/22/2007  . OSTEOARTHRITIS, HIP, RIGHT 06/05/2009    Past Surgical History:  Procedure Laterality Date  . LAMINECTOMY  1988     reports that he has quit smoking. he has never used smokeless tobacco. He reports that he does not drink alcohol or use drugs.  Allergies  Allergen Reactions  . Amlodipine Besylate     REACTION: swelling of feet  . Aspirin     REACTION: GI upset can tolerate ecotrin  .  Hydrocodone     GI upset    Family History  Problem Relation Age of Onset  . Breast cancer Daughter      Family history reviewed and not pertinent   Prior to Admission medications   Medication Sig Start Date End Date Taking? Authorizing Provider  aspirin (ECOTRIN) 325 MG EC tablet Take 325 mg by mouth daily.   Yes [provider]  fluticasone (FLONASE) 50 MCG/ACT nasal spray Place 2 sprays into both nostrils daily. 03/11/15  Yes Short, Noah Delaine, MD  furosemide (LASIX) 20 MG tablet Take 1 tablet (20 mg total) daily by mouth. 07/01/17  Yes [name], MD  guaiFENesin (MUCINEX) 600 MG 12 hr tablet Take 2 tablets (1,200 mg total) by mouth 2 (two) times daily. 03/11/15  Yes Short, Noah Delaine, MD  losartan (COZAAR) 100 MG tablet Take 100 mg by mouth daily. 08/12/17  Yes [provider]  lovastatin (MEVACOR) 40 MG tablet Take 1 tablet (40 mg total) by mouth daily. 07/15/17  Yes [name], MD  Multiple Vitamins-Minerals (MULTIVITAMIN WITH MINERALS) tablet Take 1 tablet by mouth daily.   Yes [provider]  omeprazole (PRILOSEC) 20 MG capsule TAKE ONE CAPSULE BY MOUTH TWICE A DAY BEFORE A MEAL 02/24/17  Yes [name], MD  oxybutynin (DITROPAN) 5 MG tablet Take 0.5 tablets (2.5 mg total) by mouth daily. 08/12/17  Yes [name], MD  UNABLE TO FIND Med Name: Med pass 120 mL by mouth  twice daily for supplement   Yes [provider]    Physical Exam: Vitals:   08/29/17 1500 08/29/17 1611 08/29/17 1630 08/29/17 1801  BP:  115/73 (!) 133/105 114/73  Pulse: 97 96 95 88  Resp: (!) 23 (!) 25 (!) 24 20  Temp:    98.4 F (36.9 C)  TempSrc:    Oral  SpO2: 100% 96% 99% 91%  Weight:    73.1 kg (161 lb 2.5 oz)  Height:    5\' 9"  (1.753 m)    Constitutional: NAD, calm, comfortable Vitals:   08/29/17 1500 08/29/17 1611 08/29/17 1630 08/29/17 1801  BP:  115/73 (!) 133/105 114/73  Pulse: 97 96 95 88  Resp: (!) 23 (!) 25 (!) 24 20  Temp:     98.4 F (36.9 C)  TempSrc:    Oral  SpO2: 100% 96% 99% 91%  Weight:    73.1 kg (161 lb 2.5 oz)  Height:    5\' 9"  (1.753 m)   Eyes: PERRL, lids and conjunctivae normal ENMT: Mucous membranes are moist. Posterior pharynx clear of any exudate or lesions.Normal dentition.  Neck: normal, supple, no masses, no thyromegaly Respiratory: BILATERAL SCATTERED WHEEZING. Air entry fair.   Cardiovascular: Regular rate and rhythm, no murmurs / rubs / gallops. 2+ pedal edema.  2+ pedal pulses. No carotid bruits.  Abdomen: no tenderness, no masses palpated. No hepatosplenomegaly. Bowel sounds positive.  Musculoskeletal: 2+ pedal edema.  Skin: no rashes, lesions, ulcers. No induration Neurologic: alert , oriented to person and place.  Psychiatric: normal mood.   )  Labs on Admission: I have personally reviewed following labs and imaging studies  CBC: No results for input(s): WBC, NEUTROABS, HGB, HCT, MCV, PLT in the last 168 hours. Basic Metabolic Panel: Recent Labs  Lab 08/29/17 1405  NA 140  K 4.1  CL 107  CO2 24  GLUCOSE 93  BUN 35*  CREATININE 1.67*  CALCIUM 8.8*   GFR: Estimated Creatinine Clearance: 26.5 mL/min (A) (by C-G formula based on SCr of 1.67 mg/dL (H)). Liver Function Tests: No results for input(s): AST, ALT, ALKPHOS, BILITOT, PROT, ALBUMIN in the last 168 hours. No results for input(s): LIPASE, AMYLASE in the last 168 hours. No results for input(s): AMMONIA in the last 168 hours. Coagulation Profile: No results for input(s): INR, PROTIME in the last 168 hours. Cardiac Enzymes: No results for input(s): CKTOTAL, CKMB, CKMBINDEX, TROPONINI in the last 168 hours. BNP (last 3 results) No results for input(s): PROBNP in the last 8760 hours. HbA1C: No results for input(s): HGBA1C in the last 72 hours. CBG: No results for input(s): GLUCAP in the last 168 hours. Lipid Profile: No results for input(s): CHOL, HDL, LDLCALC, TRIG, CHOLHDL, LDLDIRECT in the last 72  hours. Thyroid Function Tests: No results for input(s): TSH, T4TOTAL, FREET4, T3FREE, THYROIDAB in the last 72 hours. Anemia Panel: No results for input(s): VITAMINB12, FOLATE, FERRITIN, TIBC, IRON, RETICCTPCT in the last 72 hours. Urine analysis:    Component Value Date/Time   COLORURINE YELLOW 03/08/2015 1710   APPEARANCEUR CLOUDY (A) 03/08/2015 1710   LABSPEC 1.023 03/08/2015 1710   PHURINE 5.5 03/08/2015 1710   GLUCOSEU NEGATIVE 03/08/2015 1710   HGBUR NEGATIVE 03/08/2015 1710   BILIRUBINUR NEGATIVE 03/08/2015 1710   BILIRUBINUR neg 12/07/2013 1557   KETONESUR NEGATIVE 03/08/2015 1710   PROTEINUR 30 (A) 03/08/2015 1710   UROBILINOGEN 0.2 03/08/2015 1710   NITRITE NEGATIVE 03/08/2015 1710   LEUKOCYTESUR NEGATIVE 03/08/2015 1710    Radiological  Exams on Admission: Dg Chest 2 View  Result Date: 08/29/2017 CLINICAL DATA:  Cough and weakness for 2 days. EXAM: CHEST  2 VIEW COMPARISON:  03/09/2015 and prior radiographs FINDINGS: Upper limits normal heart size noted. This is a low volume film with bibasilar atelectasis. No definite airspace disease, pleural effusion or pneumothorax. An L1 compression fracture is unchanged. IMPRESSION: Low volume film with bibasilar atelectasis. Electronically Signed   By: Margarette Canada M.D.   On: 08/29/2017 15:42     Assessment/Plan Active Problems:   Bronchitis acute respiratory failure with hypoxia from bronchospasm and bronchitis: - [place] oxygen to keep sats greater than 90%.  - duonebs scheduled for the first 24 hours, along with solumedrol one dose given in ED,  - quick taper of steroids.  - azithromycin for bronchitis.  - influenza pcr is negative.     Mild dehydration:  Gently hydrate .   Acute KI on STAGE 3 CKD.  - gently hydrate and repeat renal parameters in am.    Hypertension: well controlled.   GERD: resume PPI.   Mild normocytic anemia:  Hemoglobin around 10 on admission which is at baseline.   Chronic diastolic heart  failure:  He appears to be compensated.     Hyperlipidemia:  Resume statin.    DVT prophylaxis:  lovenox.  Code Status:  Full code.  Family Communication: daughter at bedside.  Disposition Plan: possible d/c in am.  Consults called: none.  Admission status: obs/tele   Hosie Poisson MD [facility] Pager [phone]  If 7PM-7AM, please contact night-coverage [email or web address] Password [facility]  08/29/2017, 6:08 PM

## 2017-08-29 NOTE — ED Notes (Signed)
Bed: WA07 Expected date:  Expected time:  Means of arrival:  Comments: Sats 88% [facility]

## 2017-08-29 NOTE — ED Provider Notes (Signed)
[facility] DEPT Provider Note   CSN: [id number] Arrival date & time: 08/29/17  1331   History   Chief Complaint Chief Complaint  Patient presents with  . Possible Bronchitis  . Pleurisy    HPI Roger [name] is a 84 y.o. male.  HPI The patient presents to the emergency room for evaluation of cough and congestion and weakness.  Patient states he started getting what he thought was a respiratory infection a few days ago.  The symptoms have progressed.  He is constantly coughing.  Soon as he tries to speak he starts having fits of cough.  He denies feeling short of breath.  He denies any fevers.  No vomiting or diarrhea.  No chest pain.  His wife was recently ill and diagnosed with bronchitis.  EMS brought him into the emergency room and they noted that his oxygen saturation was 88% on room air.  Patient states he does not have any history of lung disease.  He is not normal in any medications for lung problems. Past Medical History:  Diagnosis Date  . GERD 02/22/2007  . HYPERLIPIDEMIA 02/22/2007  . HYPERTENSION 06/17/2007  . MACULAR DEGENERATION 02/22/2007  . OSTEOARTHRITIS, HIP, RIGHT 06/05/2009    Patient Active Problem List   Diagnosis Date Noted  . Allergic conjunctivitis of both eyes 06/22/2017  . Idiopathic chronic venous hypertension of left lower extremity with ulcer and inflammation ([facility]) 10/22/2016  . Onychomycosis 10/22/2016  . Skin ulcer of left ankle, limited to breakdown of skin ([facility]) 10/16/2016  . Diastolic CHF ([facility]) 63/78/5885  . Depression 07/05/2015  . History of pneumonia 03/10/2015  . Venous (peripheral) insufficiency 01/26/2015  . Chronic throat clearing 10/11/2014  . CKD (chronic kidney disease), stage III ([place]) 07/13/2014  . Overactive bladder 07/13/2014  . Syncope 11/17/2013  . Macular degeneration 06/19/2011  . Osteoarthritis 06/05/2009  . CARCINOMA, SKIN, SQUAMOUS CELL 05/26/2008  . Hypertension 06/17/2007  .  Hyperlipidemia 02/22/2007  . GERD (gastroesophageal reflux disease) 02/22/2007    Past Surgical History:  Procedure Laterality Date  . LAMINECTOMY  1988       Home Medications    Prior to Admission medications   Medication Sig Start Date End Date Taking? Authorizing Provider  aspirin (ECOTRIN) 325 MG EC tablet Take 325 mg by mouth daily.    [provider]  azelastine (OPTIVAR) 0.05 % ophthalmic solution Place 1 drop into both eyes 2 (two) times daily. 06/22/17   [name], MD  fluticasone (FLONASE) 50 MCG/ACT nasal spray Place 2 sprays into both nostrils daily. 03/11/15   Janece Canterbury, MD  furosemide (LASIX) 20 MG tablet Take 1 tablet (20 mg total) daily by mouth. 07/01/17   [name], MD  guaiFENesin (MUCINEX) 600 MG 12 hr tablet Take 2 tablets (1,200 mg total) by mouth 2 (two) times daily. 03/11/15   Janece Canterbury, MD  irbesartan (AVAPRO) 75 MG tablet Take 1 tablet (75 mg total) by mouth daily. 06/22/17   [name], MD  lovastatin (MEVACOR) 40 MG tablet Take 1 tablet (40 mg total) by mouth daily. 07/15/17   [name], MD  Multiple Vitamins-Minerals (MULTIVITAMIN WITH MINERALS) tablet Take 1 tablet by mouth daily.    [provider]  omeprazole (PRILOSEC) 20 MG capsule TAKE ONE CAPSULE BY MOUTH TWICE A DAY BEFORE A MEAL 02/24/17   [name], MD  oxybutynin (DITROPAN) 5 MG tablet Take 0.5 tablets (2.5 mg total) by mouth daily. 08/12/17  [name], MD  sertraline (ZOLOFT) 25 MG tablet Take 1 tablet (25 mg total) daily by mouth. 07/06/17   [name], MD  UNABLE TO FIND Med Name: Med pass 120 mL by mouth twice daily for supplement    [provider]    Family History Family History  Problem Relation Age of Onset  . Breast cancer Daughter     Social History Social History   Tobacco Use  . Smoking status: Former Research scientist (life sciences)  . Smokeless tobacco: Never Used  Substance Use Topics  . Alcohol use: No    . Drug use: No     Allergies   Amlodipine besylate; Aspirin; and Hydrocodone   Review of Systems Review of Systems   Physical Exam Updated Vital Signs BP 115/73 (BP Location: Right Arm)   Pulse 96   Temp 99.1 F (37.3 C) (Oral)   Resp (!) 25   SpO2 96%   Physical Exam  Constitutional: No distress.  Elderly, frail  HENT:  Head: Normocephalic and atraumatic.  Right Ear: External ear normal.  Left Ear: External ear normal.  Eyes: Conjunctivae are normal. Right eye exhibits no discharge. Left eye exhibits no discharge. No scleral icterus.  Neck: Neck supple. No tracheal deviation present.  Cardiovascular: Normal rate, regular rhythm and intact distal pulses.  Pulmonary/Chest: Accessory muscle usage present. No stridor. Tachypnea noted. No respiratory distress. He has wheezes. He has no rales.  Abdominal: Soft. Bowel sounds are normal. He exhibits no distension. There is no tenderness. There is no rebound and no guarding.  Musculoskeletal: He exhibits no edema or tenderness.  Neurological: He is alert. He has normal strength. No cranial nerve deficit (no facial droop, extraocular movements intact, no slurred speech) or sensory deficit. He exhibits normal muscle tone. He displays no seizure activity. Coordination normal.  Skin: Skin is warm and dry. No rash noted.  Psychiatric: He has a normal mood and affect.  Nursing note and vitals reviewed.    ED Treatments / Results  Labs (all labs ordered are listed, but only abnormal results are displayed) Labs Reviewed  BASIC METABOLIC PANEL - Abnormal; Notable for the following components:      Result Value   BUN 35 (*)    Creatinine, Ser 1.67 (*)    Calcium 8.8 (*)    GFR calc non Af Amer 33 (*)    GFR calc Af Amer 38 (*)    All other components within normal limits  BRAIN NATRIURETIC PEPTIDE - Abnormal; Notable for the following components:   B Natriuretic Peptide 152.7 (*)    All other components within normal limits   INFLUENZA PANEL BY PCR (TYPE A & B)  CBC WITH DIFFERENTIAL/PLATELET  CBC WITH DIFFERENTIAL/PLATELET     Radiology Dg Chest 2 View  Result Date: 08/29/2017 CLINICAL DATA:  Cough and weakness for 2 days. EXAM: CHEST  2 VIEW COMPARISON:  03/09/2015 and prior radiographs FINDINGS: Upper limits normal heart size noted. This is a low volume film with bibasilar atelectasis. No definite airspace disease, pleural effusion or pneumothorax. An L1 compression fracture is unchanged. IMPRESSION: Low volume film with bibasilar atelectasis. Electronically Signed   By: Margarette Canada M.D.   On: 08/29/2017 15:42    Procedures Procedures (including critical care time)  Medications Ordered in ED Medications  0.9 %  sodium chloride infusion (not administered)  methylPREDNISolone sodium succinate (SOLU-MEDROL) 125 mg/2 mL injection 125 mg (not administered)  ipratropium-albuterol (DUONEB) 0.5-2.5 (3) MG/3ML nebulizer solution 3 mL (  3 mLs Nebulization Given 08/29/17 1419)     Initial Impression / Assessment and Plan / ED Course  I have reviewed the triage vital signs and the nursing notes.  Pertinent labs & imaging results that were available during my care of the patient were reviewed by me and considered in my medical decision making (see chart for details).  Clinical Course as of Aug 29 1614  Sat Aug 29, 2017  1606 Discussed findings with daughter and patient.    [JK]    Clinical Course User Index [JK] Dorie Rank, MD    Patient presented to the emergency room for evaluation of cough and shortness of breath.  Patient denies any history of lung disease but he does have history of allergies.  His wife was recently diagnosed with a viral illness.  The patient was feeling very weak he was coughing constantly.  On my exam he did have wheezing.  No heart failure.  No pna on xray.   I suspect his symptoms are related to a bronchitis associated with bronchospasm.  He does have a new oxygen requirement.   Considering his age and comorbidities I will consult with medical service for admission  Final Clinical Impressions(s) / ED Diagnoses   Final diagnoses:  Bronchitis with bronchospasm      Dorie Rank, MD 08/29/17 1616

## 2017-08-30 DIAGNOSIS — I5030 Unspecified diastolic (congestive) heart failure: Secondary | ICD-10-CM

## 2017-08-30 DIAGNOSIS — N183 Chronic kidney disease, stage 3 (moderate): Secondary | ICD-10-CM

## 2017-08-30 DIAGNOSIS — I10 Essential (primary) hypertension: Secondary | ICD-10-CM

## 2017-08-30 MED ORDER — AZITHROMYCIN 250 MG PO TABS
500.0000 mg | ORAL_TABLET | Freq: Every day | ORAL | Status: DC
  Administered 2017-08-30 – 2017-08-31 (×2): 500 mg via ORAL
  Filled 2017-08-30 (×2): qty 2

## 2017-08-30 NOTE — Progress Notes (Signed)
PHARMACIST - PHYSICIAN COMMUNICATION DR:  Karleen Hampshire CONCERNING: Antibiotic IV to Oral Route Change Policy  RECOMMENDATION: This patient is receiving azithromycin by the intravenous route.  Based on criteria approved by the Pharmacy and Therapeutics Committee, the antibiotic(s) is/are being converted to the equivalent oral dose form(s).   DESCRIPTION: These criteria include:  Patient being treated for a respiratory tract infection, urinary tract infection, cellulitis or clostridium difficile associated diarrhea if on metronidazole  The patient is not neutropenic and does not exhibit a GI malabsorption state  The patient is eating (either orally or via tube) and/or has been taking other orally administered medications for a least 24 hours  The patient is improving clinically and has a Tmax < 100.5  If you have questions about this conversion, please contact the Pharmacy Department  []   [phone] )  [facility] []   [phone] )  [facility] []   [phone] )  [facility] []   [phone] )  [facility]  [x]   [phone] )  [place], [place].D. [phone] 08/30/2017 1:22 PM

## 2017-08-30 NOTE — Progress Notes (Signed)
Spo2 92% 2lnc. Pt comfortable, RR 16. RA spo2 90%. Pt denies SOB of difficulty breathing but become fidgety.  Increased to 2lnc. 92% pt with no signs of SOB.

## 2017-08-30 NOTE — Progress Notes (Signed)
PROGRESS NOTE    Roger [name] [facility]  NID:[id number] DOB: [birth year] DOA: 08/29/2017 PCP: [name], MD   Brief Narrative: Roger [name] is a 84 y.o. male with medical history significant of  Hypertension, hyperlipidemia, osteoarthritis, GERD, chronic diastolic heart failure, depression, stage 3 CKD. MACULAR degeneration, brought in by his daughter for worsening sob, cough, congestion, since yesterday. On arrival to ED, he was diffusely wheezing, borderline sats on RA requiring 2 to 3 lit of [place] oxygen to keep sats greater than 90%. influenza pcr is negative. He was referred to medical service for admission for bronchospasm, acute bronchitis.       Assessment & Plan:   Active Problems:   Hyperlipidemia   Hypertension   GERD (gastroesophageal reflux disease)   Osteoarthritis   CKD (chronic kidney disease), stage III (HCC)   Depression   Diastolic CHF (HCC)   Bronchitis   Acute respiratory failure with hypoxia secondary to bronchitis and a component of bronchospasm  Admitted for steroids, duo nebs, azithromycin for bronchitis.  Influenza PCR is negative Nasal cannula oxygen to keep sats greater than 90%.  PT evaluation.   Acute kidney injury on stage III CKD Repeat labs are pending.  Suspect secondary to dehydration.   Chronic diastolic heart failure Compensated  Hypertension well-controlled Mild normocytic anemia hemoglobin around 10    Hyperlipidemia resume statin   DVT prophylaxis: Lovenox Code Status: Full code Family Communication: None at bedside today. Disposition Plan: Pending PT evaluation Consultants:  None  Procedures: None   Antimicrobials: Azithromycin 500 mg for 3 days   Subjective: Patient reports feeling better breathing is better and cough has lessened  Objective: Vitals:   08/30/17 0508 08/30/17 1015 08/30/17 1307 08/30/17 1359  BP: 112/64  (!) 141/96   Pulse: 84  88   Resp: 20  18   Temp: 97.7 F (36.5 C)  98.4 F (36.9 C)     TempSrc: Oral  Oral   SpO2: 93% 93% 96% 92%  Weight:      Height:        Intake/Output Summary (Last 24 hours) at 08/30/2017 1616 Last data filed at 08/30/2017 0946 Gross per 24 hour  Intake 1512.92 ml  Output 250 ml  Net 1262.92 ml   Filed Weights   08/29/17 1801  Weight: 73.1 kg (161 lb 2.5 oz)    Examination:  General exam: Appears calm and comfortable  Respiratory system: Clear to auscultation. Respiratory effort normal. Cardiovascular system: S1 & S2 heard, RRR. No JVD, murmurs, rubs, gallops or clicks. No pedal edema. Gastrointestinal system: Abdomen is nondistended, soft and nontender. No organomegaly or masses felt. Normal bowel sounds heard. Central nervous system: Alert and oriented. No focal neurological deficits. Extremities: Symmetric 5 x 5 power. Skin: No rashes, lesions or ulcers Psychiatry: Judgement and insight appear normal. Mood & affect appropriate.     Data Reviewed: I have personally reviewed following labs and imaging studies  CBC: Recent Labs  Lab 08/29/17 1915  WBC 7.4  NEUTROABS 6.8  HGB 10.4*  HCT 32.4*  MCV 97.9  PLT 144   Basic Metabolic Panel: Recent Labs  Lab 08/29/17 1405  NA 140  K 4.1  CL 107  CO2 24  GLUCOSE 93  BUN 35*  CREATININE 1.67*  CALCIUM 8.8*   GFR: Estimated Creatinine Clearance: 26.5 mL/min (A) (by C-G formula based on SCr of 1.67 mg/dL (H)). Liver Function Tests: No results for input(s): AST, ALT, ALKPHOS, BILITOT, PROT, ALBUMIN in the last 168  hours. No results for input(s): LIPASE, AMYLASE in the last 168 hours. No results for input(s): AMMONIA in the last 168 hours. Coagulation Profile: No results for input(s): INR, PROTIME in the last 168 hours. Cardiac Enzymes: No results for input(s): CKTOTAL, CKMB, CKMBINDEX, TROPONINI in the last 168 hours. BNP (last 3 results) No results for input(s): PROBNP in the last 8760 hours. HbA1C: No results for input(s): HGBA1C in the last 72 hours. CBG: No results  for input(s): GLUCAP in the last 168 hours. Lipid Profile: No results for input(s): CHOL, HDL, LDLCALC, TRIG, CHOLHDL, LDLDIRECT in the last 72 hours. Thyroid Function Tests: No results for input(s): TSH, T4TOTAL, FREET4, T3FREE, THYROIDAB in the last 72 hours. Anemia Panel: No results for input(s): VITAMINB12, FOLATE, FERRITIN, TIBC, IRON, RETICCTPCT in the last 72 hours. Sepsis Labs: No results for input(s): PROCALCITON, LATICACIDVEN in the last 168 hours.  No results found for this or any previous visit (from the past 240 hour(s)).       Radiology Studies: Dg Chest 2 View  Result Date: 08/29/2017 CLINICAL DATA:  Cough and weakness for 2 days. EXAM: CHEST  2 VIEW COMPARISON:  03/09/2015 and prior radiographs FINDINGS: Upper limits normal heart size noted. This is a low volume film with bibasilar atelectasis. No definite airspace disease, pleural effusion or pneumothorax. An L1 compression fracture is unchanged. IMPRESSION: Low volume film with bibasilar atelectasis. Electronically Signed   By: Margarette Canada M.D.   On: 08/29/2017 15:42        Scheduled Meds: . aspirin  325 mg Oral Daily  . azithromycin  500 mg Oral QHS  . enoxaparin (LOVENOX) injection  30 mg Subcutaneous Q24H  . fluticasone  2 spray Each Nare Daily  . guaiFENesin  1,200 mg Oral BID  . ipratropium-albuterol  3 mL Nebulization Q6H  . oxybutynin  2.5 mg Oral Daily  . pantoprazole  40 mg Oral Daily  . pravastatin  20 mg Oral q1800  . predniSONE  60 mg Oral Q breakfast   Continuous Infusions: . sodium chloride 75 mL/hr at 08/30/17 0552     LOS: 0 days    Time spent: 35 minutes    Hosie Poisson, MD [facility] Pager [phone]  If 7PM-7AM, please contact night-coverage [email or web address] Password [email or web address] 08/30/2017, 4:16 PM

## 2017-08-31 ENCOUNTER — Observation Stay (HOSPITAL_COMMUNITY): Payer: Medicare Other

## 2017-08-31 DIAGNOSIS — N183 Chronic kidney disease, stage 3 (moderate): Secondary | ICD-10-CM | POA: Diagnosis not present

## 2017-08-31 DIAGNOSIS — Z886 Allergy status to analgesic agent status: Secondary | ICD-10-CM | POA: Diagnosis not present

## 2017-08-31 DIAGNOSIS — I130 Hypertensive heart and chronic kidney disease with heart failure and stage 1 through stage 4 chronic kidney disease, or unspecified chronic kidney disease: Secondary | ICD-10-CM | POA: Diagnosis present

## 2017-08-31 DIAGNOSIS — I5030 Unspecified diastolic (congestive) heart failure: Secondary | ICD-10-CM | POA: Diagnosis not present

## 2017-08-31 DIAGNOSIS — Z87891 Personal history of nicotine dependence: Secondary | ICD-10-CM | POA: Diagnosis not present

## 2017-08-31 DIAGNOSIS — J209 Acute bronchitis, unspecified: Secondary | ICD-10-CM | POA: Diagnosis not present

## 2017-08-31 DIAGNOSIS — E785 Hyperlipidemia, unspecified: Secondary | ICD-10-CM | POA: Diagnosis not present

## 2017-08-31 DIAGNOSIS — J80 Acute respiratory distress syndrome: Secondary | ICD-10-CM | POA: Diagnosis not present

## 2017-08-31 DIAGNOSIS — J439 Emphysema, unspecified: Secondary | ICD-10-CM | POA: Diagnosis not present

## 2017-08-31 DIAGNOSIS — E860 Dehydration: Secondary | ICD-10-CM | POA: Diagnosis present

## 2017-08-31 DIAGNOSIS — K219 Gastro-esophageal reflux disease without esophagitis: Secondary | ICD-10-CM | POA: Diagnosis not present

## 2017-08-31 DIAGNOSIS — H3530 Unspecified macular degeneration: Secondary | ICD-10-CM | POA: Diagnosis present

## 2017-08-31 DIAGNOSIS — D649 Anemia, unspecified: Secondary | ICD-10-CM | POA: Diagnosis present

## 2017-08-31 DIAGNOSIS — H9190 Unspecified hearing loss, unspecified ear: Secondary | ICD-10-CM | POA: Diagnosis present

## 2017-08-31 DIAGNOSIS — I5032 Chronic diastolic (congestive) heart failure: Secondary | ICD-10-CM | POA: Diagnosis present

## 2017-08-31 DIAGNOSIS — N179 Acute kidney failure, unspecified: Secondary | ICD-10-CM | POA: Diagnosis present

## 2017-08-31 DIAGNOSIS — F329 Major depressive disorder, single episode, unspecified: Secondary | ICD-10-CM | POA: Diagnosis present

## 2017-08-31 DIAGNOSIS — Z885 Allergy status to narcotic agent status: Secondary | ICD-10-CM | POA: Diagnosis not present

## 2017-08-31 DIAGNOSIS — J690 Pneumonitis due to inhalation of food and vomit: Secondary | ICD-10-CM | POA: Diagnosis present

## 2017-08-31 DIAGNOSIS — M1990 Unspecified osteoarthritis, unspecified site: Secondary | ICD-10-CM | POA: Diagnosis present

## 2017-08-31 DIAGNOSIS — J9601 Acute respiratory failure with hypoxia: Secondary | ICD-10-CM | POA: Diagnosis present

## 2017-08-31 DIAGNOSIS — Z888 Allergy status to other drugs, medicaments and biological substances status: Secondary | ICD-10-CM | POA: Diagnosis not present

## 2017-08-31 LAB — CBC
HCT: 29.4 % — ABNORMAL LOW (ref 39.0–52.0)
Hemoglobin: 9.3 g/dL — ABNORMAL LOW (ref 13.0–17.0)
MCH: 30.9 pg (ref 26.0–34.0)
MCHC: 31.6 g/dL (ref 30.0–36.0)
MCV: 97.7 fL (ref 78.0–100.0)
PLATELETS: 177 10*3/uL (ref 150–400)
RBC: 3.01 MIL/uL — ABNORMAL LOW (ref 4.22–5.81)
RDW: 13.6 % (ref 11.5–15.5)
WBC: 8.7 10*3/uL (ref 4.0–10.5)

## 2017-08-31 LAB — BASIC METABOLIC PANEL
Anion gap: 7 (ref 5–15)
BUN: 33 mg/dL — AB (ref 6–20)
CALCIUM: 8.2 mg/dL — AB (ref 8.9–10.3)
CO2: 21 mmol/L — ABNORMAL LOW (ref 22–32)
CREATININE: 1.44 mg/dL — AB (ref 0.61–1.24)
Chloride: 111 mmol/L (ref 101–111)
GFR calc Af Amer: 46 mL/min — ABNORMAL LOW (ref >60)
GFR calc non Af Amer: 40 mL/min — ABNORMAL LOW (ref >60)
GLUCOSE: 107 mg/dL — AB (ref 65–99)
POTASSIUM: 4.1 mmol/L (ref 3.5–5.1)
SODIUM: 139 mmol/L (ref 135–145)

## 2017-08-31 MED ORDER — IPRATROPIUM-ALBUTEROL 0.5-2.5 (3) MG/3ML IN SOLN
3.0000 mL | Freq: Three times a day (TID) | RESPIRATORY_TRACT | Status: DC
  Administered 2017-08-31: 3 mL via RESPIRATORY_TRACT
  Filled 2017-08-31: qty 3

## 2017-08-31 MED ORDER — DEXTROSE 5 % IV SOLN
1.0000 g | INTRAVENOUS | Status: DC
  Administered 2017-08-31: 1 g via INTRAVENOUS
  Filled 2017-08-31 (×2): qty 10

## 2017-08-31 NOTE — Care Management Note (Signed)
Case Management Note  Patient Details  Name: AMARDEEP BECKERS MRN: [id number] Date of Birth: 01/13/1920  Subjective/Objective:  Qualified for home 02/home 02 order placed-[facility] rep Santiago Glad aware. Patient for d/c to home by [facility]-they will have 02 in ambulance. [facility] will deliver home 02 tanks to patient's home.                 Action/Plan:d/c home w/[facility]/02/[facility]   Expected Discharge Date:                  Expected Discharge Plan:  [place]  In-House Referral:     Discharge planning Services  CM Consult  Post Acute Care Choice:    Choice offered to:  Adult Children  DME Arranged:  Oxygen DME Agency:  [facility] Arranged:  PT, Nurse's Aide [place] Agency:  [facility]  Status of Service:  In process, will continue to follow  If discussed at Long Length of Stay Meetings, dates discussed:    Additional Comments:  Dessa Phi, RN 08/31/2017, 4:07 PM

## 2017-08-31 NOTE — Care Management Obs Status (Signed)
[facility] NOTIFICATION   Patient Details  Name: Roger [name] MRN: [id number] Date of Birth: [birth year]   Medicare Observation Status Notification Given:  Yes    MahabirJuliann Pulse, RN 08/31/2017, 1:40 PM

## 2017-08-31 NOTE — Progress Notes (Signed)
PROGRESS NOTE    Roger [name] [facility]  NGE:[id number] DOB: 03-07-1920 DOA: 08/29/2017 PCP: [name], MD   Brief Narrative: Roger [name] is a 84 y.o. male with medical history significant of  Hypertension, hyperlipidemia, osteoarthritis, GERD, chronic diastolic heart failure, depression, stage 3 CKD. MACULAR degeneration, brought in by his daughter for worsening sob, cough, congestion, since yesterday. On arrival to ED, he was diffusely wheezing, borderline sats on RA requiring 2 to 3 lit of [place] oxygen to keep sats greater than 90%. influenza pcr is negative. He was referred to medical service for admission for bronchospasm, acute bronchitis.       Assessment & Plan:   Active Problems:   Hyperlipidemia   Hypertension   GERD (gastroesophageal reflux disease)   Osteoarthritis   CKD (chronic kidney disease), stage III (HCC)   Depression   Diastolic CHF (HCC)   Bronchitis   Acute respiratory failure with hypoxia secondary to bronchitis and a component of bronchospasm  Admitted for steroids, duo nebs, azithromycin for bronchitis.  Influenza PCR is negative Nasal cannula oxygen to keep sats greater than 90%.  PT evaluation. He continues to require 4 lit of [place] oxygen on ambulation.  CT chest ordered, showed pneumonia, possibly aspiration.  Added rocephin, and ordere SLP eval.  Discussed the results with daughter at bedside.    Acute kidney injury on stage III CKD Repeat labs show improvement in creatinine.   Suspect secondary to dehydration.   Chronic diastolic heart failure Compensated  Hypertension well-controlled Mild normocytic anemia hemoglobin around 10    Hyperlipidemia resume statin   DVT prophylaxis: Lovenox Code Status: Full code Family Communication: None at bedside today. Disposition Plan: home in 1 to 2 days.  Consultants:  None  Procedures: None   Antimicrobials: Azithromycin and rocephin.    Subjective: Still requiring 4 lit of [place] oxygen.    No chest pain or sob while at rest.   Objective: Vitals:   08/30/17 2016 08/31/17 0445 08/31/17 1012 08/31/17 1437  BP: 104/64 135/70  112/71  Pulse: 89 79 83 76  Resp: 18 18 16 18   Temp: 97.9 F (36.6 C) 98 F (36.7 C)  98 F (36.7 C)  TempSrc: Oral Oral  Oral  SpO2: 93% 95% 90% 97%  Weight:      Height:        Intake/Output Summary (Last 24 hours) at 08/31/2017 1829 Last data filed at 08/31/2017 1437 Gross per 24 hour  Intake 1020 ml  Output 200 ml  Net 820 ml   Filed Weights   08/29/17 1801  Weight: 73.1 kg (161 lb 2.5 oz)    Examination:  General exam: Appears calm and comfortable on 4 lit of [place] oxygen.  Respiratory system: Clear to auscultation. Respiratory effort normal. Cardiovascular system: S1 & S2 heard, RRR. No JVD, murmurs, rubs, gallops or clicks. No pedal edema. Gastrointestinal system: Abdomen is nondistended, soft and nontender. No organomegaly or masses felt. Normal bowel sounds heard. Central nervous system: Alert and oriented. No focal neurological deficits. Hard of hearing.  Extremities: Symmetric 5 x 5 power. Skin: No rashes, lesions or ulcers Psychiatry: Mood & affect appropriate.     Data Reviewed: I have personally reviewed following labs and imaging studies  CBC: Recent Labs  Lab 08/29/17 1915 08/31/17 0546  WBC 7.4 8.7  NEUTROABS 6.8  --   HGB 10.4* 9.3*  HCT 32.4* 29.4*  MCV 97.9 97.7  PLT 179 401   Basic Metabolic Panel: Recent Labs  Lab 08/29/17 1405 08/31/17 0546  NA 140 139  K 4.1 4.1  CL 107 111  CO2 24 21*  GLUCOSE 93 107*  BUN 35* 33*  CREATININE 1.67* 1.44*  CALCIUM 8.8* 8.2*   GFR: Estimated Creatinine Clearance: 30.7 mL/min (A) (by C-G formula based on SCr of 1.44 mg/dL (H)). Liver Function Tests: No results for input(s): AST, ALT, ALKPHOS, BILITOT, PROT, ALBUMIN in the last 168 hours. No results for input(s): LIPASE, AMYLASE in the last 168 hours. No results for input(s): AMMONIA in the last 168  hours. Coagulation Profile: No results for input(s): INR, PROTIME in the last 168 hours. Cardiac Enzymes: No results for input(s): CKTOTAL, CKMB, CKMBINDEX, TROPONINI in the last 168 hours. BNP (last 3 results) No results for input(s): PROBNP in the last 8760 hours. HbA1C: No results for input(s): HGBA1C in the last 72 hours. CBG: No results for input(s): GLUCAP in the last 168 hours. Lipid Profile: No results for input(s): CHOL, HDL, LDLCALC, TRIG, CHOLHDL, LDLDIRECT in the last 72 hours. Thyroid Function Tests: No results for input(s): TSH, T4TOTAL, FREET4, T3FREE, THYROIDAB in the last 72 hours. Anemia Panel: No results for input(s): VITAMINB12, FOLATE, FERRITIN, TIBC, IRON, RETICCTPCT in the last 72 hours. Sepsis Labs: No results for input(s): PROCALCITON, LATICACIDVEN in the last 168 hours.  No results found for this or any previous visit (from the past 240 hour(s)).       Radiology Studies: Ct Chest High Resolution  Result Date: 08/31/2017 CLINICAL DATA:  Worsening shortness of breath, cough and congestion. Wheezing, decreased O2 sats. EXAM: CT CHEST WITHOUT CONTRAST TECHNIQUE: Multidetector CT imaging of the chest was performed following the standard protocol without intravenous contrast. High resolution imaging of the lungs, as well as inspiratory and expiratory imaging, was performed. COMPARISON:  Chest radiograph 08/29/2017 and 03/09/2015. FINDINGS: Cardiovascular: Atherosclerotic calcification of the arterial vasculature, including moderate to severe involvement of the coronary arteries. Heart is enlarged. No pericardial effusion. Mediastinum/Nodes: Mediastinal lymph nodes are not enlarged by CT size criteria. Hilar regions are difficult to evaluate without IV contrast. No axillary adenopathy. Esophagus is grossly unremarkable. Lungs/Pleura: Image quality is degraded by respiratory motion. Collapse/ consolidation both lower lobes, left greater than right. Chronic appearing  volume loss adjacent to an elevated left hemidiaphragm. Mild centrilobular emphysema. No pleural fluid. Debris and slight narrowing are seen involving the left lower lobe bronchus. Airway is otherwise unremarkable. Upper Abdomen: Multiple low-attenuation lesions are seen in the liver, measuring up to 2.6 cm. Definitive characterization is limited without post-contrast imaging but cysts are likely. Granular stone debris in the gallbladder. Adrenal glands and right kidney are unremarkable. There may be punctate stones in the left kidney. Visualized portions of the spleen, pancreas, stomach and bowel are grossly unremarkable. No upper abdominal adenopathy. Musculoskeletal: Degenerative changes and scoliosis in the spine. No worrisome lytic or sclerotic lesions. Degenerate changes in the shoulders. Lower thoracic compression deformity, chronic. IMPRESSION: 1. Bilateral lower lobe collapse/consolidation, left greater than right. Aspiration and/or pneumonia can have this appearance. 2. Apparent narrowing of the left lower lobe bronchus may be due to expiration and aspirated debris. Difficult to exclude a centrally obstructing lesion. 3. Aortic atherosclerosis (ICD10-170.0). Moderate to severe coronary artery calcification. 4.  Emphysema (ICD10-J43.9). 5. Cholelithiasis. 6. Question punctate left renal stones. Electronically Signed   By: Lorin Picket M.D.   On: 08/31/2017 16:11        Scheduled Meds: . aspirin  325 mg Oral Daily  . azithromycin  500 mg Oral  QHS  . enoxaparin (LOVENOX) injection  30 mg Subcutaneous Q24H  . fluticasone  2 spray Each Nare Daily  . guaiFENesin  1,200 mg Oral BID  . oxybutynin  2.5 mg Oral Daily  . pantoprazole  40 mg Oral Daily  . pravastatin  20 mg Oral q1800  . predniSONE  60 mg Oral Q breakfast   Continuous Infusions: . cefTRIAXone (ROCEPHIN)  IV       LOS: 0 days    Time spent: 35 minutes    Hosie Poisson, MD [facility] Pager [phone]  If  7PM-7AM, please contact night-coverage [email or web address] Password [email or web address] 08/31/2017, 6:29 PM

## 2017-08-31 NOTE — Care Management Note (Signed)
Case Management Note  Patient Details  Name: Roger [name] MRN: [id number] Date of Birth: 1920-01-01  Subjective/Objective: 84 y/o m admitted w/bronchitis. From home w/dtr. PT recc HHPT. May need home 02-[facility] following for 02 sats, & home 02 order if qualifies. Provided dtr w/private duty care info-she will call on her own. Used [facility] in past will use again-rep Drew aware of HHPT,aide.                  Action/Plan:d/c home w/HHC   Expected Discharge Date:                  Expected Discharge Plan:  [place]  In-House Referral:     Discharge planning Services  CM Consult  Post Acute Care Choice:    Choice offered to:  Adult Children  DME Arranged:    DME Agency:     HH Arranged:  PT, Nurse's Aide [place] Agency:  [place]  Status of Service:  In process, will continue to follow  If discussed at Long Length of Stay Meetings, dates discussed:    Additional Comments:  Dessa Phi, RN 08/31/2017, 1:41 PM

## 2017-08-31 NOTE — Evaluation (Signed)
Physical Therapy Evaluation Patient Details Name: Roger [name] MRN: [id number] DOB: 09/17/1919 Today's Date: 08/31/2017   History of Present Illness  84 y.o. male with medical history significant of  Hypertension, hyperlipidemia, osteoarthritis, GERD, chronic diastolic heart failure, depression, stage 3 CKD, macular degeneration, and admitted for Acute respiratory failure with hypoxia secondary to bronchitis and a component of bronchospasm  Clinical Impression  Pt admitted with above diagnosis. Pt currently with functional limitations due to the deficits listed below (see PT Problem List).  Pt will benefit from skilled PT to increase their independence and safety with mobility to allow discharge to the venue listed below.  Pt assisted with ambulating in hallway and requires supplemental oxygen.  Pt reports he has been home from SNF rehab for approx 4 weeks and prefers to d/c home from hospital.  Pt educated he may need to d/c home with supplemental oxygen at this time.  SATURATION QUALIFICATIONS: (This note is used to comply with regulatory documentation for home oxygen)  Patient Saturations on Room Air at Rest = 91%  Patient Saturations on Room Air while Ambulating = 81%  Patient Saturations on 4 Liters of oxygen while Ambulating = 91%  Please briefly explain why patient needs home oxygen: to improve oxygen saturations during physical activity such as ambulation.     Follow Up Recommendations Home health PT;Supervision/Assistance - 24 hour    Equipment Recommendations  None recommended by PT    Recommendations for Other Services       Precautions / Restrictions Precautions Precautions: Fall Precaution Comments: monitor sats      Mobility  Bed Mobility Overal bed mobility: Needs Assistance Bed Mobility: Supine to Sit     Supine to sit: Min guard;HOB elevated        Transfers Overall transfer level: Needs assistance Equipment used: Rolling walker (2  wheeled) Transfers: Sit to/from Stand Sit to Stand: Min assist         General transfer comment: verbal cues for technique, assist to rise and steady  Ambulation/Gait Ambulation/Gait assistance: Min assist Ambulation Distance (Feet): 80 Feet Assistive device: Rolling walker (2 wheeled) Gait Pattern/deviations: Step-through pattern;Decreased stride length;Antalgic;Decreased stance time - right     General Gait Details: pt with flexed posture and antalgic gait due to reported hip and knee pain (states they need to be replaced), assist for occasional steadying, SpO2 dropped to 81% on rooma air so reapplied 4L O2 [place] and SPO2 91%  Stairs            Wheelchair Mobility    Modified Rankin (Stroke Patients Only)       Balance Overall balance assessment: History of Falls                                           Pertinent Vitals/Pain Pain Assessment: Faces Faces Pain Scale: Hurts little more Pain Location: chronic R hip and knee pain from OA Pain Descriptors / Indicators: Sore Pain Intervention(s): Limited activity within patient's tolerance;Repositioned;Monitored during session    Home Living Family/patient expects to be discharged to:: Private residence Living Arrangements: Spouse/significant other;Children Available Help at Discharge: Family Type of Home: House Home Access: Other (comment)(chair lift)       Home Equipment: Walker - 4 wheels;Shower seat      Prior Function Level of Independence: Needs assistance   Gait / Transfers Assistance Needed: pt uses rollator at home  and states he will get w/c from wherever he visits in community, also reports he and his wife have a caregiver but did not state specifics (very HOH)           Hand Dominance        Extremity/Trunk Assessment        Lower Extremity Assessment Lower Extremity Assessment: Generalized weakness;RLE deficits/detail RLE Deficits / Details: reports R LE "gives out" at  times due to hip and knee OA (states they need to be replaced)       Communication   Communication: HOH  Cognition Arousal/Alertness: Awake/alert Behavior During Therapy: WFL for tasks assessed/performed Overall Cognitive Status: Within Functional Limits for tasks assessed                                        General Comments      Exercises     Assessment/Plan    PT Assessment Patient needs continued PT services  PT Problem List Decreased strength;Decreased mobility;Decreased balance;Decreased activity tolerance;Decreased knowledge of use of DME;Pain;Cardiopulmonary status limiting activity       PT Treatment Interventions Gait training;Therapeutic exercise;Patient/family education;DME instruction;Therapeutic activities;Functional mobility training;Balance training    PT Goals (Current goals can be found in the Care Plan section)  Acute Rehab PT Goals PT Goal Formulation: With patient Time For Goal Achievement: 09/14/17 Potential to Achieve Goals: Good    Frequency Min 3X/week   Barriers to discharge        Co-evaluation               AM-PAC PT "6 Clicks" Daily Activity  Outcome Measure Difficulty turning over in bed (including adjusting bedclothes, sheets and blankets)?: None Difficulty moving from lying on back to sitting on the side of the bed? : A Lot Difficulty sitting down on and standing up from a chair with arms (e.g., wheelchair, bedside commode, etc,.)?: Unable Help needed moving to and from a bed to chair (including a wheelchair)?: A Little Help needed walking in hospital room?: A Little Help needed climbing 3-5 steps with a railing? : A Lot 6 Click Score: 15    End of Session Equipment Utilized During Treatment: Gait belt;Oxygen Activity Tolerance: Patient tolerated treatment well Patient left: in chair;with call bell/phone within reach   PT Visit Diagnosis: Other abnormalities of gait and mobility (R26.89)    Time:  7564-3329 PT Time Calculation (min) (ACUTE ONLY): 26 min   Charges:   PT Evaluation $PT Eval Moderate Complexity: 1 Mod     PT G Codes:        Carmelia Bake, PT, DPT 08/31/2017 Pager: [phone]  York Ram E 08/31/2017, 12:23 PM

## 2017-08-31 NOTE — Progress Notes (Signed)
PTAR forms on shadow chart-Nurse can call PTAR when ready.

## 2017-09-01 ENCOUNTER — Inpatient Hospital Stay (HOSPITAL_COMMUNITY): Payer: Medicare Other

## 2017-09-01 DIAGNOSIS — K219 Gastro-esophageal reflux disease without esophagitis: Secondary | ICD-10-CM

## 2017-09-01 MED ORDER — AMOXICILLIN-POT CLAVULANATE 875-125 MG PO TABS: 1.0000 | ORAL_TABLET | Freq: Two times a day (BID) | ORAL | 0 refills | Status: AC

## 2017-09-01 NOTE — Progress Notes (Signed)
Discharged to home via [facility]. Family and pt educated on discharge instructions. IV removed.

## 2017-09-01 NOTE — Progress Notes (Addendum)
Modified Barium Swallow Progress Note  Patient Details  Name: Roger [name] MRN: [id number] Date of Birth: Apr 26, 1920  Today's Date: 09/01/2017  Modified Barium Swallow completed.  Full report located under Chart Review in the Imaging Section.  Brief recommendations include the following:  Clinical Impression  Pt presents with significant pharyngeal dysphagia characterized by decreased epiglottic deflection/laryngeal elevation resulting in poor UES opening and subsequent gross residuals in pharynx which pt does NOT sense.  When pt attempted to swallow cracker bolus, it was fully retained in pharynx!  Multiple liquid swallows decreased solid residuals and dry swallows decrease residuals of liquids.  Chin tuck posture ineffective as it dumps pyriform sinus residuals into pyriform sinus.  Dry swallows ineffective to decrease solid residual. Pt with trace laryngeal penetration of thin liquids but largest issue is gross residuals.  Suspect pt likely has low grade chronic aspiration and strict precautions recommended for him.  Swallow function is worse than during prior MBS.  Using teach back, pt educated to findings/recommendations.  Pt poignantly stated- "I'm 89, I don't have much longer to go".    Information tubed to floor for secretary to provided to pt and family.  MD notified of findings via text message.    Swallow Evaluation Recommendations       SLP Diet Recommendations: Dysphagia 3 (Mech soft) solids;Thin liquid   Liquid Administration via: Cup   Medication Administration: Crushed with puree   Supervision: Patient able to self feed   Compensations: Slow rate;Small sips/bites;Multiple dry swallows after each bite/sip;Follow solids with liquid   Postural Changes: Remain semi-upright after after feeds/meals (Comment);Seated upright at 90 degrees   Oral Care Recommendations: Oral care QID       Luanna Salk, [place] [facility] SLP [phone]  Macario Golds 09/01/2017,1:57 PM

## 2017-09-01 NOTE — Progress Notes (Signed)
MBS completed, full report to follow.  Pt presents with significant pharyngeal dysphagia characterized by decreased epiglottic deflection/laryngeal elevation resulting in poor UES opening and subsequent gross residuals in pharynx which pt does NOT sense.  When pt attempted to swallow cracker bolus, it was fully retained in pharynx!  Multiple liquid swallows decreased solid residuals and dry swallows decrease residuals of liquids.  Chin tuck posture ineffective as it dumps pyriform sinus residuals into pyriform sinus.  Dry swallows ineffective to decrease solid residual. Pt with trace laryngeal penetration of thin liquids but largest issue is gross residuals.  Suspect pt likely has low grade chronic aspiration and strict precautions recommended for him.  Swallow function is worse than during prior MBS.  Pt poignantly stated- "I'm 89, I don't have much longer to go".    Dys3/ground meats/thin Start meal with liquids Follow bite of solid with multiple sips of liquids Dry swallows with liquids Medicine crushed with puree Keep cough/hock [place], [place] [facility] SLP [phone]

## 2017-09-01 NOTE — Evaluation (Addendum)
SLP Cancellation Note  Patient Details Name: Roger [name] MRN: [id number] DOB: 1920-01-01   Cancelled treatment:       Reason Eval/Treat Not Completed: Other (comment)(pt agreeable to MD at approximately noon today.  Luanna Salk, [place] [facility] SLP [place], [place] 09/01/2017, 11:11 AM

## 2017-09-01 NOTE — Progress Notes (Addendum)
Order for swallow evaluation received, spoke to Dr Karleen Hampshire. Given h/o dysphagia, concern for aspiration with osteophytes with proceed with MBS hopefully today- MD agreed. Luanna Salk, [facility] SLP [phone]

## 2017-09-01 NOTE — Progress Notes (Signed)
Physical Therapy Treatment Patient Details Name: Roger [name] MRN: [id number] DOB: 1920/05/07 Today's Date: 09/01/2017    History of Present Illness 84 y.o. male with medical history significant of  Hypertension, hyperlipidemia, osteoarthritis, GERD, chronic diastolic heart failure, depression, stage 3 CKD, macular degeneration, and admitted for Acute respiratory failure with hypoxia secondary to bronchitis and a component of bronchospasm    PT Comments    Pt assisted with ambulating in hallway and monitored saturations.  Pt educated on O2 and amount provided.  Pt also aware he will need to d/c home on oxygen.  Notified RN of saturations and aware pt left on 2L O2 at rest as SpO2 was 95% (was on 4L at rest with SPO2 99% upon entering room and MD requested to wean oxygen if able) upon returning to room and sitting in recliner.  SATURATION QUALIFICATIONS: (This note is used to comply with regulatory documentation for home oxygen)  Patient Saturations on Room Air at Rest = 85%  Patient Saturations on Room Air while Ambulating = N/A  Patient Saturations on 3 Liters of oxygen while Ambulating = 92%  Please briefly explain why patient needs home oxygen: to improve oxygen saturations at rest and during physical activity.  Attempted 2L O2 with ambulation however saturations only 87% so increased to 3L as above.   Follow Up Recommendations  Home health PT;Supervision/Assistance - 24 hour     Equipment Recommendations  None recommended by PT    Recommendations for Other Services       Precautions / Restrictions Precautions Precautions: Fall Precaution Comments: monitor sats    Mobility  Bed Mobility Overal bed mobility: Needs Assistance Bed Mobility: Supine to Sit     Supine to sit: HOB elevated;Supervision     General bed mobility comments: increased time and effort  Transfers Overall transfer level: Needs assistance Equipment used: Rolling walker (2 wheeled) Transfers:  Sit to/from Stand Sit to Stand: Min guard         General transfer comment: increased time to rise and steady, min/guard for safety  Ambulation/Gait Ambulation/Gait assistance: Min assist Ambulation Distance (Feet): 60 Feet Assistive device: Rolling walker (2 wheeled) Gait Pattern/deviations: Step-through pattern;Decreased stride length;Antalgic;Decreased stance time - right     General Gait Details: pt with flexed posture and antalgic gait, pt did not require physical assist however does require frequent standing rest breaks; pt continues to require supplemental oxygen however only 3L today compared to 4L yesterday   Stairs            Wheelchair Mobility    Modified Rankin (Stroke Patients Only)       Balance                                            Cognition Arousal/Alertness: Awake/alert Behavior During Therapy: WFL for tasks assessed/performed Overall Cognitive Status: Within Functional Limits for tasks assessed                                        Exercises      General Comments        Pertinent Vitals/Pain Pain Assessment: No/denies pain Pain Intervention(s): Repositioned    Home Living  Prior Function            PT Goals (current goals can now be found in the care plan section) Progress towards PT goals: Progressing toward goals    Frequency    Min 3X/week      PT Plan Current plan remains appropriate    Co-evaluation              AM-PAC PT "6 Clicks" Daily Activity  Outcome Measure  Difficulty turning over in bed (including adjusting bedclothes, sheets and blankets)?: None Difficulty moving from lying on back to sitting on the side of the bed? : A Lot Difficulty sitting down on and standing up from a chair with arms ([name].g., wheelchair, bedside commode, etc,.)?: A Lot Help needed moving to and from a bed to chair (including a wheelchair)?: A Little Help  needed walking in hospital room?: A Little Help needed climbing 3-5 steps with a railing? : A Lot 6 Click Score: 16    End of Session Equipment Utilized During Treatment: Gait belt;Oxygen Activity Tolerance: Patient tolerated treatment well Patient left: in chair;with call bell/phone within reach Nurse Communication: Mobility status PT Visit Diagnosis: Other abnormalities of gait and mobility (R26.89)     Time: 9604-5409 PT Time Calculation (min) (ACUTE ONLY): 24 min  Charges:  $Gait Training: 23-37 mins                    G Codes:       Roger [name], PT, DPT 09/01/2017 Pager: [phone]  Roger [name] 09/01/2017, 12:38 PM

## 2017-09-01 NOTE — Care Management Note (Signed)
Case Management Note  Patient Details  Name: CARMON SAHLI MRN: [id number] Date of Birth: May 25, 1920  Subjective/Objective: ST to have MBS today. 02 sats to be checked again. [facility] following for home 02 to be delivered to home prior d/c by PTAR.                  Action/Plan:   Expected Discharge Date:                  Expected Discharge Plan:  [place]  In-House Referral:     Discharge planning Services  CM Consult  Post Acute Care Choice:    Choice offered to:  Adult Children  DME Arranged:  Oxygen DME Agency:  [place] Arranged:  PT, Nurse's Aide, Speech Therapy, RN [place] Agency:  [place]  Status of Service:  Completed, signed off  If discussed at [place] of Stay Meetings, dates discussed:    Additional Comments:  Dessa Phi, RN 09/01/2017, 11:46 AM

## 2017-09-02 ENCOUNTER — Telehealth: Payer: Self-pay | Admitting: *Deleted

## 2017-09-02 NOTE — Telephone Encounter (Signed)
Per chart review: Brief Narrative: Roger [name] a 84 y.o.malewith medical history significant ofHypertension, hyperlipidemia, osteoarthritis, GERD, chronic diastolic heart failure, depression, stage 3 CKD. MACULAR degeneration, brought in by his daughter for worsening sob, cough, congestion, since yesterday. On arrival to ED, he was diffusely wheezing, borderline sats on RA requiring 2 to 3 lit of [place] oxygen to keep sats greater than 90%. influenza pcr is negative. He was referred to medical service for admission for bronchospasm, acute bronchitis.   Per TCM Call: Transition Care Management Follow-up Telephone Call   Date discharged? 07/02/17   How have you been since you were released from the hospital? "not coughing as much"   Do you understand why you were in the hospital? yes   Do you understand the discharge instructions? yes   Where were you discharged to? Home   Items Reviewed:  Medications reviewed: yes  Allergies reviewed: yes  Dietary changes reviewed: yes  Referrals reviewed: yes   Functional Questionnaire:   Activities of Daily Living (ADLs):   He states they are independent in the following: Patients wife states that he is weak and she is helping him but home health is supposed to be coming in States they require assistance with the following: Awaiting home health I asked Roger [name] to call office on Friday if they do not hear from [facility] tomorrow. Per Case Management:  DME Arranged:  Oxygen DME Agency:  [place] Arranged:  PT, Nurse's Aide, Speech Therapy, RN [place] Agency:  [facility]   Patient does have oxygen at home but they have no heard from [place] yet.  Any transportation issues/concerns?: no   Any patient concerns? no   Confirmed importance and date/time of follow-up visits scheduled yes  Provider Appointment booked with Dr Yong Channel 07/10/18 11:30  Confirmed with patient if condition begins to  worsen call PCP or go to the ER.  Patient was given the office number and encouraged to call back with question or concerns.  : yes

## 2017-09-02 NOTE — Telephone Encounter (Signed)
noted thanks  

## 2017-09-03 ENCOUNTER — Ambulatory Visit: Payer: Self-pay

## 2017-09-03 ENCOUNTER — Telehealth: Payer: Self-pay | Admitting: Family Medicine

## 2017-09-03 DIAGNOSIS — I129 Hypertensive chronic kidney disease with stage 1 through stage 4 chronic kidney disease, or unspecified chronic kidney disease: Secondary | ICD-10-CM | POA: Diagnosis not present

## 2017-09-03 DIAGNOSIS — L97321 Non-pressure chronic ulcer of left ankle limited to breakdown of skin: Secondary | ICD-10-CM | POA: Diagnosis not present

## 2017-09-03 DIAGNOSIS — Z87891 Personal history of nicotine dependence: Secondary | ICD-10-CM | POA: Diagnosis not present

## 2017-09-03 DIAGNOSIS — I87322 Chronic venous hypertension (idiopathic) with inflammation of left lower extremity: Secondary | ICD-10-CM | POA: Diagnosis not present

## 2017-09-03 DIAGNOSIS — I5030 Unspecified diastolic (congestive) heart failure: Secondary | ICD-10-CM | POA: Diagnosis not present

## 2017-09-03 DIAGNOSIS — Z9981 Dependence on supplemental oxygen: Secondary | ICD-10-CM | POA: Diagnosis not present

## 2017-09-03 DIAGNOSIS — J209 Acute bronchitis, unspecified: Secondary | ICD-10-CM | POA: Diagnosis not present

## 2017-09-03 DIAGNOSIS — Z85828 Personal history of other malignant neoplasm of skin: Secondary | ICD-10-CM | POA: Diagnosis not present

## 2017-09-03 DIAGNOSIS — R1312 Dysphagia, oropharyngeal phase: Secondary | ICD-10-CM | POA: Diagnosis not present

## 2017-09-03 DIAGNOSIS — N183 Chronic kidney disease, stage 3 (moderate): Secondary | ICD-10-CM | POA: Diagnosis not present

## 2017-09-03 NOTE — Discharge Summary (Signed)
Physician Discharge Summary  Roger [name] [facility] ZOX:[id number] DOB: [birth year] DOA: 08/29/2017  PCP: [name], MD  Admit date: 08/29/2017 Discharge date: 09/01/2017  Admitted From: Home.  Disposition:  Home.   Recommendations for Outpatient Follow-up:  1. Follow up with PCP in 1-2 weeks 2. Please obtain BMP/CBC in one week 3. Please follow up  With a repeat CT in 6 weeks.   Home Health: yes with oxygen.   Discharge Condition:stable.  CODE STATUS: full code.  Diet recommendation: Heart Healthy  Brief/Interim Summary: Roger Vigen Meloniis a 84 y.o.malewith medical history significant ofHypertension, hyperlipidemia, osteoarthritis, GERD, chronic diastolic heart failure, depression, stage 3 CKD. MACULAR degeneration, brought in by his daughter for worsening sob, cough, congestion, since yesterday. On arrival to ED, he was diffusely wheezing, borderline sats on RA requiring 2 to 3 lit of [place] oxygen to keep sats greater than 90%. influenza pcr is negative. He was referred to medical service for admission for bronchospasm, acute bronchitis.    Discharge Diagnoses:  Active Problems:   Hyperlipidemia   Hypertension   GERD (gastroesophageal reflux disease)   Osteoarthritis   CKD (chronic kidney disease), stage III (HCC)   Depression   Diastolic CHF (HCC)   Bronchitis aspiration pneumonia.    Acute respiratory failure with hypoxia secondary to bronchitis and a component of bronchospasm/ aspiration pneumonia.   Admitted for steroids, duo nebs, azithromycin for bronchitis.  Influenza PCR is negative Nasal cannula oxygen to keep sats greater than 90%.  PT evaluation. He continues to require 4 lit of [place] oxygen on ambulation.  CT chest ordered, showed pneumonia, possibly aspiration.  Added rocephin, and ordere SLP eval.  SLP evaL, MBS done and diet changed.  Discussed the results with daughter at bedside.  Discharged on oral antibitoic to complete the course of pneumonia.    Acute  kidney injury on stage III CKD Repeat labs show improvement in creatinine.   Suspect secondary to dehydration    Chronic diastolic heart failure Compensated  Hypertension well-controlled Mild normocytic anemia hemoglobin around 10    Hyperlipidemia resume statin    Discharge Instructions  Discharge Instructions    Diet - low sodium heart healthy   Complete by:  As directed    Discharge instructions   Complete by:  As directed    Please follow up with PCP in one week and please follow the below diet as per SLP.  Dys3/ground meats/thin Start meal with liquids Follow bite of solid with multiple sips of liquids Dry swallows with liquids Medicine crushed with puree Keep cough/hock strong.   We have stopped the losartan for your renal function , please check with your PCP before restarting the medication.   Increase activity slowly   Complete by:  As directed      Allergies as of 09/01/2017      Reactions   Amlodipine Besylate    REACTION: swelling of feet   Aspirin    REACTION: GI upset can tolerate ecotrin   Hydrocodone    GI upset      Medication List    STOP taking these medications   losartan 100 MG tablet Commonly known as:  COZAAR     TAKE these medications   amoxicillin-clavulanate 875-125 MG tablet Commonly known as:  AUGMENTIN Take 1 tablet by mouth every 12 (twelve) hours for 7 days.   ECOTRIN 325 MG EC tablet Generic drug:  aspirin Take 325 mg by mouth daily.   fluticasone 50 MCG/ACT nasal spray Commonly known  as:  FLONASE Place 2 sprays into both nostrils daily.   furosemide 20 MG tablet Commonly known as:  LASIX Take 1 tablet (20 mg total) daily by mouth.   guaiFENesin 600 MG 12 hr tablet Commonly known as:  MUCINEX Take 2 tablets (1,200 mg total) by mouth 2 (two) times daily.   lovastatin 40 MG tablet Commonly known as:  MEVACOR Take 1 tablet (40 mg total) by mouth daily.   multivitamin with minerals tablet Take 1 tablet by  mouth daily.   omeprazole 20 MG capsule Commonly known as:  PRILOSEC TAKE ONE CAPSULE BY MOUTH TWICE A DAY BEFORE A MEAL   oxybutynin 5 MG tablet Commonly known as:  DITROPAN Take 0.5 tablets (2.5 mg total) by mouth daily.   UNABLE TO FIND Med Name: Med pass 120 mL by mouth twice daily for supplement      Follow-up Information    [place], [place] Follow up.   Specialty:  [place] Why:  [place] physical therapy/HH aide Contact information: [address] [place] [address] [address] Follow up.   Why:  home oxygen Contact information: [place] [address] [phone]        [name], MD. Schedule an appointment as soon as possible for a visit in 1 week(s).   Specialty:  Family Medicine Contact information: [address] [address] [phone]          Allergies  Allergen Reactions  . Amlodipine Besylate     REACTION: swelling of feet  . Aspirin     REACTION: GI upset can tolerate ecotrin  . Hydrocodone     GI upset    Consultations:  None.   Procedures/Studies: Dg Chest 2 View  Result Date: 08/29/2017 CLINICAL DATA:  Cough and weakness for 2 days. EXAM: CHEST  2 VIEW COMPARISON:  03/09/2015 and prior radiographs FINDINGS: Upper limits normal heart size noted. This is a low volume film with bibasilar atelectasis. No definite airspace disease, pleural effusion or pneumothorax. An L1 compression fracture is unchanged. IMPRESSION: Low volume film with bibasilar atelectasis. Electronically Signed   By: Margarette Canada M.D.   On: 08/29/2017 15:42   Ct Chest High Resolution  Result Date: 08/31/2017 CLINICAL DATA:  Worsening shortness of breath, cough and congestion. Wheezing, decreased O2 sats. EXAM: CT CHEST WITHOUT CONTRAST TECHNIQUE: Multidetector CT imaging of the chest was performed following the standard protocol without  intravenous contrast. High resolution imaging of the lungs, as well as inspiratory and expiratory imaging, was performed. COMPARISON:  Chest radiograph 08/29/2017 and 03/09/2015. FINDINGS: Cardiovascular: Atherosclerotic calcification of the arterial vasculature, including moderate to severe involvement of the coronary arteries. Heart is enlarged. No pericardial effusion. Mediastinum/Nodes: Mediastinal lymph nodes are not enlarged by CT size criteria. Hilar regions are difficult to evaluate without IV contrast. No axillary adenopathy. Esophagus is grossly unremarkable. Lungs/Pleura: Image quality is degraded by respiratory motion. Collapse/ consolidation both lower lobes, left greater than right. Chronic appearing volume loss adjacent to an elevated left hemidiaphragm. Mild centrilobular emphysema. No pleural fluid. Debris and slight narrowing are seen involving the left lower lobe bronchus. Airway is otherwise unremarkable. Upper Abdomen: Multiple low-attenuation lesions are seen in the liver, measuring up to 2.6 cm. Definitive characterization is limited without post-contrast imaging but cysts are likely. Granular stone debris in the gallbladder. Adrenal glands and right kidney are unremarkable.  There may be punctate stones in the left kidney. Visualized portions of the spleen, pancreas, stomach and bowel are grossly unremarkable. No upper abdominal adenopathy. Musculoskeletal: Degenerative changes and scoliosis in the spine. No worrisome lytic or sclerotic lesions. Degenerate changes in the shoulders. Lower thoracic compression deformity, chronic. IMPRESSION: 1. Bilateral lower lobe collapse/consolidation, left greater than right. Aspiration and/or pneumonia can have this appearance. 2. Apparent narrowing of the left lower lobe bronchus may be due to expiration and aspirated debris. Difficult to exclude a centrally obstructing lesion. 3. Aortic atherosclerosis (ICD10-170.0). Moderate to severe coronary artery  calcification. 4.  Emphysema (ICD10-J43.9). 5. Cholelithiasis. 6. Question punctate left renal stones. Electronically Signed   By: Lorin Picket M.D.   On: 08/31/2017 16:11   Dg Swallowing Func-speech Pathology  Result Date: 09/01/2017 Objective Swallowing Evaluation: Type of Study: Bedside Swallow Evaluation  Patient Details Name: IOKEPA GEFFRE MRN: [id number] Date of Birth: 12/16/1919 Today's Date: 09/01/2017 Time: SLP Start Time (ACUTE ONLY): 1226 -SLP Stop Time (ACUTE ONLY): 1300 SLP Time Calculation (min) (ACUTE ONLY): 34 min Past Medical History: Past Medical History: Diagnosis Date . GERD 02/22/2007 . HYPERLIPIDEMIA 02/22/2007 . HYPERTENSION 06/17/2007 . MACULAR DEGENERATION 02/22/2007 . OSTEOARTHRITIS, HIP, RIGHT 06/05/2009 Past Surgical History: Past Surgical History: Procedure Laterality Date . LAMINECTOMY  1988 HPI: 84 yo male adm to [facility]/29/16 with bronchitis.  PMH + for GERD, macular degeneration, hearing loss, CHF, depression, OEA, CKD.  CT chest showed emphysema, ? aspiration or pna - ? aspirated debris in left lower airways due to narrowing, bilateral lower lobe collapse/consolidation.   Subjective: pt awake in chair Assessment / Plan / Recommendation CHL IP CLINICAL IMPRESSIONS 09/01/2017 Clinical Impression Pt presents with significant pharyngeal dysphagia characterized by decreased epiglottic deflection/laryngeal elevation resulting in poor UES opening and subsequent gross residuals in pharynx which pt does NOT sense.  When pt attempted to swallow cracker bolus, it was fully retained in pharynx!  Multiple liquid swallows decreased solid residuals and dry swallows decrease residuals of liquids.  Chin tuck posture ineffective as it dumps pyriform sinus residuals into pyriform sinus.  Dry swallows ineffective to decrease solid residual. Pt with trace laryngeal penetration of thin liquids but largest issue is gross residuals.  Suspect pt likely has low grade chronic aspiration and strict precautions  recommended for him.  Swallow function is worse than during prior MBS.  Using teach back, pt educated to findings/recommendations.  Pt poignantly stated- "I'm 89, I don't have much longer to go".   SLP Visit Diagnosis Dysphagia, pharyngeal phase (R13.13);Dysphagia, pharyngoesophageal phase (R13.14) Attention and concentration deficit following -- Frontal lobe and executive function deficit following -- Impact on safety and function Moderate aspiration risk;Risk for inadequate nutrition/hydration   CHL IP TREATMENT RECOMMENDATION 09/01/2017 Treatment Recommendations No treatment recommended at this time   Prognosis 03/10/2015 Prognosis for Safe Diet Advancement Good Barriers to Reach Goals -- Barriers/Prognosis Comment -- CHL IP DIET RECOMMENDATION 09/01/2017 SLP Diet Recommendations Dysphagia 3 (Mech soft) solids;Thin liquid Liquid Administration via Cup Medication Administration Crushed with puree Compensations Slow rate;Small sips/bites;Multiple dry swallows after each bite/sip;Follow solids with liquid Postural Changes Remain semi-upright after after feeds/meals (Comment);Seated upright at 90 degrees   CHL IP OTHER RECOMMENDATIONS 09/01/2017 Recommended Consults -- Oral Care Recommendations Oral care QID Other Recommendations --   CHL IP FOLLOW UP RECOMMENDATIONS 09/01/2017 Follow up Recommendations None   CHL IP FREQUENCY AND DURATION 03/10/2015 Speech Therapy Frequency (ACUTE ONLY) min 1 x/week Treatment Duration 1 week      CHL  IP ORAL PHASE 09/01/2017 Oral Phase WFL Oral - Pudding Teaspoon -- Oral - Pudding Cup -- Oral - Honey Teaspoon -- Oral - Honey Cup -- Oral - Nectar Teaspoon -- Oral - Nectar Cup -- Oral - Nectar Straw -- Oral - Thin Teaspoon -- Oral - Thin Cup -- Oral - Thin Straw -- Oral - Puree -- Oral - Mech Soft -- Oral - Regular -- Oral - Multi-Consistency -- Oral - Pill -- Oral Phase - Comment --  CHL IP PHARYNGEAL PHASE 09/01/2017 Pharyngeal Phase Impaired Pharyngeal- Pudding Teaspoon -- Pharyngeal --  Pharyngeal- Pudding Cup -- Pharyngeal -- Pharyngeal- Honey Teaspoon -- Pharyngeal -- Pharyngeal- Honey Cup -- Pharyngeal -- Pharyngeal- Nectar Teaspoon -- Pharyngeal -- Pharyngeal- Nectar Cup Reduced pharyngeal peristalsis;Reduced epiglottic inversion;Reduced anterior laryngeal mobility;Reduced laryngeal elevation;Reduced airway/laryngeal closure;Reduced tongue base retraction;Pharyngeal residue - valleculae;Pharyngeal residue - pyriform;Pharyngeal residue - cp segment;Pharyngeal residue - posterior pharnyx Pharyngeal -- Pharyngeal- Nectar Straw -- Pharyngeal -- Pharyngeal- Thin Teaspoon Reduced pharyngeal peristalsis;Reduced epiglottic inversion;Reduced anterior laryngeal mobility;Reduced laryngeal elevation;Reduced airway/laryngeal closure;Pharyngeal residue - valleculae;Pharyngeal residue - pyriform;Pharyngeal residue - posterior pharnyx;Reduced tongue base retraction;Pharyngeal residue - cp segment Pharyngeal -- Pharyngeal- Thin Cup Reduced pharyngeal peristalsis;Reduced epiglottic inversion;Reduced anterior laryngeal mobility;Reduced laryngeal elevation;Reduced airway/laryngeal closure;Reduced tongue base retraction;Penetration/Aspiration during swallow;Pharyngeal residue - cp segment;Pharyngeal residue - pyriform;Pharyngeal residue - valleculae Pharyngeal Material enters airway, remains ABOVE vocal cords and not ejected out Pharyngeal- Thin Straw Reduced airway/laryngeal closure;Reduced tongue base retraction;Penetration/Aspiration before swallow;Reduced epiglottic inversion;Reduced anterior laryngeal mobility;Reduced laryngeal elevation;Pharyngeal residue - valleculae;Pharyngeal residue - pyriform;Pharyngeal residue - posterior pharnyx;Pharyngeal residue - cp segment Pharyngeal Material does not enter airway Pharyngeal- Puree Reduced pharyngeal peristalsis;Reduced epiglottic inversion;Reduced anterior laryngeal mobility;Reduced airway/laryngeal closure;Reduced laryngeal elevation;Reduced tongue base  retraction;Pharyngeal residue - valleculae;Pharyngeal residue - pyriform;Pharyngeal residue - posterior pharnyx;Pharyngeal residue - cp segment Pharyngeal -- Pharyngeal- Mechanical Soft Reduced epiglottic inversion;Reduced anterior laryngeal mobility;Reduced laryngeal elevation;Reduced pharyngeal peristalsis;Reduced airway/laryngeal closure;Pharyngeal residue - valleculae;Pharyngeal residue - pyriform;Pharyngeal residue - posterior pharnyx;Pharyngeal residue - cp segment;Reduced tongue base retraction Pharyngeal -- Pharyngeal- Regular -- Pharyngeal -- Pharyngeal- Multi-consistency -- Pharyngeal -- Pharyngeal- Pill -- Pharyngeal -- Pharyngeal Comment chin tuck did not help swallow  CHL IP CERVICAL ESOPHAGEAL PHASE 09/01/2017 Cervical Esophageal Phase Impaired Pudding Teaspoon -- Pudding Cup -- Honey Teaspoon -- Honey Cup -- Nectar Teaspoon -- Nectar Cup -- Nectar Straw -- Thin Teaspoon -- Thin Cup -- Thin Straw -- Puree -- Mechanical Soft -- Regular -- Multi-consistency -- Pill -- Cervical Esophageal Comment decreased upper esophageal opening due to decreased laryngeal elevation CHL IP GO 11/18/2013 Functional Assessment Tool Used ASHA NOMS Functional Limitations Swallowing Swallow Current Status (Z6109) CI Swallow Goal Status (U0454) CI Swallow Discharge Status (U9811) CI Luanna Salk, MS [facility] SLP [phone]                 Subjective: No new complaints.   Discharge Exam: Vitals:   09/01/17 0410 09/01/17 2124  BP: 131/78 137/69  Pulse: 74 69  Resp:  18  Temp: 98.5 F (36.9 C) 97.8 F (36.6 C)  SpO2: 94% 93%   Vitals:   08/31/17 1437 08/31/17 2026 09/01/17 0410 09/01/17 2124  BP: 112/71 119/66 131/78 137/69  Pulse: 76 73 74 69  Resp: 18   18  Temp: 98 F (36.7 C) 98.4 F (36.9 C) 98.5 F (36.9 C) 97.8 F (36.6 C)  TempSrc: Oral Oral Oral Oral  SpO2: 97% 93% 94% 93%  Weight:      Height:        General:  Pt is alert, awake, not in acute distress Cardiovascular: RRR, S1/S2 +, no rubs, no  gallops Respiratory: CTA bilaterally, no wheezing, no rhonchi Abdominal: Soft, NT, ND, bowel sounds + Extremities: no edema, no cyanosis    The results of significant diagnostics from this hospitalization (including imaging, microbiology, ancillary and laboratory) are listed below for reference.     Microbiology: No results found for this or any previous visit (from the past 240 hour(s)).   Labs: BNP (last 3 results) Recent Labs    08/29/17 1405  BNP 973.5*   Basic Metabolic Panel: Recent Labs  Lab 08/29/17 1405 08/31/17 0546  NA 140 139  K 4.1 4.1  CL 107 111  CO2 24 21*  GLUCOSE 93 107*  BUN 35* 33*  CREATININE 1.67* 1.44*  CALCIUM 8.8* 8.2*   Liver Function Tests: No results for input(s): AST, ALT, ALKPHOS, BILITOT, PROT, ALBUMIN in the last 168 hours. No results for input(s): LIPASE, AMYLASE in the last 168 hours. No results for input(s): AMMONIA in the last 168 hours. CBC: Recent Labs  Lab 08/29/17 1915 08/31/17 0546  WBC 7.4 8.7  NEUTROABS 6.8  --   HGB 10.4* 9.3*  HCT 32.4* 29.4*  MCV 97.9 97.7  PLT 179 177   Cardiac Enzymes: No results for input(s): CKTOTAL, CKMB, CKMBINDEX, TROPONINI in the last 168 hours. BNP: Invalid input(s): POCBNP CBG: No results for input(s): GLUCAP in the last 168 hours. D-Dimer No results for input(s): DDIMER in the last 72 hours. Hgb A1c No results for input(s): HGBA1C in the last 72 hours. Lipid Profile No results for input(s): CHOL, HDL, LDLCALC, TRIG, CHOLHDL, LDLDIRECT in the last 72 hours. Thyroid function studies No results for input(s): TSH, T4TOTAL, T3FREE, THYROIDAB in the last 72 hours.  Invalid input(s): FREET3 Anemia work up No results for input(s): VITAMINB12, FOLATE, FERRITIN, TIBC, IRON, RETICCTPCT in the last 72 hours. Urinalysis    Component Value Date/Time   COLORURINE YELLOW 03/08/2015 1710   APPEARANCEUR CLOUDY (A) 03/08/2015 1710   LABSPEC 1.023 03/08/2015 1710   PHURINE 5.5 03/08/2015  1710   GLUCOSEU NEGATIVE 03/08/2015 1710   HGBUR NEGATIVE 03/08/2015 1710   BILIRUBINUR NEGATIVE 03/08/2015 1710   BILIRUBINUR neg 12/07/2013 1557   KETONESUR NEGATIVE 03/08/2015 1710   PROTEINUR 30 (A) 03/08/2015 1710   UROBILINOGEN 0.2 03/08/2015 1710   NITRITE NEGATIVE 03/08/2015 1710   LEUKOCYTESUR NEGATIVE 03/08/2015 1710   Sepsis Labs Invalid input(s): PROCALCITONIN,  WBC,  LACTICIDVEN Microbiology No results found for this or any previous visit (from the past 240 hour(s)).   Time coordinating discharge: Over 30 minutes  SIGNED:   Hosie Poisson, MD  [facility] 09/03/2017, 1:02 PM Pager   If 7PM-7AM, please contact night-coverage [email or web address] Password [email or web address]

## 2017-09-03 NOTE — Telephone Encounter (Signed)
See note

## 2017-09-03 NOTE — Telephone Encounter (Signed)
im ok with this. We have a visit scheduled 09/07/16. Do they need a face to face for this and PT orders when I see patient?

## 2017-09-03 NOTE — Telephone Encounter (Signed)
   Reason for Disposition . Cough  Answer Assessment - Initial Assessment Questions 1. ONSET: "When did the cough begin?"      Started last night 2. SEVERITY: "How bad is the cough today?"       Severe 3. RESPIRATORY DISTRESS: "Describe your breathing."      Breathing is OK 4. FEVER: "Do you have a fever?" If so, ask: "What is your temperature, how was it measured, and when did it start?"     nO 5. SPUTUM: "Describe the color of your sputum" (clear, white, yellow, green)     Unsure 6. HEMOPTYSIS: "Are you coughing up any blood?" If so ask: "How much?" (flecks, streaks, tablespoons, etc.)     No 7. CARDIAC HISTORY: "Do you have any history of heart disease?" (e.g., heart attack, congestive heart failure)      Congestive heart failure 8. LUNG HISTORY: "Do you have any history of lung disease?"  (e.g., pulmonary embolus, asthma, emphysema)     No 9. PE RISK FACTORS: "Do you have a history of blood clots?" (or: recent major surgery, recent prolonged travel, bedridden )     No 10. OTHER SYMPTOMS: "Do you have any other symptoms?" (e.g., runny nose, wheezing, chest pain)       Some wheezing - using O2 at home 11. PREGNANCY: "Is there any chance you are pregnant?" "When was your last menstrual period?"       No 12. TRAVEL: "Have you traveled out of the country in the last month?" (e.g., travel history, exposures)       No  Protocols used: Stem  Pt. Just d/c from the hospital this week with bronchitis. Daughter is requesting cough medicine be called in.

## 2017-09-03 NOTE — Telephone Encounter (Signed)
Copied from [place] [id number]. Topic: Quick Communication - See Telephone Encounter >> Sep 03, 2017  1:47 PM Ether Griffins B wrote: CRM for notification. See Telephone encounter for:  Liji with [facility] calling wanting verbal orders for PT  1x 1 week 2x 6 weeks 1x 1 week. Verbal orders for nursing for new O2 use for education and maintenance. Also needing verbal order for home health aid for daily activites 1x 1 week and 2x for 4 weeks. Liji's call back number is [phone].  09/03/17.

## 2017-09-04 ENCOUNTER — Telehealth: Payer: Self-pay | Admitting: Family Medicine

## 2017-09-04 DIAGNOSIS — Z9981 Dependence on supplemental oxygen: Secondary | ICD-10-CM | POA: Diagnosis not present

## 2017-09-04 DIAGNOSIS — I129 Hypertensive chronic kidney disease with stage 1 through stage 4 chronic kidney disease, or unspecified chronic kidney disease: Secondary | ICD-10-CM | POA: Diagnosis not present

## 2017-09-04 DIAGNOSIS — N183 Chronic kidney disease, stage 3 (moderate): Secondary | ICD-10-CM | POA: Diagnosis not present

## 2017-09-04 DIAGNOSIS — J209 Acute bronchitis, unspecified: Secondary | ICD-10-CM | POA: Diagnosis not present

## 2017-09-04 DIAGNOSIS — R1312 Dysphagia, oropharyngeal phase: Secondary | ICD-10-CM | POA: Diagnosis not present

## 2017-09-04 DIAGNOSIS — Z87891 Personal history of nicotine dependence: Secondary | ICD-10-CM | POA: Diagnosis not present

## 2017-09-04 DIAGNOSIS — I5030 Unspecified diastolic (congestive) heart failure: Secondary | ICD-10-CM | POA: Diagnosis not present

## 2017-09-04 DIAGNOSIS — I87322 Chronic venous hypertension (idiopathic) with inflammation of left lower extremity: Secondary | ICD-10-CM | POA: Diagnosis not present

## 2017-09-04 DIAGNOSIS — Z85828 Personal history of other malignant neoplasm of skin: Secondary | ICD-10-CM | POA: Diagnosis not present

## 2017-09-04 DIAGNOSIS — L97321 Non-pressure chronic ulcer of left ankle limited to breakdown of skin: Secondary | ICD-10-CM | POA: Diagnosis not present

## 2017-09-04 MED ORDER — ALBUTEROL SULFATE HFA 108 (90 BASE) MCG/ACT IN AERS: 2.0000 | INHALATION_SPRAY | Freq: Four times a day (QID) | RESPIRATORY_TRACT | 0 refills | Status: DC | PRN

## 2017-09-04 MED ORDER — BENZONATATE 100 MG PO CAPS: 100.0000 mg | ORAL_CAPSULE | Freq: Two times a day (BID) | ORAL | 0 refills | Status: DC | PRN

## 2017-09-04 NOTE — Telephone Encounter (Signed)
I sent in tessalon for patient. Please inform patient.

## 2017-09-04 NOTE — Addendum Note (Signed)
Addended by: [name] on: 09/04/2017 12:57 PM   Modules accepted: Orders

## 2017-09-04 NOTE — Telephone Encounter (Signed)
Tried twice to call back number listed but telephone number is incorrect. Please advise regarding inhaler and nebulizer treatments

## 2017-09-04 NOTE — Telephone Encounter (Signed)
Copied from [place] [phone]. Topic: Quick Communication - See Telephone Encounter >> Sep 04, 2017 12:05 PM Arletha Grippe wrote: CRM for notification. See Telephone encounter for:   09/04/17. [facility] home health called -  Verbal orders for home health  2 week 3, then 1 week 3, then 2 as needed Speech therapy for eval and treat persistent dysphasia and coughing Cb#[phone] secure voicemail. Also requesting pro-air inhaler for wheezing and coughing. 2 times a day  Also consider nebulizer treatments

## 2017-09-04 NOTE — Telephone Encounter (Signed)
Attempted to call Liji back. Left voicemail to return call.

## 2017-09-04 NOTE — Telephone Encounter (Signed)
See note

## 2017-09-05 NOTE — Telephone Encounter (Signed)
I sent in inhaler. im ok with verbal orders

## 2017-09-07 ENCOUNTER — Ambulatory Visit: Payer: Medicare Other | Admitting: Podiatry

## 2017-09-07 DIAGNOSIS — R1312 Dysphagia, oropharyngeal phase: Secondary | ICD-10-CM | POA: Diagnosis not present

## 2017-09-07 DIAGNOSIS — I5030 Unspecified diastolic (congestive) heart failure: Secondary | ICD-10-CM | POA: Diagnosis not present

## 2017-09-07 DIAGNOSIS — I87322 Chronic venous hypertension (idiopathic) with inflammation of left lower extremity: Secondary | ICD-10-CM | POA: Diagnosis not present

## 2017-09-07 DIAGNOSIS — Z9981 Dependence on supplemental oxygen: Secondary | ICD-10-CM | POA: Diagnosis not present

## 2017-09-07 DIAGNOSIS — I129 Hypertensive chronic kidney disease with stage 1 through stage 4 chronic kidney disease, or unspecified chronic kidney disease: Secondary | ICD-10-CM | POA: Diagnosis not present

## 2017-09-07 DIAGNOSIS — J209 Acute bronchitis, unspecified: Secondary | ICD-10-CM | POA: Diagnosis not present

## 2017-09-07 DIAGNOSIS — N183 Chronic kidney disease, stage 3 (moderate): Secondary | ICD-10-CM | POA: Diagnosis not present

## 2017-09-07 DIAGNOSIS — Z87891 Personal history of nicotine dependence: Secondary | ICD-10-CM | POA: Diagnosis not present

## 2017-09-07 DIAGNOSIS — L97321 Non-pressure chronic ulcer of left ankle limited to breakdown of skin: Secondary | ICD-10-CM | POA: Diagnosis not present

## 2017-09-07 DIAGNOSIS — Z85828 Personal history of other malignant neoplasm of skin: Secondary | ICD-10-CM | POA: Diagnosis not present

## 2017-09-08 DIAGNOSIS — N183 Chronic kidney disease, stage 3 (moderate): Secondary | ICD-10-CM | POA: Diagnosis not present

## 2017-09-08 DIAGNOSIS — Z9981 Dependence on supplemental oxygen: Secondary | ICD-10-CM | POA: Diagnosis not present

## 2017-09-08 DIAGNOSIS — L97321 Non-pressure chronic ulcer of left ankle limited to breakdown of skin: Secondary | ICD-10-CM | POA: Diagnosis not present

## 2017-09-08 DIAGNOSIS — Z87891 Personal history of nicotine dependence: Secondary | ICD-10-CM | POA: Diagnosis not present

## 2017-09-08 DIAGNOSIS — I129 Hypertensive chronic kidney disease with stage 1 through stage 4 chronic kidney disease, or unspecified chronic kidney disease: Secondary | ICD-10-CM | POA: Diagnosis not present

## 2017-09-08 DIAGNOSIS — J209 Acute bronchitis, unspecified: Secondary | ICD-10-CM | POA: Diagnosis not present

## 2017-09-08 DIAGNOSIS — I87322 Chronic venous hypertension (idiopathic) with inflammation of left lower extremity: Secondary | ICD-10-CM | POA: Diagnosis not present

## 2017-09-08 DIAGNOSIS — I5030 Unspecified diastolic (congestive) heart failure: Secondary | ICD-10-CM | POA: Diagnosis not present

## 2017-09-08 DIAGNOSIS — R1312 Dysphagia, oropharyngeal phase: Secondary | ICD-10-CM | POA: Diagnosis not present

## 2017-09-08 DIAGNOSIS — Z85828 Personal history of other malignant neoplasm of skin: Secondary | ICD-10-CM | POA: Diagnosis not present

## 2017-09-09 ENCOUNTER — Ambulatory Visit (INDEPENDENT_AMBULATORY_CARE_PROVIDER_SITE_OTHER): Payer: Medicare Other | Admitting: Family Medicine

## 2017-09-09 ENCOUNTER — Encounter: Payer: Self-pay | Admitting: Family Medicine

## 2017-09-09 VITALS — BP 104/58 | HR 75 | Temp 98.0°F

## 2017-09-09 DIAGNOSIS — J40 Bronchitis, not specified as acute or chronic: Secondary | ICD-10-CM

## 2017-09-09 DIAGNOSIS — J690 Pneumonitis due to inhalation of food and vomit: Secondary | ICD-10-CM

## 2017-09-09 DIAGNOSIS — N183 Chronic kidney disease, stage 3 unspecified: Secondary | ICD-10-CM

## 2017-09-09 DIAGNOSIS — I5030 Unspecified diastolic (congestive) heart failure: Secondary | ICD-10-CM | POA: Diagnosis not present

## 2017-09-09 DIAGNOSIS — I10 Essential (primary) hypertension: Secondary | ICD-10-CM | POA: Diagnosis not present

## 2017-09-09 LAB — COMPREHENSIVE METABOLIC PANEL
ALBUMIN: 3.6 g/dL (ref 3.5–5.2)
ALK PHOS: 55 U/L (ref 39–117)
ALT: 12 U/L (ref 0–53)
AST: 18 U/L (ref 0–37)
BILIRUBIN TOTAL: 0.4 mg/dL (ref 0.2–1.2)
BUN: 19 mg/dL (ref 6–23)
CALCIUM: 8.9 mg/dL (ref 8.4–10.5)
CO2: 27 mEq/L (ref 19–32)
Chloride: 103 mEq/L (ref 96–112)
Creatinine, Ser: 1.22 mg/dL (ref 0.40–1.50)
GFR: 58.54 mL/min — ABNORMAL LOW (ref >60.00)
GLUCOSE: 78 mg/dL (ref 70–99)
POTASSIUM: 4.4 meq/L (ref 3.5–5.1)
Sodium: 137 mEq/L (ref 135–145)
TOTAL PROTEIN: 7.1 g/dL (ref 6.0–8.3)

## 2017-09-09 LAB — CBC
HEMATOCRIT: 34.1 % — AB (ref 39.0–52.0)
HEMOGLOBIN: 11.1 g/dL — AB (ref 13.0–17.0)
MCHC: 32.6 g/dL (ref 30.0–36.0)
MCV: 96.3 fl (ref 78.0–100.0)
PLATELETS: 290 10*3/uL (ref 150.0–400.0)
RBC: 3.54 Mil/uL — AB (ref 4.22–5.81)
RDW: 13.6 % (ref 11.5–15.5)
WBC: 12.4 10*3/uL — ABNORMAL HIGH (ref 4.0–10.5)

## 2017-09-09 NOTE — Progress Notes (Signed)
Subjective:  Roger [name] is a 84 y.o. year old very pleasant male patient who presents for transitional care management and hospital follow up for bronchitis and aspiration pneumonia. Patient was hospitalized from 08/29/16 to 09/01/17. A TCM phone call was completed on 09/02/17. Medical complexity moderate   Roger [name] is a 84 year old gentleman with a medical history of chronic diastolic heart failure, hypertension, chronic kidney disease stage III, hyperlipidemia, recurrent cough who presented to the emergency room with worsening shortness of breath, cough, chest congestion over days.  And was ultimately diagnosed with bronchitis as well as acute respiratory failure requiring oxygen.  In the emergency room he was noted to have difficulty breathing and required 2-3 L of oxygen to maintain saturations overnight.  He was also noted to be wheezing diffusely.  His flu test was negative.  His initial chest x-ray was negative for pneumonia.  He was admitted under bronchitis and bronchospasm with acute respiratory failure/hypoxemia.   While admitted, he was treated with steroids, duo nebs, azithromycin for the bronchitis.  He had to be maintained on oxygen throughout the hospitalization and actually needed 4 L of oxygen before discharge.  He was not improving as rapidly as hoped and a CT of his chest was ordered which showed pneumonia bilaterally, possibly caused by aspiration.  Patient was started on Rocephin and eventually changed to Augmentin-was treated with this 1 week post discharge-finished last dose yesterday.  He also had a speech pathology evaluation.  He had a swallow study and his diet was adjusted.  It was thought that aspiration could be the cause of his pneumonia.  His other chronic medical conditions were largely stable during the hospitalization.  His chronic diastolic heart failure was maintained with Lasix 20 mg daily.  He has chronic kidney disease stage III and did have acute kidney injury on  top of this-This resolved with hydration.  He has hypertension but his blood pressure was running low so his arb was held-last visit here he was on irbesartan but they listed stopping losartan.  Patient was sent home with home health.  He has home health, RN, home health aide helping at home.  The hospitalist recommended a CBC and BMP on hospital follow-up as well as follow-up CT scan of the chest in 6 weeks.  See CT results below.  Daughter and patient reports that he is slowly improving.  He is still requiring 4 L of oxygen for the most part.  Daughter is struggling to get her father to be compliant though-she came down stairs this morning and his oxygen was off-oxygen saturations were in the 80s and he seemed agitated.  This improved getting him back on the oxygen.  He has also not been compliant with his incentive spirometer.   I independently reviewed the chest x-ray from January 5-lungs were definitely low volume.  There are some coarse lung markings along the bronchi concerning for potential bronchitis.   opacity bilateral bases potentially atelectasis.  Stable prior compression fracture of L1.  Also independently reviewed the chest CT from January 7 and noted bibasilar opacities-left does seem to be larger than the right.  Narrowing of left lower lobe bronchus and area as pointed out by radiology.  Aortic atherosclerosis was noted.  Gallstones noted.   See problem oriented charting as well ROS-continue to shortness of breath.  No chest pain.  Stable edema.  No longer coughing with eating when eating small bites and drinking water afterwards.   Past Medical History-  Patient  Active Problem List   Diagnosis Date Noted  . Skin ulcer of left ankle, limited to breakdown of skin ([place]) 10/16/2016    Priority: High  . Diastolic CHF ([place]) 06/21/2535    Priority: High  . Allergic conjunctivitis of both eyes 06/22/2017    Priority: Medium  . Depression 07/05/2015    Priority: Medium  . History of  pneumonia 03/10/2015    Priority: Medium  . Chronic throat clearing 10/11/2014    Priority: Medium  . CKD (chronic kidney disease), stage III ([place]) 07/13/2014    Priority: Medium  . Overactive bladder 07/13/2014    Priority: Medium  . Syncope 11/17/2013    Priority: Medium  . Hypertension 06/17/2007    Priority: Medium  . Hyperlipidemia 02/22/2007    Priority: Medium  . Venous (peripheral) insufficiency 01/26/2015    Priority: Low  . Macular degeneration 06/19/2011    Priority: Low  . Osteoarthritis 06/05/2009    Priority: Low  . CARCINOMA, SKIN, SQUAMOUS CELL 05/26/2008    Priority: Low  . GERD (gastroesophageal reflux disease) 02/22/2007    Priority: Low  . Bronchitis 08/29/2017  . Idiopathic chronic venous hypertension of left lower extremity with ulcer and inflammation ([place]) 10/22/2016  . Onychomycosis 10/22/2016    Medications- reviewed and updated  A medical reconciliation was performed comparing current medicines to hospital discharge medications. Current Outpatient Medications  Medication Sig Dispense Refill  . albuterol (PROVENTIL HFA;VENTOLIN HFA) 108 (90 Base) MCG/ACT inhaler Inhale 2 puffs into the lungs every 6 (six) hours as needed for wheezing or shortness of breath. 1 Inhaler 0  . aspirin (ECOTRIN) 325 MG EC tablet Take 325 mg by mouth daily.    . benzonatate (TESSALON) 100 MG capsule Take 1 capsule (100 mg total) by mouth 2 (two) times daily as needed for cough. 20 capsule 0  . fluticasone (FLONASE) 50 MCG/ACT nasal spray Place 2 sprays into both nostrils daily. 16 g 6  . furosemide (LASIX) 20 MG tablet Take 1 tablet (20 mg total) daily by mouth. 90 tablet 1  . guaiFENesin (MUCINEX) 600 MG 12 hr tablet Take 2 tablets (1,200 mg total) by mouth 2 (two) times daily. 60 tablet 0  . lovastatin (MEVACOR) 40 MG tablet Take 1 tablet (40 mg total) by mouth daily. 90 tablet 3  . Multiple Vitamins-Minerals (MULTIVITAMIN WITH MINERALS) tablet Take 1 tablet by mouth daily.     [name] omeprazole (PRILOSEC) 20 MG capsule TAKE ONE CAPSULE BY MOUTH TWICE A DAY BEFORE A MEAL 180 capsule 2  . oxybutynin (DITROPAN) 5 MG tablet Take 0.5 tablets (2.5 mg total) by mouth daily. 45 tablet 3  . UNABLE TO FIND Med Name: Med pass 120 mL by mouth twice daily for supplement     No current facility-administered medications for this visit.     Objective: BP (!) 104/58 (BP Location: Left Arm, Patient Position: Sitting, Cuff Size: Large)   Pulse 75   Temp 98 F (36.7 C) (Oral)   SpO2 91% Comment: O2 4lpm via [place] Gen: NAD, resting comfortably CV: RRR no murmurs rubs or gallops Lungs: diffuse wheeze. No obvious crackles. Some rales Abdomen: soft/nontender/nondistended/normal bowel sounds. No rebound or guarding.  Ext: stable 1+ edema Skin: warm, dry Neuro: uses wheelchair today as usual  Dg Chest 2 View  Result Date: 08/29/2017 CLINICAL DATA:  Cough and weakness for 2 days. EXAM: CHEST  2 VIEW COMPARISON:  03/09/2015 and prior radiographs FINDINGS: Upper limits normal heart size noted. This is a low  volume film with bibasilar atelectasis. No definite airspace disease, pleural effusion or pneumothorax. An L1 compression fracture is unchanged. IMPRESSION: Low volume film with bibasilar atelectasis. Electronically Signed   By: Margarette Canada M.D.   On: 08/29/2017 15:42   Ct Chest High Resolution  Result Date: 08/31/2017 CLINICAL DATA:  Worsening shortness of breath, cough and congestion. Wheezing, decreased O2 sats. EXAM: CT CHEST WITHOUT CONTRAST TECHNIQUE: Multidetector CT imaging of the chest was performed following the standard protocol without intravenous contrast. High resolution imaging of the lungs, as well as inspiratory and expiratory imaging, was performed. COMPARISON:  Chest radiograph 08/29/2017 and 03/09/2015. FINDINGS: Cardiovascular: Atherosclerotic calcification of the arterial vasculature, including moderate to severe involvement of the coronary arteries. Heart is enlarged. No  pericardial effusion. Mediastinum/Nodes: Mediastinal lymph nodes are not enlarged by CT size criteria. Hilar regions are difficult to evaluate without IV contrast. No axillary adenopathy. Esophagus is grossly unremarkable. Lungs/Pleura: Image quality is degraded by respiratory motion. Collapse/ consolidation both lower lobes, left greater than right. Chronic appearing volume loss adjacent to an elevated left hemidiaphragm. Mild centrilobular emphysema. No pleural fluid. Debris and slight narrowing are seen involving the left lower lobe bronchus. Airway is otherwise unremarkable. Upper Abdomen: Multiple low-attenuation lesions are seen in the liver, measuring up to 2.6 cm. Definitive characterization is limited without post-contrast imaging but cysts are likely. Granular stone debris in the gallbladder. Adrenal glands and right kidney are unremarkable. There may be punctate stones in the left kidney. Visualized portions of the spleen, pancreas, stomach and bowel are grossly unremarkable. No upper abdominal adenopathy. Musculoskeletal: Degenerative changes and scoliosis in the spine. No worrisome lytic or sclerotic lesions. Degenerate changes in the shoulders. Lower thoracic compression deformity, chronic. IMPRESSION: 1. Bilateral lower lobe collapse/consolidation, left greater than right. Aspiration and/or pneumonia can have this appearance. 2. Apparent narrowing of the left lower lobe bronchus may be due to expiration and aspirated debris. Difficult to exclude a centrally obstructing lesion. 3. Aortic atherosclerosis (ICD10-170.0). Moderate to severe coronary artery calcification. 4.  Emphysema (ICD10-J43.9). 5. Cholelithiasis. 6. Question punctate left renal stones. Electronically Signed   By: Lorin Picket M.D.   On: 08/31/2017 16:11   Dg Swallowing Func-speech Pathology  Result Date: 09/01/2017 Objective Swallowing Evaluation: Type of Study: Bedside Swallow Evaluation  Patient Details Name: SHRIYAN ARAKAWA  MRN: [id number] Date of Birth: 1919/12/17 Today's Date: 09/01/2017 Time: SLP Start Time (ACUTE ONLY): 1226 -SLP Stop Time (ACUTE ONLY): 1300 SLP Time Calculation (min) (ACUTE ONLY): 34 min Past Medical History: Past Medical History: Diagnosis Date . GERD 02/22/2007 . HYPERLIPIDEMIA 02/22/2007 . HYPERTENSION 06/17/2007 . MACULAR DEGENERATION 02/22/2007 . OSTEOARTHRITIS, HIP, RIGHT 06/05/2009 Past Surgical History: Past Surgical History: Procedure Laterality Date . LAMINECTOMY  1988 HPI: 84 yo male adm [facility]29[facility]16 with bronchitis.  PMH + for GERD, macular degeneration, hearing loss, CHF, depression, OEA, CKD.  CT chest showed emphysema, ? aspiration or pna - ? aspirated debris in left lower airways due to narrowing, bilateral lower lobe collapse/consolidation.   Subjective: pt awake in chair Assessment / Plan / Recommendation CHL IP CLINICAL IMPRESSIONS 09/01/2017 Clinical Impression Pt presents with significant pharyngeal dysphagia characterized by decreased epiglottic deflection/laryngeal elevation resulting in poor UES opening and subsequent gross residuals in pharynx which pt does NOT sense.  When pt attempted to swallow cracker bolus, it was fully retained in pharynx!  Multiple liquid swallows decreased solid residuals and dry swallows decrease residuals of liquids.  Chin tuck posture ineffective as it dumps pyriform sinus  residuals into pyriform sinus.  Dry swallows ineffective to decrease solid residual. Pt with trace laryngeal penetration of thin liquids but largest issue is gross residuals.  Suspect pt likely has low grade chronic aspiration and strict precautions recommended for him.  Swallow function is worse than during prior MBS.  Using teach back, pt educated to findings/recommendations.  Pt poignantly stated- "I'm 89, I don't have much longer to go".   SLP Visit Diagnosis Dysphagia, pharyngeal phase (R13.13);Dysphagia, pharyngoesophageal phase (R13.14) Attention and concentration deficit following -- Frontal  lobe and executive function deficit following -- Impact on safety and function Moderate aspiration risk;Risk for inadequate nutrition/hydration   CHL IP TREATMENT RECOMMENDATION 09/01/2017 Treatment Recommendations No treatment recommended at this time   Prognosis 03/10/2015 Prognosis for Safe Diet Advancement Good Barriers to Reach Goals -- Barriers/Prognosis Comment -- CHL IP DIET RECOMMENDATION 09/01/2017 SLP Diet Recommendations Dysphagia 3 (Mech soft) solids;Thin liquid Liquid Administration via Cup Medication Administration Crushed with puree Compensations Slow rate;Small sips/bites;Multiple dry swallows after each bite/sip;Follow solids with liquid Postural Changes Remain semi-upright after after feeds/meals (Comment);Seated upright at 90 degrees   CHL IP OTHER RECOMMENDATIONS 09/01/2017 Recommended Consults -- Oral Care Recommendations Oral care QID Other Recommendations --   CHL IP FOLLOW UP RECOMMENDATIONS 09/01/2017 Follow up Recommendations None   CHL IP FREQUENCY AND DURATION 03/10/2015 Speech Therapy Frequency (ACUTE ONLY) min 1 x/week Treatment Duration 1 week      CHL IP ORAL PHASE 09/01/2017 Oral Phase WFL Oral - Pudding Teaspoon -- Oral - Pudding Cup -- Oral - Honey Teaspoon -- Oral - Honey Cup -- Oral - Nectar Teaspoon -- Oral - Nectar Cup -- Oral - Nectar Straw -- Oral - Thin Teaspoon -- Oral - Thin Cup -- Oral - Thin Straw -- Oral - Puree -- Oral - Mech Soft -- Oral - Regular -- Oral - Multi-Consistency -- Oral - Pill -- Oral Phase - Comment --  CHL IP PHARYNGEAL PHASE 09/01/2017 Pharyngeal Phase Impaired Pharyngeal- Pudding Teaspoon -- Pharyngeal -- Pharyngeal- Pudding Cup -- Pharyngeal -- Pharyngeal- Honey Teaspoon -- Pharyngeal -- Pharyngeal- Honey Cup -- Pharyngeal -- Pharyngeal- Nectar Teaspoon -- Pharyngeal -- Pharyngeal- Nectar Cup Reduced pharyngeal peristalsis;Reduced epiglottic inversion;Reduced anterior laryngeal mobility;Reduced laryngeal elevation;Reduced airway/laryngeal closure;Reduced tongue  base retraction;Pharyngeal residue - valleculae;Pharyngeal residue - pyriform;Pharyngeal residue - cp segment;Pharyngeal residue - posterior pharnyx Pharyngeal -- Pharyngeal- Nectar Straw -- Pharyngeal -- Pharyngeal- Thin Teaspoon Reduced pharyngeal peristalsis;Reduced epiglottic inversion;Reduced anterior laryngeal mobility;Reduced laryngeal elevation;Reduced airway/laryngeal closure;Pharyngeal residue - valleculae;Pharyngeal residue - pyriform;Pharyngeal residue - posterior pharnyx;Reduced tongue base retraction;Pharyngeal residue - cp segment Pharyngeal -- Pharyngeal- Thin Cup Reduced pharyngeal peristalsis;Reduced epiglottic inversion;Reduced anterior laryngeal mobility;Reduced laryngeal elevation;Reduced airway/laryngeal closure;Reduced tongue base retraction;Penetration/Aspiration during swallow;Pharyngeal residue - cp segment;Pharyngeal residue - pyriform;Pharyngeal residue - valleculae Pharyngeal Material enters airway, remains ABOVE vocal cords and not ejected out Pharyngeal- Thin Straw Reduced airway/laryngeal closure;Reduced tongue base retraction;Penetration/Aspiration before swallow;Reduced epiglottic inversion;Reduced anterior laryngeal mobility;Reduced laryngeal elevation;Pharyngeal residue - valleculae;Pharyngeal residue - pyriform;Pharyngeal residue - posterior pharnyx;Pharyngeal residue - cp segment Pharyngeal Material does not enter airway Pharyngeal- Puree Reduced pharyngeal peristalsis;Reduced epiglottic inversion;Reduced anterior laryngeal mobility;Reduced airway/laryngeal closure;Reduced laryngeal elevation;Reduced tongue base retraction;Pharyngeal residue - valleculae;Pharyngeal residue - pyriform;Pharyngeal residue - posterior pharnyx;Pharyngeal residue - cp segment Pharyngeal -- Pharyngeal- Mechanical Soft Reduced epiglottic inversion;Reduced anterior laryngeal mobility;Reduced laryngeal elevation;Reduced pharyngeal peristalsis;Reduced airway/laryngeal closure;Pharyngeal residue -  valleculae;Pharyngeal residue - pyriform;Pharyngeal residue - posterior pharnyx;Pharyngeal residue - cp segment;Reduced tongue base retraction Pharyngeal -- Pharyngeal- Regular -- Pharyngeal -- Pharyngeal- Multi-consistency -- Pharyngeal --  Pharyngeal- Pill -- Pharyngeal -- Pharyngeal Comment chin tuck did not help swallow  CHL IP CERVICAL ESOPHAGEAL PHASE 09/01/2017 Cervical Esophageal Phase Impaired Pudding Teaspoon -- Pudding Cup -- Honey Teaspoon -- Honey Cup -- Nectar Teaspoon -- Nectar Cup -- Nectar Straw -- Thin Teaspoon -- Thin Cup -- Thin Straw -- Puree -- Mechanical Soft -- Regular -- Multi-consistency -- Pill -- Cervical Esophageal Comment decreased upper esophageal opening due to decreased laryngeal elevation CHL IP GO 11/18/2013 Functional Assessment Tool Used ASHA NOMS Functional Limitations Swallowing Swallow Current Status (J8563) CI Swallow Goal Status (J4970) CI Swallow Discharge Status (Y6378) CI Luanna Salk, MS [facility] SLP [phone]                Assessment/Plan:   Bronchitis Bronchitis appears to be improving some.  Complicated by likely aspiration pneumonia as well.  He completed initial course of azithromycin and prednisone.  He is also completed a course of Augmentin.  Discussed with daughter that the initial bronchitis could be viral in nature and may last 3-6 weeks-thus reason for continuing oxygen.  Advised 2-3-week follow-up to see how he is doing and if he is making progress.  Strongly encouraged use of oxygen at all times as well as incentive spirometer every hour.  With continued wheezing and poor lung volumes, I think he may benefit more from a nebulizer than the albuterol inhaler I sent in.  Roselyn Reef will work to set him up with nebulizer at home through either [facility] who is bringing his oxygen or [facility] who is providing his other home health services including PT, nursing, home health aide.  Likely use albuterol every 6-8 hours initially and then wean  down.  With his respiratory failure on continued oxyge-I encouraged him to remain on this at all times and we will reevaluate at next visit.  -Repeat CT in 6 weeks as suggested by hospitalist.  Diastolic CHF ([place]) Appears euvolemic.  Continue Lasix 20 mg daily.  Edema stable.  CKD (chronic kidney disease), stage III ([place]) Update creatinine/GFR today.  Had acute kidney injury in the hospital initially which later improved with hydration  Hypertension Controlled on Lasix alone.  He previously had required angiotensin receptor blocker as well.  He is now off of both the losartan (may have contributed to cough) and irbesartan-recheck at follow-up.   Future Appointments  Date Time Provider [place]  09/21/2017 12:30 PM Newt Minion, MD [facility] None  09/28/2017  1:45 PM [name], MD [facility]  10/01/2017  3:30 PM [name], MD [facility]  10/12/2017  1:00 PM [facility] 1 [facility]  will have front office call to clarify as should only need 1 visit early february  Lab/Order associations: Bronchitis - Plan: DME Nebulizer/meds, CBC, Comprehensive metabolic panel, CT Chest High Resolution  Aspiration pneumonia of both lower lobes, unspecified aspiration pneumonia type ([place]) - Plan: CBC, Comprehensive metabolic panel, CT Chest High Resolution  Return precautions advised.  Garret Reddish, MD

## 2017-09-09 NOTE — Assessment & Plan Note (Addendum)
Bronchitis appears to be improving some.  Complicated by likely aspiration pneumonia as well.  He completed initial course of azithromycin and prednisone.  He is also completed a course of Augmentin.  Discussed with daughter that the initial bronchitis could be viral in nature and may last 3-6 weeks-thus reason for continuing oxygen.  Advised 2-3-week follow-up to see how he is doing and if he is making progress.  Strongly encouraged use of oxygen at all times as well as incentive spirometer every hour.  With continued wheezing and poor lung volumes, I think he may benefit more from a nebulizer than the albuterol inhaler I sent in.  Roselyn Reef will work to set him up with nebulizer at home through either [facility] who is bringing his oxygen or [facility] who is providing his other home health services including PT, nursing, home health aide.  Likely use albuterol every 6-8 hours initially and then wean down.  With his respiratory failure on continued oxyge-I encouraged him to remain on this at all times and we will reevaluate at next visit.  -Repeat CT in 6 weeks as suggested by hospitalist.

## 2017-09-09 NOTE — Assessment & Plan Note (Signed)
Update creatinine/GFR today.  Had acute kidney injury in the hospital initially which later improved with hydration

## 2017-09-09 NOTE — Assessment & Plan Note (Signed)
Controlled on Lasix alone.  He previously had required angiotensin receptor blocker as well.  He is now off of both the losartan (may have contributed to cough) and irbesartan-recheck at follow-up.

## 2017-09-09 NOTE — Assessment & Plan Note (Signed)
Appears euvolemic.  Continue Lasix 20 mg daily.  Edema stable.

## 2017-09-09 NOTE — Patient Instructions (Signed)
Roger [name] will try to get neubulizer set up  Please stop by lab before you go  I ordered a CT for February- you should hear about this within 2 weeks or so, call us if not.

## 2017-09-10 DIAGNOSIS — I5030 Unspecified diastolic (congestive) heart failure: Secondary | ICD-10-CM | POA: Diagnosis not present

## 2017-09-10 DIAGNOSIS — I129 Hypertensive chronic kidney disease with stage 1 through stage 4 chronic kidney disease, or unspecified chronic kidney disease: Secondary | ICD-10-CM | POA: Diagnosis not present

## 2017-09-10 DIAGNOSIS — N183 Chronic kidney disease, stage 3 (moderate): Secondary | ICD-10-CM | POA: Diagnosis not present

## 2017-09-10 DIAGNOSIS — I87322 Chronic venous hypertension (idiopathic) with inflammation of left lower extremity: Secondary | ICD-10-CM | POA: Diagnosis not present

## 2017-09-10 DIAGNOSIS — R1312 Dysphagia, oropharyngeal phase: Secondary | ICD-10-CM | POA: Diagnosis not present

## 2017-09-10 DIAGNOSIS — L97321 Non-pressure chronic ulcer of left ankle limited to breakdown of skin: Secondary | ICD-10-CM | POA: Diagnosis not present

## 2017-09-10 DIAGNOSIS — J209 Acute bronchitis, unspecified: Secondary | ICD-10-CM | POA: Diagnosis not present

## 2017-09-10 DIAGNOSIS — Z9981 Dependence on supplemental oxygen: Secondary | ICD-10-CM | POA: Diagnosis not present

## 2017-09-10 DIAGNOSIS — Z85828 Personal history of other malignant neoplasm of skin: Secondary | ICD-10-CM | POA: Diagnosis not present

## 2017-09-10 DIAGNOSIS — Z87891 Personal history of nicotine dependence: Secondary | ICD-10-CM | POA: Diagnosis not present

## 2017-09-10 NOTE — Telephone Encounter (Signed)
Patient was seen in office 09/09/17

## 2017-09-10 NOTE — Telephone Encounter (Signed)
Patient was seen in office yesterday 09/09/17

## 2017-09-11 ENCOUNTER — Telehealth: Payer: Self-pay | Admitting: Family Medicine

## 2017-09-11 DIAGNOSIS — Z87891 Personal history of nicotine dependence: Secondary | ICD-10-CM | POA: Diagnosis not present

## 2017-09-11 DIAGNOSIS — Z9981 Dependence on supplemental oxygen: Secondary | ICD-10-CM | POA: Diagnosis not present

## 2017-09-11 DIAGNOSIS — N183 Chronic kidney disease, stage 3 (moderate): Secondary | ICD-10-CM | POA: Diagnosis not present

## 2017-09-11 DIAGNOSIS — R1312 Dysphagia, oropharyngeal phase: Secondary | ICD-10-CM | POA: Diagnosis not present

## 2017-09-11 DIAGNOSIS — I87322 Chronic venous hypertension (idiopathic) with inflammation of left lower extremity: Secondary | ICD-10-CM | POA: Diagnosis not present

## 2017-09-11 DIAGNOSIS — I5030 Unspecified diastolic (congestive) heart failure: Secondary | ICD-10-CM | POA: Diagnosis not present

## 2017-09-11 DIAGNOSIS — J209 Acute bronchitis, unspecified: Secondary | ICD-10-CM | POA: Diagnosis not present

## 2017-09-11 DIAGNOSIS — L97321 Non-pressure chronic ulcer of left ankle limited to breakdown of skin: Secondary | ICD-10-CM | POA: Diagnosis not present

## 2017-09-11 DIAGNOSIS — I129 Hypertensive chronic kidney disease with stage 1 through stage 4 chronic kidney disease, or unspecified chronic kidney disease: Secondary | ICD-10-CM | POA: Diagnosis not present

## 2017-09-11 DIAGNOSIS — Z85828 Personal history of other malignant neoplasm of skin: Secondary | ICD-10-CM | POA: Diagnosis not present

## 2017-09-11 NOTE — Telephone Encounter (Signed)
Copied from [place] [phone]. Topic: General - Other >> Sep 11, 2017  1:44 PM Scherrie Gerlach wrote: Reason for CRM: d aughter called to request a fax be sent to [facility] for the nebulizer machine. When she spoke to [facility], they told her they thought Rx was only for solution. Pt needs to have the machine delivered. [facility] requesting Dr Yong Channel fax this request to [phone] and put delivery on the Rx.  Pt is aware there may be a charge for delivery.

## 2017-09-14 DIAGNOSIS — I87322 Chronic venous hypertension (idiopathic) with inflammation of left lower extremity: Secondary | ICD-10-CM | POA: Diagnosis not present

## 2017-09-14 DIAGNOSIS — Z87891 Personal history of nicotine dependence: Secondary | ICD-10-CM | POA: Diagnosis not present

## 2017-09-14 DIAGNOSIS — N183 Chronic kidney disease, stage 3 (moderate): Secondary | ICD-10-CM | POA: Diagnosis not present

## 2017-09-14 DIAGNOSIS — Z9981 Dependence on supplemental oxygen: Secondary | ICD-10-CM | POA: Diagnosis not present

## 2017-09-14 DIAGNOSIS — I5030 Unspecified diastolic (congestive) heart failure: Secondary | ICD-10-CM | POA: Diagnosis not present

## 2017-09-14 DIAGNOSIS — R1312 Dysphagia, oropharyngeal phase: Secondary | ICD-10-CM | POA: Diagnosis not present

## 2017-09-14 DIAGNOSIS — L97321 Non-pressure chronic ulcer of left ankle limited to breakdown of skin: Secondary | ICD-10-CM | POA: Diagnosis not present

## 2017-09-14 DIAGNOSIS — Z85828 Personal history of other malignant neoplasm of skin: Secondary | ICD-10-CM | POA: Diagnosis not present

## 2017-09-14 DIAGNOSIS — I129 Hypertensive chronic kidney disease with stage 1 through stage 4 chronic kidney disease, or unspecified chronic kidney disease: Secondary | ICD-10-CM | POA: Diagnosis not present

## 2017-09-14 DIAGNOSIS — J209 Acute bronchitis, unspecified: Secondary | ICD-10-CM | POA: Diagnosis not present

## 2017-09-15 ENCOUNTER — Other Ambulatory Visit: Payer: Self-pay

## 2017-09-15 DIAGNOSIS — N183 Chronic kidney disease, stage 3 (moderate): Secondary | ICD-10-CM | POA: Diagnosis not present

## 2017-09-15 DIAGNOSIS — J40 Bronchitis, not specified as acute or chronic: Secondary | ICD-10-CM | POA: Diagnosis not present

## 2017-09-15 DIAGNOSIS — Z85828 Personal history of other malignant neoplasm of skin: Secondary | ICD-10-CM | POA: Diagnosis not present

## 2017-09-15 DIAGNOSIS — R269 Unspecified abnormalities of gait and mobility: Secondary | ICD-10-CM | POA: Diagnosis not present

## 2017-09-15 DIAGNOSIS — I5040 Unspecified combined systolic (congestive) and diastolic (congestive) heart failure: Secondary | ICD-10-CM | POA: Diagnosis not present

## 2017-09-15 DIAGNOSIS — J209 Acute bronchitis, unspecified: Secondary | ICD-10-CM | POA: Diagnosis not present

## 2017-09-15 DIAGNOSIS — R1312 Dysphagia, oropharyngeal phase: Secondary | ICD-10-CM | POA: Diagnosis not present

## 2017-09-15 DIAGNOSIS — I87322 Chronic venous hypertension (idiopathic) with inflammation of left lower extremity: Secondary | ICD-10-CM | POA: Diagnosis not present

## 2017-09-15 DIAGNOSIS — Z9981 Dependence on supplemental oxygen: Secondary | ICD-10-CM | POA: Diagnosis not present

## 2017-09-15 DIAGNOSIS — M6281 Muscle weakness (generalized): Secondary | ICD-10-CM | POA: Diagnosis not present

## 2017-09-15 DIAGNOSIS — I129 Hypertensive chronic kidney disease with stage 1 through stage 4 chronic kidney disease, or unspecified chronic kidney disease: Secondary | ICD-10-CM | POA: Diagnosis not present

## 2017-09-15 DIAGNOSIS — J42 Unspecified chronic bronchitis: Secondary | ICD-10-CM | POA: Diagnosis not present

## 2017-09-15 DIAGNOSIS — L97321 Non-pressure chronic ulcer of left ankle limited to breakdown of skin: Secondary | ICD-10-CM | POA: Diagnosis not present

## 2017-09-15 DIAGNOSIS — I5030 Unspecified diastolic (congestive) heart failure: Secondary | ICD-10-CM | POA: Diagnosis not present

## 2017-09-15 DIAGNOSIS — Z87891 Personal history of nicotine dependence: Secondary | ICD-10-CM | POA: Diagnosis not present

## 2017-09-15 MED ORDER — ALBUTEROL SULFATE (2.5 MG/3ML) 0.083% IN NEBU: 2.5000 mg | INHALATION_SOLUTION | Freq: Four times a day (QID) | RESPIRATORY_TRACT | 1 refills | Status: DC | PRN

## 2017-09-15 NOTE — Telephone Encounter (Signed)
Patient's daughter [name] calling back again because the paper script she was given was not accepted by [facility] for Nebulizer Solution. Please contact pharm or send e-script, they have the machine but no formula/solution.

## 2017-09-15 NOTE — Telephone Encounter (Signed)
Please advise on the two notes below.  Copied from [place]. Topic: General - Other >> Sep 15, 2017  1:00 PM Roger [name] wrote: Reason for CRM: Daughter [name] needs to know if patient still needs to take blood pressure medicine? Please have CMA call her back to discuss.

## 2017-09-16 NOTE — Telephone Encounter (Signed)
The only blood pressure medicine I see he is on is the lasix. Has his blood pressure been running low or high- just wondering why this question is coming up.   Also- do they have everything they need for the nebulizers?

## 2017-09-17 DIAGNOSIS — I129 Hypertensive chronic kidney disease with stage 1 through stage 4 chronic kidney disease, or unspecified chronic kidney disease: Secondary | ICD-10-CM | POA: Diagnosis not present

## 2017-09-17 DIAGNOSIS — I5030 Unspecified diastolic (congestive) heart failure: Secondary | ICD-10-CM | POA: Diagnosis not present

## 2017-09-17 DIAGNOSIS — L97321 Non-pressure chronic ulcer of left ankle limited to breakdown of skin: Secondary | ICD-10-CM | POA: Diagnosis not present

## 2017-09-17 DIAGNOSIS — R1312 Dysphagia, oropharyngeal phase: Secondary | ICD-10-CM | POA: Diagnosis not present

## 2017-09-17 DIAGNOSIS — I87322 Chronic venous hypertension (idiopathic) with inflammation of left lower extremity: Secondary | ICD-10-CM | POA: Diagnosis not present

## 2017-09-17 DIAGNOSIS — J209 Acute bronchitis, unspecified: Secondary | ICD-10-CM | POA: Diagnosis not present

## 2017-09-17 DIAGNOSIS — Z87891 Personal history of nicotine dependence: Secondary | ICD-10-CM | POA: Diagnosis not present

## 2017-09-17 DIAGNOSIS — Z85828 Personal history of other malignant neoplasm of skin: Secondary | ICD-10-CM | POA: Diagnosis not present

## 2017-09-17 DIAGNOSIS — N183 Chronic kidney disease, stage 3 (moderate): Secondary | ICD-10-CM | POA: Diagnosis not present

## 2017-09-17 DIAGNOSIS — Z9981 Dependence on supplemental oxygen: Secondary | ICD-10-CM | POA: Diagnosis not present

## 2017-09-17 NOTE — Telephone Encounter (Signed)
Called and spoke with [name] who states her dad's blood pressure has been running on the low side of normal. They have everything and they are doing the nebulizer's as ordered.

## 2017-09-17 NOTE — Telephone Encounter (Signed)
I worry about stopping the lasix with his heart failure- unless he is feeling poorly or blood pressure gets really low I want to continue the current dose

## 2017-09-18 ENCOUNTER — Telehealth: Payer: Self-pay | Admitting: Family Medicine

## 2017-09-18 DIAGNOSIS — Z85828 Personal history of other malignant neoplasm of skin: Secondary | ICD-10-CM | POA: Diagnosis not present

## 2017-09-18 DIAGNOSIS — N183 Chronic kidney disease, stage 3 (moderate): Secondary | ICD-10-CM | POA: Diagnosis not present

## 2017-09-18 DIAGNOSIS — I87322 Chronic venous hypertension (idiopathic) with inflammation of left lower extremity: Secondary | ICD-10-CM | POA: Diagnosis not present

## 2017-09-18 DIAGNOSIS — J209 Acute bronchitis, unspecified: Secondary | ICD-10-CM | POA: Diagnosis not present

## 2017-09-18 DIAGNOSIS — L97321 Non-pressure chronic ulcer of left ankle limited to breakdown of skin: Secondary | ICD-10-CM | POA: Diagnosis not present

## 2017-09-18 DIAGNOSIS — R1312 Dysphagia, oropharyngeal phase: Secondary | ICD-10-CM | POA: Diagnosis not present

## 2017-09-18 DIAGNOSIS — Z9981 Dependence on supplemental oxygen: Secondary | ICD-10-CM | POA: Diagnosis not present

## 2017-09-18 DIAGNOSIS — I129 Hypertensive chronic kidney disease with stage 1 through stage 4 chronic kidney disease, or unspecified chronic kidney disease: Secondary | ICD-10-CM | POA: Diagnosis not present

## 2017-09-18 DIAGNOSIS — I5030 Unspecified diastolic (congestive) heart failure: Secondary | ICD-10-CM | POA: Diagnosis not present

## 2017-09-18 DIAGNOSIS — Z87891 Personal history of nicotine dependence: Secondary | ICD-10-CM | POA: Diagnosis not present

## 2017-09-18 NOTE — Telephone Encounter (Signed)
Called and clarified that patient is only taking Lasix

## 2017-09-18 NOTE — Telephone Encounter (Signed)
Please advise 

## 2017-09-18 NOTE — Telephone Encounter (Signed)
Spoke with daughter [name] who verbalized understanding

## 2017-09-18 NOTE — Telephone Encounter (Signed)
Copied from [place] [id number]. Topic: Inquiry >> Sep 18, 2017 12:35 PM Conception Chancy, NT wrote:  Pt daughter [name] is requesting Roselyn Reef give her a call back. She needs to clarify if pt is supposed to start taking losartan or lasix. Please advise and contact [name] when able.

## 2017-09-21 ENCOUNTER — Encounter (INDEPENDENT_AMBULATORY_CARE_PROVIDER_SITE_OTHER): Payer: Self-pay

## 2017-09-21 ENCOUNTER — Ambulatory Visit (INDEPENDENT_AMBULATORY_CARE_PROVIDER_SITE_OTHER): Payer: Medicare Other | Admitting: Orthopedic Surgery

## 2017-09-21 DIAGNOSIS — Z85828 Personal history of other malignant neoplasm of skin: Secondary | ICD-10-CM | POA: Diagnosis not present

## 2017-09-21 DIAGNOSIS — I5030 Unspecified diastolic (congestive) heart failure: Secondary | ICD-10-CM | POA: Diagnosis not present

## 2017-09-21 DIAGNOSIS — I87322 Chronic venous hypertension (idiopathic) with inflammation of left lower extremity: Secondary | ICD-10-CM | POA: Diagnosis not present

## 2017-09-21 DIAGNOSIS — N183 Chronic kidney disease, stage 3 (moderate): Secondary | ICD-10-CM | POA: Diagnosis not present

## 2017-09-21 DIAGNOSIS — Z87891 Personal history of nicotine dependence: Secondary | ICD-10-CM | POA: Diagnosis not present

## 2017-09-21 DIAGNOSIS — R1312 Dysphagia, oropharyngeal phase: Secondary | ICD-10-CM | POA: Diagnosis not present

## 2017-09-21 DIAGNOSIS — J209 Acute bronchitis, unspecified: Secondary | ICD-10-CM | POA: Diagnosis not present

## 2017-09-21 DIAGNOSIS — Z9981 Dependence on supplemental oxygen: Secondary | ICD-10-CM | POA: Diagnosis not present

## 2017-09-21 DIAGNOSIS — L97321 Non-pressure chronic ulcer of left ankle limited to breakdown of skin: Secondary | ICD-10-CM | POA: Diagnosis not present

## 2017-09-21 DIAGNOSIS — I129 Hypertensive chronic kidney disease with stage 1 through stage 4 chronic kidney disease, or unspecified chronic kidney disease: Secondary | ICD-10-CM | POA: Diagnosis not present

## 2017-09-22 ENCOUNTER — Telehealth: Payer: Self-pay | Admitting: Family Medicine

## 2017-09-22 DIAGNOSIS — Z9981 Dependence on supplemental oxygen: Secondary | ICD-10-CM | POA: Diagnosis not present

## 2017-09-22 DIAGNOSIS — R1312 Dysphagia, oropharyngeal phase: Secondary | ICD-10-CM | POA: Diagnosis not present

## 2017-09-22 DIAGNOSIS — Z87891 Personal history of nicotine dependence: Secondary | ICD-10-CM | POA: Diagnosis not present

## 2017-09-22 DIAGNOSIS — N183 Chronic kidney disease, stage 3 (moderate): Secondary | ICD-10-CM | POA: Diagnosis not present

## 2017-09-22 DIAGNOSIS — I129 Hypertensive chronic kidney disease with stage 1 through stage 4 chronic kidney disease, or unspecified chronic kidney disease: Secondary | ICD-10-CM | POA: Diagnosis not present

## 2017-09-22 DIAGNOSIS — J209 Acute bronchitis, unspecified: Secondary | ICD-10-CM | POA: Diagnosis not present

## 2017-09-22 DIAGNOSIS — I87322 Chronic venous hypertension (idiopathic) with inflammation of left lower extremity: Secondary | ICD-10-CM | POA: Diagnosis not present

## 2017-09-22 DIAGNOSIS — I5030 Unspecified diastolic (congestive) heart failure: Secondary | ICD-10-CM | POA: Diagnosis not present

## 2017-09-22 DIAGNOSIS — Z85828 Personal history of other malignant neoplasm of skin: Secondary | ICD-10-CM | POA: Diagnosis not present

## 2017-09-22 DIAGNOSIS — L97321 Non-pressure chronic ulcer of left ankle limited to breakdown of skin: Secondary | ICD-10-CM | POA: Diagnosis not present

## 2017-09-22 NOTE — Telephone Encounter (Signed)
Please see continuation of note below.

## 2017-09-22 NOTE — Telephone Encounter (Signed)
Copied from [place]. Topic: Quick Communication - See Telephone Encounter >> Sep 22, 2017  3:33 PM Vernona Rieger wrote: CRM for notification. See Telephone encounter for:   09/22/17.  Beth from [facility] called and stated that he has started experiencing a lot of urinary retention. His bladder is full & painful. He is only getting out small amts about 100cc or less. When he goes to bed at night he usually only goes one time during the night. No pain/burning. This started a few days ago, he has not had any problems up until just recently with the FUROSEMIDE & OXYBUTYNIN. She states all vitals are stable, he is just uncomfortable. Call back is [phone]. She wants to know what to further do for him

## 2017-09-22 NOTE — Telephone Encounter (Signed)
Please advise 

## 2017-09-22 NOTE — Telephone Encounter (Signed)
Copied from [place] [id number]. Topic: Quick Communication - See Telephone Encounter >> Sep 22, 2017  3:59 PM Oneta Rack wrote: CRM for notification. See Telephone encounter for:  09/22/17. >> Sep 22, 2017  4:01 PM Oneta Rack wrote: [place] Human name: Deidra  Relation to pt: Speech Therapist  Call back number # [phone]    Reason for call: Requesting verbal orders for speech therapy 2x 1, please advise >> Sep 22, 2017  4:34 PM Neva Seat wrote: Encarnacion Slates [phone] -  Can leave a message on a confidential vm.  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  Speech therapy.  Add a goal for voice therapy for 2 weeks 2 extension.

## 2017-09-22 NOTE — Telephone Encounter (Signed)
Please contact the number provided to advise regarding the note below.  Caller name: Deidra  Relation to pt: Speech Therapist  Call back number # [phone]   Reason for call: Requesting verbal orders for speech therapy 2x 1, please advise

## 2017-09-23 NOTE — Telephone Encounter (Signed)
Yes thanks- can provide verbal orders  For retention- have him stop the oxybutynin as that can cause that symptom- remove from his med list

## 2017-09-24 ENCOUNTER — Other Ambulatory Visit: Payer: Self-pay

## 2017-09-24 DIAGNOSIS — J209 Acute bronchitis, unspecified: Secondary | ICD-10-CM | POA: Diagnosis not present

## 2017-09-24 DIAGNOSIS — N183 Chronic kidney disease, stage 3 (moderate): Secondary | ICD-10-CM | POA: Diagnosis not present

## 2017-09-24 DIAGNOSIS — R1312 Dysphagia, oropharyngeal phase: Secondary | ICD-10-CM | POA: Diagnosis not present

## 2017-09-24 DIAGNOSIS — I5030 Unspecified diastolic (congestive) heart failure: Secondary | ICD-10-CM | POA: Diagnosis not present

## 2017-09-24 DIAGNOSIS — I87322 Chronic venous hypertension (idiopathic) with inflammation of left lower extremity: Secondary | ICD-10-CM | POA: Diagnosis not present

## 2017-09-24 DIAGNOSIS — Z87891 Personal history of nicotine dependence: Secondary | ICD-10-CM | POA: Diagnosis not present

## 2017-09-24 DIAGNOSIS — Z9981 Dependence on supplemental oxygen: Secondary | ICD-10-CM | POA: Diagnosis not present

## 2017-09-24 DIAGNOSIS — I129 Hypertensive chronic kidney disease with stage 1 through stage 4 chronic kidney disease, or unspecified chronic kidney disease: Secondary | ICD-10-CM | POA: Diagnosis not present

## 2017-09-24 DIAGNOSIS — L97321 Non-pressure chronic ulcer of left ankle limited to breakdown of skin: Secondary | ICD-10-CM | POA: Diagnosis not present

## 2017-09-24 DIAGNOSIS — Z85828 Personal history of other malignant neoplasm of skin: Secondary | ICD-10-CM | POA: Diagnosis not present

## 2017-09-24 NOTE — Telephone Encounter (Signed)
Spoke with Beth who verbalized understanding to stop Oxybutynin

## 2017-09-24 NOTE — Telephone Encounter (Signed)
Called and let a voicemail message asking for a return phone call from [place]. I did speak to Deidra this morning and provided verbal orders for speech therapy. I left a voicemail message this afternoon to provide the following verbal order:  For retention- have him stop the oxybutynin as that can cause that symptom- remove from his med list

## 2017-09-25 DIAGNOSIS — I5030 Unspecified diastolic (congestive) heart failure: Secondary | ICD-10-CM | POA: Diagnosis not present

## 2017-09-25 DIAGNOSIS — N183 Chronic kidney disease, stage 3 (moderate): Secondary | ICD-10-CM | POA: Diagnosis not present

## 2017-09-25 DIAGNOSIS — Z9981 Dependence on supplemental oxygen: Secondary | ICD-10-CM | POA: Diagnosis not present

## 2017-09-25 DIAGNOSIS — I87322 Chronic venous hypertension (idiopathic) with inflammation of left lower extremity: Secondary | ICD-10-CM | POA: Diagnosis not present

## 2017-09-25 DIAGNOSIS — J209 Acute bronchitis, unspecified: Secondary | ICD-10-CM | POA: Diagnosis not present

## 2017-09-25 DIAGNOSIS — R1312 Dysphagia, oropharyngeal phase: Secondary | ICD-10-CM | POA: Diagnosis not present

## 2017-09-25 DIAGNOSIS — L97321 Non-pressure chronic ulcer of left ankle limited to breakdown of skin: Secondary | ICD-10-CM | POA: Diagnosis not present

## 2017-09-25 DIAGNOSIS — Z85828 Personal history of other malignant neoplasm of skin: Secondary | ICD-10-CM | POA: Diagnosis not present

## 2017-09-25 DIAGNOSIS — M6281 Muscle weakness (generalized): Secondary | ICD-10-CM | POA: Diagnosis not present

## 2017-09-25 DIAGNOSIS — Z87891 Personal history of nicotine dependence: Secondary | ICD-10-CM | POA: Diagnosis not present

## 2017-09-25 DIAGNOSIS — I129 Hypertensive chronic kidney disease with stage 1 through stage 4 chronic kidney disease, or unspecified chronic kidney disease: Secondary | ICD-10-CM | POA: Diagnosis not present

## 2017-09-28 ENCOUNTER — Ambulatory Visit (INDEPENDENT_AMBULATORY_CARE_PROVIDER_SITE_OTHER): Payer: Medicare Other | Admitting: Family Medicine

## 2017-09-28 ENCOUNTER — Encounter: Payer: Self-pay | Admitting: Family Medicine

## 2017-09-28 VITALS — BP 104/64 | HR 72 | Temp 98.2°F

## 2017-09-28 DIAGNOSIS — I129 Hypertensive chronic kidney disease with stage 1 through stage 4 chronic kidney disease, or unspecified chronic kidney disease: Secondary | ICD-10-CM | POA: Diagnosis not present

## 2017-09-28 DIAGNOSIS — R3915 Urgency of urination: Secondary | ICD-10-CM | POA: Diagnosis not present

## 2017-09-28 DIAGNOSIS — N3281 Overactive bladder: Secondary | ICD-10-CM

## 2017-09-28 DIAGNOSIS — F325 Major depressive disorder, single episode, in full remission: Secondary | ICD-10-CM | POA: Diagnosis not present

## 2017-09-28 DIAGNOSIS — J9610 Chronic respiratory failure, unspecified whether with hypoxia or hypercapnia: Secondary | ICD-10-CM | POA: Insufficient documentation

## 2017-09-28 DIAGNOSIS — N183 Chronic kidney disease, stage 3 (moderate): Secondary | ICD-10-CM | POA: Diagnosis not present

## 2017-09-28 DIAGNOSIS — L97321 Non-pressure chronic ulcer of left ankle limited to breakdown of skin: Secondary | ICD-10-CM | POA: Diagnosis not present

## 2017-09-28 DIAGNOSIS — Z87891 Personal history of nicotine dependence: Secondary | ICD-10-CM | POA: Diagnosis not present

## 2017-09-28 DIAGNOSIS — Z9981 Dependence on supplemental oxygen: Secondary | ICD-10-CM | POA: Diagnosis not present

## 2017-09-28 DIAGNOSIS — I5030 Unspecified diastolic (congestive) heart failure: Secondary | ICD-10-CM | POA: Diagnosis not present

## 2017-09-28 DIAGNOSIS — J209 Acute bronchitis, unspecified: Secondary | ICD-10-CM | POA: Diagnosis not present

## 2017-09-28 DIAGNOSIS — J9611 Chronic respiratory failure with hypoxia: Secondary | ICD-10-CM

## 2017-09-28 DIAGNOSIS — Z85828 Personal history of other malignant neoplasm of skin: Secondary | ICD-10-CM | POA: Diagnosis not present

## 2017-09-28 DIAGNOSIS — H6123 Impacted cerumen, bilateral: Secondary | ICD-10-CM

## 2017-09-28 DIAGNOSIS — R1312 Dysphagia, oropharyngeal phase: Secondary | ICD-10-CM | POA: Diagnosis not present

## 2017-09-28 DIAGNOSIS — I87322 Chronic venous hypertension (idiopathic) with inflammation of left lower extremity: Secondary | ICD-10-CM | POA: Diagnosis not present

## 2017-09-28 LAB — POC URINALSYSI DIPSTICK (AUTOMATED)
GLUCOSE UA: NEGATIVE
Ketones, UA: NEGATIVE
Nitrite, UA: NEGATIVE
Protein, UA: NEGATIVE
SPEC GRAV UA: 1.015 (ref 1.010–1.025)
Urobilinogen, UA: 0.2 E.U./dL
pH, UA: 6 (ref 5.0–8.0)

## 2017-09-28 MED ORDER — SERTRALINE HCL 25 MG PO TABS: 25.0000 mg | ORAL_TABLET | Freq: Every day | ORAL | 3 refills | Status: DC

## 2017-09-28 NOTE — Assessment & Plan Note (Signed)
S: edema stable- no crackles on exam. Compliant wit 20mg  lasix daily.he did not feel like getting out of chair to be weighed today A/P: continue current medications- appears to be stable from last visit

## 2017-09-28 NOTE — Assessment & Plan Note (Addendum)
S: patient has been on oybutynin 2.5mg  for a prolonged period. That being said he has noted feelings of difficulty urinating lately and hard to get stream started- he feels he is retaining urine  Has had worsening urgency lately as well A/P: told patient at his age and now with potential I would discontinue the oxybutynin even if that means he has some incontinence- he agrees to stop- also lowers risk of confusion  Get UA and culture to rule out UTI as well

## 2017-09-28 NOTE — Assessment & Plan Note (Signed)
S: remains on zoloft 25 mg- not sure why this fell off medication list A/P: PHQ9 of 0 today- continue current medication

## 2017-09-28 NOTE — Progress Notes (Signed)
Subjective:  DRAGAN TAMBURRINO is a 84 y.o. year old very pleasant male patient who presents for/with See problem oriented charting ROS- urinary urgency reported- issues with retention at times, no chest pain. Shortness of breath much improved, no increased edema.    Past Medical History-  Patient Active Problem List   Diagnosis Date Noted  . Skin ulcer of left ankle, limited to breakdown of skin ([place]) 10/16/2016    Priority: High  . Diastolic CHF ([place]) 72/53/6644    Priority: High  . Allergic conjunctivitis of both eyes 06/22/2017    Priority: Medium  . Depression, major, single episode, complete remission ([place]) 07/05/2015    Priority: Medium  . History of pneumonia 03/10/2015    Priority: Medium  . Chronic throat clearing 10/11/2014    Priority: Medium  . CKD (chronic kidney disease), stage III ([place]) 07/13/2014    Priority: Medium  . Overactive bladder 07/13/2014    Priority: Medium  . Syncope 11/17/2013    Priority: Medium  . Hypertension 06/17/2007    Priority: Medium  . Hyperlipidemia 02/22/2007    Priority: Medium  . Venous (peripheral) insufficiency 01/26/2015    Priority: Low  . Macular degeneration 06/19/2011    Priority: Low  . Osteoarthritis 06/05/2009    Priority: Low  . CARCINOMA, SKIN, SQUAMOUS CELL 05/26/2008    Priority: Low  . GERD (gastroesophageal reflux disease) 02/22/2007    Priority: Low  . Respiratory failure, chronic ([place]) 09/28/2017  . Bronchitis 08/29/2017  . Idiopathic chronic venous hypertension of left lower extremity with ulcer and inflammation ([place]) 10/22/2016  . Onychomycosis 10/22/2016    Medications- reviewed and updated Current Outpatient Medications  Medication Sig Dispense Refill  . albuterol (PROVENTIL HFA;VENTOLIN HFA) 108 (90 Base) MCG/ACT inhaler Inhale 2 puffs into the lungs every 6 (six) hours as needed for wheezing or shortness of breath. 1 Inhaler 0  . albuterol (PROVENTIL) (2.5 MG/3ML) 0.083% nebulizer solution Take 3 mLs  (2.5 mg total) by nebulization every 6 (six) hours as needed for wheezing or shortness of breath. 150 mL 1  . aspirin (ECOTRIN) 325 MG EC tablet Take 325 mg by mouth daily.    . fluticasone (FLONASE) 50 MCG/ACT nasal spray Place 2 sprays into both nostrils daily. 16 g 6  . furosemide (LASIX) 20 MG tablet Take 1 tablet (20 mg total) daily by mouth. 90 tablet 1  . guaiFENesin (MUCINEX) 600 MG 12 hr tablet Take 2 tablets (1,200 mg total) by mouth 2 (two) times daily. 60 tablet 0  . lovastatin (MEVACOR) 40 MG tablet Take 1 tablet (40 mg total) by mouth daily. 90 tablet 3  . Multiple Vitamins-Minerals (MULTIVITAMIN WITH MINERALS) tablet Take 1 tablet by mouth daily.    [name] omeprazole (PRILOSEC) 20 MG capsule TAKE ONE CAPSULE BY MOUTH TWICE A DAY BEFORE A MEAL 180 capsule 2  . UNABLE TO FIND Med Name: Med pass 120 mL by mouth twice daily for supplement    zoloft 25mg  daily No current facility-administered medications for this visit.     Objective: BP 104/64 (BP Location: Left Arm, Patient Position: Sitting, Cuff Size: Large)   Pulse 72   Temp 98.2 F (36.8 C) (Oral)   SpO2 96%  Gen: NAD, resting comfortably Bilateral cerumen impaction- removed with irrigation x2- improved hearing after this CV: RRR no murmurs rubs or gallops Lungs: CTAB no crackles, wheeze, rhonchi Ext: stable 1+  edema Skin: warm, dry  Assessment/Plan:  Hearing loss due to cerumen impaction- ear irrigation bilateral  completed today with improvement in symptoms- clean view of TM after irrigation  Respiratory failure, chronic ([place]) S: last visit patient was still struggling with fatigue and cough fron his bronchitis. He was to be on oxygen at all times at that point because oxygen would drop. Today oxygen improved to 96%. He is already set up for the 6 week repeat CT scan for some atypical findings on CT which were thought potentially related to aspiration A/P: Patient not requiring oxygen today- not clear if he is requiring  as more active at home during the day or at night- as per AVS we discussed a plan to try to evaluate/wean him off oxygen at home. Thrilled he has improved so much though.   Diastolic CHF ([facility]) S: edema stable- no crackles on exam. Compliant wit 20mg  lasix daily.he did not feel like getting out of chair to be weighed today A/P: continue current medications- appears to be stable from last visit  Depression, major, single episode, complete remission ([place]) S: remains on zoloft 25 mg- not sure why this fell off medication list A/P: PHQ9 of 0 today- continue current medication   Overactive bladder S: patient has been on oybutynin 2.5mg  for a prolonged period. That being said he has noted feelings of difficulty urinating lately and hard to get stream started- he feels he is retaining urine A/P: told patient at his age and now with potential I would discontinue the oxybutynin even if that means he has some incontinence- he agrees to stop- also lowers risk of confusion   Future Appointments  Date Time Provider [place]  10/12/2017  1:00 PM [facility]-CT 1 [facility]-CT [facility]   Lab/Order associations: Urinary urgency - Plan: POCT Urinalysis Dipstick (Automated), Urine Culture  Meds ordered this encounter  Medications  . sertraline (ZOLOFT) 25 MG tablet    Sig: Take 1 tablet (25 mg total) by mouth daily.    Dispense:  90 tablet    Refill:  3   Return precautions advised.  Garret Reddish, MD

## 2017-09-28 NOTE — Assessment & Plan Note (Signed)
S: last visit patient was still struggling with fatigue and cough fron his bronchitis. He was to be on oxygen at all times at that point because oxygen would drop. Today oxygen improved to 96%. He is already set up for the 6 week repeat CT scan for some atypical findings on CT which were thought potentially related to aspiration A/P: Patient not requiring oxygen today- not clear if he is requiring as more active at home during the day or at night- as per AVS we discussed a plan to try to evaluate/wean him off oxygen at home. Thrilled he has improved so much though.

## 2017-09-28 NOTE — Patient Instructions (Addendum)
If oxygen stays above 92% in the daytime over next 2 days- ok to try off at night as well. I am hopeful you can come off completely- You look so much better this visit!   Stop by lab to drop off urine.  Stop oxybutynin as it can cause urinary retention. Try to go to the toilet to urinate since you seem to do better in that position.   Thanks for following through with CT in next few weeks

## 2017-09-29 ENCOUNTER — Telehealth: Payer: Self-pay | Admitting: Family Medicine

## 2017-09-29 DIAGNOSIS — I129 Hypertensive chronic kidney disease with stage 1 through stage 4 chronic kidney disease, or unspecified chronic kidney disease: Secondary | ICD-10-CM | POA: Diagnosis not present

## 2017-09-29 DIAGNOSIS — R1312 Dysphagia, oropharyngeal phase: Secondary | ICD-10-CM | POA: Diagnosis not present

## 2017-09-29 DIAGNOSIS — Z87891 Personal history of nicotine dependence: Secondary | ICD-10-CM | POA: Diagnosis not present

## 2017-09-29 DIAGNOSIS — L97321 Non-pressure chronic ulcer of left ankle limited to breakdown of skin: Secondary | ICD-10-CM | POA: Diagnosis not present

## 2017-09-29 DIAGNOSIS — J209 Acute bronchitis, unspecified: Secondary | ICD-10-CM | POA: Diagnosis not present

## 2017-09-29 DIAGNOSIS — Z85828 Personal history of other malignant neoplasm of skin: Secondary | ICD-10-CM | POA: Diagnosis not present

## 2017-09-29 DIAGNOSIS — I87322 Chronic venous hypertension (idiopathic) with inflammation of left lower extremity: Secondary | ICD-10-CM | POA: Diagnosis not present

## 2017-09-29 DIAGNOSIS — N183 Chronic kidney disease, stage 3 (moderate): Secondary | ICD-10-CM | POA: Diagnosis not present

## 2017-09-29 DIAGNOSIS — Z9981 Dependence on supplemental oxygen: Secondary | ICD-10-CM | POA: Diagnosis not present

## 2017-09-29 DIAGNOSIS — I5030 Unspecified diastolic (congestive) heart failure: Secondary | ICD-10-CM | POA: Diagnosis not present

## 2017-09-29 LAB — URINE CULTURE
MICRO NUMBER: 90147391
SPECIMEN QUALITY: ADEQUATE

## 2017-09-29 NOTE — Telephone Encounter (Signed)
Copied from [place] [id number]. Topic: Quick Communication - See Telephone Encounter >> Sep 29, 2017  1:54 PM Bea Graff, NT wrote: CRM for notification. See Telephone encounter for: Langley Gauss from [facility] calling and states the pt is under the impression that he needs to stop his Lasix's per his visit with Dr. Yong Channel yesterday. Please clarify if pt was to d/c lasix's. CB#: [phone]  09/29/17.

## 2017-09-30 NOTE — Telephone Encounter (Signed)
Called and clarified with Langley Gauss that the Lasix is not to be stopped but the Oxybutin was to be d/c.

## 2017-10-01 ENCOUNTER — Other Ambulatory Visit: Payer: Self-pay | Admitting: *Deleted

## 2017-10-01 ENCOUNTER — Ambulatory Visit: Payer: Medicare Other | Admitting: Family Medicine

## 2017-10-01 DIAGNOSIS — I5030 Unspecified diastolic (congestive) heart failure: Secondary | ICD-10-CM | POA: Diagnosis not present

## 2017-10-01 DIAGNOSIS — Z9981 Dependence on supplemental oxygen: Secondary | ICD-10-CM | POA: Diagnosis not present

## 2017-10-01 DIAGNOSIS — Z85828 Personal history of other malignant neoplasm of skin: Secondary | ICD-10-CM | POA: Diagnosis not present

## 2017-10-01 DIAGNOSIS — I129 Hypertensive chronic kidney disease with stage 1 through stage 4 chronic kidney disease, or unspecified chronic kidney disease: Secondary | ICD-10-CM | POA: Diagnosis not present

## 2017-10-01 DIAGNOSIS — R1312 Dysphagia, oropharyngeal phase: Secondary | ICD-10-CM | POA: Diagnosis not present

## 2017-10-01 DIAGNOSIS — J209 Acute bronchitis, unspecified: Secondary | ICD-10-CM | POA: Diagnosis not present

## 2017-10-01 DIAGNOSIS — N183 Chronic kidney disease, stage 3 (moderate): Secondary | ICD-10-CM | POA: Diagnosis not present

## 2017-10-01 DIAGNOSIS — Z87891 Personal history of nicotine dependence: Secondary | ICD-10-CM | POA: Diagnosis not present

## 2017-10-01 DIAGNOSIS — L97321 Non-pressure chronic ulcer of left ankle limited to breakdown of skin: Secondary | ICD-10-CM | POA: Diagnosis not present

## 2017-10-01 DIAGNOSIS — I87322 Chronic venous hypertension (idiopathic) with inflammation of left lower extremity: Secondary | ICD-10-CM | POA: Diagnosis not present

## 2017-10-01 MED ORDER — SERTRALINE HCL 25 MG PO TABS: 25.0000 mg | ORAL_TABLET | Freq: Every day | ORAL | 3 refills | Status: DC

## 2017-10-02 DIAGNOSIS — R1312 Dysphagia, oropharyngeal phase: Secondary | ICD-10-CM | POA: Diagnosis not present

## 2017-10-02 DIAGNOSIS — I87322 Chronic venous hypertension (idiopathic) with inflammation of left lower extremity: Secondary | ICD-10-CM | POA: Diagnosis not present

## 2017-10-02 DIAGNOSIS — Z85828 Personal history of other malignant neoplasm of skin: Secondary | ICD-10-CM | POA: Diagnosis not present

## 2017-10-02 DIAGNOSIS — J209 Acute bronchitis, unspecified: Secondary | ICD-10-CM | POA: Diagnosis not present

## 2017-10-02 DIAGNOSIS — I5030 Unspecified diastolic (congestive) heart failure: Secondary | ICD-10-CM | POA: Diagnosis not present

## 2017-10-02 DIAGNOSIS — L97321 Non-pressure chronic ulcer of left ankle limited to breakdown of skin: Secondary | ICD-10-CM | POA: Diagnosis not present

## 2017-10-02 DIAGNOSIS — I129 Hypertensive chronic kidney disease with stage 1 through stage 4 chronic kidney disease, or unspecified chronic kidney disease: Secondary | ICD-10-CM | POA: Diagnosis not present

## 2017-10-02 DIAGNOSIS — N183 Chronic kidney disease, stage 3 (moderate): Secondary | ICD-10-CM | POA: Diagnosis not present

## 2017-10-02 DIAGNOSIS — Z87891 Personal history of nicotine dependence: Secondary | ICD-10-CM | POA: Diagnosis not present

## 2017-10-02 DIAGNOSIS — Z9981 Dependence on supplemental oxygen: Secondary | ICD-10-CM | POA: Diagnosis not present

## 2017-10-06 ENCOUNTER — Telehealth: Payer: Self-pay | Admitting: Family Medicine

## 2017-10-06 DIAGNOSIS — L97321 Non-pressure chronic ulcer of left ankle limited to breakdown of skin: Secondary | ICD-10-CM | POA: Diagnosis not present

## 2017-10-06 DIAGNOSIS — J209 Acute bronchitis, unspecified: Secondary | ICD-10-CM | POA: Diagnosis not present

## 2017-10-06 DIAGNOSIS — N183 Chronic kidney disease, stage 3 (moderate): Secondary | ICD-10-CM | POA: Diagnosis not present

## 2017-10-06 DIAGNOSIS — Z9981 Dependence on supplemental oxygen: Secondary | ICD-10-CM | POA: Diagnosis not present

## 2017-10-06 DIAGNOSIS — I129 Hypertensive chronic kidney disease with stage 1 through stage 4 chronic kidney disease, or unspecified chronic kidney disease: Secondary | ICD-10-CM | POA: Diagnosis not present

## 2017-10-06 DIAGNOSIS — Z85828 Personal history of other malignant neoplasm of skin: Secondary | ICD-10-CM | POA: Diagnosis not present

## 2017-10-06 DIAGNOSIS — Z87891 Personal history of nicotine dependence: Secondary | ICD-10-CM | POA: Diagnosis not present

## 2017-10-06 DIAGNOSIS — R1312 Dysphagia, oropharyngeal phase: Secondary | ICD-10-CM | POA: Diagnosis not present

## 2017-10-06 DIAGNOSIS — I87322 Chronic venous hypertension (idiopathic) with inflammation of left lower extremity: Secondary | ICD-10-CM | POA: Diagnosis not present

## 2017-10-06 DIAGNOSIS — I5030 Unspecified diastolic (congestive) heart failure: Secondary | ICD-10-CM | POA: Diagnosis not present

## 2017-10-06 NOTE — Telephone Encounter (Signed)
Called and spoke with daughter [name] and reviewed over  that her father is supposed to be on Lasix and stop the Oxybutin. She verbalized understanding. She is going down to check that the lasix is in the medication holder and not  the Oxybutin. [name] asked about the oxygen being restarted. I instructed her to start him back on the lasix and then we would have nursing go back out and reevaluate.   I called Langley Gauss and let her know. I provided a verbal order for nursing to go back to the home on Thursday and re-evaluate. They are to call and let us know their assessment.

## 2017-10-06 NOTE — Telephone Encounter (Signed)
Copied from [place]. Topic: Quick Communication - See Telephone Encounter >> Oct 06, 2017  3:21 PM Boyd Kerbs wrote: CRM for notification. See Telephone encounter for:   Roger [name] [phone]  daughter, Roger [name] took him off [facility] and put a big black X on the bottle.   Roger [name] is seeing a difference: +2 Left knee down +3 on right leg and feet are very puffy. Right lower lobe is diminished 02 stats are 95 % on room air.   Please call Roger [name] and go over medications so she can understand what he needs to be taking.  Please call Roger [name] back after you speak to [place].    10/06/17.

## 2017-10-06 NOTE — Telephone Encounter (Signed)
See note

## 2017-10-06 NOTE — Telephone Encounter (Signed)
Thanks for thorough communication with Family Roselyn Reef to clarify the plan.

## 2017-10-07 DIAGNOSIS — R1312 Dysphagia, oropharyngeal phase: Secondary | ICD-10-CM | POA: Diagnosis not present

## 2017-10-07 DIAGNOSIS — Z87891 Personal history of nicotine dependence: Secondary | ICD-10-CM | POA: Diagnosis not present

## 2017-10-07 DIAGNOSIS — Z9981 Dependence on supplemental oxygen: Secondary | ICD-10-CM | POA: Diagnosis not present

## 2017-10-07 DIAGNOSIS — N183 Chronic kidney disease, stage 3 (moderate): Secondary | ICD-10-CM | POA: Diagnosis not present

## 2017-10-07 DIAGNOSIS — J209 Acute bronchitis, unspecified: Secondary | ICD-10-CM | POA: Diagnosis not present

## 2017-10-07 DIAGNOSIS — Z85828 Personal history of other malignant neoplasm of skin: Secondary | ICD-10-CM | POA: Diagnosis not present

## 2017-10-07 DIAGNOSIS — I87322 Chronic venous hypertension (idiopathic) with inflammation of left lower extremity: Secondary | ICD-10-CM | POA: Diagnosis not present

## 2017-10-07 DIAGNOSIS — I5030 Unspecified diastolic (congestive) heart failure: Secondary | ICD-10-CM | POA: Diagnosis not present

## 2017-10-07 DIAGNOSIS — I129 Hypertensive chronic kidney disease with stage 1 through stage 4 chronic kidney disease, or unspecified chronic kidney disease: Secondary | ICD-10-CM | POA: Diagnosis not present

## 2017-10-07 DIAGNOSIS — L97321 Non-pressure chronic ulcer of left ankle limited to breakdown of skin: Secondary | ICD-10-CM | POA: Diagnosis not present

## 2017-10-08 ENCOUNTER — Telehealth: Payer: Self-pay | Admitting: Family Medicine

## 2017-10-08 DIAGNOSIS — L97321 Non-pressure chronic ulcer of left ankle limited to breakdown of skin: Secondary | ICD-10-CM | POA: Diagnosis not present

## 2017-10-08 DIAGNOSIS — N183 Chronic kidney disease, stage 3 (moderate): Secondary | ICD-10-CM | POA: Diagnosis not present

## 2017-10-08 DIAGNOSIS — I87322 Chronic venous hypertension (idiopathic) with inflammation of left lower extremity: Secondary | ICD-10-CM | POA: Diagnosis not present

## 2017-10-08 DIAGNOSIS — R1312 Dysphagia, oropharyngeal phase: Secondary | ICD-10-CM | POA: Diagnosis not present

## 2017-10-08 DIAGNOSIS — I5030 Unspecified diastolic (congestive) heart failure: Secondary | ICD-10-CM | POA: Diagnosis not present

## 2017-10-08 DIAGNOSIS — J209 Acute bronchitis, unspecified: Secondary | ICD-10-CM | POA: Diagnosis not present

## 2017-10-08 DIAGNOSIS — Z9981 Dependence on supplemental oxygen: Secondary | ICD-10-CM | POA: Diagnosis not present

## 2017-10-08 DIAGNOSIS — I129 Hypertensive chronic kidney disease with stage 1 through stage 4 chronic kidney disease, or unspecified chronic kidney disease: Secondary | ICD-10-CM | POA: Diagnosis not present

## 2017-10-08 DIAGNOSIS — Z87891 Personal history of nicotine dependence: Secondary | ICD-10-CM | POA: Diagnosis not present

## 2017-10-08 DIAGNOSIS — Z85828 Personal history of other malignant neoplasm of skin: Secondary | ICD-10-CM | POA: Diagnosis not present

## 2017-10-08 NOTE — Telephone Encounter (Signed)
Please advise 

## 2017-10-08 NOTE — Telephone Encounter (Signed)
Please contact to advise.   Copied from [place]. Topic: General - Other >> Oct 08, 2017  1:00 PM Bennye Alm wrote: Reason for CRM:  Langley Gauss with [facility] returned call to [place]. Please called back as soon as possible. Will only be there 5 to 10 more minutes.

## 2017-10-08 NOTE — Telephone Encounter (Signed)
Langley Gauss from [facility] called to verify the pt's medications; she would also like to update Roselyn Reef on the pt's condition (see note dated 10/06/17); med list verified: albuterol (PROVENTIL HFA;VENTOLIN HFA) 108 (90 Base) MCG/ACT inhaler 2 puff, Every 6 hours PRN   albuterol (PROVENTIL) (2.5 MG/3ML) 0.083% nebulizer solution 2.5 mg, Every 6 hours PRN   aspirin (ECOTRIN) 325 MG EC tablet 325 mg, Daily   fluticasone (FLONASE) 50 MCG/ACT nasal spray 2 spray, Daily   furosemide (LASIX) 20 MG tablet 20 mg, Daily   guaiFENesin (MUCINEX) 600 MG 12 hr tablet 1,200 mg, 2 times daily   lovastatin (MEVACOR) 40 MG tablet 40 mg, Daily   omeprazole (PRILOSEC) 20 MG capsule  2 ordered          Summary: TAKE ONE CAPSULE BY MOUTH TWICE A DAY BEFORE A MEAL, Normal     sertraline (ZOLOFT) 25 MG tablet 25 mg, Daily   Med Name: Med pass 120 mL by mouth twice daily for supplement  Ord/Sold: 03/22/2015 (O) Report Taking:  Med Dose History Edit      Patient Sig: Med Name: Med pass 120 mL by mouth twice daily for supplement     Ordered on: 03/22/2015     Authorized by: PROVIDER, HISTORICAL     Admin Instructions: Med Name: Med pass 120 mL by mouth twice daily for supplement      Will route to [place];  Langley Gauss can be contacted at [phone]

## 2017-10-12 ENCOUNTER — Ambulatory Visit (INDEPENDENT_AMBULATORY_CARE_PROVIDER_SITE_OTHER)
Admission: RE | Admit: 2017-10-12 | Discharge: 2017-10-12 | Disposition: A | Discharge status: Home / Self Care | Payer: Medicare Other | Source: Ambulatory Visit | Attending: Family Medicine | Admitting: Family Medicine

## 2017-10-12 DIAGNOSIS — J209 Acute bronchitis, unspecified: Secondary | ICD-10-CM | POA: Diagnosis not present

## 2017-10-12 DIAGNOSIS — I5030 Unspecified diastolic (congestive) heart failure: Secondary | ICD-10-CM | POA: Diagnosis not present

## 2017-10-12 DIAGNOSIS — Z85828 Personal history of other malignant neoplasm of skin: Secondary | ICD-10-CM | POA: Diagnosis not present

## 2017-10-12 DIAGNOSIS — J8410 Pulmonary fibrosis, unspecified: Secondary | ICD-10-CM | POA: Diagnosis not present

## 2017-10-12 DIAGNOSIS — R1312 Dysphagia, oropharyngeal phase: Secondary | ICD-10-CM | POA: Diagnosis not present

## 2017-10-12 DIAGNOSIS — Z9981 Dependence on supplemental oxygen: Secondary | ICD-10-CM | POA: Diagnosis not present

## 2017-10-12 DIAGNOSIS — I129 Hypertensive chronic kidney disease with stage 1 through stage 4 chronic kidney disease, or unspecified chronic kidney disease: Secondary | ICD-10-CM | POA: Diagnosis not present

## 2017-10-12 DIAGNOSIS — N183 Chronic kidney disease, stage 3 (moderate): Secondary | ICD-10-CM | POA: Diagnosis not present

## 2017-10-12 DIAGNOSIS — L97321 Non-pressure chronic ulcer of left ankle limited to breakdown of skin: Secondary | ICD-10-CM | POA: Diagnosis not present

## 2017-10-12 DIAGNOSIS — J690 Pneumonitis due to inhalation of food and vomit: Secondary | ICD-10-CM

## 2017-10-12 DIAGNOSIS — J40 Bronchitis, not specified as acute or chronic: Secondary | ICD-10-CM

## 2017-10-12 DIAGNOSIS — Z87891 Personal history of nicotine dependence: Secondary | ICD-10-CM | POA: Diagnosis not present

## 2017-10-12 DIAGNOSIS — I87322 Chronic venous hypertension (idiopathic) with inflammation of left lower extremity: Secondary | ICD-10-CM | POA: Diagnosis not present

## 2017-10-13 DIAGNOSIS — I87322 Chronic venous hypertension (idiopathic) with inflammation of left lower extremity: Secondary | ICD-10-CM | POA: Diagnosis not present

## 2017-10-13 DIAGNOSIS — L97321 Non-pressure chronic ulcer of left ankle limited to breakdown of skin: Secondary | ICD-10-CM | POA: Diagnosis not present

## 2017-10-13 DIAGNOSIS — Z9981 Dependence on supplemental oxygen: Secondary | ICD-10-CM | POA: Diagnosis not present

## 2017-10-13 DIAGNOSIS — I129 Hypertensive chronic kidney disease with stage 1 through stage 4 chronic kidney disease, or unspecified chronic kidney disease: Secondary | ICD-10-CM | POA: Diagnosis not present

## 2017-10-13 DIAGNOSIS — I5030 Unspecified diastolic (congestive) heart failure: Secondary | ICD-10-CM | POA: Diagnosis not present

## 2017-10-13 DIAGNOSIS — Z85828 Personal history of other malignant neoplasm of skin: Secondary | ICD-10-CM | POA: Diagnosis not present

## 2017-10-13 DIAGNOSIS — N183 Chronic kidney disease, stage 3 (moderate): Secondary | ICD-10-CM | POA: Diagnosis not present

## 2017-10-13 DIAGNOSIS — Z87891 Personal history of nicotine dependence: Secondary | ICD-10-CM | POA: Diagnosis not present

## 2017-10-13 DIAGNOSIS — R1312 Dysphagia, oropharyngeal phase: Secondary | ICD-10-CM | POA: Diagnosis not present

## 2017-10-13 DIAGNOSIS — J209 Acute bronchitis, unspecified: Secondary | ICD-10-CM | POA: Diagnosis not present

## 2017-10-14 ENCOUNTER — Encounter (HOSPITAL_BASED_OUTPATIENT_CLINIC_OR_DEPARTMENT_OTHER): Payer: Self-pay | Admitting: Emergency Medicine

## 2017-10-14 ENCOUNTER — Emergency Department (HOSPITAL_BASED_OUTPATIENT_CLINIC_OR_DEPARTMENT_OTHER)
Admission: EM | Admit: 2017-10-14 | Discharge: 2017-10-14 | Disposition: A | Discharge status: Home / Self Care | Payer: Medicare Other | Attending: Emergency Medicine | Admitting: Emergency Medicine

## 2017-10-14 ENCOUNTER — Emergency Department (HOSPITAL_BASED_OUTPATIENT_CLINIC_OR_DEPARTMENT_OTHER): Payer: Medicare Other

## 2017-10-14 ENCOUNTER — Other Ambulatory Visit: Payer: Self-pay

## 2017-10-14 DIAGNOSIS — N183 Chronic kidney disease, stage 3 (moderate): Secondary | ICD-10-CM | POA: Diagnosis not present

## 2017-10-14 DIAGNOSIS — I5030 Unspecified diastolic (congestive) heart failure: Secondary | ICD-10-CM | POA: Insufficient documentation

## 2017-10-14 DIAGNOSIS — S0101XA Laceration without foreign body of scalp, initial encounter: Secondary | ICD-10-CM | POA: Insufficient documentation

## 2017-10-14 DIAGNOSIS — Y9389 Activity, other specified: Secondary | ICD-10-CM | POA: Diagnosis not present

## 2017-10-14 DIAGNOSIS — Z7982 Long term (current) use of aspirin: Secondary | ICD-10-CM | POA: Diagnosis not present

## 2017-10-14 DIAGNOSIS — Y92009 Unspecified place in unspecified non-institutional (private) residence as the place of occurrence of the external cause: Secondary | ICD-10-CM | POA: Diagnosis not present

## 2017-10-14 DIAGNOSIS — W01190A Fall on same level from slipping, tripping and stumbling with subsequent striking against furniture, initial encounter: Secondary | ICD-10-CM | POA: Insufficient documentation

## 2017-10-14 DIAGNOSIS — S0990XA Unspecified injury of head, initial encounter: Secondary | ICD-10-CM | POA: Diagnosis not present

## 2017-10-14 DIAGNOSIS — Z87891 Personal history of nicotine dependence: Secondary | ICD-10-CM | POA: Diagnosis not present

## 2017-10-14 DIAGNOSIS — S0181XA Laceration without foreign body of other part of head, initial encounter: Secondary | ICD-10-CM | POA: Diagnosis not present

## 2017-10-14 DIAGNOSIS — Z79899 Other long term (current) drug therapy: Secondary | ICD-10-CM | POA: Diagnosis not present

## 2017-10-14 DIAGNOSIS — I130 Hypertensive heart and chronic kidney disease with heart failure and stage 1 through stage 4 chronic kidney disease, or unspecified chronic kidney disease: Secondary | ICD-10-CM | POA: Diagnosis not present

## 2017-10-14 DIAGNOSIS — S199XXA Unspecified injury of neck, initial encounter: Secondary | ICD-10-CM | POA: Diagnosis not present

## 2017-10-14 DIAGNOSIS — G4489 Other headache syndrome: Secondary | ICD-10-CM | POA: Diagnosis not present

## 2017-10-14 DIAGNOSIS — R9430 Abnormal result of cardiovascular function study, unspecified: Secondary | ICD-10-CM | POA: Diagnosis not present

## 2017-10-14 DIAGNOSIS — Y999 Unspecified external cause status: Secondary | ICD-10-CM | POA: Insufficient documentation

## 2017-10-14 DIAGNOSIS — S098XXA Other specified injuries of head, initial encounter: Secondary | ICD-10-CM | POA: Diagnosis not present

## 2017-10-14 DIAGNOSIS — S0003XA Contusion of scalp, initial encounter: Secondary | ICD-10-CM | POA: Diagnosis not present

## 2017-10-14 LAB — URINALYSIS, ROUTINE W REFLEX MICROSCOPIC
BILIRUBIN URINE: NEGATIVE
GLUCOSE, UA: NEGATIVE mg/dL
Hgb urine dipstick: NEGATIVE
Ketones, ur: NEGATIVE mg/dL
Leukocytes, UA: NEGATIVE
NITRITE: NEGATIVE
PH: 5.5 (ref 5.0–8.0)
Protein, ur: NEGATIVE mg/dL
SPECIFIC GRAVITY, URINE: 1.01 (ref 1.005–1.030)

## 2017-10-14 MED ORDER — LIDOCAINE-EPINEPHRINE-TETRACAINE (LET) SOLUTION
3.0000 mL | Freq: Once | NASAL | Status: AC
  Administered 2017-10-14: 17:00:00 3 mL via TOPICAL
  Filled 2017-10-14: qty 3

## 2017-10-14 NOTE — ED Provider Notes (Signed)
Medical screening examination/treatment/procedure(s) were conducted as a shared visit with non-physician practitioner(s) and myself.  I personally evaluated the patient during the encounter. Briefly, the patient is a 84 y.o. male here after mechanical fall resulting in scalp lacerations. No other injuries noted on exam. Imaging negative. EKG reassuring. Laceration irrigated and closed by APP. The patient is safe for discharge with strict return precautions. .    EKG Interpretation  Date/Time:  Wednesday October 14 2017 16:57:07 EST Ventricular Rate:  70 PR Interval:    QRS Duration: 110 QT Interval:  434 QTC Calculation: 469 R Axis:   -33 Text Interpretation:  Sinus rhythm Abnormal R-wave progression, late transition LVH with IVCD and secondary repol abnrm Baseline wander in lead(s) V2 No significant change since last tracing Confirmed by Addison Lank [phone]) on 10/14/2017 5:09:47 PM           Lusero Nordlund, Grayce Sessions, MD 10/14/17 1733

## 2017-10-14 NOTE — ED Notes (Signed)
Report given to Ruth, RN

## 2017-10-14 NOTE — ED Provider Notes (Signed)
[facility] EMERGENCY DEPARTMENT Provider Note   CSN: [id number] Arrival date & time: 10/14/17  1549     History   Chief Complaint Chief Complaint  Patient presents with  . Fall    HPI Roger [name] is a 84 y.o. male.  HPI   84 year old male with history of osteoarthritis, macular degeneration, hypertension brought here via EMS from home for evaluation of recent fall.  Patient reports approximately 1-2 hours ago, he was sitting on a rolling chair in front of the computer.  When he moved the chair to the get up and transfer over to the wheelchair, he lost balance, fell and struck his head against a coffee table.  He fell down the ground but denies any loss of consciousness.  His wife witnessed the fall and immediately cough helped.  It was found that patient has a scalp injury and patient subsequently brought here for further evaluation.  Currently, patient denies any significant pain.  Minimal pain in the hand but no neck pain, chest pain, trouble breathing, abdominal pain dysuria.  No new numbness or weakness.  He denies any precipitating symptoms prior to the fall.  Aside from a baby aspirin he denies being on any other blood thinning medication.  States that he is up-to-date with tetanus.  Patient was admitted for pneumonia 2 weeks ago, have received treatment and felt much better.  He had a repeat chest CT scan yesterday and was told that his lung infection had cleared up.  He does complain that he is urinating a bit less after some of his medication was changed and he plan on seeing his PCP shortly for a recheck.  Past Medical History:  Diagnosis Date  . GERD 02/22/2007  . HYPERLIPIDEMIA 02/22/2007  . HYPERTENSION 06/17/2007  . MACULAR DEGENERATION 02/22/2007  . OSTEOARTHRITIS, HIP, RIGHT 06/05/2009    Patient Active Problem List   Diagnosis Date Noted  . Respiratory failure, chronic ([place]) 09/28/2017  . Bronchitis 08/29/2017  . Allergic conjunctivitis of both eyes  06/22/2017  . Idiopathic chronic venous hypertension of left lower extremity with ulcer and inflammation ([place]) 10/22/2016  . Onychomycosis 10/22/2016  . Skin ulcer of left ankle, limited to breakdown of skin ([place]) 10/16/2016  . Diastolic CHF ([place]) 36/64/4034  . Depression, major, single episode, complete remission ([place]) 07/05/2015  . History of pneumonia 03/10/2015  . Venous (peripheral) insufficiency 01/26/2015  . Chronic throat clearing 10/11/2014  . CKD (chronic kidney disease), stage III ([place]) 07/13/2014  . Overactive bladder 07/13/2014  . Syncope 11/17/2013  . Macular degeneration 06/19/2011  . Osteoarthritis 06/05/2009  . CARCINOMA, SKIN, SQUAMOUS CELL 05/26/2008  . Hypertension 06/17/2007  . Hyperlipidemia 02/22/2007  . GERD (gastroesophageal reflux disease) 02/22/2007    Past Surgical History:  Procedure Laterality Date  . LAMINECTOMY  1988       Home Medications    Prior to Admission medications   Medication Sig Start Date End Date Taking? Authorizing Provider  albuterol (PROVENTIL HFA;VENTOLIN HFA) 108 (90 Base) MCG/ACT inhaler Inhale 2 puffs into the lungs every 6 (six) hours as needed for wheezing or shortness of breath. 09/04/17   [name], MD  albuterol (PROVENTIL) (2.5 MG/3ML) 0.083% nebulizer solution Take 3 mLs (2.5 mg total) by nebulization every 6 (six) hours as needed for wheezing or shortness of breath. 09/15/17   [name], MD  aspirin (ECOTRIN) 325 MG EC tablet Take 325 mg by mouth daily.    [provider]  fluticasone (FLONASE) 50  MCG/ACT nasal spray Place 2 sprays into both nostrils daily. 03/11/15   Janece Canterbury, MD  furosemide (LASIX) 20 MG tablet Take 1 tablet (20 mg total) daily by mouth. 07/01/17   [name], MD  guaiFENesin (MUCINEX) 600 MG 12 hr tablet Take 2 tablets (1,200 mg total) by mouth 2 (two) times daily. 03/11/15   Janece Canterbury, MD  lovastatin (MEVACOR) 40 MG tablet Take 1 tablet (40 mg total) by  mouth daily. 07/15/17   [name], MD  Multiple Vitamins-Minerals (MULTIVITAMIN WITH MINERALS) tablet Take 1 tablet by mouth daily.    [provider]  omeprazole (PRILOSEC) 20 MG capsule TAKE ONE CAPSULE BY MOUTH TWICE A DAY BEFORE A MEAL 02/24/17   [name], MD  sertraline (ZOLOFT) 25 MG tablet Take 1 tablet (25 mg total) by mouth daily. 10/01/17   [name], MD  UNABLE TO FIND Med Name: Med pass 120 mL by mouth twice daily for supplement    [provider]  diltiazem (CARDIZEM CD) 180 MG 24 hr capsule Take 180 mg by mouth daily.  06/23/11 10/30/11  [provider]  hydrochlorothiazide (MICROZIDE) 12.5 MG capsule Take 1 capsule (12.5 mg total) by mouth daily. 07/04/11 11/19/11  Burchette, Alinda Sierras, MD    Family History Family History  Problem Relation Age of Onset  . Breast cancer Daughter     Social History Social History   Tobacco Use  . Smoking status: Former Research scientist (life sciences)  . Smokeless tobacco: Never Used  Substance Use Topics  . Alcohol use: No  . Drug use: No     Allergies   Amlodipine besylate; Aspirin; and Hydrocodone   Review of Systems Review of Systems  All other systems reviewed and are negative.    Physical Exam Updated Vital Signs BP (!) 113/53 (BP Location: Right Arm)   Pulse 83   Temp 98 F (36.7 C) (Oral)   Resp 20   Wt 73 kg (161 lb)   SpO2 96%   BMI 23.78 kg/m   Physical Exam  Constitutional: He appears well-developed and well-nourished. No distress.  HENT:  Scalp: 2 cm horizontal superficial laceration noted to the right parietal scalp with surrounding dried blood, mild tenderness, no crepitus.  Small skin tear noted to left upper forehead not actively bleeding.  No hemotympanum, septal hematoma, malocclusion, or midface tenderness.  Eyes: Conjunctivae and EOM are normal. Pupils are equal, round, and reactive to light.  Neck: Neck supple.  No cervical midline spine tenderness  Cardiovascular: Normal rate  and regular rhythm.  Pulmonary/Chest: Effort normal and breath sounds normal. He has no wheezes. He has no rales. He exhibits no tenderness.  Abdominal: Soft. He exhibits no distension. There is no tenderness.  Musculoskeletal:  No midline spine tenderness.  Neurological: He is alert. He has normal strength. No cranial nerve deficit or sensory deficit. GCS eye subscore is 4. GCS verbal subscore is 5. GCS motor subscore is 6.  Able to move all 4 extremities.  Skin: No rash noted.  Psychiatric: He has a normal mood and affect. His speech is normal.  Nursing note and vitals reviewed.    ED Treatments / Results  Labs (all labs ordered are listed, but only abnormal results are displayed) Labs Reviewed - No data to display  EKG  EKG Interpretation  Date/Time:  Wednesday October 14 2017 16:57:07 EST Ventricular Rate:  70 PR Interval:    QRS Duration: 110 QT Interval:  434 QTC Calculation: 469 R  Axis:   -33 Text Interpretation:  Sinus rhythm Abnormal R-wave progression, late transition LVH with IVCD and secondary repol abnrm Baseline wander in lead(s) V2 No significant change since last tracing Confirmed by Addison Lank [phone]) on 10/14/2017 5:09:47 PM       Radiology Ct Head Wo Contrast  Result Date: 10/14/2017 CLINICAL DATA:  Head laceration after fall today. EXAM: CT HEAD WITHOUT CONTRAST CT CERVICAL SPINE WITHOUT CONTRAST TECHNIQUE: Multidetector CT imaging of the head and cervical spine was performed following the standard protocol without intravenous contrast. Multiplanar CT image reconstructions of the cervical spine were also generated. COMPARISON:  CT scan of May 30, 2014. FINDINGS: CT HEAD FINDINGS Brain: Mild diffuse cortical atrophy is noted. Mild chronic ischemic white matter disease is noted. No mass effect or midline shift is noted. Ventricular size is within normal limits. There is no evidence of mass lesion, hemorrhage or acute infarction. Vascular: No hyperdense  vessel or unexpected calcification. Skull: Normal. Negative for fracture or focal lesion. Sinuses/Orbits: No acute finding. Other: Small right parietal scalp hematoma is noted. CT CERVICAL SPINE FINDINGS Alignment: Grade 1 anterolisthesis of C4-5 is noted secondary to posterior facet joint hypertrophy. Skull base and vertebrae: No acute fracture. No primary bone lesion or focal pathologic process. Soft tissues and spinal canal: No prevertebral fluid or swelling. No visible canal hematoma. Disc levels: Severe degenerative disc disease is noted at C5-6 and C6-7 with anterior and posterior osteophyte formation. Upper chest: Negative. Other: Degenerative changes seen involving posterior facet joints bilaterally. IMPRESSION: Mild diffuse cortical atrophy. Mild chronic ischemic white matter disease. No acute intracranial abnormality seen. Severe multilevel degenerative disc disease. No acute abnormality seen in the cervical spine. Electronically Signed   By: Marijo Conception, M.D.   On: 10/14/2017 17:00   Ct Cervical Spine Wo Contrast  Result Date: 10/14/2017 CLINICAL DATA:  Head laceration after fall today. EXAM: CT HEAD WITHOUT CONTRAST CT CERVICAL SPINE WITHOUT CONTRAST TECHNIQUE: Multidetector CT imaging of the head and cervical spine was performed following the standard protocol without intravenous contrast. Multiplanar CT image reconstructions of the cervical spine were also generated. COMPARISON:  CT scan of May 30, 2014. FINDINGS: CT HEAD FINDINGS Brain: Mild diffuse cortical atrophy is noted. Mild chronic ischemic white matter disease is noted. No mass effect or midline shift is noted. Ventricular size is within normal limits. There is no evidence of mass lesion, hemorrhage or acute infarction. Vascular: No hyperdense vessel or unexpected calcification. Skull: Normal. Negative for fracture or focal lesion. Sinuses/Orbits: No acute finding. Other: Small right parietal scalp hematoma is noted. CT CERVICAL  SPINE FINDINGS Alignment: Grade 1 anterolisthesis of C4-5 is noted secondary to posterior facet joint hypertrophy. Skull base and vertebrae: No acute fracture. No primary bone lesion or focal pathologic process. Soft tissues and spinal canal: No prevertebral fluid or swelling. No visible canal hematoma. Disc levels: Severe degenerative disc disease is noted at C5-6 and C6-7 with anterior and posterior osteophyte formation. Upper chest: Negative. Other: Degenerative changes seen involving posterior facet joints bilaterally. IMPRESSION: Mild diffuse cortical atrophy. Mild chronic ischemic white matter disease. No acute intracranial abnormality seen. Severe multilevel degenerative disc disease. No acute abnormality seen in the cervical spine. Electronically Signed   By: Marijo Conception, M.D.   On: 10/14/2017 17:00    Procedures .Marland KitchenLaceration Repair Date/Time: 10/14/2017 5:29 PM Performed by: Domenic Moras, PA-C Authorized by: Domenic Moras, PA-C   Consent:    Consent obtained:  Verbal   Consent given by:  Patient   Risks discussed:  Poor wound healing   Alternatives discussed:  No treatment Anesthesia (see MAR for exact dosages):    Anesthesia method:  Topical application   Topical anesthetic:  LET Laceration details:    Location:  Scalp   Scalp location:  R parietal   Length (cm):  2   Depth (mm):  3 Repair type:    Repair type:  Simple Pre-procedure details:    Preparation:  Patient was prepped and draped in usual sterile fashion and imaging obtained to evaluate for foreign bodies Exploration:    Hemostasis achieved with:  Direct pressure   Wound exploration: wound explored through full range of motion and entire depth of wound probed and visualized     Wound extent: no foreign bodies/material noted and no underlying fracture noted     Contaminated: no   Treatment:    Area cleansed with:  Betadine and saline   Amount of cleaning:  Standard   Irrigation solution:  Sterile saline    Irrigation volume:  250   Irrigation method:  Pressure wash   Visualized foreign bodies/material removed: no   Skin repair:    Repair method:  Staples   Number of staples:  3 Approximation:    Approximation:  Close   Vermilion border: well-aligned   Post-procedure details:    Dressing:  Open (no dressing)   Patient tolerance of procedure:  Tolerated well, no immediate complications  .Marland KitchenLaceration Repair Date/Time: 10/14/2017 5:32 PM Performed by: Domenic Moras, PA-C Authorized by: Domenic Moras, PA-C   Consent:    Consent obtained:  Verbal   Consent given by:  Patient   Risks discussed:  Poor wound healing   Alternatives discussed:  No treatment Anesthesia (see MAR for exact dosages):    Anesthesia method:  None Laceration details:    Location: forehead, left side.   Length (cm):  1   Depth (mm):  2 Repair type:    Repair type:  Simple Pre-procedure details:    Preparation:  Patient was prepped and draped in usual sterile fashion and imaging obtained to evaluate for foreign bodies Exploration:    Hemostasis achieved with:  LET   Wound exploration: wound explored through full range of motion     Wound extent: no underlying fracture noted     Contaminated: no   Treatment:    Area cleansed with:  Saline   Amount of cleaning:  Standard   Irrigation solution:  Sterile saline   Irrigation volume:  50   Irrigation method:  Pressure wash   Visualized foreign bodies/material removed: no   Skin repair:    Repair method:  Steri-Strips   Number of Steri-Strips:  1 Approximation:    Approximation:  Close   Vermilion border: well-aligned   Post-procedure details:    Dressing:  Open (no dressing)   Patient tolerance of procedure:  Tolerated well, no immediate complications   (including critical care time)  Medications Ordered in ED Medications  lidocaine-EPINEPHrine-tetracaine (LET) solution (3 mLs Topical Given 10/14/17 1650)     Initial Impression / Assessment and Plan / ED  Course  I have reviewed the triage vital signs and the nursing notes.  Pertinent labs & imaging results that were available during my care of the patient were reviewed by me and considered in my medical decision making (see chart for details).     BP (!) 113/53 (BP Location: Right Arm)   Pulse 83   Temp 98 F (36.7 C) (Oral)  Resp 20   Wt 73 kg (161 lb)   SpO2 96%   BMI 23.78 kg/m    Final Clinical Impressions(s) / ED Diagnoses   Final diagnoses:  Laceration of scalp, initial encounter  Minor head injury, initial encounter    ED Discharge Orders    None     4:34 PM Elderly male here with a mechanical fall and injuring his scalp.  He has a laceration to his right parietal scalp, and skin tear to his left upper forehead.  No other injury noted.  He is alert and oriented x4 and in no acute discomfort.  No precipitating symptoms prior to the fall.  He is not any blood thinner medication.  He is however elderly, and at risk for significant injury therefore my plan is to obtain head and cervical spine CT, check EKG, and UA.  Scalp wound will need to be irrigated and surgically stable as indicated.  5:08 PM Fortunately head and cervical spine CT is without acute intracranial injury or acute fracture. EKG without concerning changes.  Scalp wound was irrigated and surgically staples and sterile strip as appropriate.  Care discussed with DR. Cardama. UA neg for infection.  Recommend staples removal in 5-7 days.  Wound care instruction given.  Return precaution given.    Domenic Moras, PA-C 10/14/17 1752

## 2017-10-14 NOTE — ED Triage Notes (Signed)
Patient fell at home with multiple lacerations to his head. Bleeding controlled. Denies any LOC

## 2017-10-14 NOTE — Discharge Instructions (Signed)
You have been evaluated for your head injury.  Fortunately no broken bones of blood in your brain.  Your urine did not show any evidence of infection.  Please take tylenol or ibuprofen as needed at home for pain.  Avoid washing hair for the first 24 hrs. Then you may wash with gentle soap/water and apply neosporin over the wound to prevent infection.  Follow up with your doctor in 5-7 days for staples removal. Monitor for sign of infection and return if you have any concerns.

## 2017-10-15 DIAGNOSIS — I5030 Unspecified diastolic (congestive) heart failure: Secondary | ICD-10-CM | POA: Diagnosis not present

## 2017-10-15 DIAGNOSIS — Z9981 Dependence on supplemental oxygen: Secondary | ICD-10-CM | POA: Diagnosis not present

## 2017-10-15 DIAGNOSIS — Z85828 Personal history of other malignant neoplasm of skin: Secondary | ICD-10-CM | POA: Diagnosis not present

## 2017-10-15 DIAGNOSIS — N183 Chronic kidney disease, stage 3 (moderate): Secondary | ICD-10-CM | POA: Diagnosis not present

## 2017-10-15 DIAGNOSIS — R1312 Dysphagia, oropharyngeal phase: Secondary | ICD-10-CM | POA: Diagnosis not present

## 2017-10-15 DIAGNOSIS — I87322 Chronic venous hypertension (idiopathic) with inflammation of left lower extremity: Secondary | ICD-10-CM | POA: Diagnosis not present

## 2017-10-15 DIAGNOSIS — J209 Acute bronchitis, unspecified: Secondary | ICD-10-CM | POA: Diagnosis not present

## 2017-10-15 DIAGNOSIS — I129 Hypertensive chronic kidney disease with stage 1 through stage 4 chronic kidney disease, or unspecified chronic kidney disease: Secondary | ICD-10-CM | POA: Diagnosis not present

## 2017-10-15 DIAGNOSIS — L97321 Non-pressure chronic ulcer of left ankle limited to breakdown of skin: Secondary | ICD-10-CM | POA: Diagnosis not present

## 2017-10-15 DIAGNOSIS — Z87891 Personal history of nicotine dependence: Secondary | ICD-10-CM | POA: Diagnosis not present

## 2017-10-16 ENCOUNTER — Ambulatory Visit (INDEPENDENT_AMBULATORY_CARE_PROVIDER_SITE_OTHER): Payer: Medicare Other | Admitting: Family Medicine

## 2017-10-16 ENCOUNTER — Encounter: Payer: Self-pay | Admitting: Family Medicine

## 2017-10-16 DIAGNOSIS — I700 Atherosclerosis of aorta: Secondary | ICD-10-CM | POA: Diagnosis not present

## 2017-10-16 DIAGNOSIS — N3281 Overactive bladder: Secondary | ICD-10-CM

## 2017-10-16 DIAGNOSIS — I5030 Unspecified diastolic (congestive) heart failure: Secondary | ICD-10-CM

## 2017-10-16 DIAGNOSIS — I712 Thoracic aortic aneurysm, without rupture: Secondary | ICD-10-CM

## 2017-10-16 DIAGNOSIS — J42 Unspecified chronic bronchitis: Secondary | ICD-10-CM | POA: Diagnosis not present

## 2017-10-16 DIAGNOSIS — I7121 Aneurysm of the ascending aorta, without rupture: Secondary | ICD-10-CM

## 2017-10-16 DIAGNOSIS — J9611 Chronic respiratory failure with hypoxia: Secondary | ICD-10-CM | POA: Diagnosis not present

## 2017-10-16 DIAGNOSIS — J40 Bronchitis, not specified as acute or chronic: Secondary | ICD-10-CM | POA: Diagnosis not present

## 2017-10-16 DIAGNOSIS — S0101XA Laceration without foreign body of scalp, initial encounter: Secondary | ICD-10-CM | POA: Diagnosis not present

## 2017-10-16 NOTE — Progress Notes (Signed)
Subjective:  Roger [name] is a 84 y.o. year old very pleasant male patient who presents for/with See problem oriented charting ROS- main complaint is frequent urination of oxybutynin , edema better. No shortness of breath. No fever or chills. No expanding redness on scalp  Past Medical History-  Patient Active Problem List   Diagnosis Date Noted  . Ascending aortic aneurysm ([place]) 10/17/2017    Priority: High  . Skin ulcer of left ankle, limited to breakdown of skin ([place]) 10/16/2016    Priority: High  . Diastolic CHF ([place]) 69/62/9528    Priority: High  . Overactive bladder 07/13/2014    Priority: High  . Allergic conjunctivitis of both eyes 06/22/2017    Priority: Medium  . Depression, major, single episode, complete remission ([place]) 07/05/2015    Priority: Medium  . History of pneumonia 03/10/2015    Priority: Medium  . Chronic throat clearing 10/11/2014    Priority: Medium  . CKD (chronic kidney disease), stage III ([place]) 07/13/2014    Priority: Medium  . Syncope 11/17/2013    Priority: Medium  . Hypertension 06/17/2007    Priority: Medium  . Hyperlipidemia 02/22/2007    Priority: Medium  . Venous (peripheral) insufficiency 01/26/2015    Priority: Low  . Macular degeneration 06/19/2011    Priority: Low  . Osteoarthritis 06/05/2009    Priority: Low  . CARCINOMA, SKIN, SQUAMOUS CELL 05/26/2008    Priority: Low  . GERD (gastroesophageal reflux disease) 02/22/2007    Priority: Low  . Laceration of scalp 10/17/2017  . Aortic atherosclerosis ([place]) 10/17/2017  . Bronchitis 08/29/2017  . Idiopathic chronic venous hypertension of left lower extremity with ulcer and inflammation ([place]) 10/22/2016  . Onychomycosis 10/22/2016    Medications- reviewed and updated Current Outpatient Medications  Medication Sig Dispense Refill  . albuterol (PROVENTIL HFA;VENTOLIN HFA) 108 (90 Base) MCG/ACT inhaler Inhale 2 puffs into the lungs every 6 (six) hours as needed for wheezing or  shortness of breath. 1 Inhaler 0  . albuterol (PROVENTIL) (2.5 MG/3ML) 0.083% nebulizer solution Take 3 mLs (2.5 mg total) by nebulization every 6 (six) hours as needed for wheezing or shortness of breath. 150 mL 1  . aspirin (ECOTRIN) 325 MG EC tablet Take 325 mg by mouth daily.    . fluticasone (FLONASE) 50 MCG/ACT nasal spray Place 2 sprays into both nostrils daily. 16 g 6  . furosemide (LASIX) 20 MG tablet  Trial off after visit Take 1 tablet (20 mg total) daily by mouth. 90 tablet 1  . guaiFENesin (MUCINEX) 600 MG 12 hr tablet Take 2 tablets (1,200 mg total) by mouth 2 (two) times daily. 60 tablet 0  . lovastatin (MEVACOR) 40 MG tablet Take 1 tablet (40 mg total) by mouth daily. 90 tablet 3  . Multiple Vitamins-Minerals (MULTIVITAMIN WITH MINERALS) tablet Take 1 tablet by mouth daily.    [name] omeprazole (PRILOSEC) 20 MG capsule TAKE ONE CAPSULE BY MOUTH TWICE A DAY BEFORE A MEAL 180 capsule 2  . sertraline (ZOLOFT) 25 MG tablet Take 1 tablet (25 mg total) by mouth daily. 90 tablet 3  . UNABLE TO FIND Med Name: Med pass 120 mL by mouth twice daily for supplement     No current facility-administered medications for this visit.     Objective: BP 138/70 (BP Location: Left Arm, Patient Position: Sitting, Cuff Size: Large)   Pulse 78   Temp (!) 97.3 F (36.3 C) (Oral)   Ht 5\' 9"  (1.753 m)   Wt 173  lb 6.4 oz (78.7 kg)   SpO2 96%   BMI 25.61 kg/m  Gen: NAD, resting comfortably CV: RRR no murmurs rubs or gallops Lungs: CTAB no crackles, wheeze, rhonchi Abdomen: soft/nontender/nondistended/normal bowel sounds. No rebound or guarding.  Ext: 1+ edema Skin: warm, dry Neuro: in wheelchair- assisted by aide at end of visit  Assessment/Plan:   Laceration of scalp S: 3 staples placed on 10/14/17 with plan 5-7 day removal. They (husband and wife) were instructed 7 day follow up with me. They were instructed to wash hair 24 hours after staple placement. Today is day 2 and they thought they could  not wash hair. Also they thought we were removing staples today A/P: Instructed on follow up for next week to remove staples. Advised wash hair as per ED AVS- I cannot even see the staples at this point due to dried blood  Respiratory failure, chronic ([place]) Has come off oxygen at this point and maintaining saturations at day and night above 94%- will remain off oxygen- they will call home health for pick up  Diastolic CHF ([facility]) Trial off lasix 20mg . See OAB section  Overactive bladder S: Patient is really bothered by frequent urination since coming off oxybutynin (removed this to try to reduce fall risk as well as fact patient was complaining of symptoms of retained urine). Later still had mechanical fall unfortunately and sustained scalp laceration as noted in this note.  A/P:Complex issue here- making any changes are risky in patient that has had a hard time processing changes in past (for example last visit tried to stop lasix instead of oxybutynin despite multiple reinforcements with him and daughter (daughter not present today making this more concerning). Patient also hard of hearing. Wife with history of stroke- and seems to need even more repetition than husband - BP barely controlled and stopping lasix may increase BP (though could consider other agent) - venous insufficiency but not using his compression garments as instructed. Luckily swelling only 1+ today and he agrees to work on elevation - could add myrbetriq but may increase BP - in the end after a prolonged discussion we opted to stop lasix 20mg  with close follow up next week.  - to start lasix back if weight 3 lbs in a day or 5 lbs in 3 days, increased edema, shortness of breath - may end up going back on lasix and adding myrbetriq at next visit (will have to watch BP) - if that raises BP too much- may simply have to go back to lasix/oxybutynin combo- watch for retention symptoms if we do that as well as falls/confusion as  anticholinergic  - may need rectal exam- possibly retention issues from BPH? Finasteride likely only reasonable option there - discussed urology refer but would likely take 2 months and they prefer not to have more specialists involved at present  Ascending aortic aneurysm ([place]) Noted on CT 10/12/17. 4.3 CM. Need repeat in 6-12 months (likely 6). Would prefer tighter BP control but may increase fall risk.   Aortic atherosclerosis ([place]) Noted on ct. On lovastatin for lipids, trying to monitor BP closely  Advised follow up on 26th or 27th- encouraged them to schedule before leaving  Time Stamp The duration of face-to-face time during this visit was greater than 40 minutes (12 21-1:01 PM- printed avs 12:59 but then reviewed in person in detail and reinforced). Greater than 50% of this time was spent in counseling, explanation of diagnosis, planning of further management, and/or coordination of care including  complex decision making as discussed above- trying to make a joint decision - needing to repeat conversation several times for both husband and wife.    Return precautions advised.  Garret Reddish, MD

## 2017-10-16 NOTE — Patient Instructions (Signed)
Stay off oxybutynin  Elevate legs as much as you can. Wear your compression stockings to help keep fluid out of legs  STOP lasix for now.   You have to restart lasix if 1. Swelling worsens 2. Weight goes up 3 lbs in a day or 5 lbs in 3 days (you must weigh daily)       We are doing this to see if we can help you pee less without adding medicine to your regimen. If this doesn't work, we will consider adding myrbetriq in addition to the lasix. If your blood pressure goes up we may have to add the lasix back in anyway.

## 2017-10-17 DIAGNOSIS — I712 Thoracic aortic aneurysm, without rupture: Secondary | ICD-10-CM | POA: Insufficient documentation

## 2017-10-17 DIAGNOSIS — I700 Atherosclerosis of aorta: Secondary | ICD-10-CM | POA: Insufficient documentation

## 2017-10-17 DIAGNOSIS — S0101XA Laceration without foreign body of scalp, initial encounter: Secondary | ICD-10-CM | POA: Insufficient documentation

## 2017-10-17 DIAGNOSIS — I7121 Aneurysm of the ascending aorta, without rupture: Secondary | ICD-10-CM | POA: Insufficient documentation

## 2017-10-17 NOTE — Assessment & Plan Note (Addendum)
S: Patient is really bothered by frequent urination since coming off oxybutynin (removed this to try to reduce fall risk as well as fact patient was complaining of symptoms of retained urine). Later still had mechanical fall unfortunately and sustained scalp laceration as noted in this note.  A/P:Complex issue here- making any changes are risky in patient that has had a hard time processing changes in past (for example last visit tried to stop lasix instead of oxybutynin despite multiple reinforcements with him and daughter (daughter not present today making this more concerning). Patient also hard of hearing. Wife with history of stroke- and seems to need even more repetition than husband - BP barely controlled and stopping lasix may increase BP (though could consider other agent) - venous insufficiency but not using his compression garments as instructed. Luckily swelling only 1+ today and he agrees to work on elevation - could add myrbetriq but may increase BP - in the end after a prolonged discussion we opted to stop lasix 20mg  with close follow up next week.  - to start lasix back if weight 3 lbs in a day or 5 lbs in 3 days, increased edema, shortness of breath - may end up going back on lasix and adding myrbetriq at next visit (will have to watch BP) - if that raises BP too much- may simply have to go back to lasix/oxybutynin combo- watch for retention symptoms if we do that as well as falls/confusion as anticholinergic  - may need rectal exam- possibly retention issues from BPH? Finasteride likely only reasonable option there - discussed urology refer but would likely take 2 months and they prefer not to have more specialists involved at present

## 2017-10-17 NOTE — Assessment & Plan Note (Signed)
Trial off lasix 20mg . See OAB section

## 2017-10-17 NOTE — Assessment & Plan Note (Signed)
Noted on ct. On lovastatin for lipids, trying to monitor BP closely

## 2017-10-17 NOTE — Assessment & Plan Note (Signed)
S: 3 staples placed on 10/14/17 with plan 5-7 day removal. They (husband and wife) were instructed 7 day follow up with me. They were instructed to wash hair 24 hours after staple placement. Today is day 2 and they thought they could not wash hair. Also they thought we were removing staples today A/P: Instructed on follow up for next week to remove staples. Advised wash hair as per ED AVS- I cannot even see the staples at this point due to dried blood

## 2017-10-17 NOTE — Assessment & Plan Note (Signed)
Noted on CT 10/12/17. 4.3 CM. Need repeat in 6-12 months (likely 6). Would prefer tighter BP control but may increase fall risk.

## 2017-10-17 NOTE — Assessment & Plan Note (Signed)
Has come off oxygen at this point and maintaining saturations at day and night above 94%- will remain off oxygen- they will call home health for pick up

## 2017-10-19 ENCOUNTER — Telehealth: Payer: Self-pay | Admitting: Family Medicine

## 2017-10-19 NOTE — Telephone Encounter (Signed)
See note

## 2017-10-19 NOTE — Telephone Encounter (Signed)
Copied from [place] [id number]. Topic: Quick Communication - See Telephone Encounter >> Oct 19, 2017 11:20 AM Bea Graff, NT wrote: CRM for notification. See Telephone encounter for: Pts daughter calling and she went to [place] to try to get the oxygen picked up and returned that her dad no longer needs. [facility] is needing a d/c note from the dr. Pts daughter wants to pick this up as soon as possible. Please advise.   10/19/17.

## 2017-10-20 DIAGNOSIS — Z9981 Dependence on supplemental oxygen: Secondary | ICD-10-CM | POA: Diagnosis not present

## 2017-10-20 DIAGNOSIS — R1312 Dysphagia, oropharyngeal phase: Secondary | ICD-10-CM | POA: Diagnosis not present

## 2017-10-20 DIAGNOSIS — I87322 Chronic venous hypertension (idiopathic) with inflammation of left lower extremity: Secondary | ICD-10-CM | POA: Diagnosis not present

## 2017-10-20 DIAGNOSIS — N183 Chronic kidney disease, stage 3 (moderate): Secondary | ICD-10-CM | POA: Diagnosis not present

## 2017-10-20 DIAGNOSIS — I129 Hypertensive chronic kidney disease with stage 1 through stage 4 chronic kidney disease, or unspecified chronic kidney disease: Secondary | ICD-10-CM | POA: Diagnosis not present

## 2017-10-20 DIAGNOSIS — Z85828 Personal history of other malignant neoplasm of skin: Secondary | ICD-10-CM | POA: Diagnosis not present

## 2017-10-20 DIAGNOSIS — L97321 Non-pressure chronic ulcer of left ankle limited to breakdown of skin: Secondary | ICD-10-CM | POA: Diagnosis not present

## 2017-10-20 DIAGNOSIS — Z87891 Personal history of nicotine dependence: Secondary | ICD-10-CM | POA: Diagnosis not present

## 2017-10-20 DIAGNOSIS — I5030 Unspecified diastolic (congestive) heart failure: Secondary | ICD-10-CM | POA: Diagnosis not present

## 2017-10-20 DIAGNOSIS — J209 Acute bronchitis, unspecified: Secondary | ICD-10-CM | POA: Diagnosis not present

## 2017-10-21 ENCOUNTER — Telehealth: Payer: Self-pay | Admitting: Family Medicine

## 2017-10-21 NOTE — Telephone Encounter (Signed)
Copied from [place] [id number]. Topic: Quick Communication - See Telephone Encounter >> Oct 21, 2017  3:34 PM Vernona Rieger wrote: CRM for notification. See Telephone encounter for:   10/21/17.  [place] physical therapist from [facility] needs verbal orders to continue therapy for once this week & once next week.  Call back [phone]

## 2017-10-21 NOTE — Telephone Encounter (Signed)
See note

## 2017-10-21 NOTE — Telephone Encounter (Signed)
Called and spoke with [place]. We discussed sutures/staples in head. Home Health aide is only out to the house twice a week and they give him a bath when they are there. They think there are sutures in his head.  We discussed medication changes. The family had reported to her that there were no changes regarding medications.  I reviewed over with Langley Gauss the following:  Laceration of scalp S: 3 staples placed on 10/14/17 with plan 5-7 day removal. They (husband and wife) were instructed 7 day follow up with me. They were instructed to wash hair 24 hours after staple placement. Today is day 2 and they thought they could not wash hair. Also they thought we were removing staples today A/P: Instructed on follow up for next week to remove staples. Advised wash hair as per ED AVS- I cannot even see the staples at this point due to dried blood  Respiratory failure, chronic ([place]) Has come off oxygen at this point and maintaining saturations at day and night above 94%- will remain off oxygen- they will call home health for pick up  Diastolic CHF (HCC) Trial off lasix 20mg . See OAB section  Overactive bladder S: Patient is really bothered by frequent urination since coming off oxybutynin (removed this to try to reduce fall risk as well as fact patient was complaining of symptoms of retained urine). Later still had mechanical fall unfortunately and sustained scalp laceration as noted in this note.  A/P:Complex issue here- making any changes are risky in patient that has had a hard time processing changes in past (for example last visit tried to stop lasix instead of oxybutynin despite multiple reinforcements with him and daughter (daughter not present today making this more concerning). Patient also hard of hearing. Wife with history of stroke- and seems to need even more repetition than husband - BP barely controlled and stopping lasix may increase BP (though could consider other agent) - venous  insufficiency but not using his compression garments as instructed. Luckily swelling only 1+ today and he agrees to work on elevation - could add myrbetriq but may increase BP - in the end after a prolonged discussion we opted to stop lasix 20mg  with close follow up next week.  - to start lasix back if weight 3 lbs in a day or 5 lbs in 3 days, increased edema, shortness of breath - may end up going back on lasix and adding myrbetriq at next visit (will have to watch BP) - if that raises BP too much- may simply have to go back to lasix/oxybutynin combo- watch for retention symptoms if we do that as well as falls/confusion as anticholinergic  - may need rectal exam- possibly retention issues from BPH? Finasteride likely only reasonable option there - discussed urology refer but would likely take 2 months and they prefer not to have more specialists involved at present   She is going to contact the therapist to check and see if the Lasix is in the pill box.   Langley Gauss did say that as far as edema she noted it to be 1+on the left and 2+ on the right also on 10/20/17. Langley Gauss also stated that the patient does not have a scale at home for weight check. Patient did report to Langley Gauss that he is still taking Lasix.

## 2017-10-21 NOTE — Telephone Encounter (Signed)
Called and left a voicemail message to let her know that the letter she requested is at the front desk for her to pick up at her convenience

## 2017-10-21 NOTE — Telephone Encounter (Signed)
He was supposed to see [place] today or tomorrow- he is going to need to get on someones schedule. Can home health even take the staples out? There are 3

## 2017-10-21 NOTE — Telephone Encounter (Signed)
Patient is on Dr. Marigene Ehlers schedule for 10/23/2017

## 2017-10-21 NOTE — Telephone Encounter (Signed)
Perfect thanks

## 2017-10-21 NOTE — Telephone Encounter (Signed)
Patient's wife stated that the oxygen was picked up yesterday, 10/20/17.

## 2017-10-22 ENCOUNTER — Telehealth: Payer: Self-pay | Admitting: Family Medicine

## 2017-10-22 DIAGNOSIS — I87322 Chronic venous hypertension (idiopathic) with inflammation of left lower extremity: Secondary | ICD-10-CM | POA: Diagnosis not present

## 2017-10-22 DIAGNOSIS — J209 Acute bronchitis, unspecified: Secondary | ICD-10-CM | POA: Diagnosis not present

## 2017-10-22 DIAGNOSIS — R1312 Dysphagia, oropharyngeal phase: Secondary | ICD-10-CM | POA: Diagnosis not present

## 2017-10-22 DIAGNOSIS — Z9981 Dependence on supplemental oxygen: Secondary | ICD-10-CM | POA: Diagnosis not present

## 2017-10-22 DIAGNOSIS — Z85828 Personal history of other malignant neoplasm of skin: Secondary | ICD-10-CM | POA: Diagnosis not present

## 2017-10-22 DIAGNOSIS — I5030 Unspecified diastolic (congestive) heart failure: Secondary | ICD-10-CM | POA: Diagnosis not present

## 2017-10-22 DIAGNOSIS — Z87891 Personal history of nicotine dependence: Secondary | ICD-10-CM | POA: Diagnosis not present

## 2017-10-22 DIAGNOSIS — L97321 Non-pressure chronic ulcer of left ankle limited to breakdown of skin: Secondary | ICD-10-CM | POA: Diagnosis not present

## 2017-10-22 DIAGNOSIS — N183 Chronic kidney disease, stage 3 (moderate): Secondary | ICD-10-CM | POA: Diagnosis not present

## 2017-10-22 DIAGNOSIS — I129 Hypertensive chronic kidney disease with stage 1 through stage 4 chronic kidney disease, or unspecified chronic kidney disease: Secondary | ICD-10-CM | POA: Diagnosis not present

## 2017-10-22 NOTE — Telephone Encounter (Signed)
Called and left a voicemail message providing verbal orders as requested. I did provide a call back number for questions

## 2017-10-22 NOTE — Telephone Encounter (Signed)
See note

## 2017-10-22 NOTE — Telephone Encounter (Signed)
Copied from [place]. Topic: Quick Communication - See Telephone Encounter >> Oct 22, 2017 12:21 PM Boyd Kerbs wrote: CRM for notification. See Telephone encounter for:   Having chair lift installed Monday am and [employer] is doing the installation. If they have a prescription they can have no sales taxes, this is for his arthritis [name], daughter is asking if can pick this up tomorrow at his appt.   She was told if Yong Channel is out to ask for Dr. Jerline Pain  This is needed for tomorrow appt  10/22/17.

## 2017-10-22 NOTE — Telephone Encounter (Signed)
See patient's request below

## 2017-10-23 ENCOUNTER — Other Ambulatory Visit: Payer: Self-pay

## 2017-10-23 ENCOUNTER — Encounter: Payer: Self-pay | Admitting: Family Medicine

## 2017-10-23 ENCOUNTER — Ambulatory Visit (INDEPENDENT_AMBULATORY_CARE_PROVIDER_SITE_OTHER): Payer: Medicare Other | Admitting: Family Medicine

## 2017-10-23 VITALS — BP 130/82 | HR 86 | Temp 97.5°F | Ht 69.0 in | Wt 167.0 lb

## 2017-10-23 DIAGNOSIS — Z4802 Encounter for removal of sutures: Secondary | ICD-10-CM

## 2017-10-23 DIAGNOSIS — N3281 Overactive bladder: Secondary | ICD-10-CM

## 2017-10-23 NOTE — Telephone Encounter (Signed)
Patient is coming in today for a visit with Dr. Jerline Pain.

## 2017-10-23 NOTE — Progress Notes (Signed)
   Subjective:  Roger [name] is a 84 y.o. male who presents today with a chief complaint of encounter for staple removal.   HPI:  Scalp laceration, established problem, stable Patient seen by his PCP about a week ago for this.  He had 3 staples placed on 10/14/2017.  They were instructed to follow-up within a week to have staple removal.  They were unable to be removed at follow-up with his PCP as it was too early.  He has done well since then.  No fevers or chills.  No pain to the area.  No drainage.  Overactive bladder, established problem, stable Patient also seen by his PCP for this about a week ago.  Patient stopped his Lasix for a few days however had significant lower extremity swelling and restarted his Lasix.  Weight has been stable over last few days.  Patient is disturbed because he has frequent urination.  No lower abdominal pain.  ROS: Per HPI  Objective:  Physical Exam: BP 130/82 (BP Location: Left Arm, Patient Position: Sitting, Cuff Size: Normal)   Pulse 86   Temp (!) 97.5 F (36.4 C) (Oral)   Ht 5\' 9"  (1.753 m)   Wt 167 lb (75.8 kg)   SpO2 94%   BMI 24.66 kg/m    Scalp: Approximately 3 cm laceration to right parietal scalp.  Will healed.  No surrounding erythema or drainage. Abdomen: Soft, nontender, nondistended.  Bladder not able to be palpated. Extremities: No lower extremity edema.  Patient's 3 staples were removed with staple removal tool. Patient tolerated well.  Assessment/Plan:  Encounter for staple removal Successfully removed today.  See above note.  Overactive bladder No signs of urinary retention today.  Had a recent UA which was normal.  Discussed treatment options with patient.  Likely exacerbated by his Lasix.  His weight is stable and is not have any signs of volume overload either.  Patient will continue his current regimen of Lasix 20 mg daily.  We will not start oxybutynin today.  Patient will follow-up with his PCP within the next few weeks.   Discussed reasons to return to care and signs to watch out for regarding urinary retention.  Algis Greenhouse. Jerline Pain, MD 10/23/2017 1:46 PM

## 2017-10-24 DIAGNOSIS — Z9981 Dependence on supplemental oxygen: Secondary | ICD-10-CM | POA: Diagnosis not present

## 2017-10-24 DIAGNOSIS — I5030 Unspecified diastolic (congestive) heart failure: Secondary | ICD-10-CM | POA: Diagnosis not present

## 2017-10-24 DIAGNOSIS — J209 Acute bronchitis, unspecified: Secondary | ICD-10-CM | POA: Diagnosis not present

## 2017-10-24 DIAGNOSIS — I87322 Chronic venous hypertension (idiopathic) with inflammation of left lower extremity: Secondary | ICD-10-CM | POA: Diagnosis not present

## 2017-10-24 DIAGNOSIS — N183 Chronic kidney disease, stage 3 (moderate): Secondary | ICD-10-CM | POA: Diagnosis not present

## 2017-10-24 DIAGNOSIS — Z87891 Personal history of nicotine dependence: Secondary | ICD-10-CM | POA: Diagnosis not present

## 2017-10-24 DIAGNOSIS — Z85828 Personal history of other malignant neoplasm of skin: Secondary | ICD-10-CM | POA: Diagnosis not present

## 2017-10-24 DIAGNOSIS — R1312 Dysphagia, oropharyngeal phase: Secondary | ICD-10-CM | POA: Diagnosis not present

## 2017-10-24 DIAGNOSIS — I129 Hypertensive chronic kidney disease with stage 1 through stage 4 chronic kidney disease, or unspecified chronic kidney disease: Secondary | ICD-10-CM | POA: Diagnosis not present

## 2017-10-24 DIAGNOSIS — L97321 Non-pressure chronic ulcer of left ankle limited to breakdown of skin: Secondary | ICD-10-CM | POA: Diagnosis not present

## 2017-10-26 ENCOUNTER — Telehealth: Payer: Self-pay | Admitting: Family Medicine

## 2017-10-26 NOTE — Telephone Encounter (Signed)
See note

## 2017-10-26 NOTE — Telephone Encounter (Signed)
Copied from [place]. Topic: Quick Communication - See Telephone Encounter >> Oct 26, 2017  3:04 PM Lolita Rieger, RMA wrote: CRM for notification. See Telephone encounter for:   10/26/17.Langley Gauss form [facility] called and wanted to know if she be discharging pt tomorrow please call her @[phone] 

## 2017-10-27 DIAGNOSIS — L97321 Non-pressure chronic ulcer of left ankle limited to breakdown of skin: Secondary | ICD-10-CM | POA: Diagnosis not present

## 2017-10-27 DIAGNOSIS — Z85828 Personal history of other malignant neoplasm of skin: Secondary | ICD-10-CM | POA: Diagnosis not present

## 2017-10-27 DIAGNOSIS — Z87891 Personal history of nicotine dependence: Secondary | ICD-10-CM | POA: Diagnosis not present

## 2017-10-27 DIAGNOSIS — Z9981 Dependence on supplemental oxygen: Secondary | ICD-10-CM | POA: Diagnosis not present

## 2017-10-27 DIAGNOSIS — I5030 Unspecified diastolic (congestive) heart failure: Secondary | ICD-10-CM | POA: Diagnosis not present

## 2017-10-27 DIAGNOSIS — I129 Hypertensive chronic kidney disease with stage 1 through stage 4 chronic kidney disease, or unspecified chronic kidney disease: Secondary | ICD-10-CM | POA: Diagnosis not present

## 2017-10-27 DIAGNOSIS — N183 Chronic kidney disease, stage 3 (moderate): Secondary | ICD-10-CM | POA: Diagnosis not present

## 2017-10-27 DIAGNOSIS — R1312 Dysphagia, oropharyngeal phase: Secondary | ICD-10-CM | POA: Diagnosis not present

## 2017-10-27 DIAGNOSIS — J209 Acute bronchitis, unspecified: Secondary | ICD-10-CM | POA: Diagnosis not present

## 2017-10-27 DIAGNOSIS — I87322 Chronic venous hypertension (idiopathic) with inflammation of left lower extremity: Secondary | ICD-10-CM | POA: Diagnosis not present

## 2017-10-27 NOTE — Telephone Encounter (Signed)
Spoke with Langley Gauss and she will make determination when she goes out tomorrow for Home visit. She will call office and notify of her findings

## 2017-10-27 NOTE — Telephone Encounter (Signed)
Copied from [place]. Topic: Quick Communication - See Telephone Encounter >> Oct 26, 2017  3:04 PM Lolita Rieger, RMA wrote: CRM for notification. See Telephone encounter for:   10/26/17.Langley Gauss form [facility] called and wanted to know if she be discharging pt tomorrow please call her @[phone]  >> Oct 27, 2017  4:08 PM Lolita Rieger, [place] wrote: Langley Gauss is scheduled to see pt to discharge pt and she wanted to know if it was ok with Dr. Yong Channel first, she was supposed to discharge today but rescheduled her appt until she can hear back from the office

## 2017-10-27 NOTE — Telephone Encounter (Signed)
See note.   Copied from [place]. Topic: Quick Communication - See Telephone Encounter >> Oct 26, 2017  3:04 PM Lolita Rieger, RMA wrote: CRM for notification. See Telephone encounter for:   10/26/17.Langley Gauss form [facility] called and wanted to know if she be discharging pt tomorrow please call her @[phone]  >> Oct 27, 2017  4:08 PM Lolita Rieger, [place] wrote: Langley Gauss is scheduled to see pt to discharge pt and she wanted to know if it was ok with Dr. Yong Channel first, she was supposed to discharge today but rescheduled her appt until she can hear back from the office

## 2017-10-28 DIAGNOSIS — Z87891 Personal history of nicotine dependence: Secondary | ICD-10-CM | POA: Diagnosis not present

## 2017-10-28 DIAGNOSIS — L97321 Non-pressure chronic ulcer of left ankle limited to breakdown of skin: Secondary | ICD-10-CM | POA: Diagnosis not present

## 2017-10-28 DIAGNOSIS — J209 Acute bronchitis, unspecified: Secondary | ICD-10-CM | POA: Diagnosis not present

## 2017-10-28 DIAGNOSIS — I87322 Chronic venous hypertension (idiopathic) with inflammation of left lower extremity: Secondary | ICD-10-CM | POA: Diagnosis not present

## 2017-10-28 DIAGNOSIS — Z85828 Personal history of other malignant neoplasm of skin: Secondary | ICD-10-CM | POA: Diagnosis not present

## 2017-10-28 DIAGNOSIS — I5030 Unspecified diastolic (congestive) heart failure: Secondary | ICD-10-CM | POA: Diagnosis not present

## 2017-10-28 DIAGNOSIS — Z9981 Dependence on supplemental oxygen: Secondary | ICD-10-CM | POA: Diagnosis not present

## 2017-10-28 DIAGNOSIS — R1312 Dysphagia, oropharyngeal phase: Secondary | ICD-10-CM | POA: Diagnosis not present

## 2017-10-28 DIAGNOSIS — I129 Hypertensive chronic kidney disease with stage 1 through stage 4 chronic kidney disease, or unspecified chronic kidney disease: Secondary | ICD-10-CM | POA: Diagnosis not present

## 2017-10-28 DIAGNOSIS — N183 Chronic kidney disease, stage 3 (moderate): Secondary | ICD-10-CM | POA: Diagnosis not present

## 2017-10-28 NOTE — Telephone Encounter (Signed)
Copied from [place] [id number]. Topic: Quick Communication - See Telephone Encounter >> Oct 28, 2017  4:22 PM Neva Seat wrote: Langley Gauss [place]  Pt was discharged from nursing and pt today.  He is doing great.

## 2017-10-28 NOTE — Telephone Encounter (Signed)
See note

## 2017-10-29 NOTE — Telephone Encounter (Signed)
Noted  

## 2017-11-03 ENCOUNTER — Telehealth: Payer: Self-pay | Admitting: Family Medicine

## 2017-11-03 NOTE — Telephone Encounter (Signed)
Copied from [place]. Topic: Quick Communication - Rx Refill/Question >> Nov 03, 2017  3:32 PM Wynetta Emery, [place] C wrote:   Medication: omeprazole (PRILOSEC) 20 MG capsule- 90 days supply    Has the patient contacted their pharmacy? yes   (Agent: If no, request that the patient contact the pharmacy for the refill.)   Preferred Pharmacy (with phone number or street name): [id number] - [place], [place] - [address]   Agent: Please be advised that RX refills may take up to 3 business days. We ask that you follow-up with your pharmacy.

## 2017-11-04 ENCOUNTER — Other Ambulatory Visit: Payer: Self-pay | Admitting: *Deleted

## 2017-11-04 MED ORDER — OMEPRAZOLE 20 MG PO CPDR: DELAYED_RELEASE_CAPSULE | ORAL | 2 refills | Status: DC

## 2017-11-13 DIAGNOSIS — J40 Bronchitis, not specified as acute or chronic: Secondary | ICD-10-CM | POA: Diagnosis not present

## 2017-11-13 DIAGNOSIS — J42 Unspecified chronic bronchitis: Secondary | ICD-10-CM | POA: Diagnosis not present

## 2017-12-14 DIAGNOSIS — J42 Unspecified chronic bronchitis: Secondary | ICD-10-CM | POA: Diagnosis not present

## 2017-12-14 DIAGNOSIS — J40 Bronchitis, not specified as acute or chronic: Secondary | ICD-10-CM | POA: Diagnosis not present

## 2018-01-13 DIAGNOSIS — J40 Bronchitis, not specified as acute or chronic: Secondary | ICD-10-CM | POA: Diagnosis not present

## 2018-01-13 DIAGNOSIS — J42 Unspecified chronic bronchitis: Secondary | ICD-10-CM | POA: Diagnosis not present

## 2018-02-01 ENCOUNTER — Other Ambulatory Visit: Payer: Self-pay

## 2018-02-01 MED ORDER — FUROSEMIDE 20 MG PO TABS: 20.0000 mg | ORAL_TABLET | Freq: Every day | ORAL | 1 refills | Status: DC

## 2018-02-13 DIAGNOSIS — J40 Bronchitis, not specified as acute or chronic: Secondary | ICD-10-CM | POA: Diagnosis not present

## 2018-02-13 DIAGNOSIS — J42 Unspecified chronic bronchitis: Secondary | ICD-10-CM | POA: Diagnosis not present

## 2018-03-15 ENCOUNTER — Ambulatory Visit: Payer: Medicare Other | Admitting: Family Medicine

## 2018-03-15 ENCOUNTER — Encounter: Payer: Self-pay | Admitting: Family Medicine

## 2018-03-15 VITALS — BP 136/88 | HR 88 | Temp 97.9°F | Ht 69.0 in | Wt 158.6 lb

## 2018-03-15 DIAGNOSIS — L97321 Non-pressure chronic ulcer of left ankle limited to breakdown of skin: Secondary | ICD-10-CM

## 2018-03-15 DIAGNOSIS — H6122 Impacted cerumen, left ear: Secondary | ICD-10-CM | POA: Diagnosis not present

## 2018-03-15 DIAGNOSIS — S41111A Laceration without foreign body of right upper arm, initial encounter: Secondary | ICD-10-CM

## 2018-03-15 DIAGNOSIS — F325 Major depressive disorder, single episode, in full remission: Secondary | ICD-10-CM | POA: Diagnosis not present

## 2018-03-15 DIAGNOSIS — J40 Bronchitis, not specified as acute or chronic: Secondary | ICD-10-CM | POA: Diagnosis not present

## 2018-03-15 DIAGNOSIS — Z23 Encounter for immunization: Secondary | ICD-10-CM

## 2018-03-15 DIAGNOSIS — J42 Unspecified chronic bronchitis: Secondary | ICD-10-CM | POA: Diagnosis not present

## 2018-03-15 DIAGNOSIS — I10 Essential (primary) hypertension: Secondary | ICD-10-CM | POA: Diagnosis not present

## 2018-03-15 DIAGNOSIS — N3281 Overactive bladder: Secondary | ICD-10-CM

## 2018-03-15 DIAGNOSIS — I5189 Other ill-defined heart diseases: Secondary | ICD-10-CM

## 2018-03-15 LAB — CBC WITH DIFFERENTIAL/PLATELET
BASOS PCT: 1 % (ref 0.0–3.0)
Basophils Absolute: 0.1 10*3/uL (ref 0.0–0.1)
EOS ABS: 0.2 10*3/uL (ref 0.0–0.7)
Eosinophils Relative: 4 % (ref 0.0–5.0)
HEMATOCRIT: 35 % — AB (ref 39.0–52.0)
Hemoglobin: 11.7 g/dL — ABNORMAL LOW (ref 13.0–17.0)
Lymphocytes Relative: 23.2 % (ref 12.0–46.0)
Lymphs Abs: 1.4 10*3/uL (ref 0.7–4.0)
MCHC: 33.5 g/dL (ref 30.0–36.0)
MCV: 93 fl (ref 78.0–100.0)
Monocytes Absolute: 0.6 10*3/uL (ref 0.1–1.0)
Monocytes Relative: 9.7 % (ref 3.0–12.0)
Neutro Abs: 3.7 10*3/uL (ref 1.4–7.7)
Neutrophils Relative %: 62.1 % (ref 43.0–77.0)
Platelets: 216 10*3/uL (ref 150.0–400.0)
RBC: 3.76 Mil/uL — ABNORMAL LOW (ref 4.22–5.81)
RDW: 14 % (ref 11.5–15.5)
WBC: 6 10*3/uL (ref 4.0–10.5)

## 2018-03-15 LAB — COMPREHENSIVE METABOLIC PANEL
ALT: 10 U/L (ref 0–53)
AST: 20 U/L (ref 0–37)
Albumin: 4.1 g/dL (ref 3.5–5.2)
Alkaline Phosphatase: 68 U/L (ref 39–117)
BUN: 21 mg/dL (ref 6–23)
CALCIUM: 9.2 mg/dL (ref 8.4–10.5)
CHLORIDE: 103 meq/L (ref 96–112)
CO2: 27 meq/L (ref 19–32)
Creatinine, Ser: 1.41 mg/dL (ref 0.40–1.50)
GFR: 49.48 mL/min — AB (ref >60.00)
Glucose, Bld: 98 mg/dL (ref 70–99)
POTASSIUM: 5.2 meq/L — AB (ref 3.5–5.1)
Sodium: 138 mEq/L (ref 135–145)
Total Bilirubin: 0.5 mg/dL (ref 0.2–1.2)
Total Protein: 7.1 g/dL (ref 6.0–8.3)

## 2018-03-15 LAB — POC URINALSYSI DIPSTICK (AUTOMATED)
Blood, UA: NEGATIVE
GLUCOSE UA: NEGATIVE
Ketones, UA: NEGATIVE
NITRITE UA: NEGATIVE
PROTEIN UA: NEGATIVE
Spec Grav, UA: 1.015 (ref 1.010–1.025)
UROBILINOGEN UA: 0.2 U/dL
pH, UA: 6.5 (ref 5.0–8.0)

## 2018-03-15 MED ORDER — SERTRALINE HCL 50 MG PO TABS: 50.0000 mg | ORAL_TABLET | Freq: Every day | ORAL | 3 refills | Status: DC

## 2018-03-15 NOTE — Progress Notes (Signed)
Subjective:  Roger [name] is a 84 y.o. year old very pleasant male patient who presents for/with See problem oriented charting ROS- complains of nocturia. Has depressed mood and anhedonia. Given his age some thoughts of being better off dead but not of self harm  Past Medical History-  Patient Active Problem List   Diagnosis Date Noted  . Ascending aortic aneurysm ([place]) 10/17/2017    Priority: High  . Skin ulcer of left ankle, limited to breakdown of skin ([place]) 10/16/2016    Priority: High  . Diastolic dysfunction 67/67/2094    Priority: High  . Overactive bladder 07/13/2014    Priority: High  . Allergic conjunctivitis of both eyes 06/22/2017    Priority: Medium  . Depression, major, single episode, complete remission ([place]) 07/05/2015    Priority: Medium  . History of pneumonia 03/10/2015    Priority: Medium  . Chronic throat clearing 10/11/2014    Priority: Medium  . CKD (chronic kidney disease), stage III ([place]) 07/13/2014    Priority: Medium  . Syncope 11/17/2013    Priority: Medium  . Hypertension 06/17/2007    Priority: Medium  . Hyperlipidemia 02/22/2007    Priority: Medium  . Venous (peripheral) insufficiency 01/26/2015    Priority: Low  . Macular degeneration 06/19/2011    Priority: Low  . Osteoarthritis 06/05/2009    Priority: Low  . CARCINOMA, SKIN, SQUAMOUS CELL 05/26/2008    Priority: Low  . GERD (gastroesophageal reflux disease) 02/22/2007    Priority: Low  . Laceration of scalp 10/17/2017  . Aortic atherosclerosis ([place]) 10/17/2017  . Bronchitis 08/29/2017  . Idiopathic chronic venous hypertension of left lower extremity with ulcer and inflammation ([place]) 10/22/2016  . Onychomycosis 10/22/2016    Medications- reviewed and updated Current Outpatient Medications  Medication Sig Dispense Refill  . albuterol (PROVENTIL HFA;VENTOLIN HFA) 108 (90 Base) MCG/ACT inhaler Inhale 2 puffs into the lungs every 6 (six) hours as needed for wheezing or shortness of  breath. 1 Inhaler 0  . albuterol (PROVENTIL) (2.5 MG/3ML) 0.083% nebulizer solution Take 3 mLs (2.5 mg total) by nebulization every 6 (six) hours as needed for wheezing or shortness of breath. 150 mL 1  . aspirin (ECOTRIN) 325 MG EC tablet Take 325 mg by mouth daily.    . fluticasone (FLONASE) 50 MCG/ACT nasal spray Place 2 sprays into both nostrils daily. 16 g 6  . furosemide (LASIX) 20 MG tablet Take 1 tablet (20 mg total) by mouth daily. 90 tablet 1  . guaiFENesin (MUCINEX) 600 MG 12 hr tablet Take 2 tablets (1,200 mg total) by mouth 2 (two) times daily. 60 tablet 0  . lovastatin (MEVACOR) 40 MG tablet Take 1 tablet (40 mg total) by mouth daily. 90 tablet 3  . Multiple Vitamins-Minerals (MULTIVITAMIN WITH MINERALS) tablet Take 1 tablet by mouth daily.    [name] omeprazole (PRILOSEC) 20 MG capsule TAKE ONE CAPSULE BY MOUTH TWICE A DAY BEFORE A MEAL 180 capsule 2  . sertraline (ZOLOFT) 25 MG tablet Take 1 tablet (25 mg total) by mouth daily. 90 tablet 3  . UNABLE TO FIND Med Name: Med pass 120 mL by mouth twice daily for supplement     No current facility-administered medications for this visit.      Objective: BP 136/88 (BP Location: Left Arm, Patient Position: Sitting, Cuff Size: Normal)   Pulse 88   Temp 97.9 F (36.6 C) (Oral)   Ht 5\' 9"  (1.753 m)   Wt 158 lb 9.6 oz (71.9 kg)  SpO2 95%   BMI 23.42 kg/m  Gen: NAD, resting comfortably CV: RRR no murmurs rubs or gallops Lungs: CTAB no crackles, wheeze, rhonchi Abdomen: soft/nontender/nondistended/normal bowel sounds. No rebound or guarding.  Ext: 1+ edema Skin: warm, dry Neuro: wheelchair bound, very hard of hearing  Procedure note: Cerumen noted in left ear (full occlusion) Irrigation with water and peroxide performed. Full view of tympanic membrane after procedure.  Patient tolerated procedure well  Assessment/Plan:   Suffered laceration right hand on Friday from a dog's nails of family friend. We will update his Td  today  Right ear cerumen impaction- irrigation as above. Hearing aid not working as well due to thsi  Hypertension S: controlled on lasix 20mg  daily alone.  BP Readings from Last 3 Encounters:  03/15/18 136/88  10/23/17 130/82  10/16/17 138/70  A/P: We discussed blood pressure goal of <140/90. Would love particularly lower DBP but with aneurysm history ascending aorta but at his age 35 want to push lower and increase fall risk  Overactive bladder S:In past- Oxybutynin 2.5mg  (stop firmly 09/28/17 when complaining or retention symptoms) . Patient with more nocturia since stopping rx and asks about restarting A/P: I advised against the restart due to concerns of urinary retention/UTI development. He will need to deal with nocturia at this point from OAB at this point. UA today with leukocytes small- will see if can add culture- if not consider adding at follow up   Skin ulcer of left ankle, limited to breakdown of skin ([place]) Luckily this has healed and I see no recurrence on exam.   Diastolic dysfunction Will change to diastolic dysfunction from diastolic CHF as do not recall history of hospitalization for fluid overload  Depression, major, single episode, complete remission ([place]) S: has had several friend losses as well of lost dog recently. Even prior to that has had somewhat down mood. He wonders if he needs (as does family) stronger dose of medicine. He is compliant with zoloft 25mg  and PHQ9 of 4 today A/P: I think we can get better control by going to 50 mg- advised follow up within 2 months to reassess  Follow up within 2 months due to medication adjustment  Lab/Order associations: Depression, major, single episode, complete remission (HCC)  Essential hypertension - Plan: CBC with Differential/Platelet, Comprehensive metabolic panel, POCT Urinalysis Dipstick (Automated)  Overactive bladder  Arm laceration, right, initial encounter - Plan: Td vaccine greater than or equal to 7yo  preservative free IM  Skin ulcer of left ankle, limited to breakdown of skin (HCC)  Diastolic dysfunction  Need for prophylactic vaccination with tetanus-diphtheria (Td) - Plan: Td vaccine greater than or equal to 7yo preservative free IM  Hearing loss due to cerumen impaction, left  Meds ordered this encounter  Medications  . sertraline (ZOLOFT) 50 MG tablet    Sig: Take 1 tablet (50 mg total) by mouth daily.    Dispense:  30 tablet    Refill:  3    Return precautions advised.  Garret Reddish, MD

## 2018-03-15 NOTE — Assessment & Plan Note (Signed)
Luckily this has healed and I see no recurrence on exam.

## 2018-03-15 NOTE — Assessment & Plan Note (Signed)
S: controlled on lasix 20mg  daily alone.  BP Readings from Last 3 Encounters:  03/15/18 136/88  10/23/17 130/82  10/16/17 138/70  A/P: We discussed blood pressure goal of <140/90. Would love particularly lower DBP but with aneurysm history ascending aorta but at his age dont want to push lower and increase fall risk

## 2018-03-15 NOTE — Assessment & Plan Note (Signed)
S:In past- Oxybutynin 2.5mg  (stop firmly 09/28/17 when complaining or retention symptoms) . Patient with more nocturia since stopping rx and asks about restarting A/P: I advised against the restart due to concerns of urinary retention/UTI development. He will need to deal with nocturia at this point from OAB at this point. UA today with leukocytes small- will see if can add culture- if not consider adding at follow up

## 2018-03-15 NOTE — Assessment & Plan Note (Signed)
S: has had several friend losses as well of lost dog recently. Even prior to that has had somewhat down mood. He wonders if he needs (as does family) stronger dose of medicine. He is compliant with zoloft 25mg  and PHQ9 of 4 today A/P: I think we can get better control by going to 50 mg- advised follow up within 2 months to reassess

## 2018-03-15 NOTE — Patient Instructions (Addendum)
Health maintenance 1. I would also like for you to sign up for an annual wellness visit with one of our nurses, Cassie or Manuela Schwartz, who both specialize in the annual wellness visit. This is a free benefit under medicare that may help [place] find additional ways to help you. Some highlights are reviewing medications, lifestyle, and doing a dementia screen. 2. Please check with your pharmacy to see if they have the shingrix vaccine. If they do- please get this immunization and update [place] by phone call or mychart with dates you receive the vaccine  Before you leave the room- tetanus shot (Td) under right arm laceration  Please stop by the lab before you go- check urine and blood  Increase zoloft to 50mg . Stop the 25mg  dose  Id like to recheck in 6-8 weeks to see how you are doing on higher dose. Please schedule a visit

## 2018-03-15 NOTE — Assessment & Plan Note (Signed)
Will change to diastolic dysfunction from diastolic CHF as do not recall history of hospitalization for fluid overload

## 2018-04-06 ENCOUNTER — Other Ambulatory Visit: Payer: Self-pay | Admitting: Family Medicine

## 2018-04-15 DIAGNOSIS — J40 Bronchitis, not specified as acute or chronic: Secondary | ICD-10-CM | POA: Diagnosis not present

## 2018-04-15 DIAGNOSIS — J42 Unspecified chronic bronchitis: Secondary | ICD-10-CM | POA: Diagnosis not present

## 2018-05-11 ENCOUNTER — Ambulatory Visit: Payer: Medicare Other

## 2018-05-12 ENCOUNTER — Ambulatory Visit: Payer: Medicare Other

## 2018-05-16 DIAGNOSIS — J40 Bronchitis, not specified as acute or chronic: Secondary | ICD-10-CM | POA: Diagnosis not present

## 2018-05-16 DIAGNOSIS — J42 Unspecified chronic bronchitis: Secondary | ICD-10-CM | POA: Diagnosis not present

## 2018-05-17 ENCOUNTER — Encounter: Payer: Self-pay | Admitting: Family Medicine

## 2018-05-17 ENCOUNTER — Ambulatory Visit (INDEPENDENT_AMBULATORY_CARE_PROVIDER_SITE_OTHER): Payer: Medicare Other | Admitting: Family Medicine

## 2018-05-17 VITALS — BP 124/68 | HR 77 | Temp 97.9°F | Ht 69.0 in | Wt 158.0 lb

## 2018-05-17 DIAGNOSIS — N3281 Overactive bladder: Secondary | ICD-10-CM | POA: Diagnosis not present

## 2018-05-17 DIAGNOSIS — Z23 Encounter for immunization: Secondary | ICD-10-CM | POA: Diagnosis not present

## 2018-05-17 DIAGNOSIS — F325 Major depressive disorder, single episode, in full remission: Secondary | ICD-10-CM | POA: Diagnosis not present

## 2018-05-17 DIAGNOSIS — E785 Hyperlipidemia, unspecified: Secondary | ICD-10-CM

## 2018-05-17 NOTE — Patient Instructions (Addendum)
Health Maintenance Due  Topic Date Due  . INFLUENZA VACCINE - flu shot today 03/25/2018   Stop ecotrin/aspirin. This may help with bruising/bleeding  Can talk to [name] about possibly going back to Dr. Lucia Gaskins to see if he has any thoughts about the chronic cough/plegm issues   No other changes today

## 2018-05-17 NOTE — Progress Notes (Signed)
Subjective:  Roger [name] is a 84 y.o. year old very pleasant male patient who presents for/with See problem oriented charting ROS- continued edema.  some knee pain. No chest pain or shortness of breath.   Past Medical History-  Patient Active Problem List   Diagnosis Date Noted  . Ascending aortic aneurysm ([place]) 10/17/2017    Priority: High  . Skin ulcer of left ankle, limited to breakdown of skin ([place]) 10/16/2016    Priority: High  . Diastolic dysfunction 83/66/2947    Priority: High  . Overactive bladder 07/13/2014    Priority: High  . Allergic conjunctivitis of both eyes 06/22/2017    Priority: Medium  . Depression, major, single episode, complete remission ([place]) 07/05/2015    Priority: Medium  . History of pneumonia 03/10/2015    Priority: Medium  . Chronic throat clearing 10/11/2014    Priority: Medium  . CKD (chronic kidney disease), stage III ([place]) 07/13/2014    Priority: Medium  . Syncope 11/17/2013    Priority: Medium  . Hypertension 06/17/2007    Priority: Medium  . Hyperlipidemia 02/22/2007    Priority: Medium  . Venous (peripheral) insufficiency 01/26/2015    Priority: Low  . Macular degeneration 06/19/2011    Priority: Low  . Osteoarthritis 06/05/2009    Priority: Low  . CARCINOMA, SKIN, SQUAMOUS CELL 05/26/2008    Priority: Low  . GERD (gastroesophageal reflux disease) 02/22/2007    Priority: Low  . Laceration of scalp 10/17/2017  . Aortic atherosclerosis ([place]) 10/17/2017  . Bronchitis 08/29/2017  . Idiopathic chronic venous hypertension of left lower extremity with ulcer and inflammation ([place]) 10/22/2016  . Onychomycosis 10/22/2016    Medications- reviewed and updated Current Outpatient Medications  Medication Sig Dispense Refill  . aspirin (ECOTRIN) 325 MG EC tablet Take 325 mg by mouth daily.    . fluticasone (FLONASE) 50 MCG/ACT nasal spray Place 2 sprays into both nostrils daily. 16 g 6  . furosemide (LASIX) 20 MG tablet Take 1 tablet (20 mg  total) by mouth daily. 90 tablet 1  . guaiFENesin (MUCINEX) 600 MG 12 hr tablet Take 2 tablets (1,200 mg total) by mouth 2 (two) times daily. 60 tablet 0  . lovastatin (MEVACOR) 40 MG tablet Take 1 tablet (40 mg total) by mouth daily. 90 tablet 3  . Multiple Vitamins-Minerals (MULTIVITAMIN WITH MINERALS) tablet Take 1 tablet by mouth daily.    [name] omeprazole (PRILOSEC) 20 MG capsule TAKE ONE CAPSULE BY MOUTH TWICE A DAY BEFORE A MEAL 180 capsule 2  . sertraline (ZOLOFT) 50 MG tablet TAKE 1 TABLET BY MOUTH EVERY DAY 90 tablet 0  . UNABLE TO FIND Med Name: Med pass 120 mL by mouth twice daily for supplement     Objective: BP 124/68 (BP Location: Left Arm, Patient Position: Sitting, Cuff Size: Large)   Pulse 77   Temp 97.9 F (36.6 C) (Oral)   Ht 5\' 9"  (1.753 m)   Wt 158 lb (71.7 kg)   SpO2 94%   BMI 23.33 kg/m  Gen: NAD, resting comfortably Some throat clearing during visit CV: RRR no murmurs rubs or gallops Lungs: CTAB no crackles, wheeze, rhonchi Abdomen: soft/nontender/nondistended/normal bowel sounds. Ext: 1+ edema Skin: warm, dry Neuro: uses wheelchair for transport  Assessment/Plan:  Other notes: 1.continued issues with phlegm- offered referral back to ENT- they decline for now.    Hyperlipidemia S: well controlled on lovastatin 40mg  with last check in February 2018 Lab Results  Component Value Date   CHOL  139 05/19/2012   HDL 44.50 05/19/2012   LDLCALC 73 05/19/2012   LDLDIRECT 77.0 10/16/2016   TRIG 109.0 05/19/2012   CHOLHDL 3 05/19/2012   A/P: consider labs at next visit. No clear benefit of primary prevention but also likely low risk- will maintain statin. Given no cva/pad/cad history we did opt to stop his aspirin     Overactive bladder S: patient feels he is doing reaosnably well off oxybutynin yet on lasix. History of retention like symptoms on oxybutynin A/P: continue without rx- glad he is satisfied at current levels   Depression, major, single episode,  complete remission ([place]) S: patient and wife think he is doing reasonably well on zoloft. No thoughts of self harm.  A/P: one of primary signs is his irritability- consider dose increase at future visit perhaps to 75mg  if needed   Future Appointments  Date Time Provider [place]  07/19/2018  1:45 PM [name], MD [facility]   Lab/Order associations: Need for prophylactic vaccination and inoculation against influenza - Plan: Flu vaccine HIGH DOSE PF  Hyperlipidemia, unspecified hyperlipidemia type  Overactive bladder  Depression, major, single episode, complete remission ([place])  Return precautions advised.  Garret Reddish, MD

## 2018-05-19 ENCOUNTER — Ambulatory Visit: Payer: Medicare Other | Admitting: Family Medicine

## 2018-05-19 NOTE — Assessment & Plan Note (Signed)
S: patient and wife think he is doing reasonably well on zoloft. No thoughts of self harm.  A/P: one of primary signs is his irritability- consider dose increase at future visit perhaps to 75mg  if needed

## 2018-05-19 NOTE — Assessment & Plan Note (Signed)
S: well controlled on lovastatin 40mg  with last check in February 2018 Lab Results  Component Value Date   CHOL 139 05/19/2012   HDL 44.50 05/19/2012   LDLCALC 73 05/19/2012   LDLDIRECT 77.0 10/16/2016   TRIG 109.0 05/19/2012   CHOLHDL 3 05/19/2012   A/P: consider labs at next visit. No clear benefit of primary prevention but also likely low risk- will maintain statin. Given no cva/pad/cad history we did opt to stop his aspirin

## 2018-05-19 NOTE — Assessment & Plan Note (Signed)
S: patient feels he is doing reaosnably well off oxybutynin yet on lasix. History of retention like symptoms on oxybutynin A/P: continue without rx- glad he is satisfied at current levels

## 2018-05-20 ENCOUNTER — Encounter: Payer: Self-pay | Admitting: Family Medicine

## 2018-06-15 DIAGNOSIS — J42 Unspecified chronic bronchitis: Secondary | ICD-10-CM | POA: Diagnosis not present

## 2018-06-15 DIAGNOSIS — J40 Bronchitis, not specified as acute or chronic: Secondary | ICD-10-CM | POA: Diagnosis not present

## 2018-07-01 ENCOUNTER — Other Ambulatory Visit: Payer: Self-pay | Admitting: Family Medicine

## 2018-07-19 ENCOUNTER — Ambulatory Visit: Payer: Medicare Other | Admitting: Family Medicine

## 2018-08-03 ENCOUNTER — Other Ambulatory Visit: Payer: Self-pay

## 2018-08-03 ENCOUNTER — Telehealth: Payer: Self-pay | Admitting: Family Medicine

## 2018-08-03 MED ORDER — LOVASTATIN 40 MG PO TABS: 40.0000 mg | ORAL_TABLET | Freq: Every day | ORAL | 1 refills | Status: DC

## 2018-08-04 ENCOUNTER — Other Ambulatory Visit: Payer: Self-pay | Admitting: Family Medicine

## 2018-08-04 NOTE — Telephone Encounter (Signed)
See note

## 2018-08-04 NOTE — Telephone Encounter (Signed)
Patients daughter called in and stated meds were accidentally thrown out and they had to pay out of pocket for meds. He has about a weeks worth of meds.   sertraline (ZOLOFT) 50 MG tablet    [id number] [place], [place] - [address] [phone] (Phone) [phone] (Fax)

## 2018-08-04 NOTE — Telephone Encounter (Signed)
I am okay with refill if needed

## 2018-08-11 ENCOUNTER — Other Ambulatory Visit: Payer: Self-pay

## 2018-08-11 MED ORDER — SERTRALINE HCL 50 MG PO TABS: 50.0000 mg | ORAL_TABLET | Freq: Every day | ORAL | 0 refills | Status: DC

## 2018-08-11 NOTE — Telephone Encounter (Signed)
Rx refilled to local pharmacy.

## 2018-09-02 ENCOUNTER — Ambulatory Visit (INDEPENDENT_AMBULATORY_CARE_PROVIDER_SITE_OTHER): Payer: Medicare Other | Admitting: Family Medicine

## 2018-09-02 ENCOUNTER — Encounter: Payer: Self-pay | Admitting: Family Medicine

## 2018-09-02 VITALS — BP 139/78 | HR 69 | Temp 97.3°F | Ht 69.0 in | Wt 154.5 lb

## 2018-09-02 DIAGNOSIS — H9193 Unspecified hearing loss, bilateral: Secondary | ICD-10-CM

## 2018-09-02 DIAGNOSIS — I10 Essential (primary) hypertension: Secondary | ICD-10-CM | POA: Diagnosis not present

## 2018-09-02 DIAGNOSIS — H6123 Impacted cerumen, bilateral: Secondary | ICD-10-CM

## 2018-09-02 DIAGNOSIS — R05 Cough: Secondary | ICD-10-CM

## 2018-09-02 DIAGNOSIS — R053 Chronic cough: Secondary | ICD-10-CM

## 2018-09-02 NOTE — Progress Notes (Signed)
Subjective:  Roger [name] is a 85 y.o. year old very pleasant male patient who presents for/with See problem oriented charting ROS- hard of hearing. No chest pain or shortness of breath reported. A lot of throat clearing and cough. Mucus seems thicker since reducing muxinex.    Past Medical History-  Patient Active Problem List   Diagnosis Date Noted  . Ascending aortic aneurysm ([place]) 10/17/2017    Priority: High  . Skin ulcer of left ankle, limited to breakdown of skin ([place]) 10/16/2016    Priority: High  . Diastolic dysfunction 24/04/7352    Priority: High  . Overactive bladder 07/13/2014    Priority: High  . Allergic conjunctivitis of both eyes 06/22/2017    Priority: Medium  . Depression, major, single episode, complete remission ([place]) 07/05/2015    Priority: Medium  . History of pneumonia 03/10/2015    Priority: Medium  . Chronic cough 10/11/2014    Priority: Medium  . CKD (chronic kidney disease), stage III ([place]) 07/13/2014    Priority: Medium  . Syncope 11/17/2013    Priority: Medium  . Hypertension 06/17/2007    Priority: Medium  . Hyperlipidemia 02/22/2007    Priority: Medium  . Venous (peripheral) insufficiency 01/26/2015    Priority: Low  . Macular degeneration 06/19/2011    Priority: Low  . Osteoarthritis 06/05/2009    Priority: Low  . CARCINOMA, SKIN, SQUAMOUS CELL 05/26/2008    Priority: Low  . GERD (gastroesophageal reflux disease) 02/22/2007    Priority: Low  . Laceration of scalp 10/17/2017  . Aortic atherosclerosis ([place]) 10/17/2017  . Bronchitis 08/29/2017  . Idiopathic chronic venous hypertension of left lower extremity with ulcer and inflammation ([place]) 10/22/2016  . Onychomycosis 10/22/2016    Medications- reviewed and updated Current Outpatient Medications  Medication Sig Dispense Refill  . furosemide (LASIX) 20 MG tablet TAKE 1 TABLET BY MOUTH EVERY DAY 90 tablet 1  . guaiFENesin (MUCINEX) 600 MG 12 hr tablet Take 2 tablets (1,200 mg total)  by mouth 2 (two) times daily. 60 tablet 0  . lovastatin (MEVACOR) 40 MG tablet Take 1 tablet (40 mg total) by mouth daily. 90 tablet 1  . Multiple Vitamins-Minerals (MULTIVITAMIN WITH MINERALS) tablet Take 1 tablet by mouth daily.    [name] omeprazole (PRILOSEC) 20 MG capsule TAKE ONE CAPSULE BY MOUTH TWICE A DAY BEFORE A MEAL 180 capsule 2  . sertraline (ZOLOFT) 50 MG tablet Take 1 tablet (50 mg total) by mouth daily. 90 tablet 0  . UNABLE TO FIND Med Name: Med pass 120 mL by mouth twice daily for supplement     Objective: BP 139/78 (BP Location: Left Arm, Cuff Size: Large)   Pulse 69   Temp (!) 97.3 F (36.3 C) (Oral)   Ht 5\' 9"  (1.753 m)   Wt 154 lb 8 oz (70.1 kg)   SpO2 95%   BMI 22.82 kg/m  Gen: NAD, frequently hoarse and clears throat, intermittent cough significant cerumen impaction bilaterally with dried, thick cerumen.  CV: RRR no murmurs rubs or gallops Lungs: CTAB no crackles, wheeze, rhonchi Ext: no edema Skin: warm, dry, on scalp there is a 1.5 x 1.5 cm scab that is raised at least 2-3 mm. Able to shave portions of this off. Underlying this is an erythematous ulceration about 1.5 x 1.5 cm as well.  Neuro: very hard of hearing  Procedure note: Verbal consent obtained Copious obstructive Cerumen noted in bilateral ear (findings as per above) [name], LNP was instructed under  my care to provide iIrrigation with water and peroxide  Full view of tympanic membrane after procedure.  Patient tolerated procedure well  Assessment/Plan:  Other notes: 1.staples 2 years ago on top of head and head never healed after staples removed.  We attempted to shave off scab- will take a shower today hopefully-if underlying wound does not heal would likely refer to dermatology 2.Tried ecotrin/aspirin for pain although he had previously stopped it due to fall risk- helped for a bit then stopped as wasn't helping- pain in knees  Bilateral hearing loss  S: Patient with baseline poor hearing  but has worsened recently-he feels like his ears are full. A/P: Patient with baseline hearing loss- but cerumen was contributing to worsening hearing loss. Irrigation of ear canals completed with moderate improvement in hearing-patient feels like ears feel less full though which he appreciates  Hypertension S: controlled on Lasix 20 mg though high normal BP Readings from Last 3 Encounters:  09/02/18 139/78  05/17/18 124/68  03/15/18 136/88  A/P: blood pressure goal of <140/90. Continue current meds: We need to get a BMP next visit at a minimum    Chronic cough S: I am going to change the title today to chronic cough.  Patient states he has had a chronic cough for at least 5 years.  We had this listed in 2016 as chronic throat clearing/hoarseness/phlegm- but patient continues to more frequently described this as frequent cough and attempts to clear phlegm.  Complains of feeling hoarse/sounding like a smoker  He would really like further evaluation at this point though had declined previously.  His wife is with him and states that the prolonged coughing fits up to an hour really scare her  We have tried adjusting PPI dose.  We have trialed Zyrtec in the past-ultimately ENT thought this may be thickening secretions and stop this-he did better for a time off the Zyrtec.  We have trialed Flonase without relief.  We have patient on chronic Mucinex 1200 mg twice daily-though he has decreased this to 600 mg twice daily recently and that does seem to thickened secretions.  He has seen ear nose and throat Dr. Lucia Gaskins.  He has no history of asthma.  We have switched him off of losartan and onto irbesartan.   Patient is discouraged by his lack of progress today-he requests a referral to [facility] A/P: I told patient before sending him to [place] I would like for him to be evaluated by our local pulmonologists who are excellent  He did have the following finding last year "1. No evidence of residual  pneumonia. 2. 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  With ongoing cough would like their opinion about pulmonary fibrosis/emphysema/interstitial pneumonitis as contributing factors to chronic cough  Did not schedule follow up but 3-6 months would be reasonable  Lab/Order associations: Chronic cough - Plan: Ambulatory referral to Pulmonology  Essential hypertension  Return precautions advised.  Garret Reddish, MD

## 2018-09-02 NOTE — Assessment & Plan Note (Addendum)
S: I am going to change the title today to chronic cough.  Patient states he has had a chronic cough for at least 5 years.  We had this listed in 2016 as chronic throat clearing/hoarseness/phlegm- but patient continues to more frequently described this as frequent cough and attempts to clear phlegm.  Complains of feeling hoarse/sounding like a smoker  He would really like further evaluation at this point though had declined previously.  His wife is with him and states that the prolonged coughing fits up to an hour really scare her  We have tried adjusting PPI dose.  We have trialed Zyrtec in the past-ultimately ENT thought this may be thickening secretions and stop this-he did better for a time off the Zyrtec.  We have trialed Flonase without relief.  We have patient on chronic Mucinex 1200 mg twice daily-though he has decreased this to 600 mg twice daily recently and that does seem to thickened secretions.  He has seen ear nose and throat Dr. Lucia Gaskins.  He has no history of asthma.  We have switched him off of losartan and onto irbesartan.   Patient is discouraged by his lack of progress today-he requests a referral to [facility] A/P: I told patient before sending him to [place] I would like for him to be evaluated by our local pulmonologists who are excellent  He did have the following finding last year "1. No evidence of residual pneumonia. 2. 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  With ongoing cough would like their opinion about pulmonary fibrosis/emphysema/interstitial pneumonitis as contributing factors to chronic cough

## 2018-09-02 NOTE — Patient Instructions (Addendum)
Roger [name] did a great job on the ears!   We will call you within two weeks about your referral to pulmonology given your chonric cough. If you do not hear within 3 weeks, give [place] a call.   If the scalp scab reforms or skin simply just doesn't heal up- may be worth seeing dermatology for their opinion. Try to wash it today with shampoo and water if you can.

## 2018-09-02 NOTE — Assessment & Plan Note (Signed)
S: controlled on Lasix 20 mg though high normal BP Readings from Last 3 Encounters:  09/02/18 139/78  05/17/18 124/68  03/15/18 136/88  A/P: blood pressure goal of <140/90. Continue current meds: We need to get a BMP next visit at a minimum

## 2018-09-05 ENCOUNTER — Emergency Department (HOSPITAL_COMMUNITY)
Admission: EM | Admit: 2018-09-05 | Discharge: 2018-09-05 | Disposition: A | Discharge status: Home / Self Care | Payer: Medicare Other | Attending: Emergency Medicine | Admitting: Emergency Medicine

## 2018-09-05 ENCOUNTER — Encounter (HOSPITAL_COMMUNITY): Payer: Self-pay

## 2018-09-05 ENCOUNTER — Emergency Department (HOSPITAL_COMMUNITY): Payer: Medicare Other

## 2018-09-05 DIAGNOSIS — M169 Osteoarthritis of hip, unspecified: Secondary | ICD-10-CM | POA: Diagnosis not present

## 2018-09-05 DIAGNOSIS — Z87891 Personal history of nicotine dependence: Secondary | ICD-10-CM | POA: Insufficient documentation

## 2018-09-05 DIAGNOSIS — S73004A Unspecified dislocation of right hip, initial encounter: Secondary | ICD-10-CM | POA: Diagnosis not present

## 2018-09-05 DIAGNOSIS — N183 Chronic kidney disease, stage 3 (moderate): Secondary | ICD-10-CM | POA: Diagnosis not present

## 2018-09-05 DIAGNOSIS — M21851 Other specified acquired deformities of right thigh: Secondary | ICD-10-CM | POA: Diagnosis not present

## 2018-09-05 DIAGNOSIS — I129 Hypertensive chronic kidney disease with stage 1 through stage 4 chronic kidney disease, or unspecified chronic kidney disease: Secondary | ICD-10-CM | POA: Diagnosis not present

## 2018-09-05 DIAGNOSIS — Z79899 Other long term (current) drug therapy: Secondary | ICD-10-CM | POA: Diagnosis not present

## 2018-09-05 DIAGNOSIS — M1611 Unilateral primary osteoarthritis, right hip: Secondary | ICD-10-CM | POA: Diagnosis not present

## 2018-09-05 DIAGNOSIS — M25551 Pain in right hip: Secondary | ICD-10-CM | POA: Diagnosis not present

## 2018-09-05 DIAGNOSIS — R5381 Other malaise: Secondary | ICD-10-CM | POA: Diagnosis not present

## 2018-09-05 LAB — CBC WITH DIFFERENTIAL/PLATELET
Abs Immature Granulocytes: 0.01 10*3/uL (ref 0.00–0.07)
BASOS ABS: 0.1 10*3/uL (ref 0.0–0.1)
Basophils Relative: 1 %
EOS ABS: 0.2 10*3/uL (ref 0.0–0.5)
Eosinophils Relative: 3 %
HCT: 36.2 % — ABNORMAL LOW (ref 39.0–52.0)
Hemoglobin: 11.6 g/dL — ABNORMAL LOW (ref 13.0–17.0)
IMMATURE GRANULOCYTES: 0 %
LYMPHS ABS: 1.4 10*3/uL (ref 0.7–4.0)
Lymphocytes Relative: 22 %
MCH: 31.4 pg (ref 26.0–34.0)
MCHC: 32 g/dL (ref 30.0–36.0)
MCV: 98.1 fL (ref 80.0–100.0)
MONOS PCT: 9 %
Monocytes Absolute: 0.5 10*3/uL (ref 0.1–1.0)
NEUTROS PCT: 65 %
Neutro Abs: 4.1 10*3/uL (ref 1.7–7.7)
PLATELETS: 203 10*3/uL (ref 150–400)
RBC: 3.69 MIL/uL — AB (ref 4.22–5.81)
RDW: 13.6 % (ref 11.5–15.5)
WBC: 6.3 10*3/uL (ref 4.0–10.5)
nRBC: 0 % (ref 0.0–0.2)

## 2018-09-05 LAB — COMPREHENSIVE METABOLIC PANEL
ALBUMIN: 3.7 g/dL (ref 3.5–5.0)
ALT: 24 U/L (ref 0–44)
AST: 27 U/L (ref 15–41)
Alkaline Phosphatase: 58 U/L (ref 38–126)
Anion gap: 6 (ref 5–15)
BUN: 22 mg/dL (ref 8–23)
CHLORIDE: 107 mmol/L (ref 98–111)
CO2: 27 mmol/L (ref 22–32)
Calcium: 9.1 mg/dL (ref 8.9–10.3)
Creatinine, Ser: 1.24 mg/dL (ref 0.61–1.24)
GFR calc Af Amer: 57 mL/min — ABNORMAL LOW (ref >60)
GFR, EST NON AFRICAN AMERICAN: 49 mL/min — AB (ref >60)
Glucose, Bld: 88 mg/dL (ref 70–99)
POTASSIUM: 4.2 mmol/L (ref 3.5–5.1)
Sodium: 140 mmol/L (ref 135–145)
Total Bilirubin: 0.8 mg/dL (ref 0.3–1.2)
Total Protein: 6.6 g/dL (ref 6.5–8.1)

## 2018-09-05 NOTE — ED Notes (Signed)
Patient transported to X-ray 

## 2018-09-05 NOTE — ED Triage Notes (Signed)
Pt brought in by [facility] from home for right hip dislocation. Family states they felt a "bulge" on his hip this morning, hx of dislocation in the same hip x25 years ago. Pt denies pain, has no complaints. Per EMS pt ambulatory and weight bearing on scene. Pt states he thinks it happened x3 days ago when he rolled out of bed. Pt denies any recent falls or injury to hip. Per EMS pt leg is "slightly shortened". Pt very hard of hearing, has hearing aids in.

## 2018-09-05 NOTE — ED Provider Notes (Signed)
[facility] Provider Note   CSN: [id number] Arrival date & time: 09/05/18  1309     History   Chief Complaint Chief Complaint  Patient presents with  . Hip Injury    HPI [name] is a 85 y.o. male.  85 y.o male with a PMH of GERD, HTN, HLD presents to the ED with a chief complaint of right hip possible dislocation x this morning. Patient reports his daughter got a new matress recently and he has been sliding of due to his pijamas being "slippery". He believes this happened 3 days ago when he was transferring from his bed into a wheelchair. He reports ambulating the following days. He denies any pain with ambulation but state he feels " a bony bump". He had a previous hip dislocation around 20 years ago which was relocated by a Restaurant manager, fast food.  He denies any trauma, falls, or other injuries. No chest pain or shortness of breath.      Past Medical History:  Diagnosis Date  . GERD 02/22/2007  . HYPERLIPIDEMIA 02/22/2007  . HYPERTENSION 06/17/2007  . MACULAR DEGENERATION 02/22/2007  . OSTEOARTHRITIS, HIP, RIGHT 06/05/2009    Patient Active Problem List   Diagnosis Date Noted  . Laceration of scalp 10/17/2017  . Ascending aortic aneurysm ([place]) 10/17/2017  . Aortic atherosclerosis ([place]) 10/17/2017  . Bronchitis 08/29/2017  . Allergic conjunctivitis of both eyes 06/22/2017  . Idiopathic chronic venous hypertension of left lower extremity with ulcer and inflammation ([place]) 10/22/2016  . Onychomycosis 10/22/2016  . Skin ulcer of left ankle, limited to breakdown of skin ([place]) 10/16/2016  . Diastolic dysfunction 42/59/5638  . Depression, major, single episode, complete remission ([place]) 07/05/2015  . History of pneumonia 03/10/2015  . Venous (peripheral) insufficiency 01/26/2015  . Chronic cough 10/11/2014  . CKD (chronic kidney disease), stage III ([place]) 07/13/2014  . Overactive bladder 07/13/2014  . Syncope 11/17/2013  . Macular  degeneration 06/19/2011  . Osteoarthritis 06/05/2009  . CARCINOMA, SKIN, SQUAMOUS CELL 05/26/2008  . Hypertension 06/17/2007  . Hyperlipidemia 02/22/2007  . GERD (gastroesophageal reflux disease) 02/22/2007    Past Surgical History:  Procedure Laterality Date  . LAMINECTOMY  1988        Home Medications    Prior to Admission medications   Medication Sig Start Date End Date Taking? Authorizing Provider  furosemide (LASIX) 20 MG tablet TAKE 1 TABLET BY MOUTH EVERY DAY 08/04/18   [name], MD  guaiFENesin (MUCINEX) 600 MG 12 hr tablet Take 2 tablets (1,200 mg total) by mouth 2 (two) times daily. 03/11/15   Janece Canterbury, MD  lovastatin (MEVACOR) 40 MG tablet Take 1 tablet (40 mg total) by mouth daily. 08/03/18   [name], MD  Multiple Vitamins-Minerals (MULTIVITAMIN WITH MINERALS) tablet Take 1 tablet by mouth daily.    [provider]  omeprazole (PRILOSEC) 20 MG capsule TAKE ONE CAPSULE BY MOUTH TWICE A DAY BEFORE A MEAL 11/04/17   [name], MD  sertraline (ZOLOFT) 50 MG tablet Take 1 tablet (50 mg total) by mouth daily. 08/11/18   [name], MD  UNABLE TO FIND Med Name: Med pass 120 mL by mouth twice daily for supplement    [provider]  diltiazem (CARDIZEM CD) 180 MG 24 hr capsule Take 180 mg by mouth daily.  06/23/11 10/30/11  [provider]  hydrochlorothiazide (MICROZIDE) 12.5 MG capsule Take 1 capsule (12.5 mg total) by mouth daily. 07/04/11 11/19/11  Carolann Littler  W, MD    Family History Family History  Problem Relation Age of Onset  . Breast cancer Daughter     Social History Social History   Tobacco Use  . Smoking status: Former Research scientist (life sciences)  . Smokeless tobacco: Never Used  Substance Use Topics  . Alcohol use: No  . Drug use: No     Allergies   Amlodipine besylate; Aspirin; and Hydrocodone   Review of Systems Review of Systems  Constitutional: Negative for fever.  HENT: Negative for sore  throat.   Respiratory: Negative for shortness of breath.   Cardiovascular: Negative for chest pain.  Gastrointestinal: Negative for abdominal pain, diarrhea, nausea and vomiting.  Genitourinary: Negative for dysuria and flank pain.  Musculoskeletal: Positive for arthralgias and myalgias.  Skin: Negative for pallor and wound.  All other systems reviewed and are negative.    Physical Exam Updated Vital Signs BP (!) 151/74   Pulse 62   Temp 98.4 F (36.9 C) (Oral)   Resp 15   SpO2 99%   Physical Exam Vitals signs and nursing note reviewed.  Constitutional:      Appearance: He is well-developed.  HENT:     Head: Normocephalic and atraumatic.  Eyes:     General: No scleral icterus.    Pupils: Pupils are equal, round, and reactive to light.  Neck:     Musculoskeletal: Normal range of motion.  Cardiovascular:     Heart sounds: Normal heart sounds.  Pulmonary:     Effort: Pulmonary effort is normal.     Breath sounds: Normal breath sounds. No wheezing.  Chest:     Chest wall: No tenderness.  Abdominal:     General: Bowel sounds are normal. There is no distension.     Palpations: Abdomen is soft.     Tenderness: There is no abdominal tenderness.  Musculoskeletal:        General: No tenderness or deformity.     Right hip: He exhibits decreased strength. He exhibits normal range of motion, no tenderness, no bony tenderness, no swelling, no crepitus and no deformity.     Comments: Bony prominence on right hip, no tenderness to palpation. Strength 5/5 dorsiflexion and plantarflexion. Pulses present.   Skin:    General: Skin is warm and dry.  Neurological:     Mental Status: He is alert and oriented to person, place, and time.      ED Treatments / Results  Labs (all labs ordered are listed, but only abnormal results are displayed) Labs Reviewed  CBC WITH DIFFERENTIAL/PLATELET - Abnormal; Notable for the following components:      Result Value   RBC 3.69 (*)     Hemoglobin 11.6 (*)    HCT 36.2 (*)    All other components within normal limits  COMPREHENSIVE METABOLIC PANEL - Abnormal; Notable for the following components:   GFR calc non Af Amer 49 (*)    GFR calc Af Amer 57 (*)    All other components within normal limits    EKG [name]  Radiology Ct Hip Right Wo Contrast  Result Date: 09/05/2018 CLINICAL DATA:  Right hip pain.  No known injury. EXAM: CT OF THE RIGHT HIP WITHOUT CONTRAST TECHNIQUE: Multidetector CT imaging of the right hip was performed according to the standard protocol. Multiplanar CT image reconstructions were also generated. COMPARISON:  Right hip radiographs today and 06/05/2009. FINDINGS: Bones/Joint/Cartilage The bones are diffusely demineralized. There is severe right hip osteoarthritis with flattening of the femoral head, large  osteophytes, erosions and joint space narrowing. No evidence of acute fracture, dislocation or bone destruction. There is a small right hip joint effusion with multiple intra-articular loose bodies. Left hip osteoarthritis partially imaged. Ligaments Suboptimally assessed by CT. Muscles and Tendons Mild diffuse atrophy of the right hip musculature. Soft tissues Diffuse iliac atherosclerosis and ectasia. Diverticular changes throughout the distal colon. The urinary bladder is mildly trabeculated. No focal periarticular fluid collections identified. IMPRESSION: 1. Severe right hip osteoarthritis with femoral head deformity, but no evidence of acute fracture or dislocation. 2. No acute or focal soft tissue abnormalities identified. 3. Incidental findings in the pelvis, including iliac atherosclerosis, colonic diverticulosis and bladder trabeculation. Electronically Signed   By: Richardean Sale M.D.   On: 09/05/2018 16:36   Dg Hip Unilat W Or Wo Pelvis 2-3 Views Right  Result Date: 09/05/2018 CLINICAL DATA:  Right hip dislocation. EXAM: DG HIP (WITH OR WITHOUT PELVIS) 2-3V RIGHT COMPARISON:  Radiographs of June 05, 2009. FINDINGS: Severe osteoarthritis is seen involving both hip joints. There is noted moderate angulation of the right femoral neck concerning for fracture. IMPRESSION: Severe osteoarthritis is seen involving both hips. Moderate angulation deformity of right femoral neck is noted concerning for possible fracture, and unenhanced CT scan of the pelvis is recommended for further evaluation. Electronically Signed   By: Marijo Conception, M.D.   On: 09/05/2018 15:07    Procedures Procedures (including critical care time)  Medications Ordered in ED Medications - No data to display   Initial Impression / Assessment and Plan / ED Course  I have reviewed the triage vital signs and the nursing notes.  Pertinent labs & imaging results that were available during my care of the patient were reviewed by me and considered in my medical decision making (see chart for details).     Presents with right hip prominence, reports this has been going on for the past 3 days, denies any trauma, falls at this time.  Evaluation leg is not shortened, dorsiflexion and plantarflexion strength are normal bilateral. Hip x-ray showed: Severe osteoarthritis is seen involving both hips. Moderate  angulation deformity of right femoral neck is noted concerning for  possible fracture, and unenhanced CT scan of the pelvis is  recommended for further evaluation.   Will order some ED scan of the right hip without contrast along with some basic blood work in case patient does need to have surgical intervention.  MP showed no electrolyte abnormality, CBC showed no signs of leukocytosis, hemoglobin is within normal limits.  Vital signs have been stable during his stay.  CT Abdomen/Pelvis: 1. Severe right hip osteoarthritis with femoral head deformity, but  no evidence of acute fracture or dislocation.  2. No acute or focal soft tissue abnormalities identified.  3. Incidental findings in the pelvis, including iliac   atherosclerosis, colonic diverticulosis and bladder trabeculation.    I have informed the results of the CT report to daughter at the bedside, patient is stable and requesting home discharge. Vitals stable during visit, patient stable for discharge.   Final Clinical Impressions(s) / ED Diagnoses   Final diagnoses:  Pain of right hip joint    ED Discharge Orders    [name]       Janeece Fitting, PA-C 09/05/18 1711    Daleen Bo, MD 09/06/18 1049

## 2018-09-05 NOTE — Discharge Instructions (Addendum)
Your imaging today was normal, please follow up with your primary care as needed. If you experience any chest pain, shortness of breath or dizziness please return to the ED.

## 2018-09-05 NOTE — ED Provider Notes (Signed)
  Face-to-face evaluation   History: Patient here by EMS for evaluation of a right hip deformity withot apparent fall.  He has had a prior hip dislocation by his report.  He is primarily in wheelchair because of "arthritis."  Physical exam: Elderly man who is minimally uncomfortable.  Right hip has questionable deformity but has good range of motion without tenderness or pain.  Medical screening examination/treatment/procedure(s) were conducted as a shared visit with non-physician practitioner(s) and myself.  I personally evaluated the patient during the encounter    Daleen Bo, MD 09/06/18 1049

## 2018-09-10 ENCOUNTER — Ambulatory Visit: Payer: Medicare Other | Admitting: Family Medicine

## 2018-09-10 ENCOUNTER — Telehealth: Payer: Self-pay | Admitting: Family Medicine

## 2018-09-10 NOTE — Telephone Encounter (Signed)
Pt had wrong appt time for appt on 09/10/2018 and came in at 120pm. Pts wife stated pt is experiencing pain in his hip that he did not have before and thinks he needs to be seen ASAP. I put pt at 240pm on 09/24/2018. Please advise if pt can be seen sooner.

## 2018-09-10 NOTE — Telephone Encounter (Signed)
Patient is schedule with Dr Paulla Fore for Monday

## 2018-09-10 NOTE — Telephone Encounter (Signed)
Can you call this patient and offer them an appointment with Dr. Paulla Fore for Monday? I talked to Dr. Yong Channel and we feel this is the best route. Thanks!

## 2018-09-13 ENCOUNTER — Ambulatory Visit: Payer: Medicare Other | Admitting: Sports Medicine

## 2018-09-13 DIAGNOSIS — M25551 Pain in right hip: Secondary | ICD-10-CM | POA: Insufficient documentation

## 2018-09-16 ENCOUNTER — Institutional Professional Consult (permissible substitution): Payer: Medicare Other | Admitting: Internal Medicine

## 2018-09-24 ENCOUNTER — Encounter

## 2018-09-24 ENCOUNTER — Encounter: Payer: Self-pay | Admitting: Family Medicine

## 2018-09-24 ENCOUNTER — Ambulatory Visit (INDEPENDENT_AMBULATORY_CARE_PROVIDER_SITE_OTHER): Payer: Medicare Other | Admitting: Family Medicine

## 2018-09-24 VITALS — BP 100/70 | HR 80 | Temp 97.5°F | Ht 69.0 in

## 2018-09-24 DIAGNOSIS — I712 Thoracic aortic aneurysm, without rupture: Secondary | ICD-10-CM | POA: Diagnosis not present

## 2018-09-24 DIAGNOSIS — I700 Atherosclerosis of aorta: Secondary | ICD-10-CM

## 2018-09-24 DIAGNOSIS — R05 Cough: Secondary | ICD-10-CM | POA: Diagnosis not present

## 2018-09-24 DIAGNOSIS — M25551 Pain in right hip: Secondary | ICD-10-CM | POA: Diagnosis not present

## 2018-09-24 DIAGNOSIS — I7121 Aneurysm of the ascending aorta, without rupture: Secondary | ICD-10-CM

## 2018-09-24 DIAGNOSIS — I5030 Unspecified diastolic (congestive) heart failure: Secondary | ICD-10-CM | POA: Diagnosis not present

## 2018-09-24 DIAGNOSIS — R053 Chronic cough: Secondary | ICD-10-CM

## 2018-09-24 NOTE — Progress Notes (Signed)
Phone [phone]   Subjective:  [name] is a 85 y.o. year old very pleasant male patient who presents for/with See problem oriented charting ROS-patient complains of right hip pain when he lays on it.  No chest pain or shortness of breath.  Scalp lesion has decreased in size.  No surrounding erythema or expansion  BMI monitoring- elevated BMI noted: Body mass index is 22.82 kg/m. Given age- no change in weight is advised  Past Medical History-  Patient Active Problem List   Diagnosis Date Noted  . Ascending aortic aneurysm ([place]) 10/17/2017    Priority: High  . Diastolic congestive heart failure ([place]) 02/05/2016    Priority: High  . Overactive bladder 07/13/2014    Priority: High  . Allergic conjunctivitis of both eyes 06/22/2017    Priority: Medium  . Depression, major, single episode, complete remission ([place]) 07/05/2015    Priority: Medium  . History of pneumonia 03/10/2015    Priority: Medium  . Chronic cough 10/11/2014    Priority: Medium  . CKD (chronic kidney disease), stage III ([place]) 07/13/2014    Priority: Medium  . Syncope 11/17/2013    Priority: Medium  . Hypertension 06/17/2007    Priority: Medium  . Hyperlipidemia 02/22/2007    Priority: Medium  . Aortic atherosclerosis ([place]) 10/17/2017    Priority: Low  . Bronchitis 08/29/2017    Priority: Low  . Venous (peripheral) insufficiency 01/26/2015    Priority: Low  . Macular degeneration 06/19/2011    Priority: Low  . Osteoarthritis 06/05/2009    Priority: Low  . CARCINOMA, SKIN, SQUAMOUS CELL 05/26/2008    Priority: Low  . GERD (gastroesophageal reflux disease) 02/22/2007    Priority: Low  . Right hip pain 09/13/2018  . Laceration of scalp 10/17/2017  . Onychomycosis 10/22/2016    Medications- reviewed and updated Current Outpatient Medications  Medication Sig Dispense Refill  . furosemide (LASIX) 20 MG tablet TAKE 1 TABLET BY MOUTH EVERY DAY 90 tablet 1  . lovastatin (MEVACOR) 40 MG tablet  Take 1 tablet (40 mg total) by mouth daily. 90 tablet 1  . Multiple Vitamins-Minerals (MULTIVITAMIN WITH MINERALS) tablet Take 1 tablet by mouth daily.    [name] omeprazole (PRILOSEC) 20 MG capsule TAKE ONE CAPSULE BY MOUTH TWICE A DAY BEFORE A MEAL 180 capsule 2  . sertraline (ZOLOFT) 50 MG tablet Take 1 tablet (50 mg total) by mouth daily. 90 tablet 0  . UNABLE TO FIND Med Name: Med pass 120 mL by mouth twice daily for supplement    . guaiFENesin (MUCINEX) 600 MG 12 hr tablet Take 2 tablets (1,200 mg total) by mouth 2 (two) times daily. (Patient not taking: Reported on 09/24/2018) 60 tablet 0   No current facility-administered medications for this visit.      Objective:  BP 100/70 (BP Location: Left Arm, Patient Position: Sitting, Cuff Size: Large)   Pulse 80   Temp (!) 97.5 F (36.4 C) (Oral)   Ht 5\' 9"  (1.753 m)   SpO2 98%   BMI 22.82 kg/m  Gen: NAD, resting comfortably CV: RRR no murmurs rubs or gallops Lungs: CTAB no crackles, wheeze, rhonchi Ext: Stable 1+ edema Skin: warm, dry, 2.5 x 1 cm area of crusting on scalp-thin skin underneath this- able to remove this easily today but also with removal of epithelium. MSK: Patient with a 2-1/2 x 2 and half centimeter bulge over right greater trochanter-somewhat mobile.  Minimal pain with palpation Neuro-hard of hearing   Assessment and Plan   #  Scalp lesion-has reduced in size to about 2.5 x 1 cm-debrided the top again.  Hopefully will continue to heal slowly.  #Ascending aortic aneurysm- reasonable to repeat high resolution chest CT at next visit  #Aortic atherosclerosis- continue to manage blood pressure and continue lovastatin for cholesterol-would be reasonable to update lipid panel at next visit   #Diastolic CHF S: Patient's volume status appears stable- stable edema on exam.  Weight is largely stable in the 150s.  No increased shortness of breath reported on Lasix 20 mg A/P:  Stable. Continue current medications.      Right hip  pain S: Patient has had right hip pain for 2 to 3 weeks- he turned over in the bed and it felt like something popped.-He ended up going to the emergency room on January 12-initial x-ray of the hip Showed severe arthritis with angulation deformity of the right femoral neck concerning for possible fracture-patient then had a CT scan performed which showed severe arthritis again of the right hip with femoral head deformity but without evidence of acute fracture or dislocation. - Patient currently reports pain only at night when he lays on his right side but that is the side he lays on.  Patient has felt a bulge off of his greater trochanter on the right side and multiple family members have noted as well. A/P: We reviewed the x-rays and CT together.  The area over his hip is soft and somewhat mobile so I do not think it is part of his greater trochanter-I am concerned for greater trochanteric bursitis.  I discussed the case with Dr. Gurney Maxin is in agreement to see patient in follow-up to consider injection-patient and caregiver today also interested to this intervention  Chronic cough S: Patient with continued chronic cough.  We reviewed his CT scan together from October 12, 2017-there was concern at that time for "nonspecific interstitial pneumonitis.  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  At the time given patient's age, patient and family did not want to pursue this further but with continued chronic cough they are in agreement to pulmonology consultation.  He had seen ENT in the past Dr. Lucia Gaskins- Dr. Lucia Gaskins had stopped allergy medication Zyrtec and this seemed to send out the phlegm but not resolved the cough A/P: Patient did not receive a call yet from pulmonology-our referral coordinator is looking into this.  No change in current treatment planned-he is already on omeprazole twice a day for reflux, Mucinex twice a day-symptoms worsened  off of this.  We discussed with longer term smoking than originally thought-emphysema could be playing a role-we will ask for pulmonology's opinion on PFTs.  Future Appointments  Date Time Provider [place]  09/27/2018 11:40 AM Gerda Diss, DO [facility]   Lab/Order associations: Right hip pain - Plan: Ambulatory referral to Sports Medicine  Chronic cough  Diastolic congestive heart failure, unspecified HF chronicity ([place]), Chronic  Aortic atherosclerosis ([place]), Chronic  Ascending aortic aneurysm ([place])  Return precautions advised.  Garret Reddish, MD

## 2018-09-24 NOTE — Assessment & Plan Note (Signed)
S: Patient has had right hip pain for 2 to 3 weeks- he turned over in the bed and it felt like something popped.-He ended up going to the emergency room on January 12-initial x-ray of the hip Showed severe arthritis with angulation deformity of the right femoral neck concerning for possible fracture-patient then had a CT scan performed which showed severe arthritis again of the right hip with femoral head deformity but without evidence of acute fracture or dislocation. - Patient currently reports pain only at night when he lays on his right side but that is the side he lays on.  Patient has felt a bulge off of his greater trochanter on the right side and multiple family members have noted as well. A/P: We reviewed the x-rays and CT together.  The area over his hip is soft and somewhat mobile so I do not think it is part of his greater trochanter-I am concerned for greater trochanteric bursitis.  I discussed the case with Dr. Gurney Maxin is in agreement to see patient in follow-up to consider injection-patient and caregiver today also interested to this intervention

## 2018-09-24 NOTE — Patient Instructions (Addendum)
We will call you about your Pulmonology referral.  Please schedule an appointment with Dr. Paulla Fore before you leave today

## 2018-09-24 NOTE — Assessment & Plan Note (Signed)
S: Patient with continued chronic cough.  We reviewed his CT scan together from October 12, 2017-there was concern at that time for "nonspecific interstitial pneumonitis.  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  At the time given patient's age, patient and family did not want to pursue this further but with continued chronic cough they are in agreement to pulmonology consultation.  He had seen ENT in the past Dr. Lucia Gaskins- Dr. Lucia Gaskins had stopped allergy medication Zyrtec and this seemed to send out the phlegm but not resolved the cough A/P: Patient did not receive a call yet from pulmonology-our referral coordinator is looking into this.  No change in current treatment planned-he is already on omeprazole twice a day for reflux, Mucinex twice a day-symptoms worsened off of this.  We discussed with longer term smoking than originally thought-emphysema could be playing a role-we will ask for pulmonology's opinion on PFTs.

## 2018-09-27 ENCOUNTER — Ambulatory Visit (INDEPENDENT_AMBULATORY_CARE_PROVIDER_SITE_OTHER): Payer: Medicare Other | Admitting: Sports Medicine

## 2018-09-27 ENCOUNTER — Ambulatory Visit: Payer: Self-pay

## 2018-09-27 ENCOUNTER — Encounter: Payer: Self-pay | Admitting: Sports Medicine

## 2018-09-27 VITALS — BP 138/76 | HR 86 | Ht 69.0 in

## 2018-09-27 DIAGNOSIS — M1711 Unilateral primary osteoarthritis, right knee: Secondary | ICD-10-CM

## 2018-09-27 DIAGNOSIS — M1611 Unilateral primary osteoarthritis, right hip: Secondary | ICD-10-CM

## 2018-09-27 DIAGNOSIS — M25551 Pain in right hip: Secondary | ICD-10-CM

## 2018-09-27 DIAGNOSIS — M24651 Ankylosis, right hip: Secondary | ICD-10-CM

## 2018-09-27 NOTE — Patient Instructions (Addendum)

## 2018-09-27 NOTE — Progress Notes (Signed)
Roger [name]. [place], [place] [facility] [place]  DAREK EIFLER - 85 y.o. male MRN [id number]  Date of birth: 12/20/1919  Visit Date: September 27, 2018  PCP: [name], MD   Referred by: [name], MD  SUBJECTIVE:  Chief Complaint  Patient presents with  . Right Hip - Initial Assessment    XR and CT R hip 09/05/18 at ED.   Roger [name] New Patient (Initial Visit)    R hip pain. Referred by Dr. Garret Reddish.    HPI: Patient presents with right posterior hip pain that is been present for quite some time and worsened over the past 2 to 3 weeks.  He is having a difficult time laying directly on his lateral hip.  This is worse at night and reported as a 6 to a 7 out of 10.  He does have some swelling that radiates down to the right knee.  He has weakness with the right hip but he is in a wheelchair at home on a regular basis.  Worsening of the right-sided hip pain is caused with laying directly on side especially at night.  Ibuprofen and Tylenol have not provided any relief.  He has been seen in the emergency department as well as by his PCP Dr. Yong Channel who is referred him here for injection treatment.  Patient is aware that he has severe end-stage osteoarthritis of both his hip and his knee.  He is not interested in total hip or total knee arthroplasty at this time.  REVIEW OF SYSTEMS: He does have significant nighttime disturbances while laying directly on this right side. He has had a fall 6 months ago and is typically in a wheelchair at home. He does get numbness and tingling in his toes and weakness in his right hip that is been chronic.   He gets lower extremity swelling and edema in his right foot and knee. Otherwise 12 point review of systems performed and is negative   HISTORY:  Prior history reviewed and updated per electronic medical record.  Patient Active Problem List   Diagnosis Date Noted  . Right hip pain  09/13/2018    09/05/2018 CT R hip IMPRESSION: 1. Severe right hip osteoarthritis with femoral head deformity, but no evidence of acute fracture or dislocation. 2. No acute or focal soft tissue abnormalities identified. 3. Incidental findings in the pelvis, including iliac atherosclerosis, colonic diverticulosis and bladder trabeculation.   . Laceration of scalp 10/17/2017  . Ascending aortic aneurysm ([place]) 10/17/2017  . Aortic atherosclerosis ([place]) 10/17/2017  . Bronchitis 08/29/2017    hospitalized from 08/29/16 to 09/01/17.Roger [name] Required o2. Also aspiration pneumonia   . Allergic conjunctivitis of both eyes 06/22/2017  . Onychomycosis 10/22/2016  . Diastolic congestive heart failure ([place]) 02/05/2016    Lasix 20mg  daily 2017 echo- normal EF though   . Depression, major, single episode, complete remission ([place]) 07/05/2015    zoloft 25mg --> 50mg    . History of pneumonia 03/10/2015    PNA related 02/2015- acute respiratory failure with hypoxia   . Venous (peripheral) insufficiency 01/26/2015  . Chronic cough 10/11/2014    refer ENT Dr. Lucia Gaskins. plhlegm thinner after he stopped zyrtec per ENT advise   . CKD (chronic kidney disease), stage III ([place]) 07/13/2014    Can use lasix BID for edema sparingly if needed   . Overactive bladder 07/13/2014    In past- Oxybutynin 2.5mg  (stop firmly 09/28/17 when complaining or retention  symptoms)    . Syncope 11/17/2013    After choking episode 10/2013 when eating hot cross bun. Patient concerned due to phlegm that he has.    . Macular degeneration 06/19/2011  . Osteoarthritis 06/05/2009    Right hip, knee.   Tylenol- no help NSaids- don't use with CKD and heart risk Tramadol/narcotics- constipation   . CARCINOMA, SKIN, SQUAMOUS CELL 05/26/2008        . Hypertension 06/17/2007     lasix 20mg   In past- losartan 100mg  (stop with cough), irbesartan 150mg    . Hyperlipidemia 02/22/2007    Lovastatin 40mg . No clear benefit primary prevention in  90s.       Roger [name] GERD (gastroesophageal reflux disease) 02/22/2007    prilosec     Social History   Occupational History  . Not on file  Tobacco Use  . Smoking status: Former Research scientist (life sciences)  . Smokeless tobacco: Never Used  Substance and Sexual Activity  . Alcohol use: No  . Drug use: No  . Sexual activity: Never   Social History   Social History Narrative   Married (wife Emelia in [place] practice) 1947. 2 Roger [name] oldest daughter to breast cancer. 4 grandkids.       Patient and wife live with daughter. In basement-with lift.        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    Full Code.        Past Medical History:  Diagnosis Date  . GERD 02/22/2007  . HYPERLIPIDEMIA 02/22/2007  . HYPERTENSION 06/17/2007  . MACULAR DEGENERATION 02/22/2007  . OSTEOARTHRITIS, HIP, RIGHT 06/05/2009     Past Surgical History:  Procedure Laterality Date  . LAMINECTOMY  1988    family history includes Breast cancer in his daughter.  Recent Labs    03/15/18 1407 09/05/18 1524  CREATININE 1.41 1.24  CALCIUM 9.2 9.1  AST 20 27  ALT 10 24    OBJECTIVE:  VS:  HT:5\' 9"  (175.3 cm)   WT:   BMI:     BP:138/76  HR:86bpm  TEMP: ( )  RESP:98 %   PHYSICAL EXAM: Adult male.  No acute distress.  Alert and appropriate. EYES: Pupils are equal., EOM intact without nystagmus. and No scleral icterus. Psychiatric: Alert & appropriately interactive. and Not depressed or anxious appearing. EXTREMITY EXAM: Warm and well perfused  Right lower extremity has a slight flexor contracture with actively no internal and external rotation of his right hip.  He has no significant pain with axial load of this hip but does have a hard time bearing weight.  He has a palpable nodule along the posterior aspect and superior portion of the greater trochanter.  This is slightly nodular and moderate to palpation.  Lower extremity pulses are palpable. No significant edema.   ASSESSMENT:  1.  Primary osteoarthritis of right hip   2. Right hip pain   3. Primary osteoarthritis of right knee   4. Hip ankylosis, right     PROCEDURES:  Ultrasound-guided injection performed today  PLAN:  Pertinent additional documentation may be included in corresponding procedure notes, imaging studies, problem based documentation and patient instructions.  No problem-specific Assessment & Plan notes found for this encounter.  Ultimately patient has ankylosis of his right hip and I believe is actually caused a partial tear of his gluteal tendon.  This was directly injected under ultrasound.  He has significant osteoarthritic change of the right hip but is not interested in any type of  intervention.  He has had no pain with actual hip joint eliciting maneuvers and so intra-articular injection was deferred given the focal nodular pain that is causing him discomfort at night.    Intra-articular injection can be considered if any lack of improvement.  Activity modifications and the importance of avoiding exacerbating activities (limiting pain to no more than a 4 / 10 during or following activity) recommended and discussed.  Discussed red flag symptoms that warrant earlier emergent evaluation and patient voices understanding.  At follow up will plan to consider: repeat corticosteroid injections  Return if symptoms worsen or fail to improve.          Gerda Diss, [facility] Physician

## 2018-09-27 NOTE — Procedures (Signed)
PROCEDURE NOTE:  Ultrasound Guided: Injection: Right hip Images were obtained and interpreted by myself, Teresa Coombs, DO  Images have been saved and stored to PACS system. Images obtained on: GE S7 Ultrasound machine    ULTRASOUND FINDINGS:  Marked thickening of the greater trochanter with significant soft tissue disruption just distal to the posterior aspect of the insertion of the glute medius tendon with slight hypoechoic change within the tissue consistent with a likely partial tear of the right gluteal musculature with slight bursal formation.  There is no increased neovascularity or hypervascular findings.  DESCRIPTION OF PROCEDURE:  The patient's clinical condition is marked by substantial pain and/or significant functional disability. Other conservative therapy has not provided relief, is contraindicated, or not appropriate. There is a reasonable likelihood that injection will significantly improve the patient's pain and/or functional impairment.   After discussing the risks, benefits and expected outcomes of the injection and all questions were reviewed and answered, the patient wished to undergo the above named procedure.  Verbal consent was obtained.  The ultrasound was used to identify the target structure and adjacent neurovascular structures. The skin was then prepped in sterile fashion and the target structure was injected under direct visualization using sterile technique as below:  Single injection performed as below: PREP: Alcohol and Ethel Chloride APPROACH:direct, stopcock technique, 21g 2 in. INJECTATE: 5 cc 1% lidocaine, 2 cc 0.5% Marcaine and 2 cc 40mg /mL DepoMedrol ASPIRATE: None DRESSING: Band-Aid  Post procedural instructions including recommending icing and warning signs for infection were reviewed.    This procedure was well tolerated and there were no complications.   IMPRESSION: Succesful Ultrasound Guided: Injection

## 2018-10-12 ENCOUNTER — Ambulatory Visit (INDEPENDENT_AMBULATORY_CARE_PROVIDER_SITE_OTHER)
Admission: RE | Admit: 2018-10-12 | Discharge: 2018-10-12 | Disposition: A | Discharge status: Home / Self Care | Payer: Medicare Other | Source: Ambulatory Visit | Attending: Internal Medicine | Admitting: Internal Medicine

## 2018-10-12 ENCOUNTER — Encounter: Payer: Self-pay | Admitting: Internal Medicine

## 2018-10-12 ENCOUNTER — Other Ambulatory Visit (HOSPITAL_COMMUNITY): Payer: Self-pay

## 2018-10-12 ENCOUNTER — Ambulatory Visit: Payer: Medicare Other | Admitting: Internal Medicine

## 2018-10-12 VITALS — BP 128/64 | HR 91 | Ht 68.5 in | Wt 154.0 lb

## 2018-10-12 DIAGNOSIS — R1310 Dysphagia, unspecified: Secondary | ICD-10-CM

## 2018-10-12 DIAGNOSIS — R053 Chronic cough: Secondary | ICD-10-CM

## 2018-10-12 DIAGNOSIS — R05 Cough: Secondary | ICD-10-CM | POA: Diagnosis not present

## 2018-10-12 NOTE — Progress Notes (Signed)
Roger [name], male    DOB: 07-04-1920,    MRN: [id number]   Brief patient profile:  85 yowm quit smoking  Quit age 85 I with no resp dz new dx of bronchitis > admit   Admit date: 08/29/2017 Discharge date: 09/01/2017  Admitted From: Home.  Disposition:  Home.   Home Health: yes with oxygen.   Discharge Condition:stable.  CODE STATUS: full code.  Diet recommendation: Heart Healthy  Brief/Interim Summary: Roger Virani Meloniis a 85 y.o.malewith medical history significant ofHypertension, hyperlipidemia, osteoarthritis, GERD, chronic diastolic heart failure, depression, stage 3 CKD. MACULAR degeneration, brought in by his daughter for worsening sob, cough, congestion, since yesterday. On arrival to ED, he was diffusely wheezing, borderline sats on RA requiring 2 to 3 lit of [place] oxygen to keep sats greater than 90%. influenza pcr is negative. He was referred to medical service for admission for bronchospasm, acute bronchitis.    Discharge Diagnoses:  Active Problems:   Hyperlipidemia   Hypertension   GERD (gastroesophageal reflux disease)   Osteoarthritis   CKD (chronic kidney disease), stage III (HCC)   Depression   Diastolic CHF (H[facility])   Bronchitis aspiration pneumonia.    Acute respiratory failure with hypoxia secondary to bronchitis and a component of bronchospasm/ aspiration pneumonia.   Admitted for steroids, duo nebs, azithromycin for bronchitis. Influenza PCR is negative Nasal cannula oxygen to keep sats greater than 90%. PT evaluation. He continues to require 4 lit of  oxygen on ambulation.  CT chest ordered, showed pneumonia, possibly aspiration.  Added rocephin, and ordere SLP eval.  SLP evaL, MBS done and diet changed.  Discussed the results with daughter at bedside. Discharged on oral antibitoic to complete the course of pneumonia.   Discharge Instructions      Discharge Instructions    Diet - low sodium heart healthy   Complete by:  As  directed    Discharge instructions   Complete by:  As directed    Please follow up with PCP in one week and please follow the below diet as per SLP.  Dys3/ground meats/thin Start meal with liquids Follow bite of solid with multiple sips of liquids Dry swallows with liquids Medicine crushed with puree Keep cough/hock strong.   We have stopped the losartan for your renal function , please check with your PCP before restarting the medication.   Increase activity slowly   Complete by:  As directed      Allergies as of 09/01/2017      Reactions   Amlodipine Besylate    REACTION: swelling of feet   Aspirin    REACTION: GI upset can tolerate ecotrin   Hydrocodone    GI upset              Medication List     STOP taking these medications   losartan 100 MG tablet Commonly known as:  COZAAR     TAKE these medications   amoxicillin-clavulanate 875-125 MG tablet Commonly known as:  AUGMENTIN Take 1 tablet by mouth every 12 (twelve) hours for 7 days.   ECOTRIN 325 MG EC tablet Generic drug:  aspirin Take 325 mg by mouth daily.   fluticasone 50 MCG/ACT nasal spray Commonly known as:  FLONASE Place 2 sprays into both nostrils daily.   furosemide 20 MG tablet Commonly known as:  LASIX Take 1 tablet (20 mg total) daily by mouth.   guaiFENesin 600 MG 12 hr tablet Commonly known as:  MUCINEX Take 2 tablets (1,200  mg total) by mouth 2 (two) times daily.   lovastatin 40 MG tablet Commonly known as:  MEVACOR Take 1 tablet (40 mg total) by mouth daily.   multivitamin with minerals tablet Take 1 tablet by mouth daily.   omeprazole 20 MG capsule Commonly known as:  PRILOSEC TAKE ONE CAPSULE BY MOUTH TWICE A DAY BEFORE A MEAL   oxybutynin 5 MG tablet Commonly known as:  DITROPAN Take 0.5 tablets (2.5 mg total) by mouth daily.   UNABLE TO FIND Med Name: Med pass 120 mL by mouth twice daily for supplement        History of Present  Illness  10/12/2018  Pulmonary/ 1st office eval/Roger [name]  Chief Complaint  Patient presents with  . Pulmonary Consult    Referred by Dr. Rushie Chestnut. Pt c/o cough off and on for the past several years. Cough is prod with white sputum.    Dyspnea:  Across the room with walker  Cough: cough 2-3 x per week    ENT = [facility] f/u ?  Better since stopped  flonase/ and worse p eats with sense of pnds/ globus but min white mucus production Sleep: R side down flat bed never wakes up with cough per care taker/ min in am only and only few times a week SABA use: no  Coughing makes his nose run / zytec made mucus thicker and harder to cough up  Using white life savers   No obvious day to day or daytime variability or assoc excess/ purulent sputum or mucus plugs or hemoptysis or cp or chest tightness, subjective wheeze or overt   hb symptoms.   Sleeping as above  without nocturnal  or early am exacerbation  of respiratory  c/o's or need for noct saba. Also denies any obvious fluctuation of symptoms with weather or environmental changes or other aggravating or alleviating factors except as outlined above   No unusual exposure hx or h/o childhood pna/ asthma or knowledge of premature birth.  Current Allergies, Complete Past Medical History, Past Surgical History, Family History, and Social History were reviewed in [employer] [employer] record.  ROS  The following are not active complaints unless bolded Hoarseness, sore throat, dysphagia, dental problems, itching, sneezing,  nasal congestion or discharge of excess mucus or purulent secretions, ear ache,   fever, chills, sweats, unintended wt loss or wt gain, classically pleuritic or exertional cp,  orthopnea pnd or arm/hand swelling  or leg swelling, presyncope, palpitations, abdominal pain, anorexia, nausea, vomiting, diarrhea  or change in bowel habits or change in bladder habits, change in stools or change in urine, dysuria, hematuria,  rash,  arthralgias, visual complaints, headache, numbness, weakness or ataxia or problems with walking or coordination,  change in mood or  memory.           Past Medical History:  Diagnosis Date  . GERD 02/22/2007  . HYPERLIPIDEMIA 02/22/2007  . HYPERTENSION 06/17/2007  . MACULAR DEGENERATION 02/22/2007  . OSTEOARTHRITIS, HIP, RIGHT 06/05/2009    Outpatient Medications Prior to Visit  Medication Sig Dispense Refill  . furosemide (LASIX) 20 MG tablet TAKE 1 TABLET BY MOUTH EVERY DAY 90 tablet 1  . lovastatin (MEVACOR) 40 MG tablet Take 1 tablet (40 mg total) by mouth daily. 90 tablet 1  . Multiple Vitamins-Minerals (MULTIVITAMIN WITH MINERALS) tablet Take 1 tablet by mouth daily.    [name] omeprazole (PRILOSEC) 20 MG capsule TAKE ONE CAPSULE BY MOUTH TWICE A DAY BEFORE A MEAL 180 capsule 2  .  sertraline (ZOLOFT) 50 MG tablet Take 1 tablet (50 mg total) by mouth daily. 90 tablet 0  . UNABLE TO FIND Med Name: Med pass 120 mL by mouth twice daily for supplement    . guaiFENesin (MUCINEX) 600 MG 12 hr tablet Take 2 tablets (1,200 mg total) by mouth 2 (two) times daily. 60 tablet 0      Objective:     BP 128/64 (BP Location: Left Arm, Cuff Size: Normal)   Pulse 91   Ht 5' 8.5" (1.74 m)   Wt 154 lb (69.9 kg)   SpO2 94%   BMI 23.08 kg/m   SpO2: 94 %  RA  slt hoarse wm in w/c due to R hip and R knee / extremely hard of hearing   HEENT: nl dentition, turbinates bilaterally, and oropharynx. Nl external ear canals without cough reflex   NECK :  without JVD/Nodes/TM/ nl carotid upstrokes bilaterally   LUNGS: no acc muscle use,  Nl contour chest which is clear to A and P bilaterally without cough on insp or exp maneuvers   CV:  RRR  no s3 or murmur or increase in P2, and 1+ lower ext pitting  edema sym bilaterally  ABD:  Mod distended but soft  with nl inspiratory excursion in the supine position. No bruits or organomegaly appreciated, bowel sounds nl  MS:    ext warm without deformities,  calf tenderness, cyanosis or clubbing   SKIN: warm and dry without lesions    NEURO:  alert, very hard to judge sensorium due to hearing def/    no motor or cerebellar deficits apparent.     CXR PA and Lateral:   10/12/2018 :    I personally reviewed images and agree   impression as follows:   Reduced lung volumes bilaterally with large amt of air in intestines including above the liver          Assessment   Chronic cough Onset around 2017  -   ENT Dr. Lucia Gaskins. plhlegm thinner after he stopped zyrtec/ worse pnds p started flonase so stopped it -   Pulmonary eval 10/12/2018 reported worse p meals > MBS ordered   DDX of  difficult airways management almost all start with A and  include Adherence, Ace Inhibitors, Acid Reflux, Active Sinus Disease, Alpha 1 Antitripsin deficiency, Anxiety masquerading as Airways dz,  ABPA,  Allergy(esp in young), Aspiration (esp in elderly), Adverse effects of meds,  Active smoking or vaping, A bunch of PE's (a small clot burden can't cause this syndrome unless there is already severe underlying pulm or vascular dz with poor reserve) plus two Bs  = Bronchiectasis and Beta blocker use..and one C= CHF  Aspiration top of the list of usual suspects and also concerned about gastroparesis here based on cxr which shows large vol of air in intestinal tract >  MBS/ gi eval prn   ? Acid (or non-acid) GERD > always difficult to exclude as up to 75% of pts in some series report no assoc GI/ Heartburn symptoms> rec max (24h)  acid suppression and diet restrictions/ reviewed and instructions given in writing > NO MORE MINTS  ? Allergy > doubt new onset at age 46, eos not up, no better on flonase or zyrtec  ? Active sinus dz > [facility] did not think so at previous eval > f/u ent prn   ? Adverse effects of meds > none of the usual suspects listed    >>>  Will pursue GI  issues first, then regroup here if needed    Advised caretakers to read "Being Mortal" as it pertains  to swallowing issues at the end of life and limited options available to deal with them non-invasively.    Total time devoted to counseling  > 50 % of initial 60 min office visit:  review case with pt/ care taker discussion of limited  options/alternatives/ personally creating written customized instructions  in presence of pt  then going over those specific  Instructions directly with the pt including how to use all of the meds but in particular covering each new medication in detail and the difference between the maintenance= "automatic" meds and the prns using an action plan format for the latter (If this problem/symptom => do that organization reading Left to right).  Please see AVS from this visit for a full list of these instructions which I personally wrote for this pt and  are unique to this visit.           Christinia Gully, MD 10/12/2018

## 2018-10-12 NOTE — Patient Instructions (Addendum)
Continue prilosec 20 mg Take 30- 60 min before your first and last meals of the day   GERD (REFLUX)  is an extremely common cause of respiratory symptoms just like yours , many times with no obvious heartburn at all.    It can be treated with medication, but also with lifestyle changes including elevation of the head of your bed (ideally with 6 -8inch blocks under the headboard of your bed),  Smoking cessation, avoidance of late meals, excessive alcohol, and avoid fatty foods, chocolate, peppermint, colas, red wine, and acidic juices such as orange juice.  NO MINT OR MENTHOL PRODUCTS SO NO COUGH DROPS  USE SUGARLESS CANDY INSTEAD (Jolley ranchers or Stover's or Life Savers) or even ice chips will also do - the key is to swallow to prevent all throat clearing. NO OIL BASED VITAMINS - use powdered substitutes.  Avoid fish oil when coughing.    Being Mortal has a section about swallowing issues you should read   Please see patient coordinator before you leave today  to schedule modified barium swallow   Please remember to go to the  x-ray department  for your tests - we will call you with the results when they are available    Pulmonary follow up is as needed

## 2018-10-13 ENCOUNTER — Encounter: Payer: Self-pay | Admitting: Internal Medicine

## 2018-10-13 NOTE — Assessment & Plan Note (Signed)
Onset around 2017  -   ENT Dr. Lucia Gaskins. plhlegm thinner after he stopped zyrtec/ worse pnds p started flonase so stopped it -   Pulmonary eval 10/12/2018 reported worse p meals > MBS ordered   DDX of  difficult airways management almost all start with A and  include Adherence, Ace Inhibitors, Acid Reflux, Active Sinus Disease, Alpha 1 Antitripsin deficiency, Anxiety masquerading as Airways dz,  ABPA,  Allergy(esp in young), Aspiration (esp in elderly), Adverse effects of meds,  Active smoking or vaping, A bunch of PE's (a small clot burden can't cause this syndrome unless there is already severe underlying pulm or vascular dz with poor reserve) plus two Bs  = Bronchiectasis and Beta blocker use..and one C= CHF  Aspiration top of the list of usual suspects and also concerned about gastroparesis here based on cxr which shows large vol of air in intestinal tract >  MBS/ gi eval prn   ? Acid (or non-acid) GERD > always difficult to exclude as up to 75% of pts in some series report no assoc GI/ Heartburn symptoms> rec max (24h)  acid suppression and diet restrictions/ reviewed and instructions given in writing > NO MORE MINTS  ? Allergy > doubt new onset at age 85, eos not up, no better on flonase or zyrtec  ? Active sinus dz > [facility] did not think so at previous eval > f/u ent prn   ? Adverse effects of meds > none of the usual suspects listed    >>>  Will pursue GI issues first, then regroup here if needed    Advised caretakers to read "Being Mortal" as it pertains to swallowing issues at the end of life and limited options available to deal with them non-invasively.    Total time devoted to counseling  > 50 % of initial 60 min office visit:  review case with pt/ care taker discussion of limited  options/alternatives/ personally creating written customized instructions  in presence of pt  then going over those specific  Instructions directly with the pt including how to use all of the meds but in  particular covering each new medication in detail and the difference between the maintenance= "automatic" meds and the prns using an action plan format for the latter (If this problem/symptom => do that organization reading Left to right).  Please see AVS from this visit for a full list of these instructions which I personally wrote for this pt and  are unique to this visit.

## 2018-10-13 NOTE — Progress Notes (Signed)
Spoke with pt and notified of results per Dr. Wert. Pt verbalized understanding and denied any questions. 

## 2018-10-28 ENCOUNTER — Ambulatory Visit (HOSPITAL_COMMUNITY): Payer: Medicare Other

## 2018-10-28 ENCOUNTER — Inpatient Hospital Stay (HOSPITAL_COMMUNITY): Admission: RE | Admit: 2018-10-28 | Payer: Medicare Other | Source: Ambulatory Visit

## 2018-11-16 ENCOUNTER — Ambulatory Visit (HOSPITAL_COMMUNITY): Payer: Medicare Other

## 2018-11-16 ENCOUNTER — Encounter (HOSPITAL_COMMUNITY): Payer: Medicare Other

## 2018-12-10 ENCOUNTER — Other Ambulatory Visit: Payer: Self-pay | Admitting: Family Medicine

## 2019-01-03 ENCOUNTER — Telehealth (HOSPITAL_COMMUNITY): Payer: Self-pay

## 2019-01-03 NOTE — Telephone Encounter (Signed)
Called and spoke with patient in regards to rescheduling OP MBS. Patient is having a hard time understanding what i'm saying. Spoke with wife and she stated they are "scared to get out of here and want to wait until things quiet down". Will contact patient at a later date to reschedule. - HW

## 2019-01-16 ENCOUNTER — Other Ambulatory Visit: Payer: Self-pay | Admitting: Family Medicine

## 2019-01-26 ENCOUNTER — Other Ambulatory Visit: Payer: Self-pay | Admitting: Family Medicine

## 2019-01-27 ENCOUNTER — Ambulatory Visit: Payer: Medicare Other | Admitting: Family Medicine

## 2019-01-28 ENCOUNTER — Ambulatory Visit: Payer: Medicare Other | Admitting: Family Medicine

## 2019-01-31 ENCOUNTER — Other Ambulatory Visit: Payer: Self-pay

## 2019-01-31 ENCOUNTER — Encounter: Payer: Self-pay | Admitting: Family Medicine

## 2019-01-31 ENCOUNTER — Ambulatory Visit (INDEPENDENT_AMBULATORY_CARE_PROVIDER_SITE_OTHER): Payer: Medicare Other | Admitting: Family Medicine

## 2019-01-31 VITALS — BP 135/77 | HR 77 | Temp 97.8°F | Ht 68.5 in | Wt 167.0 lb

## 2019-01-31 DIAGNOSIS — I10 Essential (primary) hypertension: Secondary | ICD-10-CM

## 2019-01-31 DIAGNOSIS — I712 Thoracic aortic aneurysm, without rupture: Secondary | ICD-10-CM

## 2019-01-31 DIAGNOSIS — H6122 Impacted cerumen, left ear: Secondary | ICD-10-CM | POA: Diagnosis not present

## 2019-01-31 DIAGNOSIS — F325 Major depressive disorder, single episode, in full remission: Secondary | ICD-10-CM

## 2019-01-31 DIAGNOSIS — I7121 Aneurysm of the ascending aorta, without rupture: Secondary | ICD-10-CM

## 2019-01-31 NOTE — Assessment & Plan Note (Addendum)
S: Patient noted to have a sending aortic aneurysm in February 2019.  Our original plan was 6 to 12 months follow-up.   A/P: Today I discussed with patient possibly repeating this-he asks what would be done if aneurysm increased in size.  At his age he would not be interested in surgery.  My only thought of potential benefit would be tightening blood pressure control but that may increase his risk of falls-patient was okay with me calling his daughter later this week to discuss further and they can make a family decision.

## 2019-01-31 NOTE — Progress Notes (Signed)
Phone [phone]   Subjective:  Roger [name] is a 85 y.o. year old very pleasant male patient who presents for/with See problem oriented charting Chief Complaint  Patient presents with  . Ear Fullness   ROS- No fever, chills, new cough,  New shortness of breath, body aches, sore throat, or loss of taste or smell    Past Medical History-  Patient Active Problem List   Diagnosis Date Noted  . Ascending aortic aneurysm ([place]) 10/17/2017    Priority: High  . Diastolic congestive heart failure ([place]) 02/05/2016    Priority: High  . Overactive bladder 07/13/2014    Priority: High  . Allergic conjunctivitis of both eyes 06/22/2017    Priority: Medium  . Depression, major, single episode, complete remission ([place]) 07/05/2015    Priority: Medium  . History of pneumonia 03/10/2015    Priority: Medium  . Chronic cough 10/11/2014    Priority: Medium  . CKD (chronic kidney disease), stage III ([place]) 07/13/2014    Priority: Medium  . Syncope 11/17/2013    Priority: Medium  . Hypertension 06/17/2007    Priority: Medium  . Hyperlipidemia 02/22/2007    Priority: Medium  . Aortic atherosclerosis ([employer]) 10/17/2017    Priority: Low  . Bronchitis 08/29/2017    Priority: Low  . Venous (peripheral) insufficiency 01/26/2015    Priority: Low  . Macular degeneration 06/19/2011    Priority: Low  . Osteoarthritis 06/05/2009    Priority: Low  . CARCINOMA, SKIN, SQUAMOUS CELL 05/26/2008    Priority: Low  . GERD (gastroesophageal reflux disease) 02/22/2007    Priority: Low  . Right hip pain 09/13/2018  . Laceration of scalp 10/17/2017  . Onychomycosis 10/22/2016    Medications- reviewed and updated Current Outpatient Medications  Medication Sig Dispense Refill  . furosemide (LASIX) 20 MG tablet TAKE 1 TABLET BY MOUTH EVERY DAY 90 tablet 1  . lovastatin (MEVACOR) 40 MG tablet TAKE 1 TABLET BY MOUTH EVERY DAY 90 tablet 1  . Multiple Vitamins-Minerals (MULTIVITAMIN WITH MINERALS) tablet  Take 1 tablet by mouth daily.    [name] omeprazole (PRILOSEC) 20 MG capsule TAKE ONE CAPSULE BY MOUTH TWICE A DAY BEFORE A MEAL 180 capsule 0  . sertraline (ZOLOFT) 50 MG tablet TAKE 1 TABLET BY MOUTH EVERY DAY 90 tablet 0  . UNABLE TO FIND Med Name: Med pass 120 mL by mouth twice daily for supplement     No current facility-administered medications for this visit.      Objective:  BP 135/77   Pulse 77   Temp 97.8 F (36.6 C) (Oral)   Ht 5' 8.5" (1.74 m)   Wt 167 lb (75.8 kg)   BMI 25.02 kg/m  Gen: NAD, resting comfortably Full obstruction of TM bilaterally- resolved after irrigation CV: RRR no murmurs rubs or gallops Lungs: CTAB no crackles, wheeze, rhonchi Abdomen: soft/nontender Ext: no edema Skin: warm, dry Neuro: hard of hearing even with hearing aid  Procedure note: Verbal consent obtained- discussed risk of tympanic membrane perforation Obstructive copious Cerumen noted in right and left ear. This obscures evaluation of tympanic membrane.  Irrigation with water and peroxide performed bilateraly. Bilateralull view of tympanic membrane after procedure.  Patient tolerated procedure well      Assessment and Plan   # Depression S: has been more irritable at home lately. Admits covid 19 has been somewhat tough tough he actually prefers to be at home for most part. Compliant with zoloft 50mg . Patient feels like his depression is  controlled and he does not want to make medication adjustments A/P: suspect mild poor control based on irritability- I am going to reach out to his daughter about a possible increase zoloft to 75 mg (1.5 tablets of 50mg ) - to see if they can discuss further at home  Cerumen impaction right ear and left ear S: Patient has poor hearing at baseline and wears hearing aids but recently has noted worsening issues and right ear fullness.  This seems to be getting worse and he typically requires irrigation to help with this so he presented today. A/P: Irrigation  successfully removed cerumen today.  Hearing improved afterwards  Ascending aortic aneurysm [facility]) S: Patient noted to have a sending aortic aneurysm in February 2019.  Our original plan was 6 to 12 months follow-up.   A/P: Today I discussed with patient possibly repeating this-he asks what would be done if aneurysm increased in size.  At his age he would not be interested in surgery.  My only thought of potential benefit would be tightening blood pressure control but that may increase his risk of falls-patient was okay with me calling his daughter later this week to discuss further and they can make a family decision.  Hypertension S: controlled on Lasix 20 mg alone though could have tighter control with aneurysm history BP Readings from Last 3 Encounters:  01/31/19 135/77  10/12/18 128/64  09/27/18 138/76  A/P: See aneurysm section but I am hesitant to increase strength of blood pressure medicines due to patient fall risk and age.    Another factor in holding off on repeat CT scan is fact family is trying to stay home as much as possible to reduce covid 19 risk. Patient came in today because when wax gets bad decreases quality of life when he cant hear Lab/Order associations: Depression, major, single episode, complete remission (HCC)  Impacted cerumen, left ear  Ascending aortic aneurysm [facility])  Essential hypertension  Return precautions advised.  Garret Reddish, MD

## 2019-01-31 NOTE — Patient Instructions (Addendum)
Great to see you  We got all the wax out!  2 things I want to talk to your daughter about 1. Potentially increasing your zoloft to 75mg  2. Do we want to repeat your CT scan to follow aneurysm in your chest

## 2019-01-31 NOTE — Assessment & Plan Note (Signed)
S: controlled on Lasix 20 mg alone though could have tighter control with aneurysm history BP Readings from Last 3 Encounters:  01/31/19 135/77  10/12/18 128/64  09/27/18 138/76  A/P: See aneurysm section but I am hesitant to increase strength of blood pressure medicines due to patient fall risk and age.

## 2019-02-03 ENCOUNTER — Telehealth: Payer: Self-pay

## 2019-02-03 NOTE — Telephone Encounter (Signed)
Error

## 2019-02-25 IMAGING — CT CT HIP*R* W/O CM
2 of 3 series · 17 of 46 positions shown, 19 images · non-contrast
Comparison: Right hip radiographs today and 06/05/2009.

CLINICAL DATA: Right hip pain.  No known injury.

EXAM:
CT OF THE RIGHT HIP WITHOUT CONTRAST
TECHNIQUE: Multidetector CT imaging of the right hip was performed according to
the standard protocol. Multiplanar CT image reconstructions were
also generated.

[Series 8: hip 2.0 cor. st · coronal · 0.35mm/px · 3 of 115 slices shown]
[im 39/115  soft-tissue]
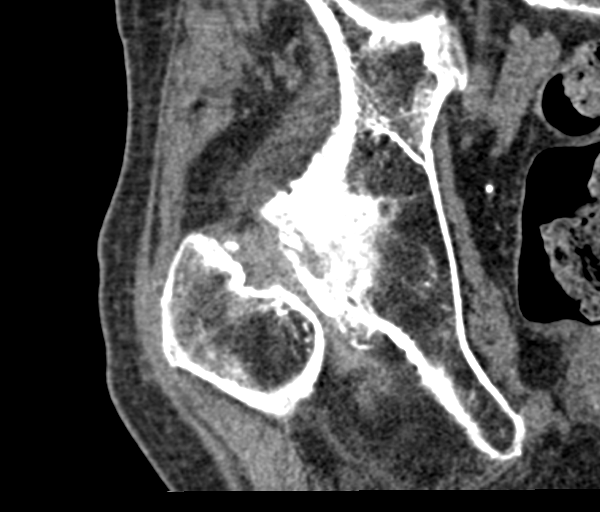
[im 51/115  soft-tissue]
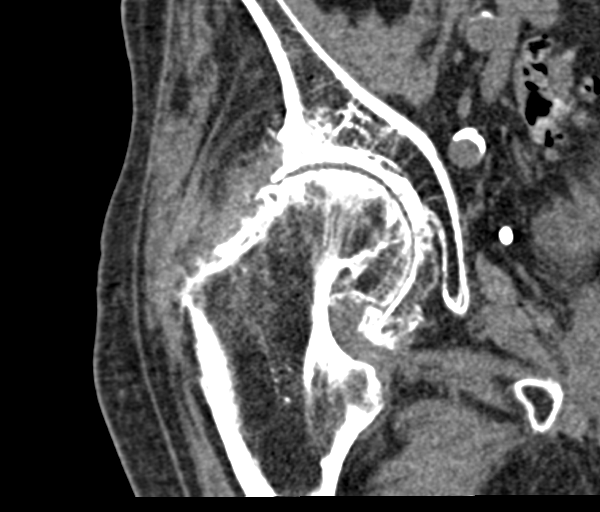
[im 64/115  soft-tissue]
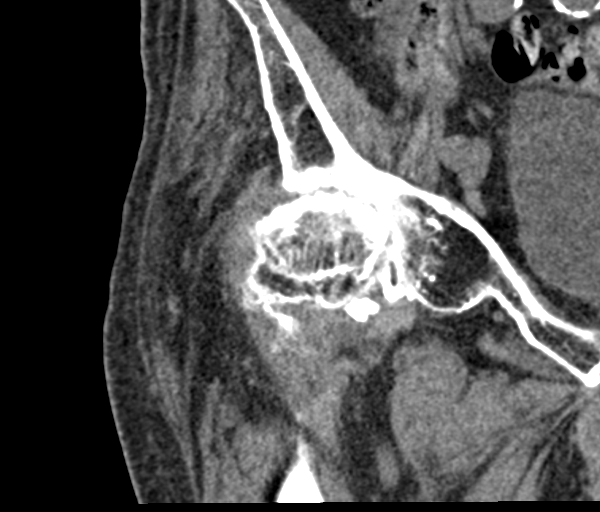

[Series 12: hip 2.0 st2 · axial · 0.55mm/px · z∈[+808,+966]mm · 14 of 91 slices shown, 16 images]
[im 6/91  soft-tissue]
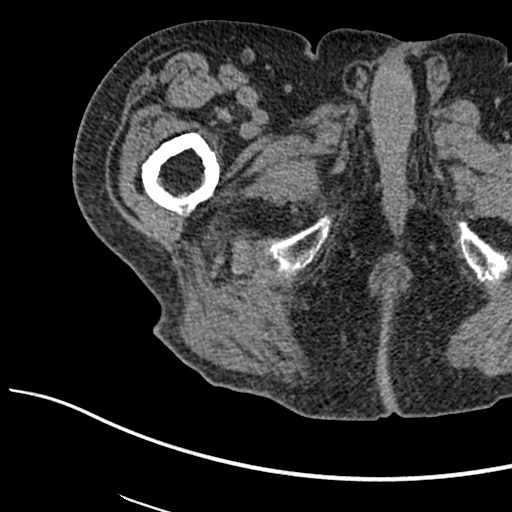
[im 6/91  bone]
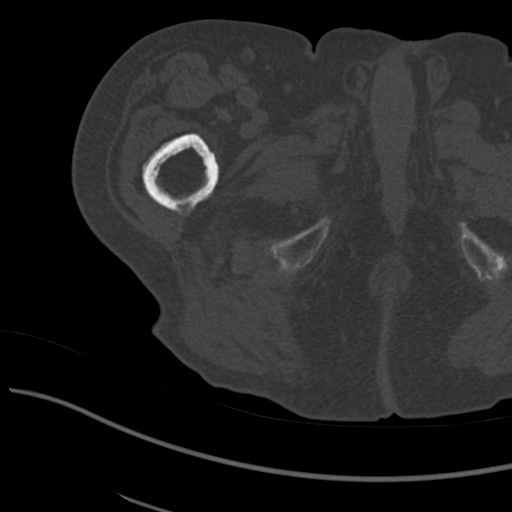
[im 12/91  soft-tissue]
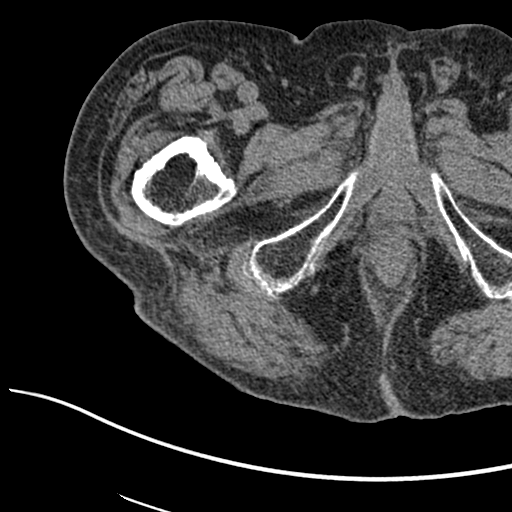
[im 18/91  soft-tissue]
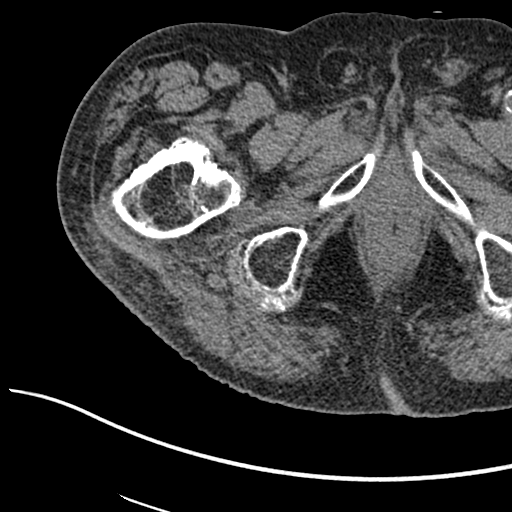
[im 24/91  soft-tissue]
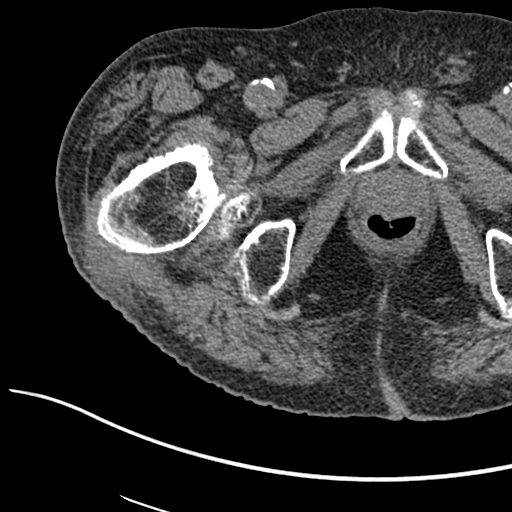
[im 30/91  soft-tissue]
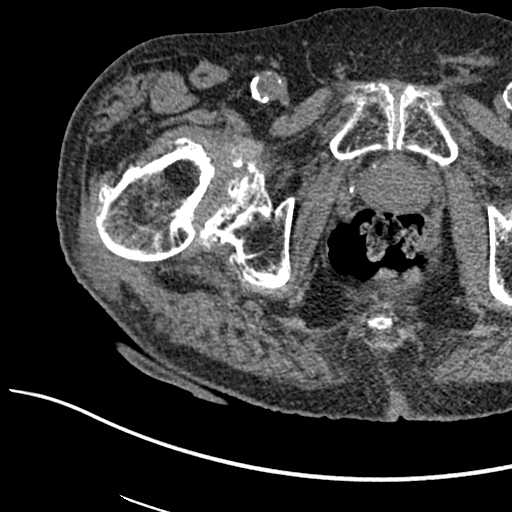
[im 35/91  soft-tissue]
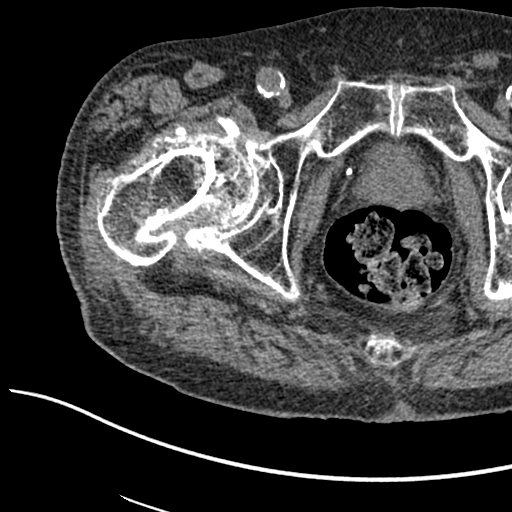
[im 41/91  soft-tissue]
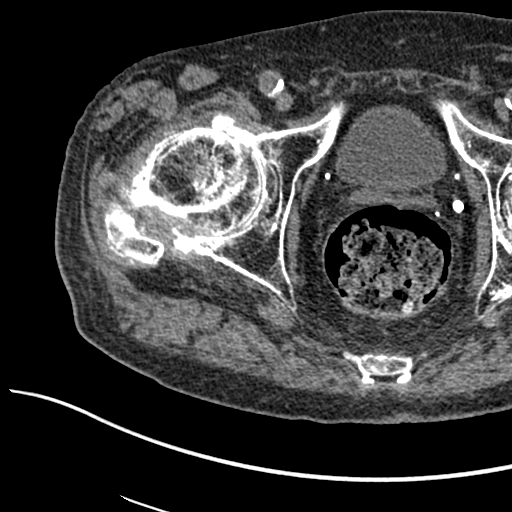
[im 50/91  soft-tissue]
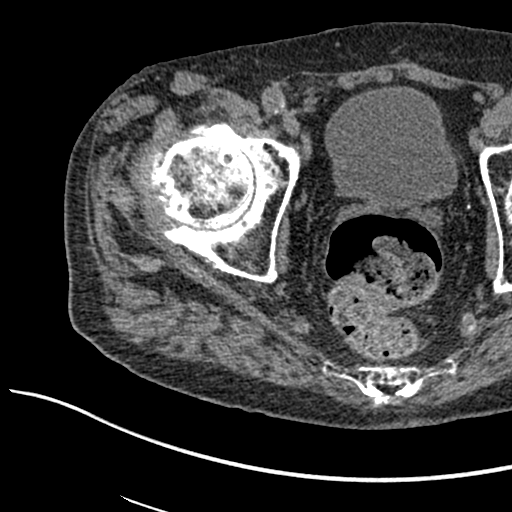
[im 56/91  soft-tissue]
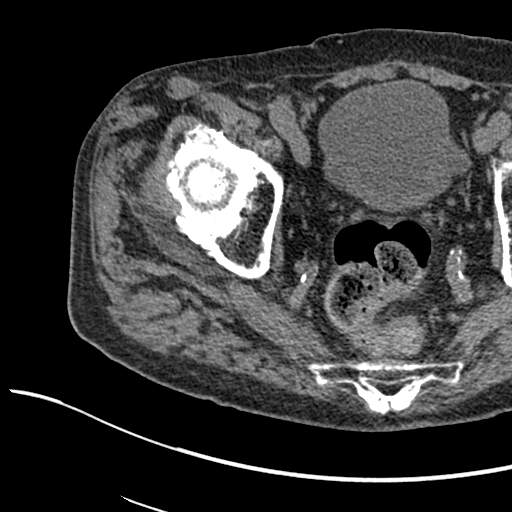
[im 56/91  bone]
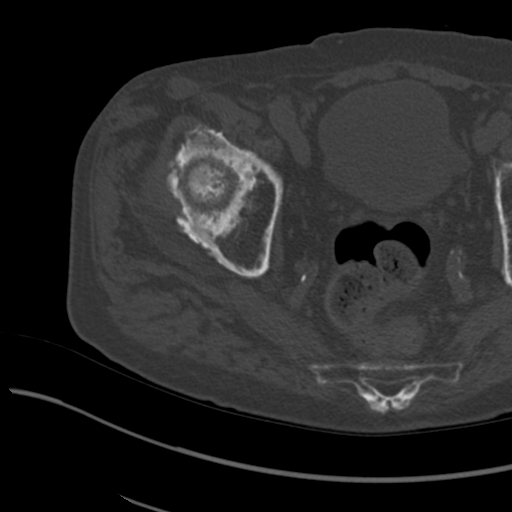
[im 61/91  soft-tissue]
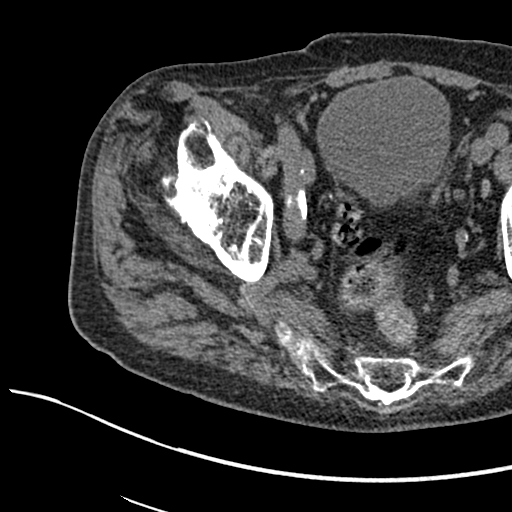
[im 67/91  soft-tissue]
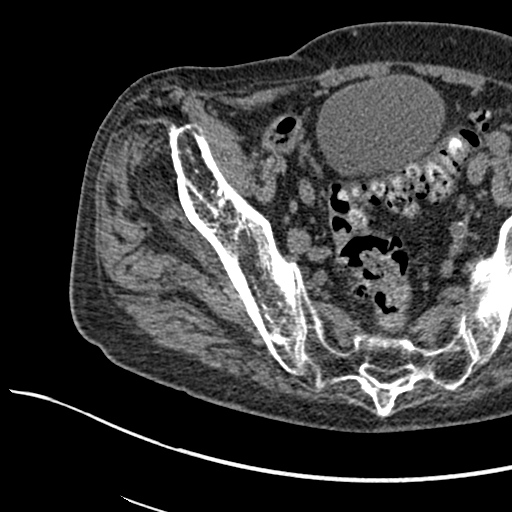
[im 73/91  soft-tissue]
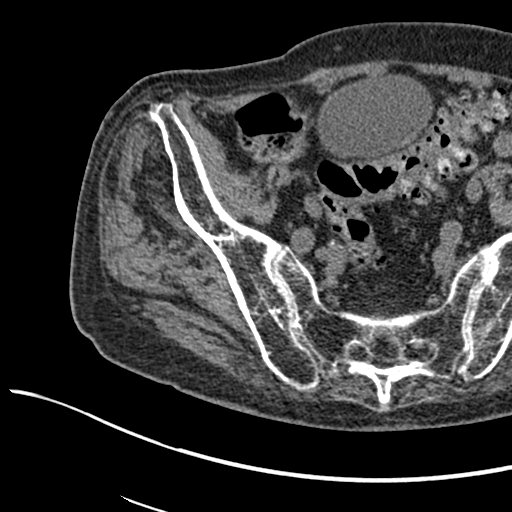
[im 79/91  soft-tissue]
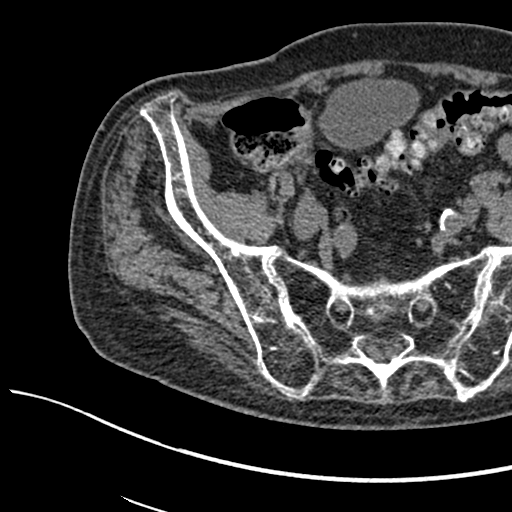
[im 85/91  soft-tissue]
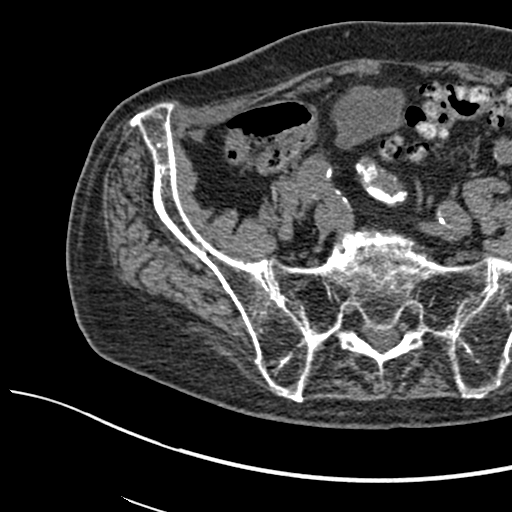

[17 of 46 positions shown; findings below may reference images not displayed]

FINDINGS: Bones/Joint/Cartilage

The bones are diffusely demineralized. There is severe right hip
osteoarthritis with flattening of the femoral head, large
osteophytes, erosions and joint space narrowing. No evidence of
acute fracture, dislocation or bone destruction. There is a small
right hip joint effusion with multiple intra-articular loose bodies.
Left hip osteoarthritis partially imaged.

Ligaments

Suboptimally assessed by CT.

Muscles and Tendons

Mild diffuse atrophy of the right hip musculature.

Soft tissues

Diffuse iliac atherosclerosis and ectasia. Diverticular changes
throughout the distal colon. The urinary bladder is mildly
trabeculated. No focal periarticular fluid collections identified.
IMPRESSION: 1. Severe right hip osteoarthritis with femoral head deformity, but
no evidence of acute fracture or dislocation.
2. No acute or focal soft tissue abnormalities identified.
3. Incidental findings in the pelvis, including iliac
atherosclerosis, colonic diverticulosis and bladder trabeculation.

## 2019-03-08 ENCOUNTER — Other Ambulatory Visit: Payer: Self-pay | Admitting: Family Medicine

## 2019-04-04 ENCOUNTER — Telehealth: Payer: Self-pay

## 2019-04-04 ENCOUNTER — Telehealth: Payer: Self-pay | Admitting: Family Medicine

## 2019-04-04 ENCOUNTER — Other Ambulatory Visit: Payer: Self-pay

## 2019-04-04 MED ORDER — SERTRALINE HCL 50 MG PO TABS: 75.0000 mg | ORAL_TABLET | Freq: Every day | ORAL | 2 refills | Status: DC

## 2019-04-04 NOTE — Addendum Note (Signed)
Addended by: Gwenyth Ober R on: 04/04/2019 11:42 AM   Modules accepted: Orders

## 2019-04-04 NOTE — Telephone Encounter (Signed)
Per notes pt was to speak to daughter about increasing Zoloft from 50 mg to 75mg . Spoke to daughter and she stated she talked to him about the increase and they both agreed on the increase. She stated she has been giving pt 1.5 tablets and it is working well. Pt needs a new Rx. Sending in [place] now electronically. Daughter advised on update.

## 2019-04-04 NOTE — Telephone Encounter (Signed)
Copied from [place] [id number]. Topic: Quick Communication - Rx Refill/Question >> Apr 04, 2019 10:34 AM Parke Poisson wrote: Medication: sertraline (ZOLOFT) 50 MG tablet   Has the patient contacted their pharmacy? No. It was discussed that pt would start taking 75 MG.Needs new prescription    Preferred Pharmacy (with phone number or street name):   Agent: Please be advised that RX refills may take up to 3 business days. We ask that you follow-up with your pharmacy.

## 2019-04-17 ENCOUNTER — Other Ambulatory Visit: Payer: Self-pay | Admitting: Family Medicine

## 2019-04-27 NOTE — Progress Notes (Signed)
I was able to catch daughter Fredrich Birks to discuss issues- we did ultimately increase Zoloft to 75 mg about a month ago- she is not sure if there is been a significant benefit yet.  In regards to aneurysm screening- wants to talk with her mom first but even talking about a CT scan at the hospital she thinks would significantly worsen his anxiety and worsened mood- I do not think the trade off of the additional information likely outweighs the risks to his quality of life at age 85-she will let me know if they decide to move forward but most likely prefer to discuss at future visit/when situation is better with covid-19-potentially after vaccination  Garret Reddish, MD

## 2019-04-28 ENCOUNTER — Ambulatory Visit: Payer: Medicare Other | Admitting: Family Medicine

## 2019-04-28 ENCOUNTER — Telehealth: Payer: Self-pay | Admitting: Family Medicine

## 2019-04-28 NOTE — Telephone Encounter (Signed)
Will have to find next available time that patient has availability and my schedule is open

## 2019-04-28 NOTE — Telephone Encounter (Signed)
Patient is having a lot of pain, especially in back and knees, his wife said he "had a bad night last night." I tried to offer appointments with other providers but pt is insisting on only seeing Dr. Yong Channel but would need to be worked in to be seen as soon as possible. Please advise if Dr. Yong Channel can work in pt either today or as soon as possible. Thank you!

## 2019-04-28 NOTE — Telephone Encounter (Signed)
Spoke to pt to see if he could come in Tues @ 4 pm or Wed @ 840am. Pt declined due to caregiver not being available to help pt until 12-2 pm. Tried to see if pt would like to see another provider again but pt declined. I advised pt that message will be routed to Dr. Yong Channel for advise.

## 2019-04-28 NOTE — Telephone Encounter (Signed)
Please advise 

## 2019-04-29 NOTE — Telephone Encounter (Signed)
Called pt to assist in scheduling. Was told that he does not have his hearing aid in so he will need to call back to schedule.

## 2019-05-09 ENCOUNTER — Encounter: Payer: Self-pay | Admitting: Family Medicine

## 2019-05-09 ENCOUNTER — Ambulatory Visit (INDEPENDENT_AMBULATORY_CARE_PROVIDER_SITE_OTHER): Payer: Medicare Other | Admitting: Family Medicine

## 2019-05-09 ENCOUNTER — Other Ambulatory Visit: Payer: Self-pay

## 2019-05-09 VITALS — BP 120/82 | HR 80 | Temp 97.9°F | Ht 68.5 in | Wt 156.0 lb

## 2019-05-09 DIAGNOSIS — M7062 Trochanteric bursitis, left hip: Secondary | ICD-10-CM

## 2019-05-09 DIAGNOSIS — B351 Tinea unguium: Secondary | ICD-10-CM

## 2019-05-09 DIAGNOSIS — I5030 Unspecified diastolic (congestive) heart failure: Secondary | ICD-10-CM

## 2019-05-09 DIAGNOSIS — E785 Hyperlipidemia, unspecified: Secondary | ICD-10-CM | POA: Diagnosis not present

## 2019-05-09 DIAGNOSIS — Z23 Encounter for immunization: Secondary | ICD-10-CM

## 2019-05-09 DIAGNOSIS — F324 Major depressive disorder, single episode, in partial remission: Secondary | ICD-10-CM

## 2019-05-09 MED ORDER — ESCITALOPRAM OXALATE 5 MG PO TABS: 5.0000 mg | ORAL_TABLET | Freq: Every day | ORAL | 5 refills | Status: DC

## 2019-05-09 NOTE — Patient Instructions (Addendum)
Health Maintenance Due  Topic Date Due  . INFLUENZA VACCINE - today 03/26/2019   Due to bitter taste with sertraline, please stop medication- we will trial escitalopram/lexapro 5 mg instead. We can adjust if needed in the future  We will call you within two weeks about your referral to podiatry ([facility]) [address], [place] [address] Phone: [phone]  . If you do not hear within 3 weeks, give [place] a call.   Please stop by lab before you go If you do not have mychart- we will call you about results within 5 business days of [place] receiving them.  If you have mychart- we will send your results within 3 business days of [place] receiving them.  If abnormal or we want to clarify a result, we will call or mychart you to make sure you receive the message.  If you have questions or concerns or don't hear within 5-7 days, please send [place] a message or call us.

## 2019-05-09 NOTE — Progress Notes (Signed)
Phone [phone]   Subjective:  Roger [name] is a 85 y.o. year old very pleasant male patient who presents for/with See problem oriented charting Chief Complaint  Patient presents with  . Acute Visit  . Back Pain  . Knee Pain   ROS-pain over left greater trochanter has resolved, stable edema, notes enlargement over left great toenail-no pain in this area, no chest pain reported, reports depressed mood  Past Medical History-  Patient Active Problem List   Diagnosis Date Noted  . Ascending aortic aneurysm ([place]) 10/17/2017    Priority: High  . Diastolic congestive heart failure ([place]) 02/05/2016    Priority: High  . Overactive bladder 07/13/2014    Priority: High  . Allergic conjunctivitis of both eyes 06/22/2017    Priority: Medium  . Depression, major, single episode, complete remission ([place]) 07/05/2015    Priority: Medium  . History of pneumonia 03/10/2015    Priority: Medium  . Chronic cough 10/11/2014    Priority: Medium  . CKD (chronic kidney disease), stage III ([place]) 07/13/2014    Priority: Medium  . Syncope 11/17/2013    Priority: Medium  . Hypertension 06/17/2007    Priority: Medium  . Hyperlipidemia 02/22/2007    Priority: Medium  . Aortic atherosclerosis ([place]) 10/17/2017    Priority: Low  . Bronchitis 08/29/2017    Priority: Low  . Venous (peripheral) insufficiency 01/26/2015    Priority: Low  . Macular degeneration 06/19/2011    Priority: Low  . Osteoarthritis 06/05/2009    Priority: Low  . CARCINOMA, SKIN, SQUAMOUS CELL 05/26/2008    Priority: Low  . GERD (gastroesophageal reflux disease) 02/22/2007    Priority: Low  . Right hip pain 09/13/2018  . Laceration of scalp 10/17/2017  . Onychomycosis 10/22/2016    Medications- reviewed and updated Current Outpatient Medications  Medication Sig Dispense Refill  . furosemide (LASIX) 20 MG tablet TAKE 1 TABLET BY MOUTH EVERY DAY 90 tablet 1  . lovastatin (MEVACOR) 40 MG tablet TAKE 1 TABLET BY MOUTH  EVERY DAY 90 tablet 1  . Multiple Vitamins-Minerals (MULTIVITAMIN WITH MINERALS) tablet Take 1 tablet by mouth daily.    [name] omeprazole (PRILOSEC) 20 MG capsule TAKE ONE CAPSULE BY MOUTH TWICE A DAY BEFORE A MEAL 180 capsule 0  . UNABLE TO FIND Med Name: Med pass 120 mL by mouth twice daily for supplement    . escitalopram (LEXAPRO) 5 MG tablet Take 1 tablet (5 mg total) by mouth daily. 30 tablet 5   No current facility-administered medications for this visit.      Objective:  BP 120/82 (BP Location: Left Arm, Patient Position: Sitting, Cuff Size: Normal)   Pulse 80   Temp 97.9 F (36.6 C) (Temporal)   Ht 5' 8.5" (1.74 m)   Wt 156 lb (70.8 kg)   SpO2 95%   BMI 23.37 kg/m  Gen: NAD, resting comfortably CV: RRR no murmurs rubs or gallops Lungs: CTAB no crackles, wheeze, rhonchi Ext: no edema Skin: warm, dry Left great toe nail raised over a centimeter- white/yellow raised toenail- likely onychomycosis-other nails curling around back into the toe    Assessment and Plan   #Left lateral hip pain S: pain over left trochanteric bursa about 3 weeks ago. Today denies back pain though prior notes mentioned back pain. States has resolved at this point. Pain was severe at first.   Started, duration-about 3 weeks ago.  Worse with -going from supine to sitting. Particularly if got down into th ebed.  Relieved by-Improving over time.   Previous Treatment-none A/P: Patient made appointment to discuss left lateral hip pain-fortunately this has largely resolved.  Based off location of pain and very mild lingering tenderness- suspect he had left greater trochanteric bursitis  #Left great toe onychomycosis S:Left great toe onychomychosis.  Patient has noted thickening of his toenails over the years-worse over left great toe- tried to go get his nails trimmed but he was declined due to level of thickness. A/P: Patient appears to have onychomycosis of left great toe-we discussed treating with  Lamisil for 12 weeks- patient is very concerned about potential of worsening depression on Lamisil and declines treatment.  He would like to be referred to podiatry to see if they can trim/shaved down level of thickness and also trim his other toes.  # Depression S: Patient states he does not like sertraline as it seems to taste bitter to him.  He also wonders if it is contributing to constipation.  In addition he does not feel like he has excellent control depression-though COVID-19 and being stuck in for someone who really enjoys people has been extremely trying for him. A/P: After discussion today due to side effects of sertraline-we opted to change to Lexapro 5 mg.  If he has new or worsening symptoms he will let us know- recheck next visit  #hypertension/diastolic CHF or heart failure with preserved ejection fraction S: Blood pressure controlled on Lasix 20 mg daily.  Stable edema.  Unable to get accurate weight today due to wheelchair. BP Readings from Last 3 Encounters:  05/09/19 120/82  01/31/19 135/77  10/12/18 128/64  A/P: Both issues appear to be stable-we will continue current medication-update labs today   Recommended follow up: We did not schedule plan follow-up- 3 months would be reasonable-he typically has PRN needs and will likely see him sooner  Lab/Order associations:   ICD-10-CM   1. Onychomycosis of left great toe  B35.1 Ambulatory referral to Podiatry  2. Major depressive disorder with single episode, in partial remission ([place])  F32.4   3. Diastolic congestive heart failure, unspecified HF chronicity (HCC)  I50.30   4. Hyperlipidemia, unspecified hyperlipidemia type  E78.5 CBC with Differential/Platelet    Comprehensive metabolic panel    Lipid panel  5. Trochanteric bursitis of left hip  M70.62   6. Need for influenza vaccination  Z23 Flu Vaccine QUAD High Dose([facility])    Meds ordered this encounter  Medications  . escitalopram (LEXAPRO) 5 MG tablet    Sig: Take 1  tablet (5 mg total) by mouth daily.    Dispense:  30 tablet    Refill:  5    Return precautions advised.  Garret Reddish, MD

## 2019-05-10 LAB — CBC WITH DIFFERENTIAL/PLATELET
Basophils Absolute: 0.1 10*3/uL (ref 0.0–0.1)
Basophils Relative: 1 % (ref 0.0–3.0)
Eosinophils Absolute: 0.3 10*3/uL (ref 0.0–0.7)
Eosinophils Relative: 3.7 % (ref 0.0–5.0)
HCT: 35.5 % — ABNORMAL LOW (ref 39.0–52.0)
Hemoglobin: 11.7 g/dL — ABNORMAL LOW (ref 13.0–17.0)
Lymphocytes Relative: 21.3 % (ref 12.0–46.0)
Lymphs Abs: 1.8 10*3/uL (ref 0.7–4.0)
MCHC: 32.9 g/dL (ref 30.0–36.0)
MCV: 95.4 fl (ref 78.0–100.0)
Monocytes Absolute: 0.6 10*3/uL (ref 0.1–1.0)
Monocytes Relative: 7.7 % (ref 3.0–12.0)
Neutro Abs: 5.6 10*3/uL (ref 1.4–7.7)
Neutrophils Relative %: 66.3 % (ref 43.0–77.0)
Platelets: 221 10*3/uL (ref 150.0–400.0)
RBC: 3.72 Mil/uL — ABNORMAL LOW (ref 4.22–5.81)
RDW: 13.9 % (ref 11.5–15.5)
WBC: 8.5 10*3/uL (ref 4.0–10.5)

## 2019-05-10 LAB — COMPREHENSIVE METABOLIC PANEL
ALT: 13 U/L (ref 0–53)
AST: 20 U/L (ref 0–37)
Albumin: 4 g/dL (ref 3.5–5.2)
Alkaline Phosphatase: 70 U/L (ref 39–117)
BUN: 24 mg/dL — ABNORMAL HIGH (ref 6–23)
CO2: 28 mEq/L (ref 19–32)
Calcium: 9.3 mg/dL (ref 8.4–10.5)
Chloride: 101 mEq/L (ref 96–112)
Creatinine, Ser: 1.25 mg/dL (ref 0.40–1.50)
GFR: 53.37 mL/min — ABNORMAL LOW (ref >60.00)
Glucose, Bld: 84 mg/dL (ref 70–99)
Potassium: 4.1 mEq/L (ref 3.5–5.1)
Sodium: 139 mEq/L (ref 135–145)
Total Bilirubin: 0.6 mg/dL (ref 0.2–1.2)
Total Protein: 6.5 g/dL (ref 6.0–8.3)

## 2019-05-10 LAB — LIPID PANEL
Cholesterol: 137 mg/dL (ref 0–200)
HDL: 52 mg/dL (ref >39.00)
LDL Cholesterol: 61 mg/dL (ref 0–99)
NonHDL: 84.57
Total CHOL/HDL Ratio: 3
Triglycerides: 118 mg/dL (ref 0.0–149.0)
VLDL: 23.6 mg/dL (ref 0.0–40.0)

## 2019-05-24 ENCOUNTER — Other Ambulatory Visit: Payer: Self-pay

## 2019-05-24 ENCOUNTER — Ambulatory Visit (INDEPENDENT_AMBULATORY_CARE_PROVIDER_SITE_OTHER): Payer: Medicare Other | Admitting: Podiatry

## 2019-05-24 DIAGNOSIS — M79674 Pain in right toe(s): Secondary | ICD-10-CM | POA: Diagnosis not present

## 2019-05-24 DIAGNOSIS — M79675 Pain in left toe(s): Secondary | ICD-10-CM | POA: Diagnosis not present

## 2019-05-24 DIAGNOSIS — B351 Tinea unguium: Secondary | ICD-10-CM

## 2019-05-25 NOTE — Progress Notes (Signed)
Subjective:   Patient ID: Roger [name], male   DOB: 85 y.o.   MRN: [id number]   HPI 85 year old male presents the office today with family number for concerns of thick, painful, elongated toenails that he cannot trim himself.  Nails are painful with pressure in shoes.   Review of Systems  All other systems reviewed and are negative.  Past Medical History:  Diagnosis Date  . GERD 02/22/2007  . HYPERLIPIDEMIA 02/22/2007  . HYPERTENSION 06/17/2007  . MACULAR DEGENERATION 02/22/2007  . OSTEOARTHRITIS, HIP, RIGHT 06/05/2009    Past Surgical History:  Procedure Laterality Date  . LAMINECTOMY  1988     Current Outpatient Medications:  .  escitalopram (LEXAPRO) 5 MG tablet, Take 1 tablet (5 mg total) by mouth daily., Disp: 30 tablet, Rfl: 5 .  furosemide (LASIX) 20 MG tablet, TAKE 1 TABLET BY MOUTH EVERY DAY, Disp: 90 tablet, Rfl: 1 .  lovastatin (MEVACOR) 40 MG tablet, TAKE 1 TABLET BY MOUTH EVERY DAY, Disp: 90 tablet, Rfl: 1 .  Multiple Vitamins-Minerals (MULTIVITAMIN WITH MINERALS) tablet, Take 1 tablet by mouth daily., Disp: , Rfl:  .  omeprazole (PRILOSEC) 20 MG capsule, TAKE ONE CAPSULE BY MOUTH TWICE A DAY BEFORE A MEAL, Disp: 180 capsule, Rfl: 0 .  UNABLE TO FIND, Med Name: Med pass 120 mL by mouth twice daily for supplement, Disp: , Rfl:   Allergies  Allergen Reactions  . Amlodipine Besylate     REACTION: swelling of feet  . Aspirin     REACTION: GI upset can tolerate ecotrin  . Hydrocodone     GI upset      Objective:  Physical Exam  General: AAO x3, NAD  Dermatological: Nails are hypertrophic, dystrophic, brittle, discolored, elongated 10. No surrounding redness or drainage. Tenderness nails 1-5 bilaterally. No open lesions or pre-ulcerative lesions are identified today.  Vascular: Dorsalis Pedis artery and Posterior Tibial artery pedal pulses are 1/4 bilateral with immedate capillary fill time. There is no pain with calf compression, swelling, warmth, erythema.    Neruologic: Grossly intact via light touch bilateral.   Musculoskeletal: No gross boney pedal deformities bilateral. No pain, crepitus, or limitation noted with foot and ankle range of motion bilateral.      Assessment:   Symptomatic onychomycosis     Plan:  -Treatment options discussed including all alternatives, risks, and complications -Etiology of symptoms were discussed -Nails debrided 10 without complications or bleeding. -Daily foot inspection -Follow-up in 3 months or sooner if any problems arise. In the meantime, encouraged to call the office with any questions, concerns, change in symptoms.   Celesta Gentile, DPM

## 2019-06-04 ENCOUNTER — Other Ambulatory Visit: Payer: Self-pay | Admitting: Family Medicine

## 2019-06-10 ENCOUNTER — Ambulatory Visit: Payer: Medicare Other | Admitting: Family Medicine

## 2019-06-15 ENCOUNTER — Ambulatory Visit: Payer: Medicare Other | Admitting: Family Medicine

## 2019-06-22 ENCOUNTER — Encounter: Payer: Self-pay | Admitting: Family Medicine

## 2019-06-22 ENCOUNTER — Ambulatory Visit (INDEPENDENT_AMBULATORY_CARE_PROVIDER_SITE_OTHER): Payer: Medicare Other | Admitting: Family Medicine

## 2019-06-22 VITALS — Ht 68.5 in | Wt 156.0 lb

## 2019-06-22 DIAGNOSIS — R053 Chronic cough: Secondary | ICD-10-CM

## 2019-06-22 DIAGNOSIS — R531 Weakness: Secondary | ICD-10-CM | POA: Diagnosis not present

## 2019-06-22 DIAGNOSIS — R1313 Dysphagia, pharyngeal phase: Secondary | ICD-10-CM

## 2019-06-22 DIAGNOSIS — R05 Cough: Secondary | ICD-10-CM | POA: Diagnosis not present

## 2019-06-22 MED ORDER — BENZONATATE 100 MG PO CAPS: 100.0000 mg | ORAL_CAPSULE | Freq: Two times a day (BID) | ORAL | 0 refills | Status: DC | PRN

## 2019-06-22 NOTE — Progress Notes (Signed)
Phone [phone]   Subjective:  Virtual visit via Video note. Chief complaint: Chief Complaint  Patient presents with  . Cough    on going     This visit type was conducted due to national recommendations for restrictions regarding the COVID-19 Pandemic (e.g. social distancing).  This format is felt to be most appropriate for this patient at this time balancing risks to patient and risks to population by having him in for in person visit.  No physical exam was performed (except for noted visual exam or audio findings with Telehealth visits).    Our team/I connected with Gwen Her Tiger at 11:40 AM EDT by a video enabled telemedicine application (doxy.me or caregility through epic) and verified that I am speaking with the correct person using two identifiers.  Location patient: Home-[facility] Location provider: [facility], office Persons participating in the virtual visit:  patient  Our team/I discussed the limitations of evaluation and management by telemedicine and the availability of in person appointments. In light of current covid-19 pandemic, patient also understands that we are trying to protect them by minimizing in office contact if at all possible.  The patient expressed consent for telemedicine visit and agreed to proceed. Patient understands insurance will be billed.   ROS- Review of Systems  Constitutional: Negative.   HENT: Positive for sore throat.   Eyes: Negative.   Respiratory: Negative.   Cardiovascular: Negative.   Gastrointestinal: Negative.   Genitourinary: Negative.   Musculoskeletal: Negative.   Skin: Negative.   Neurological: Negative.   Endo/Heme/Allergies: Negative.   Psychiatric/Behavioral: Negative.      Past Medical History-  Patient Active Problem List   Diagnosis Date Noted  . Ascending aortic aneurysm ([place]) 10/17/2017    Priority: High  . Diastolic congestive heart failure ([place]) 02/05/2016    Priority: High  . Overactive bladder 07/13/2014   Priority: High  . Allergic conjunctivitis of both eyes 06/22/2017    Priority: Medium  . Depression, major, single episode, complete remission ([place]) 07/05/2015    Priority: Medium  . History of pneumonia 03/10/2015    Priority: Medium  . Chronic cough 10/11/2014    Priority: Medium  . CKD (chronic kidney disease), stage III ([place]) 07/13/2014    Priority: Medium  . Syncope 11/17/2013    Priority: Medium  . Hypertension 06/17/2007    Priority: Medium  . Hyperlipidemia 02/22/2007    Priority: Medium  . Aortic atherosclerosis ([place]) 10/17/2017    Priority: Low  . Bronchitis 08/29/2017    Priority: Low  . Venous (peripheral) insufficiency 01/26/2015    Priority: Low  . Macular degeneration 06/19/2011    Priority: Low  . Osteoarthritis 06/05/2009    Priority: Low  . CARCINOMA, SKIN, SQUAMOUS CELL 05/26/2008    Priority: Low  . GERD (gastroesophageal reflux disease) 02/22/2007    Priority: Low  . Right hip pain 09/13/2018  . Laceration of scalp 10/17/2017  . Onychomycosis 10/22/2016    Medications- reviewed and updated Current Outpatient Medications  Medication Sig Dispense Refill  . escitalopram (LEXAPRO) 5 MG tablet TAKE 1 TABLET BY MOUTH EVERY DAY 90 tablet 2  . furosemide (LASIX) 20 MG tablet TAKE 1 TABLET BY MOUTH EVERY DAY 90 tablet 1  . lovastatin (MEVACOR) 40 MG tablet TAKE 1 TABLET BY MOUTH EVERY DAY 90 tablet 1  . Multiple Vitamins-Minerals (MULTIVITAMIN WITH MINERALS) tablet Take 1 tablet by mouth daily.    [name] omeprazole (PRILOSEC) 20 MG capsule TAKE ONE CAPSULE BY MOUTH TWICE A DAY  BEFORE A MEAL 180 capsule 0  . UNABLE TO FIND Med Name: Med pass 120 mL by mouth twice daily for supplement    . benzonatate (TESSALON) 100 MG capsule Take 1 capsule (100 mg total) by mouth 2 (two) times daily as needed for cough. 30 capsule 0   No current facility-administered medications for this visit.      Objective:  Ht 5' 8.5" (1.74 m)   Wt 156 lb (70.8 kg)   BMI 23.37 kg/m   self reported vitals Gen: NAD, resting comfortably, did not cough during visit Lungs: nonlabored, normal respiratory rate  Skin: appears dry, no obvious rash     Assessment and Plan   # Chronic cough/generalized weakness S: Patient complains of continued issues with chronic cough/issues with clearing phlegm.  Also interested in knowing if allergies contributing. AC was off for 5 days and didn't note the cough.   Patient has had issues for over 5 years.  Has seen ENT Dr. Lucia Gaskins who did not think sinus related.  Has seen Dr. Melvyn Novas of pulmonary as recently as [place] was for modified barium swallow study.  Patient was continued on PPI for potential GERD element.  Patient had not responded well to allergy medicine in the past.  Actually had thickened secretions on Mucinex.  After that visit with Dr. Melvyn Novas -patient did stop mint candy.  Patient cut back on red wine and that seemed to help-he has begun reintroducing red wine unfortunately-has been advised white wine if strongly desired but patient states he has frequent urination with this.  Bread is a big issue for patient but he does continue to consume this per daughter.  Coughing continues to be worse after meals.  Fortunately with Dr. Gustavus Bryant interventions patient states his coughing spells are down to an hour or so instead of up to 5 hours. Able to control cough most of day then has prolonged coughing often times around dinnertime.  Patient is compliant with omeprazole 20 mg twice a day. Follow-up got pushed out because of COVID-19.  Patient also complains of nose ring when he coughs.  No chest pain or shortness of breath reported today.  Patient did have a modified barium swallow study on September 01, 2017-see report under imaging-patient states he does not want to repeat at this time.  Daughter states patient could benefit from prior advice being reiterated by speech therapist-they request home health referral.  Daughter has also noted some  generalized weakness since COVID-19 and reduced ability to get out of the home.  Patient had physical therapy in the past and found this beneficial.  Patient is somewhat reluctant to have PT in the home but daughter thinks he would very much so benefit-in the end he agrees  A/P: Patient with continued issues with chronic cough-pharyngeal dysphagia likely contributes.  GERD likely contributes.  Recommend avoiding tomato sauces and breads and red wine which seem to be triggers for him.  Patient has seen improvement in the past when seen Dr. Lia Hopping referred patient back today.  Patient request not to repeat swallow study as had in January 2019 but he would like to have recommendations reinforced by SLP-referral was placed today.  Since we are ordering home health and he is having some generalized weakness-we also ordered physical therapy to try to get patient moving more. -He would like to try Xyzal for potential allergy element although I told him I did not think this was the leading cause of his symptoms -we discussed had failed  other antihistamines in the past -He would like to have something as needed for cough-Tessalon was sent in.  Recommended follow up: Patient is scheduled for follow-up with me in November it appears he would also like to have the ears cleaned out at that time  Future Appointments  Date Time Provider [place]  06/30/2019  2:40 PM [name], MD [facility]  08/09/2019  1:45 PM Jacqualyn Posey Bonna Gains, DPM [facility]   Lab/Order associations:   ICD-10-CM   1. Chronic cough  R05 Ambulatory referral to Pulmonology  2. Pharyngeal dysphagia  R13.13 Ambulatory referral to Home Health  3. Generalized weakness  R53.1 Ambulatory referral to [facility] ordered this encounter  Medications  . benzonatate (TESSALON) 100 MG capsule    Sig: Take 1 capsule (100 mg total) by mouth 2 (two) times daily as needed for cough.    Dispense:  30 capsule    Refill:   0    Return precautions advised.  Garret Reddish, MD

## 2019-06-22 NOTE — Patient Instructions (Signed)
There are no preventive care reminders to display for this patient.  - Flu shot today - High dose flu shot today - declines for this season - will complete later in flu season (please let us know if you get this at another location so we can update your chart)

## 2019-06-30 ENCOUNTER — Telehealth: Payer: Self-pay

## 2019-06-30 ENCOUNTER — Ambulatory Visit: Payer: Medicare Other | Admitting: Family Medicine

## 2019-07-11 ENCOUNTER — Telehealth: Payer: Self-pay | Admitting: Physical Therapy

## 2019-07-11 NOTE — Telephone Encounter (Signed)
Copied from [place] [id number]. Topic: General - Other >> Jul 11, 2019  8:06 AM Jodie Echevaria wrote: Reason for CRM: Patient daughter Fredrich Birks called to request to speak with the nurse. She states that she need to discuss a recently changed medication. States that the new antidepressant  medication is not working and she want to discuss changing it back. Please call [name] at Ph#  [phone]

## 2019-07-11 NOTE — Telephone Encounter (Signed)
Pt daughter states that the new antidepressant  And she thinks the dosage or the medicine is working. He has become aggressive, abusive and miserable. Pt daughter would like to know why the zoloft was stopped or switched and would like to know if he could be put back on the zoloft.

## 2019-07-11 NOTE — Telephone Encounter (Signed)
From last note  "# Depression S: Patient states he does not like sertraline as it seems to taste bitter to him.  He also wonders if it is contributing to constipation.  In addition he does not feel like he has excellent control depression-though COVID-19 and being stuck in for someone who really enjoys people has been extremely trying for him. A/P: After discussion today due to side effects of sertraline-we opted to change to Lexapro 5 mg.  If he has new or worsening symptoms he will let us know- recheck next visit "  If patient and family would like to try Zoloft again-I am okay with restarting Zoloft/sertraline may restart Zoloft 50 mg tablets-take 1.5 tablets daily #135 with 3 refills-recommend 6-week likely video visit to check in after the change.  Stop the Lexapro 5 mg if they want to make this change

## 2019-07-12 ENCOUNTER — Other Ambulatory Visit: Payer: Self-pay

## 2019-07-12 MED ORDER — SERTRALINE HCL 50 MG PO TABS: ORAL_TABLET | ORAL | 3 refills | Status: DC

## 2019-07-12 NOTE — Telephone Encounter (Signed)
Called patient daughter reviewed all information. They have chosen to go back on zoloft. I have called in and removed lexapro. I have also made virtual visit for 6 week f/u on meds. She will call if any issues questions or if no improvement in symptoms.

## 2019-07-15 ENCOUNTER — Telehealth: Payer: Self-pay | Admitting: Family Medicine

## 2019-07-15 NOTE — Telephone Encounter (Signed)
Home Health Verbal Orders - Caller/Agency: Felicita Gage Number: [phone] Requesting OT/PT/Skilled Nursing/Social Work/Speech Therapy: Pt requested to delay his PT until monday Frequency:

## 2019-07-18 NOTE — Telephone Encounter (Signed)
See note  Copied from [place] [id number]. Topic: General - Inquiry >> Jul 18, 2019  2:55 PM Oneta Rack wrote: Osvaldo Human name: Liji  Relation to pt: PT from [facility]  Call back number: [phone]    Reason for call: wanted to inform PCP patient would decline start of care visit today and would like to resume for 07/19/2019.

## 2019-07-18 NOTE — Telephone Encounter (Signed)
See note

## 2019-07-18 NOTE — Telephone Encounter (Signed)
Noted thanks °

## 2019-07-18 NOTE — Telephone Encounter (Signed)
FYI

## 2019-07-19 DIAGNOSIS — H3530 Unspecified macular degeneration: Secondary | ICD-10-CM | POA: Diagnosis not present

## 2019-07-19 DIAGNOSIS — I872 Venous insufficiency (chronic) (peripheral): Secondary | ICD-10-CM | POA: Diagnosis not present

## 2019-07-19 DIAGNOSIS — M1990 Unspecified osteoarthritis, unspecified site: Secondary | ICD-10-CM | POA: Diagnosis not present

## 2019-07-19 DIAGNOSIS — I700 Atherosclerosis of aorta: Secondary | ICD-10-CM | POA: Diagnosis not present

## 2019-07-19 DIAGNOSIS — I712 Thoracic aortic aneurysm, without rupture: Secondary | ICD-10-CM | POA: Diagnosis not present

## 2019-07-19 DIAGNOSIS — K219 Gastro-esophageal reflux disease without esophagitis: Secondary | ICD-10-CM | POA: Diagnosis not present

## 2019-07-19 DIAGNOSIS — R1312 Dysphagia, oropharyngeal phase: Secondary | ICD-10-CM | POA: Diagnosis not present

## 2019-07-19 DIAGNOSIS — N183 Chronic kidney disease, stage 3 unspecified: Secondary | ICD-10-CM | POA: Diagnosis not present

## 2019-07-19 DIAGNOSIS — M25561 Pain in right knee: Secondary | ICD-10-CM | POA: Diagnosis not present

## 2019-07-19 DIAGNOSIS — J40 Bronchitis, not specified as acute or chronic: Secondary | ICD-10-CM | POA: Diagnosis not present

## 2019-07-19 DIAGNOSIS — I5030 Unspecified diastolic (congestive) heart failure: Secondary | ICD-10-CM | POA: Diagnosis not present

## 2019-07-19 DIAGNOSIS — Z9181 History of falling: Secondary | ICD-10-CM | POA: Diagnosis not present

## 2019-07-19 DIAGNOSIS — I130 Hypertensive heart and chronic kidney disease with heart failure and stage 1 through stage 4 chronic kidney disease, or unspecified chronic kidney disease: Secondary | ICD-10-CM | POA: Diagnosis not present

## 2019-07-19 DIAGNOSIS — R531 Weakness: Secondary | ICD-10-CM | POA: Diagnosis not present

## 2019-07-22 DIAGNOSIS — I130 Hypertensive heart and chronic kidney disease with heart failure and stage 1 through stage 4 chronic kidney disease, or unspecified chronic kidney disease: Secondary | ICD-10-CM | POA: Diagnosis not present

## 2019-07-22 DIAGNOSIS — R531 Weakness: Secondary | ICD-10-CM | POA: Diagnosis not present

## 2019-07-22 DIAGNOSIS — N183 Chronic kidney disease, stage 3 unspecified: Secondary | ICD-10-CM | POA: Diagnosis not present

## 2019-07-22 DIAGNOSIS — J40 Bronchitis, not specified as acute or chronic: Secondary | ICD-10-CM | POA: Diagnosis not present

## 2019-07-22 DIAGNOSIS — H3530 Unspecified macular degeneration: Secondary | ICD-10-CM | POA: Diagnosis not present

## 2019-07-22 DIAGNOSIS — I700 Atherosclerosis of aorta: Secondary | ICD-10-CM | POA: Diagnosis not present

## 2019-07-22 DIAGNOSIS — I872 Venous insufficiency (chronic) (peripheral): Secondary | ICD-10-CM | POA: Diagnosis not present

## 2019-07-22 DIAGNOSIS — I712 Thoracic aortic aneurysm, without rupture: Secondary | ICD-10-CM | POA: Diagnosis not present

## 2019-07-22 DIAGNOSIS — R1312 Dysphagia, oropharyngeal phase: Secondary | ICD-10-CM | POA: Diagnosis not present

## 2019-07-22 DIAGNOSIS — K219 Gastro-esophageal reflux disease without esophagitis: Secondary | ICD-10-CM | POA: Diagnosis not present

## 2019-07-22 DIAGNOSIS — I5030 Unspecified diastolic (congestive) heart failure: Secondary | ICD-10-CM | POA: Diagnosis not present

## 2019-07-22 DIAGNOSIS — M25561 Pain in right knee: Secondary | ICD-10-CM | POA: Diagnosis not present

## 2019-07-22 DIAGNOSIS — Z9181 History of falling: Secondary | ICD-10-CM | POA: Diagnosis not present

## 2019-07-22 DIAGNOSIS — M1990 Unspecified osteoarthritis, unspecified site: Secondary | ICD-10-CM | POA: Diagnosis not present

## 2019-07-25 ENCOUNTER — Telehealth: Payer: Self-pay | Admitting: Family Medicine

## 2019-07-25 NOTE — Telephone Encounter (Signed)
See note

## 2019-07-25 NOTE — Telephone Encounter (Signed)
Copied from [place] [id number]. Topic: Quick Communication - Home Health Verbal Orders >> Jul 25, 2019  1:55 PM Leward Quan A wrote: Caller/Agency: Deirdre / [facility] Number: [phone] ok to LM  Requesting OT/PT/Skilled Nursing/Social Work/Speech Therapy: Speech Therapy,  Home health  Frequency: 1 w 1, 2 w 3

## 2019-07-25 NOTE — Telephone Encounter (Signed)
Called and lm providing VO.

## 2019-07-26 ENCOUNTER — Telehealth: Payer: Self-pay | Admitting: Family Medicine

## 2019-07-26 DIAGNOSIS — I712 Thoracic aortic aneurysm, without rupture: Secondary | ICD-10-CM | POA: Diagnosis not present

## 2019-07-26 DIAGNOSIS — K219 Gastro-esophageal reflux disease without esophagitis: Secondary | ICD-10-CM | POA: Diagnosis not present

## 2019-07-26 DIAGNOSIS — I700 Atherosclerosis of aorta: Secondary | ICD-10-CM | POA: Diagnosis not present

## 2019-07-26 DIAGNOSIS — I130 Hypertensive heart and chronic kidney disease with heart failure and stage 1 through stage 4 chronic kidney disease, or unspecified chronic kidney disease: Secondary | ICD-10-CM | POA: Diagnosis not present

## 2019-07-26 DIAGNOSIS — I5030 Unspecified diastolic (congestive) heart failure: Secondary | ICD-10-CM | POA: Diagnosis not present

## 2019-07-26 DIAGNOSIS — Z9181 History of falling: Secondary | ICD-10-CM | POA: Diagnosis not present

## 2019-07-26 DIAGNOSIS — M25561 Pain in right knee: Secondary | ICD-10-CM | POA: Diagnosis not present

## 2019-07-26 DIAGNOSIS — M1990 Unspecified osteoarthritis, unspecified site: Secondary | ICD-10-CM | POA: Diagnosis not present

## 2019-07-26 DIAGNOSIS — R1312 Dysphagia, oropharyngeal phase: Secondary | ICD-10-CM | POA: Diagnosis not present

## 2019-07-26 DIAGNOSIS — I872 Venous insufficiency (chronic) (peripheral): Secondary | ICD-10-CM | POA: Diagnosis not present

## 2019-07-26 DIAGNOSIS — H3530 Unspecified macular degeneration: Secondary | ICD-10-CM | POA: Diagnosis not present

## 2019-07-26 DIAGNOSIS — N183 Chronic kidney disease, stage 3 unspecified: Secondary | ICD-10-CM | POA: Diagnosis not present

## 2019-07-26 DIAGNOSIS — J40 Bronchitis, not specified as acute or chronic: Secondary | ICD-10-CM | POA: Diagnosis not present

## 2019-07-26 DIAGNOSIS — R531 Weakness: Secondary | ICD-10-CM | POA: Diagnosis not present

## 2019-07-26 NOTE — Telephone Encounter (Signed)
Medication Refill - Medication: Requesting to discontinue benzonatate (TESSALON) 100 MG capsule pt experiencing adverse side effects  REQUESTING Rx for post nasal drip  Has the patient contacted their pharmacy? Yes.   (Agent: If no, request that the patient contact the pharmacy for the refill.) (Agent: If yes, when and what did the pharmacy advise?)  Preferred Pharmacy (with phone number or street name):  [facility], [address] [place] [address]  Phone: [phone] Fax: [phone]     Agent: Please be advised that RX refills may take up to 3 business days. We ask that you follow-up with your pharmacy.

## 2019-07-26 NOTE — Telephone Encounter (Signed)
See request °

## 2019-07-26 NOTE — Telephone Encounter (Signed)
Copied from [place] [phone]. Topic: Quick Communication - Home Health Verbal Orders >> Jul 26, 2019  3:57 PM Virl Axe D wrote: Caller/AgencyOval Linsey Number: [phone] Requesting OT/PT/Skilled Nursing/Social Work/Speech Therapy: PT Frequency: 2 week 6/ 1 week 2 starting this week

## 2019-07-27 ENCOUNTER — Other Ambulatory Visit: Payer: Self-pay

## 2019-07-27 MED ORDER — BENZONATATE 100 MG PO CAPS: 100.0000 mg | ORAL_CAPSULE | Freq: Two times a day (BID) | ORAL | 0 refills | Status: DC | PRN

## 2019-07-28 DIAGNOSIS — I872 Venous insufficiency (chronic) (peripheral): Secondary | ICD-10-CM | POA: Diagnosis not present

## 2019-07-28 DIAGNOSIS — N183 Chronic kidney disease, stage 3 unspecified: Secondary | ICD-10-CM | POA: Diagnosis not present

## 2019-07-28 DIAGNOSIS — I712 Thoracic aortic aneurysm, without rupture: Secondary | ICD-10-CM | POA: Diagnosis not present

## 2019-07-28 DIAGNOSIS — H3530 Unspecified macular degeneration: Secondary | ICD-10-CM | POA: Diagnosis not present

## 2019-07-28 DIAGNOSIS — I130 Hypertensive heart and chronic kidney disease with heart failure and stage 1 through stage 4 chronic kidney disease, or unspecified chronic kidney disease: Secondary | ICD-10-CM | POA: Diagnosis not present

## 2019-07-28 DIAGNOSIS — R1312 Dysphagia, oropharyngeal phase: Secondary | ICD-10-CM | POA: Diagnosis not present

## 2019-07-28 DIAGNOSIS — M25561 Pain in right knee: Secondary | ICD-10-CM | POA: Diagnosis not present

## 2019-07-28 DIAGNOSIS — Z9181 History of falling: Secondary | ICD-10-CM | POA: Diagnosis not present

## 2019-07-28 DIAGNOSIS — I700 Atherosclerosis of aorta: Secondary | ICD-10-CM | POA: Diagnosis not present

## 2019-07-28 DIAGNOSIS — M1990 Unspecified osteoarthritis, unspecified site: Secondary | ICD-10-CM | POA: Diagnosis not present

## 2019-07-28 DIAGNOSIS — I5030 Unspecified diastolic (congestive) heart failure: Secondary | ICD-10-CM | POA: Diagnosis not present

## 2019-07-28 DIAGNOSIS — R531 Weakness: Secondary | ICD-10-CM | POA: Diagnosis not present

## 2019-07-28 DIAGNOSIS — K219 Gastro-esophageal reflux disease without esophagitis: Secondary | ICD-10-CM | POA: Diagnosis not present

## 2019-07-28 DIAGNOSIS — J40 Bronchitis, not specified as acute or chronic: Secondary | ICD-10-CM | POA: Diagnosis not present

## 2019-07-29 ENCOUNTER — Other Ambulatory Visit: Payer: Self-pay | Admitting: Family Medicine

## 2019-07-29 MED ORDER — LOVASTATIN 40 MG PO TABS: 40.0000 mg | ORAL_TABLET | Freq: Every day | ORAL | 1 refills | Status: DC

## 2019-07-29 MED ORDER — OMEPRAZOLE 20 MG PO CPDR: DELAYED_RELEASE_CAPSULE | ORAL | 0 refills | Status: DC

## 2019-07-29 MED ORDER — FUROSEMIDE 20 MG PO TABS: 20.0000 mg | ORAL_TABLET | Freq: Every day | ORAL | 1 refills | Status: DC

## 2019-07-29 NOTE — Telephone Encounter (Signed)
Copied from [place] [id number]. Topic: Quick Communication - Rx Refill/Question >> Jul 29, 2019 12:00 PM Leward Quan A wrote: Medication: furosemide (LASIX) 20 MG tablet, lovastatin (MEVACOR) 40 MG tablet, omeprazole (PRILOSEC) 20 MG capsule, sertraline (ZOLOFT) 50 MG tablet   New pharmacy need ne Rx   Has the patient contacted their pharmacy? Yes.   (Agent: If no, request that the patient contact the pharmacy for the refill.) (Agent: If yes, when and what did the pharmacy advise?)  Preferred Pharmacy (with phone number or street name): [facility], [place] [phone] (Phone) [phone] (Fax    Agent: Please be advised that RX refills may take up to 3 business days. We ask that you follow-up with your pharmacy.

## 2019-08-02 DIAGNOSIS — R1312 Dysphagia, oropharyngeal phase: Secondary | ICD-10-CM | POA: Diagnosis not present

## 2019-08-02 DIAGNOSIS — I5030 Unspecified diastolic (congestive) heart failure: Secondary | ICD-10-CM | POA: Diagnosis not present

## 2019-08-02 DIAGNOSIS — I872 Venous insufficiency (chronic) (peripheral): Secondary | ICD-10-CM | POA: Diagnosis not present

## 2019-08-02 DIAGNOSIS — J40 Bronchitis, not specified as acute or chronic: Secondary | ICD-10-CM | POA: Diagnosis not present

## 2019-08-02 DIAGNOSIS — R531 Weakness: Secondary | ICD-10-CM | POA: Diagnosis not present

## 2019-08-02 DIAGNOSIS — K219 Gastro-esophageal reflux disease without esophagitis: Secondary | ICD-10-CM | POA: Diagnosis not present

## 2019-08-02 DIAGNOSIS — I700 Atherosclerosis of aorta: Secondary | ICD-10-CM | POA: Diagnosis not present

## 2019-08-02 DIAGNOSIS — H3530 Unspecified macular degeneration: Secondary | ICD-10-CM | POA: Diagnosis not present

## 2019-08-02 DIAGNOSIS — I712 Thoracic aortic aneurysm, without rupture: Secondary | ICD-10-CM | POA: Diagnosis not present

## 2019-08-02 DIAGNOSIS — Z9181 History of falling: Secondary | ICD-10-CM | POA: Diagnosis not present

## 2019-08-02 DIAGNOSIS — M25561 Pain in right knee: Secondary | ICD-10-CM | POA: Diagnosis not present

## 2019-08-02 DIAGNOSIS — N183 Chronic kidney disease, stage 3 unspecified: Secondary | ICD-10-CM | POA: Diagnosis not present

## 2019-08-02 DIAGNOSIS — I130 Hypertensive heart and chronic kidney disease with heart failure and stage 1 through stage 4 chronic kidney disease, or unspecified chronic kidney disease: Secondary | ICD-10-CM | POA: Diagnosis not present

## 2019-08-02 DIAGNOSIS — M1990 Unspecified osteoarthritis, unspecified site: Secondary | ICD-10-CM | POA: Diagnosis not present

## 2019-08-02 NOTE — Telephone Encounter (Signed)
Home Health Verbal Orders - Caller/Agency: Liji/[facility]/[phone] Requesting OT/PT/Skilled Nursing/Social Work/Speech Therapy: PT Frequency: 1x1wk, 2x6wk, 1x2wk beginning on 11/24  Please call to confirm.

## 2019-08-02 NOTE — Telephone Encounter (Signed)
See note

## 2019-08-03 ENCOUNTER — Other Ambulatory Visit: Payer: Self-pay

## 2019-08-03 MED ORDER — SERTRALINE HCL 50 MG PO TABS: ORAL_TABLET | ORAL | 3 refills | Status: DC

## 2019-08-03 NOTE — Telephone Encounter (Signed)
Returned Liji call and provided verbal order.

## 2019-08-04 DIAGNOSIS — Z9181 History of falling: Secondary | ICD-10-CM | POA: Diagnosis not present

## 2019-08-04 DIAGNOSIS — H3530 Unspecified macular degeneration: Secondary | ICD-10-CM | POA: Diagnosis not present

## 2019-08-04 DIAGNOSIS — M1990 Unspecified osteoarthritis, unspecified site: Secondary | ICD-10-CM | POA: Diagnosis not present

## 2019-08-04 DIAGNOSIS — I130 Hypertensive heart and chronic kidney disease with heart failure and stage 1 through stage 4 chronic kidney disease, or unspecified chronic kidney disease: Secondary | ICD-10-CM | POA: Diagnosis not present

## 2019-08-04 DIAGNOSIS — I872 Venous insufficiency (chronic) (peripheral): Secondary | ICD-10-CM | POA: Diagnosis not present

## 2019-08-04 DIAGNOSIS — I700 Atherosclerosis of aorta: Secondary | ICD-10-CM | POA: Diagnosis not present

## 2019-08-04 DIAGNOSIS — J40 Bronchitis, not specified as acute or chronic: Secondary | ICD-10-CM | POA: Diagnosis not present

## 2019-08-04 DIAGNOSIS — N183 Chronic kidney disease, stage 3 unspecified: Secondary | ICD-10-CM | POA: Diagnosis not present

## 2019-08-04 DIAGNOSIS — R1312 Dysphagia, oropharyngeal phase: Secondary | ICD-10-CM | POA: Diagnosis not present

## 2019-08-04 DIAGNOSIS — K219 Gastro-esophageal reflux disease without esophagitis: Secondary | ICD-10-CM | POA: Diagnosis not present

## 2019-08-04 DIAGNOSIS — I712 Thoracic aortic aneurysm, without rupture: Secondary | ICD-10-CM | POA: Diagnosis not present

## 2019-08-04 DIAGNOSIS — I5030 Unspecified diastolic (congestive) heart failure: Secondary | ICD-10-CM | POA: Diagnosis not present

## 2019-08-04 DIAGNOSIS — M25561 Pain in right knee: Secondary | ICD-10-CM | POA: Diagnosis not present

## 2019-08-04 DIAGNOSIS — R531 Weakness: Secondary | ICD-10-CM | POA: Diagnosis not present

## 2019-08-08 DIAGNOSIS — J40 Bronchitis, not specified as acute or chronic: Secondary | ICD-10-CM | POA: Diagnosis not present

## 2019-08-08 DIAGNOSIS — M25561 Pain in right knee: Secondary | ICD-10-CM | POA: Diagnosis not present

## 2019-08-08 DIAGNOSIS — K219 Gastro-esophageal reflux disease without esophagitis: Secondary | ICD-10-CM | POA: Diagnosis not present

## 2019-08-08 DIAGNOSIS — H3530 Unspecified macular degeneration: Secondary | ICD-10-CM | POA: Diagnosis not present

## 2019-08-08 DIAGNOSIS — I5030 Unspecified diastolic (congestive) heart failure: Secondary | ICD-10-CM | POA: Diagnosis not present

## 2019-08-08 DIAGNOSIS — I130 Hypertensive heart and chronic kidney disease with heart failure and stage 1 through stage 4 chronic kidney disease, or unspecified chronic kidney disease: Secondary | ICD-10-CM | POA: Diagnosis not present

## 2019-08-08 DIAGNOSIS — I712 Thoracic aortic aneurysm, without rupture: Secondary | ICD-10-CM | POA: Diagnosis not present

## 2019-08-08 DIAGNOSIS — R531 Weakness: Secondary | ICD-10-CM | POA: Diagnosis not present

## 2019-08-08 DIAGNOSIS — R1312 Dysphagia, oropharyngeal phase: Secondary | ICD-10-CM | POA: Diagnosis not present

## 2019-08-08 DIAGNOSIS — N183 Chronic kidney disease, stage 3 unspecified: Secondary | ICD-10-CM | POA: Diagnosis not present

## 2019-08-08 DIAGNOSIS — M1990 Unspecified osteoarthritis, unspecified site: Secondary | ICD-10-CM | POA: Diagnosis not present

## 2019-08-08 DIAGNOSIS — Z9181 History of falling: Secondary | ICD-10-CM | POA: Diagnosis not present

## 2019-08-08 DIAGNOSIS — I872 Venous insufficiency (chronic) (peripheral): Secondary | ICD-10-CM | POA: Diagnosis not present

## 2019-08-08 DIAGNOSIS — I700 Atherosclerosis of aorta: Secondary | ICD-10-CM | POA: Diagnosis not present

## 2019-08-09 ENCOUNTER — Ambulatory Visit: Payer: Medicare Other | Admitting: Podiatry

## 2019-08-09 DIAGNOSIS — F325 Major depressive disorder, single episode, in full remission: Secondary | ICD-10-CM

## 2019-08-09 DIAGNOSIS — I700 Atherosclerosis of aorta: Secondary | ICD-10-CM

## 2019-08-09 DIAGNOSIS — J40 Bronchitis, not specified as acute or chronic: Secondary | ICD-10-CM

## 2019-08-09 DIAGNOSIS — M1990 Unspecified osteoarthritis, unspecified site: Secondary | ICD-10-CM

## 2019-08-09 DIAGNOSIS — I712 Thoracic aortic aneurysm, without rupture: Secondary | ICD-10-CM

## 2019-08-09 DIAGNOSIS — H3530 Unspecified macular degeneration: Secondary | ICD-10-CM

## 2019-08-09 DIAGNOSIS — Z9181 History of falling: Secondary | ICD-10-CM

## 2019-08-09 DIAGNOSIS — M25561 Pain in right knee: Secondary | ICD-10-CM

## 2019-08-09 DIAGNOSIS — R1312 Dysphagia, oropharyngeal phase: Secondary | ICD-10-CM | POA: Diagnosis not present

## 2019-08-09 DIAGNOSIS — Z8701 Personal history of pneumonia (recurrent): Secondary | ICD-10-CM

## 2019-08-09 DIAGNOSIS — K219 Gastro-esophageal reflux disease without esophagitis: Secondary | ICD-10-CM | POA: Diagnosis not present

## 2019-08-09 DIAGNOSIS — I872 Venous insufficiency (chronic) (peripheral): Secondary | ICD-10-CM

## 2019-08-09 DIAGNOSIS — N183 Chronic kidney disease, stage 3 unspecified: Secondary | ICD-10-CM | POA: Diagnosis not present

## 2019-08-09 DIAGNOSIS — I130 Hypertensive heart and chronic kidney disease with heart failure and stage 1 through stage 4 chronic kidney disease, or unspecified chronic kidney disease: Secondary | ICD-10-CM | POA: Diagnosis not present

## 2019-08-09 DIAGNOSIS — I5030 Unspecified diastolic (congestive) heart failure: Secondary | ICD-10-CM | POA: Diagnosis not present

## 2019-08-09 DIAGNOSIS — R531 Weakness: Secondary | ICD-10-CM

## 2019-08-10 ENCOUNTER — Telehealth: Payer: Self-pay | Admitting: Family Medicine

## 2019-08-10 DIAGNOSIS — M1990 Unspecified osteoarthritis, unspecified site: Secondary | ICD-10-CM | POA: Diagnosis not present

## 2019-08-10 DIAGNOSIS — M25561 Pain in right knee: Secondary | ICD-10-CM | POA: Diagnosis not present

## 2019-08-10 DIAGNOSIS — J40 Bronchitis, not specified as acute or chronic: Secondary | ICD-10-CM | POA: Diagnosis not present

## 2019-08-10 DIAGNOSIS — H3530 Unspecified macular degeneration: Secondary | ICD-10-CM | POA: Diagnosis not present

## 2019-08-10 DIAGNOSIS — R1312 Dysphagia, oropharyngeal phase: Secondary | ICD-10-CM | POA: Diagnosis not present

## 2019-08-10 DIAGNOSIS — I5030 Unspecified diastolic (congestive) heart failure: Secondary | ICD-10-CM | POA: Diagnosis not present

## 2019-08-10 DIAGNOSIS — I130 Hypertensive heart and chronic kidney disease with heart failure and stage 1 through stage 4 chronic kidney disease, or unspecified chronic kidney disease: Secondary | ICD-10-CM | POA: Diagnosis not present

## 2019-08-10 DIAGNOSIS — I700 Atherosclerosis of aorta: Secondary | ICD-10-CM | POA: Diagnosis not present

## 2019-08-10 DIAGNOSIS — R531 Weakness: Secondary | ICD-10-CM | POA: Diagnosis not present

## 2019-08-10 DIAGNOSIS — K219 Gastro-esophageal reflux disease without esophagitis: Secondary | ICD-10-CM | POA: Diagnosis not present

## 2019-08-10 DIAGNOSIS — I712 Thoracic aortic aneurysm, without rupture: Secondary | ICD-10-CM | POA: Diagnosis not present

## 2019-08-10 DIAGNOSIS — N183 Chronic kidney disease, stage 3 unspecified: Secondary | ICD-10-CM | POA: Diagnosis not present

## 2019-08-10 DIAGNOSIS — Z9181 History of falling: Secondary | ICD-10-CM | POA: Diagnosis not present

## 2019-08-10 DIAGNOSIS — I872 Venous insufficiency (chronic) (peripheral): Secondary | ICD-10-CM | POA: Diagnosis not present

## 2019-08-10 NOTE — Telephone Encounter (Signed)
See note

## 2019-08-10 NOTE — Telephone Encounter (Signed)
Dierdre, with [employer] [employer], calling to inform Dr. Yong Channel that patient will be discharged from hh speech therapy next week with all goals met.

## 2019-08-11 DIAGNOSIS — R531 Weakness: Secondary | ICD-10-CM | POA: Diagnosis not present

## 2019-08-11 DIAGNOSIS — Z9181 History of falling: Secondary | ICD-10-CM | POA: Diagnosis not present

## 2019-08-11 DIAGNOSIS — M1990 Unspecified osteoarthritis, unspecified site: Secondary | ICD-10-CM | POA: Diagnosis not present

## 2019-08-11 DIAGNOSIS — I872 Venous insufficiency (chronic) (peripheral): Secondary | ICD-10-CM | POA: Diagnosis not present

## 2019-08-11 DIAGNOSIS — I712 Thoracic aortic aneurysm, without rupture: Secondary | ICD-10-CM | POA: Diagnosis not present

## 2019-08-11 DIAGNOSIS — R1312 Dysphagia, oropharyngeal phase: Secondary | ICD-10-CM | POA: Diagnosis not present

## 2019-08-11 DIAGNOSIS — I700 Atherosclerosis of aorta: Secondary | ICD-10-CM | POA: Diagnosis not present

## 2019-08-11 DIAGNOSIS — I130 Hypertensive heart and chronic kidney disease with heart failure and stage 1 through stage 4 chronic kidney disease, or unspecified chronic kidney disease: Secondary | ICD-10-CM | POA: Diagnosis not present

## 2019-08-11 DIAGNOSIS — J40 Bronchitis, not specified as acute or chronic: Secondary | ICD-10-CM | POA: Diagnosis not present

## 2019-08-11 DIAGNOSIS — N183 Chronic kidney disease, stage 3 unspecified: Secondary | ICD-10-CM | POA: Diagnosis not present

## 2019-08-11 DIAGNOSIS — I5030 Unspecified diastolic (congestive) heart failure: Secondary | ICD-10-CM | POA: Diagnosis not present

## 2019-08-11 DIAGNOSIS — H3530 Unspecified macular degeneration: Secondary | ICD-10-CM | POA: Diagnosis not present

## 2019-08-11 DIAGNOSIS — M25561 Pain in right knee: Secondary | ICD-10-CM | POA: Diagnosis not present

## 2019-08-11 DIAGNOSIS — K219 Gastro-esophageal reflux disease without esophagitis: Secondary | ICD-10-CM | POA: Diagnosis not present

## 2019-08-11 NOTE — Telephone Encounter (Signed)
Thanks for the update

## 2019-08-11 NOTE — Telephone Encounter (Signed)
FYI

## 2019-08-12 DIAGNOSIS — M25561 Pain in right knee: Secondary | ICD-10-CM | POA: Diagnosis not present

## 2019-08-12 DIAGNOSIS — I712 Thoracic aortic aneurysm, without rupture: Secondary | ICD-10-CM | POA: Diagnosis not present

## 2019-08-12 DIAGNOSIS — R531 Weakness: Secondary | ICD-10-CM | POA: Diagnosis not present

## 2019-08-12 DIAGNOSIS — N183 Chronic kidney disease, stage 3 unspecified: Secondary | ICD-10-CM | POA: Diagnosis not present

## 2019-08-12 DIAGNOSIS — H3530 Unspecified macular degeneration: Secondary | ICD-10-CM | POA: Diagnosis not present

## 2019-08-12 DIAGNOSIS — K219 Gastro-esophageal reflux disease without esophagitis: Secondary | ICD-10-CM | POA: Diagnosis not present

## 2019-08-12 DIAGNOSIS — J40 Bronchitis, not specified as acute or chronic: Secondary | ICD-10-CM | POA: Diagnosis not present

## 2019-08-12 DIAGNOSIS — I872 Venous insufficiency (chronic) (peripheral): Secondary | ICD-10-CM | POA: Diagnosis not present

## 2019-08-12 DIAGNOSIS — R1312 Dysphagia, oropharyngeal phase: Secondary | ICD-10-CM | POA: Diagnosis not present

## 2019-08-12 DIAGNOSIS — I700 Atherosclerosis of aorta: Secondary | ICD-10-CM | POA: Diagnosis not present

## 2019-08-12 DIAGNOSIS — I5030 Unspecified diastolic (congestive) heart failure: Secondary | ICD-10-CM | POA: Diagnosis not present

## 2019-08-12 DIAGNOSIS — M1990 Unspecified osteoarthritis, unspecified site: Secondary | ICD-10-CM | POA: Diagnosis not present

## 2019-08-12 DIAGNOSIS — Z9181 History of falling: Secondary | ICD-10-CM | POA: Diagnosis not present

## 2019-08-12 DIAGNOSIS — I130 Hypertensive heart and chronic kidney disease with heart failure and stage 1 through stage 4 chronic kidney disease, or unspecified chronic kidney disease: Secondary | ICD-10-CM | POA: Diagnosis not present

## 2019-08-16 DIAGNOSIS — I700 Atherosclerosis of aorta: Secondary | ICD-10-CM | POA: Diagnosis not present

## 2019-08-16 DIAGNOSIS — I872 Venous insufficiency (chronic) (peripheral): Secondary | ICD-10-CM | POA: Diagnosis not present

## 2019-08-16 DIAGNOSIS — I5030 Unspecified diastolic (congestive) heart failure: Secondary | ICD-10-CM | POA: Diagnosis not present

## 2019-08-16 DIAGNOSIS — M25561 Pain in right knee: Secondary | ICD-10-CM | POA: Diagnosis not present

## 2019-08-16 DIAGNOSIS — M1990 Unspecified osteoarthritis, unspecified site: Secondary | ICD-10-CM | POA: Diagnosis not present

## 2019-08-16 DIAGNOSIS — K219 Gastro-esophageal reflux disease without esophagitis: Secondary | ICD-10-CM | POA: Diagnosis not present

## 2019-08-16 DIAGNOSIS — N183 Chronic kidney disease, stage 3 unspecified: Secondary | ICD-10-CM | POA: Diagnosis not present

## 2019-08-16 DIAGNOSIS — J40 Bronchitis, not specified as acute or chronic: Secondary | ICD-10-CM | POA: Diagnosis not present

## 2019-08-16 DIAGNOSIS — H3530 Unspecified macular degeneration: Secondary | ICD-10-CM | POA: Diagnosis not present

## 2019-08-16 DIAGNOSIS — R531 Weakness: Secondary | ICD-10-CM | POA: Diagnosis not present

## 2019-08-16 DIAGNOSIS — I130 Hypertensive heart and chronic kidney disease with heart failure and stage 1 through stage 4 chronic kidney disease, or unspecified chronic kidney disease: Secondary | ICD-10-CM | POA: Diagnosis not present

## 2019-08-16 DIAGNOSIS — R1312 Dysphagia, oropharyngeal phase: Secondary | ICD-10-CM | POA: Diagnosis not present

## 2019-08-16 DIAGNOSIS — I712 Thoracic aortic aneurysm, without rupture: Secondary | ICD-10-CM | POA: Diagnosis not present

## 2019-08-16 DIAGNOSIS — Z9181 History of falling: Secondary | ICD-10-CM | POA: Diagnosis not present

## 2019-08-17 ENCOUNTER — Other Ambulatory Visit: Payer: Self-pay

## 2019-08-17 NOTE — Patient Outreach (Signed)
[name]) Care Management  08/17/2019  [name] November 06, 1919 [id number]   Social work referral received from [facility], on 08/16/19.  "Cannot find a Home Health agency that will take him ( insurance is an issue ) Per Dr Yong Channel - send to [name] to see if they can help." Contacted Physical Therapist with [facility] to seek clarification.  Per PT, patent is still receiving [facility] services and will likely discharge within the next 2-3 weeks.  Per PT, it may be that patient has been trying to obtain personal care services/in-home aide services.   Medicare only covers aide services while in conjunction with skilled service.  At this time, the only option for patient to receive long term aide services is private pay.   If patient were to qualify for Medicaid, he could be assessed for eligibility for pcs.  Attempted to contact patient today to discuss this, however, no answer or option to leave message.   PT with [facility] stated that she will call during next session which is scheduled for tomorrow between 12 and 1:00PM.     Ronn Melena, [place] Social Worker [phone]

## 2019-08-18 ENCOUNTER — Ambulatory Visit: Payer: Self-pay

## 2019-08-18 ENCOUNTER — Other Ambulatory Visit: Payer: Self-pay

## 2019-08-18 DIAGNOSIS — M1990 Unspecified osteoarthritis, unspecified site: Secondary | ICD-10-CM | POA: Diagnosis not present

## 2019-08-18 DIAGNOSIS — R531 Weakness: Secondary | ICD-10-CM | POA: Diagnosis not present

## 2019-08-18 DIAGNOSIS — K219 Gastro-esophageal reflux disease without esophagitis: Secondary | ICD-10-CM | POA: Diagnosis not present

## 2019-08-18 DIAGNOSIS — H3530 Unspecified macular degeneration: Secondary | ICD-10-CM | POA: Diagnosis not present

## 2019-08-18 DIAGNOSIS — I5030 Unspecified diastolic (congestive) heart failure: Secondary | ICD-10-CM | POA: Diagnosis not present

## 2019-08-18 DIAGNOSIS — Z9181 History of falling: Secondary | ICD-10-CM | POA: Diagnosis not present

## 2019-08-18 DIAGNOSIS — N183 Chronic kidney disease, stage 3 unspecified: Secondary | ICD-10-CM | POA: Diagnosis not present

## 2019-08-18 DIAGNOSIS — I712 Thoracic aortic aneurysm, without rupture: Secondary | ICD-10-CM | POA: Diagnosis not present

## 2019-08-18 DIAGNOSIS — I130 Hypertensive heart and chronic kidney disease with heart failure and stage 1 through stage 4 chronic kidney disease, or unspecified chronic kidney disease: Secondary | ICD-10-CM | POA: Diagnosis not present

## 2019-08-18 DIAGNOSIS — I700 Atherosclerosis of aorta: Secondary | ICD-10-CM | POA: Diagnosis not present

## 2019-08-18 DIAGNOSIS — I872 Venous insufficiency (chronic) (peripheral): Secondary | ICD-10-CM | POA: Diagnosis not present

## 2019-08-18 DIAGNOSIS — R1312 Dysphagia, oropharyngeal phase: Secondary | ICD-10-CM | POA: Diagnosis not present

## 2019-08-18 DIAGNOSIS — M25561 Pain in right knee: Secondary | ICD-10-CM | POA: Diagnosis not present

## 2019-08-18 DIAGNOSIS — J40 Bronchitis, not specified as acute or chronic: Secondary | ICD-10-CM | POA: Diagnosis not present

## 2019-08-18 NOTE — Patient Outreach (Signed)
[place] [facility]) Care Management  08/18/2019  Imre Guerrieri [facility] [birth year] [id number]   Incoming call from PT with [facility].  Plan was for her to call me while with patient for session today between 12 and 1:00.  She has not yet been to patient's home and it will be after [facility] office hours when she arrives for session.  Will attempt to connect with patient next week.  Ronn Melena, BSW Social Worker [phone]

## 2019-08-22 ENCOUNTER — Ambulatory Visit (INDEPENDENT_AMBULATORY_CARE_PROVIDER_SITE_OTHER): Payer: Medicare Other

## 2019-08-22 ENCOUNTER — Other Ambulatory Visit: Payer: Self-pay

## 2019-08-22 DIAGNOSIS — Z0000 Encounter for general adult medical examination without abnormal findings: Secondary | ICD-10-CM | POA: Diagnosis not present

## 2019-08-22 NOTE — Patient Instructions (Signed)
[name] , Thank you for taking time to come for your Medicare Wellness Visit. I appreciate your ongoing commitment to your health goals. Please review the following plan we discussed and let me know if I can assist you in the future.   Screening recommendations/referrals: Colorectal Screening: No longer indicated   Vision and Dental Exams: Recommended annual ophthalmology exams for early detection of glaucoma and other disorders of the eye Recommended annual dental exams for proper oral hygiene  Vaccinations: Influenza vaccine: completed 05/09/19 Pneumococcal vaccine: up to date; last 11/28/14 Tdap vaccine: up to date; last 03/15/18  Shingles vaccine: Please call your insurance company to determine your out of pocket expense for the Shingrix vaccine. You may receive this vaccine at your local pharmacy.  Advanced directives: Please bring a copy of your POA (Power of Attorney) and/or Living Will to your next appointment.  Goals: Recommend to drink at least 6-8 8oz glasses of water per day and consume a balanced diet rich in fresh fruits and vegetables.   Next appointment: Please schedule your Annual Wellness Visit with your Nurse Health Advisor in one year.  Preventive Care 83 Years and Older, Male Preventive care refers to lifestyle choices and visits with your health care provider that can promote health and wellness. What does preventive care include?  A yearly physical exam. This is also called an annual well check.  Dental exams once or twice a year.  Routine eye exams. Ask your health care provider how often you should have your eyes checked.  Personal lifestyle choices, including:  Daily care of your teeth and gums.  Regular physical activity.  Eating a healthy diet.  Avoiding tobacco and drug use.  Limiting alcohol use.  Practicing safe sex.  Taking low doses of aspirin every day if recommended by your health care provider..  Taking vitamin and mineral supplements  as recommended by your health care provider. What happens during an annual well check? The services and screenings done by your health care provider during your annual well check will depend on your age, overall health, lifestyle risk factors, and family history of disease. Counseling  Your health care provider may ask you questions about your:  Alcohol use.  Tobacco use.  Drug use.  Emotional well-being.  Home and relationship well-being.  Sexual activity.  Eating habits.  History of falls.  Memory and ability to understand (cognition).  Work and work Statistician. Screening  You may have the following tests or measurements:  Height, weight, and BMI.  Blood pressure.  Lipid and cholesterol levels. These may be checked every 5 years, or more frequently if you are over 1 years old.  Skin check.  Lung cancer screening. You may have this screening every year starting at age 83 if you have a 30-pack-year history of smoking and currently smoke or have quit within the past 15 years.  Fecal occult blood test (FOBT) of the stool. You may have this test every year starting at age 83.  Flexible sigmoidoscopy or colonoscopy. You may have a sigmoidoscopy every 5 years or a colonoscopy every 10 years starting at age 83.  Prostate cancer screening. Recommendations will vary depending on your family history and other risks.  Hepatitis C blood test.  Hepatitis B blood test.  Sexually transmitted disease (STD) testing.  Diabetes screening. This is done by checking your blood sugar (glucose) after you have not eaten for a while (fasting). You may have this done every 1-3 years.  Abdominal aortic aneurysm (AAA)  screening. You may need this if you are a current or former smoker.  Osteoporosis. You may be screened starting at age 83 if you are at high risk. Talk with your health care provider about your test results, treatment options, and if necessary, the need for more  tests. Vaccines  Your health care provider may recommend certain vaccines, such as:  Influenza vaccine. This is recommended every year.  Tetanus, diphtheria, and acellular pertussis (Tdap, Td) vaccine. You may need a Td booster every 10 years.  Zoster vaccine. You may need this after age 83.  Pneumococcal 13-valent conjugate (PCV13) vaccine. One dose is recommended after age 83.  Pneumococcal polysaccharide (PPSV23) vaccine. One dose is recommended after age 83. Talk to your health care provider about which screenings and vaccines you need and how often you need them. This information is not intended to replace advice given to you by your health care provider. Make sure you discuss any questions you have with your health care provider. Document Released: 09/07/2015 Document Revised: 04/30/2016 Document Reviewed: 06/12/2015 Elsevier Interactive Patient Education  2017 [place] Prevention in the Home Falls can cause injuries. They can happen to people of all ages. There are many things you can do to make your home safe and to help prevent falls. What can I do on the outside of my home?  Regularly fix the edges of walkways and driveways and fix any cracks.  Remove anything that might make you trip as you walk through a door, such as a raised step or threshold.  Trim any bushes or trees on the path to your home.  Use bright outdoor lighting.  Clear any walking paths of anything that might make someone trip, such as rocks or tools.  Regularly check to see if handrails are loose or broken. Make sure that both sides of any steps have handrails.  Any raised decks and porches should have guardrails on the edges.  Have any leaves, snow, or ice cleared regularly.  Use sand or salt on walking paths during winter.  Clean up any spills in your garage right away. This includes oil or grease spills. What can I do in the bathroom?  Use night lights.  Install grab bars by the  toilet and in the tub and shower. Do not use towel bars as grab bars.  Use non-skid mats or decals in the tub or shower.  If you need to sit down in the shower, use a plastic, non-slip stool.  Keep the floor dry. Clean up any water that spills on the floor as soon as it happens.  Remove soap buildup in the tub or shower regularly.  Attach bath mats securely with double-sided non-slip rug tape.  Do not have throw rugs and other things on the floor that can make you trip. What can I do in the bedroom?  Use night lights.  Make sure that you have a light by your bed that is easy to reach.  Do not use any sheets or blankets that are too big for your bed. They should not hang down onto the floor.  Have a firm chair that has side arms. You can use this for support while you get dressed.  Do not have throw rugs and other things on the floor that can make you trip. What can I do in the kitchen?  Clean up any spills right away.  Avoid walking on wet floors.  Keep items that you use a lot in easy-to-reach  places.  If you need to reach something above you, use a strong step stool that has a grab bar.  Keep electrical cords out of the way.  Do not use floor polish or wax that makes floors slippery. If you must use wax, use non-skid floor wax.  Do not have throw rugs and other things on the floor that can make you trip. What can I do with my stairs?  Do not leave any items on the stairs.  Make sure that there are handrails on both sides of the stairs and use them. Fix handrails that are broken or loose. Make sure that handrails are as long as the stairways.  Check any carpeting to make sure that it is firmly attached to the stairs. Fix any carpet that is loose or worn.  Avoid having throw rugs at the top or bottom of the stairs. If you do have throw rugs, attach them to the floor with carpet tape.  Make sure that you have a light switch at the top of the stairs and the bottom of  the stairs. If you do not have them, ask someone to add them for you. What else can I do to help prevent falls?  Wear shoes that:  Do not have high heels.  Have rubber bottoms.  Are comfortable and fit you well.  Are closed at the toe. Do not wear sandals.  If you use a stepladder:  Make sure that it is fully opened. Do not climb a closed stepladder.  Make sure that both sides of the stepladder are locked into place.  Ask someone to hold it for you, if possible.  Clearly mark and make sure that you can see:  Any grab bars or handrails.  First and last steps.  Where the edge of each step is.  Use tools that help you move around (mobility aids) if they are needed. These include:  Canes.  Walkers.  Scooters.  Crutches.  Turn on the lights when you go into a dark area. Replace any light bulbs as soon as they burn out.  Set up your furniture so you have a clear path. Avoid moving your furniture around.  If any of your floors are uneven, fix them.  If there are any pets around you, be aware of where they are.  Review your medicines with your doctor. Some medicines can make you feel dizzy. This can increase your chance of falling. Ask your doctor what other things that you can do to help prevent falls. This information is not intended to replace advice given to you by your health care provider. Make sure you discuss any questions you have with your health care provider. Document Released: 06/07/2009 Document Revised: 01/17/2016 Document Reviewed: 09/15/2014 Elsevier Interactive Patient Education  2017 [employer].

## 2019-08-22 NOTE — Progress Notes (Signed)
This visit is being conducted via phone call due to the COVID-19 pandemic. This patient has given me verbal consent via phone to conduct this visit, patient states they are participating from their home address. Some vital signs may be absent or patient reported.   Patient identification: identified by name, DOB, and current address.  Location provider: [facility], Office Persons participating in the virtual visit: Denman George LPN, patient, spouse Wendee Copp [facility]), and Dr. Dimas Chyle   Subjective:   Roger [name] is a 85 y.o. male who presents for Medicare Annual/Subsequent preventive examination.  Review of Systems:   Cardiac Risk Factors include: advanced age (>34men, >22 women);male gender;hypertension    Objective:    Vitals: There were no vitals taken for this visit.  There is no height or weight on file to calculate BMI.  Advanced Directives 08/22/2019 10/14/2017 08/29/2017 08/29/2017 03/08/2015 11/17/2013  Does Patient Have a Medical Advance Directive? Yes No Yes Yes Yes Patient has advance directive, copy not in chart  Type of Advance Directive [place];Living will [place];Living will  Does patient want to make changes to medical advance directive? No - Patient declined - No - Patient declined - - No change requested  Copy of [place] in Chart? No - copy requested - - - No - copy requested Copy requested from other (Comment)  Would patient like information on creating a medical advance directive? - No - Patient declined - - - -  Pre-existing out of facility DNR order (yellow form or pink MOST form) - - - - - No    Tobacco Social History   Tobacco Use  Smoking Status Former Smoker  . Packs/day: 1.00  . Years: 20.00  . Pack years: 20.00  . Types: Cigars, Cigarettes, Pipe  . Quit date: 08/25/2014  . Years since quitting: 4.9  Smokeless Tobacco Never Used       Counseling given: Not Answered   Clinical Intake:  Pre-visit preparation completed: Yes  Pain : No/denies pain  Diabetes: No  How often do you need to have someone help you when you read instructions, pamphlets, or other written materials from your doctor or pharmacy?: 1 - Never  Interpreter Needed?: No  Information entered by :: Denman George LPN  Past Medical History:  Diagnosis Date  . GERD 02/22/2007  . HYPERLIPIDEMIA 02/22/2007  . HYPERTENSION 06/17/2007  . MACULAR DEGENERATION 02/22/2007  . OSTEOARTHRITIS, HIP, RIGHT 06/05/2009   Past Surgical History:  Procedure Laterality Date  . LAMINECTOMY  1988   Family History  Problem Relation Age of Onset  . Breast cancer Daughter    Social History   Socioeconomic History  . Marital status: Married    Spouse name: Not on file  . Number of children: Not on file  . Years of education: Not on file  . Highest education level: Not on file  Occupational History  . Not on file  Tobacco Use  . Smoking status: Former Smoker    Packs/day: 1.00    Years: 20.00    Pack years: 20.00    Types: Cigars, Cigarettes, Pipe    Quit date: 08/25/2014    Years since quitting: 4.9  . Smokeless tobacco: Never Used  Substance and Sexual Activity  . Alcohol use: No  . Drug use: No  . Sexual activity: Never  Other Topics Concern  . Not on file  Social History Narrative  Married (wife Wendee Copp in [place] practice) 1947. 2 [place] oldest daughter to breast cancer. 4 grandkids.       Patient and wife live with daughter. In basement-with lift.        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    Full Code.       Social Determinants of Health   Financial Resource Strain:   . Difficulty of Paying Living Expenses: Not on file  Food Insecurity:   . Worried About Charity fundraiser in the Last Year: Not on file  . Ran Out of Food in the Last Year: Not on file  Transportation Needs:   . Lack of Transportation  (Medical): Not on file  . Lack of Transportation (Non-Medical): Not on file  Physical Activity:   . Days of Exercise per Week: Not on file  . Minutes of Exercise per Session: Not on file  Stress:   . Feeling of Stress : Not on file  Social Connections:   . Frequency of Communication with Friends and Family: Not on file  . Frequency of Social Gatherings with Friends and Family: Not on file  . Attends Religious Services: Not on file  . Active Member of Clubs or Organizations: Not on file  . Attends Archivist Meetings: Not on file  . Marital Status: Not on file    Outpatient Encounter Medications as of 08/22/2019  Medication Sig  . furosemide (LASIX) 20 MG tablet Take 1 tablet (20 mg total) by mouth daily.  [name] lovastatin (MEVACOR) 40 MG tablet Take 1 tablet (40 mg total) by mouth daily.  . Multiple Vitamins-Minerals (MULTIVITAMIN WITH MINERALS) tablet Take 1 tablet by mouth daily.  [name] omeprazole (PRILOSEC) 20 MG capsule TAKE ONE CAPSULE BY MOUTH TWICE A DAY BEFORE A MEAL  . UNABLE TO FIND Med Name: Med pass 120 mL by mouth twice daily for supplement  . benzonatate (TESSALON) 100 MG capsule Take 1 capsule (100 mg total) by mouth 2 (two) times daily as needed for cough. (Patient not taking: Reported on 08/22/2019)  . sertraline (ZOLOFT) 50 MG tablet 1 and 1/2 tab daily (Patient not taking: Reported on 08/22/2019)  . [DISCONTINUED] diltiazem (CARDIZEM CD) 180 MG 24 hr capsule Take 180 mg by mouth daily.   . [DISCONTINUED] hydrochlorothiazide (MICROZIDE) 12.5 MG capsule Take 1 capsule (12.5 mg total) by mouth daily.   No facility-administered encounter medications on file as of 08/22/2019.    Activities of Daily Living In your present state of health, do you have any difficulty performing the following activities: 08/22/2019  Hearing? N  Vision? N  Difficulty concentrating or making decisions? N  Walking or climbing stairs? Y  Dressing or bathing? N  Doing errands, shopping? Y   Preparing Food and eating ? N  Using the Toilet? N  In the past six months, have you accidently leaked urine? N  Do you have problems with loss of bowel control? N  Managing your Medications? N  Managing your Finances? N  Housekeeping or managing your Housekeeping? Y  Some recent data might be hidden    Patient Care Team: [name], MD as PCP - General (Family Medicine) Deloria Lair, NP as [place], Museum/gallery conservator as [employer]ographer, Bonna Gains, DPM as Consulting Physician (Podiatry) Tanda Rockers, MD as Consulting Physician (Pulmonary Disease)   Assessment:   This is a routine wellness examination for [facility].  Exercise Activities  and Dietary recommendations Current Exercise Habits: The patient does not participate in regular exercise at present  Goals   None     Fall Risk Fall Risk  08/22/2019 06/22/2019 05/09/2019 03/15/2018 01/26/2017  Falls in the past year? 0 0 0 Yes No  Number falls in past yr: - 0 0 1 -  Injury with Fall? 0 0 0 Yes -  Risk Factor Category  - - - High Fall Risk -  Risk for fall due to : Impaired mobility - Impaired mobility - -  Risk for fall due to: Comment - - - - -  Follow up Education provided;Falls prevention discussed;Falls evaluation completed - - - -   Is the patient's home free of loose throw rugs in walkways, pet beds, electrical cords, etc?   yes      Grab bars in the bathroom? yes      Handrails on the stairs?   yes      Adequate lighting?   yes  Currently receiving in home PT for gait training   Depression Screen PHQ 2/9 Scores 08/22/2019 06/22/2019 05/09/2019 09/24/2018  PHQ - 2 Score 0 0 1 3  PHQ- 9 Score 2 1 - 6    Cognitive Function- no cognitive concerns at this time  Cognitive Testing  Alert? Yes         Normal Appearance? N/a  Oriented to person? Yes           Place? Yes  Time? Yes  Recall of three objects? Yes  Can perform simple calculations?  Yes  Displays appropriate judgment? Yes  Can read the correct time from a watch face? Yes   Immunization History  Administered Date(s) Administered  . Fluad Quad(high Dose 65+) 05/09/2019  . Influenza Split 05/24/2012  . Influenza Whole 06/17/2007, 05/26/2008, 06/05/2009, 05/06/2010  . Influenza, High Dose Seasonal PF 06/10/2013, 07/13/2014, 05/23/2016, 05/25/2017, 05/17/2018  . Influenza,inj,Quad PF,6+ Mos 07/05/2015  . PPD Test 03/12/2015  . Pneumococcal Conjugate-13 11/28/2014  . Pneumococcal Polysaccharide-23 08/25/1994, 06/17/2007  . Td 08/25/1992, 02/05/2009, 03/15/2018  . Zoster 12/13/2013    Qualifies for Shingles Vaccine? Discussed and patient will check with pharmacy for coverage.  Patient education handout provided   Screening Tests Health Maintenance  Topic Date Due  . TETANUS/TDAP  03/15/2028  . INFLUENZA VACCINE  Completed  . PNA vac Low Risk Adult  Completed   Cancer Screenings: Lung: Low Dose CT Chest recommended if Age 50-80 years, 30 pack-year currently smoking OR have quit w/in 15years. Patient does not qualify. Colorectal: No longer indicated    Plan:  I have personally reviewed and addressed the Medicare Annual Wellness questionnaire and have noted the following in the patient's chart:  A. Medical and social history B. Use of alcohol, tobacco or illicit drugs  C. Current medications and supplements D. Functional ability and status E.  Nutritional status F.  Physical activity G. Advance directives H. List of other physicians I.  Hospitalizations, surgeries, and ER visits in previous 12 months J.  [place] such as hearing and vision if needed, cognitive and depression L. Referrals, records requested, and appointments- none   In addition, I have reviewed and discussed with patient certain preventive protocols, quality metrics, and best practice recommendations. A written personalized care plan for preventive services as well as general  preventive health recommendations were provided to patient.   Signed,  Denman George, LPN  Nurse Health Advisor   Nurse Notes: Patient has upcoming appointment on 09/01/19 with  Dr. Yong Channel.  He would like to discuss urinary problems.

## 2019-08-23 ENCOUNTER — Other Ambulatory Visit: Payer: Self-pay

## 2019-08-23 ENCOUNTER — Ambulatory Visit: Payer: Self-pay

## 2019-08-23 DIAGNOSIS — I712 Thoracic aortic aneurysm, without rupture: Secondary | ICD-10-CM | POA: Diagnosis not present

## 2019-08-23 DIAGNOSIS — H3530 Unspecified macular degeneration: Secondary | ICD-10-CM | POA: Diagnosis not present

## 2019-08-23 DIAGNOSIS — K219 Gastro-esophageal reflux disease without esophagitis: Secondary | ICD-10-CM | POA: Diagnosis not present

## 2019-08-23 DIAGNOSIS — I5030 Unspecified diastolic (congestive) heart failure: Secondary | ICD-10-CM | POA: Diagnosis not present

## 2019-08-23 DIAGNOSIS — R1312 Dysphagia, oropharyngeal phase: Secondary | ICD-10-CM | POA: Diagnosis not present

## 2019-08-23 DIAGNOSIS — M1990 Unspecified osteoarthritis, unspecified site: Secondary | ICD-10-CM | POA: Diagnosis not present

## 2019-08-23 DIAGNOSIS — Z9181 History of falling: Secondary | ICD-10-CM | POA: Diagnosis not present

## 2019-08-23 DIAGNOSIS — M25561 Pain in right knee: Secondary | ICD-10-CM | POA: Diagnosis not present

## 2019-08-23 DIAGNOSIS — I130 Hypertensive heart and chronic kidney disease with heart failure and stage 1 through stage 4 chronic kidney disease, or unspecified chronic kidney disease: Secondary | ICD-10-CM | POA: Diagnosis not present

## 2019-08-23 DIAGNOSIS — N183 Chronic kidney disease, stage 3 unspecified: Secondary | ICD-10-CM | POA: Diagnosis not present

## 2019-08-23 DIAGNOSIS — I872 Venous insufficiency (chronic) (peripheral): Secondary | ICD-10-CM | POA: Diagnosis not present

## 2019-08-23 DIAGNOSIS — R531 Weakness: Secondary | ICD-10-CM | POA: Diagnosis not present

## 2019-08-23 DIAGNOSIS — J40 Bronchitis, not specified as acute or chronic: Secondary | ICD-10-CM | POA: Diagnosis not present

## 2019-08-23 DIAGNOSIS — I700 Atherosclerosis of aorta: Secondary | ICD-10-CM | POA: Diagnosis not present

## 2019-08-23 NOTE — Patient Outreach (Signed)
[place] [facility]) Care Management  08/23/2019  Nakul Tuffy [facility] 1920/01/06 [id number]   Social work referral received from [place], on 08/16/19.  "Cannot find a [place] agency that will take him ( insurance is an issue ) Per Dr Yong Channel - send to [facility] to see if they can help." Contacted Physical Therapist with [facility] on 08/17/19 to seek clarification.  Per PT, patent is still receiving [place] services and will likely discharge within the next 2-3 weeks.  Per PT, it may be that patient has been trying to obtain personal care services/in-home aide services.   Medicare only covers aide services while in conjunction with skilled service.  At this time, the only option for patient to receive long term aide services is private pay.   If patient were to qualify for Medicaid, he could be assessed for eligibility for pcs.   Second unsuccessful attempt to contact patient today to discuss this, however, no answer or option to leave message.   Will attempt to reach again within four business days.  Ronn Melena, BSW Social Worker [phone]

## 2019-08-25 DIAGNOSIS — H3530 Unspecified macular degeneration: Secondary | ICD-10-CM | POA: Diagnosis not present

## 2019-08-25 DIAGNOSIS — N183 Chronic kidney disease, stage 3 unspecified: Secondary | ICD-10-CM | POA: Diagnosis not present

## 2019-08-25 DIAGNOSIS — I130 Hypertensive heart and chronic kidney disease with heart failure and stage 1 through stage 4 chronic kidney disease, or unspecified chronic kidney disease: Secondary | ICD-10-CM | POA: Diagnosis not present

## 2019-08-25 DIAGNOSIS — I712 Thoracic aortic aneurysm, without rupture: Secondary | ICD-10-CM | POA: Diagnosis not present

## 2019-08-25 DIAGNOSIS — I872 Venous insufficiency (chronic) (peripheral): Secondary | ICD-10-CM | POA: Diagnosis not present

## 2019-08-25 DIAGNOSIS — I5030 Unspecified diastolic (congestive) heart failure: Secondary | ICD-10-CM | POA: Diagnosis not present

## 2019-08-25 DIAGNOSIS — R531 Weakness: Secondary | ICD-10-CM | POA: Diagnosis not present

## 2019-08-25 DIAGNOSIS — J40 Bronchitis, not specified as acute or chronic: Secondary | ICD-10-CM | POA: Diagnosis not present

## 2019-08-25 DIAGNOSIS — K219 Gastro-esophageal reflux disease without esophagitis: Secondary | ICD-10-CM | POA: Diagnosis not present

## 2019-08-25 DIAGNOSIS — M25561 Pain in right knee: Secondary | ICD-10-CM | POA: Diagnosis not present

## 2019-08-25 DIAGNOSIS — M1990 Unspecified osteoarthritis, unspecified site: Secondary | ICD-10-CM | POA: Diagnosis not present

## 2019-08-25 DIAGNOSIS — I700 Atherosclerosis of aorta: Secondary | ICD-10-CM | POA: Diagnosis not present

## 2019-08-25 DIAGNOSIS — Z9181 History of falling: Secondary | ICD-10-CM | POA: Diagnosis not present

## 2019-08-25 DIAGNOSIS — R1312 Dysphagia, oropharyngeal phase: Secondary | ICD-10-CM | POA: Diagnosis not present

## 2019-08-29 ENCOUNTER — Other Ambulatory Visit: Payer: Self-pay

## 2019-08-29 ENCOUNTER — Ambulatory Visit: Payer: Medicare Other | Admitting: Podiatry

## 2019-08-29 ENCOUNTER — Encounter: Payer: Self-pay | Admitting: Podiatry

## 2019-08-29 VITALS — Temp 98.2°F

## 2019-08-29 DIAGNOSIS — M79674 Pain in right toe(s): Secondary | ICD-10-CM | POA: Diagnosis not present

## 2019-08-29 DIAGNOSIS — B351 Tinea unguium: Secondary | ICD-10-CM

## 2019-08-29 DIAGNOSIS — M79675 Pain in left toe(s): Secondary | ICD-10-CM

## 2019-08-30 ENCOUNTER — Ambulatory Visit: Payer: Self-pay

## 2019-08-30 ENCOUNTER — Other Ambulatory Visit: Payer: Self-pay

## 2019-08-30 NOTE — Patient Outreach (Signed)
[place] [facility]) Care Management  08/30/2019  Reegan Kois [facility] 1920-05-14 [id number]   Social work referral received from [place], [facility], on 08/16/19. "Cannot find a [place] agency that will take him ( insurance is an issue ) Per Dr Yong Channel - send to [facility] to see if they can help." Contacted Physical Therapist with [facility] on 08/17/19 to seek clarification. Per PT, patent is still receiving [place] services and will likely discharge within the next 2-3 weeks. Per PT, it may be that patient has been trying to obtain personal care services/in-home aide services.  Medicare only covers aide services while in conjunction with skilled service. At this time, the only option for patient to receive long term aide services is private pay.  If patient were to qualify for Medicaid, he could be assessed for eligibility for pcs.  Third attempt to contact patient today to discuss this, however, no answer or option to leave message.  Mailed unsuccessful outreach letter.  Will close case if no response to letter by 09/13/19.    Ronn Melena, BSW Social Worker [phone]

## 2019-08-31 DIAGNOSIS — I872 Venous insufficiency (chronic) (peripheral): Secondary | ICD-10-CM | POA: Diagnosis not present

## 2019-08-31 DIAGNOSIS — R531 Weakness: Secondary | ICD-10-CM | POA: Diagnosis not present

## 2019-08-31 DIAGNOSIS — I130 Hypertensive heart and chronic kidney disease with heart failure and stage 1 through stage 4 chronic kidney disease, or unspecified chronic kidney disease: Secondary | ICD-10-CM | POA: Diagnosis not present

## 2019-08-31 DIAGNOSIS — R1312 Dysphagia, oropharyngeal phase: Secondary | ICD-10-CM | POA: Diagnosis not present

## 2019-08-31 DIAGNOSIS — I700 Atherosclerosis of aorta: Secondary | ICD-10-CM | POA: Diagnosis not present

## 2019-08-31 DIAGNOSIS — M25561 Pain in right knee: Secondary | ICD-10-CM | POA: Diagnosis not present

## 2019-08-31 DIAGNOSIS — I712 Thoracic aortic aneurysm, without rupture: Secondary | ICD-10-CM | POA: Diagnosis not present

## 2019-08-31 DIAGNOSIS — I5030 Unspecified diastolic (congestive) heart failure: Secondary | ICD-10-CM | POA: Diagnosis not present

## 2019-08-31 DIAGNOSIS — K219 Gastro-esophageal reflux disease without esophagitis: Secondary | ICD-10-CM | POA: Diagnosis not present

## 2019-08-31 DIAGNOSIS — N183 Chronic kidney disease, stage 3 unspecified: Secondary | ICD-10-CM | POA: Diagnosis not present

## 2019-08-31 DIAGNOSIS — J40 Bronchitis, not specified as acute or chronic: Secondary | ICD-10-CM | POA: Diagnosis not present

## 2019-08-31 DIAGNOSIS — M1990 Unspecified osteoarthritis, unspecified site: Secondary | ICD-10-CM | POA: Diagnosis not present

## 2019-08-31 DIAGNOSIS — H3530 Unspecified macular degeneration: Secondary | ICD-10-CM | POA: Diagnosis not present

## 2019-08-31 DIAGNOSIS — Z9181 History of falling: Secondary | ICD-10-CM | POA: Diagnosis not present

## 2019-09-01 ENCOUNTER — Encounter: Payer: Self-pay | Admitting: Family Medicine

## 2019-09-01 ENCOUNTER — Ambulatory Visit (INDEPENDENT_AMBULATORY_CARE_PROVIDER_SITE_OTHER): Payer: Medicare Other | Admitting: Family Medicine

## 2019-09-01 VITALS — BP 130/86 | HR 71 | Temp 97.5°F | Ht 68.5 in | Wt 142.8 lb

## 2019-09-01 DIAGNOSIS — N3281 Overactive bladder: Secondary | ICD-10-CM

## 2019-09-01 DIAGNOSIS — R053 Chronic cough: Secondary | ICD-10-CM

## 2019-09-01 DIAGNOSIS — R05 Cough: Secondary | ICD-10-CM | POA: Diagnosis not present

## 2019-09-01 DIAGNOSIS — F325 Major depressive disorder, single episode, in full remission: Secondary | ICD-10-CM | POA: Diagnosis not present

## 2019-09-01 NOTE — Progress Notes (Signed)
Phone [phone] In person visit   Subjective:   Roger [name] is a 86 y.o. year old very pleasant male patient who presents for/with See problem oriented charting Chief Complaint  Patient presents with  . Follow-up    medication chagnes     ROS- Review of Systems  Constitutional: Negative.   HENT: Positive for hearing loss.        Has hearing aid   Eyes: Negative.        Has appointment with eye dr soon   Respiratory: Negative.   Cardiovascular: Negative.   Gastrointestinal: Negative.   Genitourinary: Positive for frequency.       Has history of this in the past.   Musculoskeletal: Negative.   Skin: Negative.   Neurological: Negative.   Endo/Heme/Allergies: Negative.   Psychiatric/Behavioral: Negative.    This visit occurred during the SARS-CoV-2 public health emergency.  Safety protocols were in place, including screening questions prior to the visit, additional usage of staff PPE, and extensive cleaning of exam room while observing appropriate contact time as indicated for disinfecting solutions.   Past Medical History-  Patient Active Problem List   Diagnosis Date Noted  . Ascending aortic aneurysm ([place]) 10/17/2017    Priority: High  . Diastolic congestive heart failure ([place]) 02/05/2016    Priority: High  . Overactive bladder 07/13/2014    Priority: High  . Allergic conjunctivitis of both eyes 06/22/2017    Priority: Medium  . Depression, major, single episode, complete remission ([place]) 07/05/2015    Priority: Medium  . History of pneumonia 03/10/2015    Priority: Medium  . Chronic cough 10/11/2014    Priority: Medium  . CKD (chronic kidney disease), stage III ([place]) 07/13/2014    Priority: Medium  . Syncope 11/17/2013    Priority: Medium  . Hypertension 06/17/2007    Priority: Medium  . Hyperlipidemia 02/22/2007    Priority: Medium  . Aortic atherosclerosis ([place]) 10/17/2017    Priority: Low  . Bronchitis 08/29/2017    Priority: Low  . Venous  (peripheral) insufficiency 01/26/2015    Priority: Low  . Macular degeneration 06/19/2011    Priority: Low  . Osteoarthritis 06/05/2009    Priority: Low  . CARCINOMA, SKIN, SQUAMOUS CELL 05/26/2008    Priority: Low  . GERD (gastroesophageal reflux disease) 02/22/2007    Priority: Low  . Right hip pain 09/13/2018  . Laceration of scalp 10/17/2017  . Onychomycosis 10/22/2016    Medications- reviewed and updated Current Outpatient Medications  Medication Sig Dispense Refill  . benzonatate (TESSALON) 100 MG capsule Take 1 capsule (100 mg total) by mouth 2 (two) times daily as needed for cough. 30 capsule 0  . furosemide (LASIX) 20 MG tablet Take 1 tablet (20 mg total) by mouth daily. 90 tablet 1  . lovastatin (MEVACOR) 40 MG tablet Take 1 tablet (40 mg total) by mouth daily. 90 tablet 1  . Multiple Vitamins-Minerals (MULTIVITAMIN WITH MINERALS) tablet Take 1 tablet by mouth daily.    [name] omeprazole (PRILOSEC) 20 MG capsule TAKE ONE CAPSULE BY MOUTH TWICE A DAY BEFORE A MEAL 180 capsule 0  . sertraline (ZOLOFT) 50 MG tablet 1 and 1/2 tab daily 135 tablet 3  . UNABLE TO FIND Med Name: Med pass 120 mL by mouth twice daily for supplement     No current facility-administered medications for this visit.     Objective:  BP 130/86 (BP Location: Left Arm)   Pulse 71   Temp (!) 97.5 F (36.4  C)   Ht 5' 8.5" (1.74 m)   Wt 142 lb 12.8 oz (64.8 kg)   SpO2 92%   BMI 21.40 kg/m  Gen: NAD, resting comfortably Tympanic membrane's normal after cerumen was removed CV: RRR no murmurs rubs or gallops Lungs: CTAB no crackles, wheeze, rhonchi Abdomen: soft/nontender/nondistended/normal bowel sounds.  Ext: 1+ edema Skin: warm, dry, under left eye there is a 3 x 3 mm dark/firm/mobile lesion that appears to be just subcutaneous     Assessment and Plan   # ear pain  S right ear for two weeks has felt congestion. Thinks that he has wax in it.  Has required multiple irrigations in the past A/P: We  were able to curette out significant cerumen from bilateral ear canals-normal tympanic membranes noted afterwards.  Still hard of hearing per baseline   # cough  S: Patient complains of continued issues with chronic cough/issues with clearing phlegm. Patient states was having trouble with eating at times and getting a cough afterwards. He did do speech therapy. It did help a little with eating.    He is also seen ENT and pulmonary in the past A/P: Thrilled speech therapy was helpful for postprandial cough-encouraged him to continue the changes they recommended  # spot under eye  S:patient has spot under left eye. Patient states it has been seen by dermatology in the past but did not want to have removed. Size has not changed but the color has slightly.   A/P: Subcutaneous lesion of unknown etiology.  We discussed referral back to dermatology for consideration of removal-he declines for now.  I think this is reasonable given long-term stability of lesion but he agrees if it worsens further to seek care  # Depression S: PHQ-9 is not elevated today with score under 5.  Increasing Zoloft to 75 mg has been helpful.  Irritability reportedly is better at home A/P: Full remission today-continue Zoloft 25 mg  Right knee pain/instability- working with PT.  He asks me about using a knee brace-I think a neoprene sleeve would be reasonable when active during the daytime  BPH- difficulty with stream/ some frequency. Oxybutynin helped in past.  Patient would like something to help him with urinary frequency and urgency-I do not think there is a great option for him-oxybutynin not ideal at his age.  Flomax may increase fall risk.  After our discussion he is going to continue to monitor -After visit I thought about finasteride and that might be reasonable to try in the future if needed  Recommended follow up: Return in about 6 months (around 02/29/2020) for follow up- or sooner if needed.  He may want to do  46-month follow-up Future Appointments  Date Time Provider [place]  11/07/2019  1:45 PM Trula Slade, DPM [facility]  12/01/2019 11:20 AM Yong Channel, Brayton Mars, MD [facility]    Lab/Order associations:   ICD-10-CM   1. Overactive bladder  N32.81   2. Depression, major, single episode, complete remission ([place])  F32.5   3. Chronic cough  R05    Time Spent: 34 minutes of total time (1:58 PM- 2:32 PM) was spent on the date of the encounter performing the following actions: chart review prior to seeing the patient, obtaining history, performing a medically necessary exam, counseling on the treatment plan, placing orders, and documenting in our EHR.  Please note patient is hard of hearing and this takes additional time to communicate well with him  Return precautions advised.  Annie Main  Yong Channel, MD

## 2019-09-01 NOTE — Patient Instructions (Addendum)
Right and left ear look better affter cureting today  Glad depression is better on zoloft 75 mg  Lets follow up in 6 months  Please try to get signed up for covid vaccine ASAP  Recommended follow up: Return in about 3-6 months (around 11/30/2019-02/29/2020) for follow up- or sooner if needed.

## 2019-09-02 DIAGNOSIS — M25561 Pain in right knee: Secondary | ICD-10-CM | POA: Diagnosis not present

## 2019-09-02 DIAGNOSIS — I712 Thoracic aortic aneurysm, without rupture: Secondary | ICD-10-CM | POA: Diagnosis not present

## 2019-09-02 DIAGNOSIS — J40 Bronchitis, not specified as acute or chronic: Secondary | ICD-10-CM | POA: Diagnosis not present

## 2019-09-02 DIAGNOSIS — I700 Atherosclerosis of aorta: Secondary | ICD-10-CM | POA: Diagnosis not present

## 2019-09-02 DIAGNOSIS — K219 Gastro-esophageal reflux disease without esophagitis: Secondary | ICD-10-CM | POA: Diagnosis not present

## 2019-09-02 DIAGNOSIS — N183 Chronic kidney disease, stage 3 unspecified: Secondary | ICD-10-CM | POA: Diagnosis not present

## 2019-09-02 DIAGNOSIS — H3530 Unspecified macular degeneration: Secondary | ICD-10-CM | POA: Diagnosis not present

## 2019-09-02 DIAGNOSIS — R1312 Dysphagia, oropharyngeal phase: Secondary | ICD-10-CM | POA: Diagnosis not present

## 2019-09-02 DIAGNOSIS — I5030 Unspecified diastolic (congestive) heart failure: Secondary | ICD-10-CM | POA: Diagnosis not present

## 2019-09-02 DIAGNOSIS — R531 Weakness: Secondary | ICD-10-CM | POA: Diagnosis not present

## 2019-09-02 DIAGNOSIS — M1990 Unspecified osteoarthritis, unspecified site: Secondary | ICD-10-CM | POA: Diagnosis not present

## 2019-09-02 DIAGNOSIS — I130 Hypertensive heart and chronic kidney disease with heart failure and stage 1 through stage 4 chronic kidney disease, or unspecified chronic kidney disease: Secondary | ICD-10-CM | POA: Diagnosis not present

## 2019-09-02 DIAGNOSIS — I872 Venous insufficiency (chronic) (peripheral): Secondary | ICD-10-CM | POA: Diagnosis not present

## 2019-09-02 DIAGNOSIS — Z9181 History of falling: Secondary | ICD-10-CM | POA: Diagnosis not present

## 2019-09-06 NOTE — Progress Notes (Signed)
Subjective: 86 y.o. returns the office today for painful, elongated, thickened toenails he they cannot trim himself. Denies any redness or drainage around the nails. Denies any acute changes since last appointment and no new complaints today. Denies any systemic complaints such as fevers, chills, nausea, vomiting.   PCP: [name], MD  Objective: AAO 3, NAD DP/PT pulses palpable, CRT less than 3 seconds Nails hypertrophic, dystrophic, elongated, brittle, discolored 10. There is tenderness overlying the nails 1-5 bilaterally. There is no surrounding erythema or drainage along the nail sites. No open lesions or pre-ulcerative lesions are identified. No other areas of tenderness bilateral lower extremities. No overlying edema, erythema, increased warmth. No pain with calf compression, swelling, warmth, erythema.  Assessment: Patient presents with symptomatic onychomycosis  Plan: -Treatment options including alternatives, risks, complications were discussed -Nails sharply debrided 10 without complication/bleeding. -Discussed daily foot inspection. If there are any changes, to call the office immediately.  -Follow-up in 3 months or sooner if any problems are to arise. In the meantime, encouraged to call the office with any questions, concerns, changes symptoms.  Celesta Gentile, DPM

## 2019-09-08 DIAGNOSIS — K219 Gastro-esophageal reflux disease without esophagitis: Secondary | ICD-10-CM | POA: Diagnosis not present

## 2019-09-08 DIAGNOSIS — R531 Weakness: Secondary | ICD-10-CM | POA: Diagnosis not present

## 2019-09-08 DIAGNOSIS — J40 Bronchitis, not specified as acute or chronic: Secondary | ICD-10-CM | POA: Diagnosis not present

## 2019-09-08 DIAGNOSIS — N183 Chronic kidney disease, stage 3 unspecified: Secondary | ICD-10-CM | POA: Diagnosis not present

## 2019-09-08 DIAGNOSIS — M25561 Pain in right knee: Secondary | ICD-10-CM | POA: Diagnosis not present

## 2019-09-08 DIAGNOSIS — M1990 Unspecified osteoarthritis, unspecified site: Secondary | ICD-10-CM | POA: Diagnosis not present

## 2019-09-08 DIAGNOSIS — Z9181 History of falling: Secondary | ICD-10-CM | POA: Diagnosis not present

## 2019-09-08 DIAGNOSIS — I872 Venous insufficiency (chronic) (peripheral): Secondary | ICD-10-CM | POA: Diagnosis not present

## 2019-09-08 DIAGNOSIS — I5030 Unspecified diastolic (congestive) heart failure: Secondary | ICD-10-CM | POA: Diagnosis not present

## 2019-09-08 DIAGNOSIS — R1312 Dysphagia, oropharyngeal phase: Secondary | ICD-10-CM | POA: Diagnosis not present

## 2019-09-08 DIAGNOSIS — I700 Atherosclerosis of aorta: Secondary | ICD-10-CM | POA: Diagnosis not present

## 2019-09-08 DIAGNOSIS — H3530 Unspecified macular degeneration: Secondary | ICD-10-CM | POA: Diagnosis not present

## 2019-09-08 DIAGNOSIS — I712 Thoracic aortic aneurysm, without rupture: Secondary | ICD-10-CM | POA: Diagnosis not present

## 2019-09-08 DIAGNOSIS — I130 Hypertensive heart and chronic kidney disease with heart failure and stage 1 through stage 4 chronic kidney disease, or unspecified chronic kidney disease: Secondary | ICD-10-CM | POA: Diagnosis not present

## 2019-09-13 ENCOUNTER — Other Ambulatory Visit: Payer: Self-pay | Admitting: Family Medicine

## 2019-09-13 ENCOUNTER — Other Ambulatory Visit: Payer: Self-pay

## 2019-09-13 NOTE — Patient Outreach (Signed)
[facility]) Care Management  09/13/2019  Roger [name] [facility] [birth year] [id number]   [facility] case closure due to inability to contact.  Ronn Melena, BSW Social Worker [phone]

## 2019-09-14 DIAGNOSIS — R531 Weakness: Secondary | ICD-10-CM | POA: Diagnosis not present

## 2019-09-14 DIAGNOSIS — J40 Bronchitis, not specified as acute or chronic: Secondary | ICD-10-CM | POA: Diagnosis not present

## 2019-09-14 DIAGNOSIS — N183 Chronic kidney disease, stage 3 unspecified: Secondary | ICD-10-CM | POA: Diagnosis not present

## 2019-09-14 DIAGNOSIS — Z9181 History of falling: Secondary | ICD-10-CM | POA: Diagnosis not present

## 2019-09-14 DIAGNOSIS — I872 Venous insufficiency (chronic) (peripheral): Secondary | ICD-10-CM | POA: Diagnosis not present

## 2019-09-14 DIAGNOSIS — I700 Atherosclerosis of aorta: Secondary | ICD-10-CM | POA: Diagnosis not present

## 2019-09-14 DIAGNOSIS — M25561 Pain in right knee: Secondary | ICD-10-CM | POA: Diagnosis not present

## 2019-09-14 DIAGNOSIS — I712 Thoracic aortic aneurysm, without rupture: Secondary | ICD-10-CM | POA: Diagnosis not present

## 2019-09-14 DIAGNOSIS — H3530 Unspecified macular degeneration: Secondary | ICD-10-CM | POA: Diagnosis not present

## 2019-09-14 DIAGNOSIS — R1312 Dysphagia, oropharyngeal phase: Secondary | ICD-10-CM | POA: Diagnosis not present

## 2019-09-14 DIAGNOSIS — I130 Hypertensive heart and chronic kidney disease with heart failure and stage 1 through stage 4 chronic kidney disease, or unspecified chronic kidney disease: Secondary | ICD-10-CM | POA: Diagnosis not present

## 2019-09-14 DIAGNOSIS — M1990 Unspecified osteoarthritis, unspecified site: Secondary | ICD-10-CM | POA: Diagnosis not present

## 2019-09-14 DIAGNOSIS — I5030 Unspecified diastolic (congestive) heart failure: Secondary | ICD-10-CM | POA: Diagnosis not present

## 2019-09-14 DIAGNOSIS — K219 Gastro-esophageal reflux disease without esophagitis: Secondary | ICD-10-CM | POA: Diagnosis not present

## 2019-09-19 ENCOUNTER — Other Ambulatory Visit: Payer: Self-pay | Admitting: Family Medicine

## 2019-09-27 DIAGNOSIS — H2511 Age-related nuclear cataract, right eye: Secondary | ICD-10-CM | POA: Diagnosis not present

## 2019-09-27 DIAGNOSIS — H353132 Nonexudative age-related macular degeneration, bilateral, intermediate dry stage: Secondary | ICD-10-CM | POA: Diagnosis not present

## 2019-09-27 DIAGNOSIS — H35363 Drusen (degenerative) of macula, bilateral: Secondary | ICD-10-CM | POA: Diagnosis not present

## 2019-10-04 NOTE — Telephone Encounter (Signed)
Error

## 2019-10-10 ENCOUNTER — Other Ambulatory Visit: Payer: Self-pay | Admitting: Family Medicine

## 2019-10-11 NOTE — Telephone Encounter (Signed)
Called patient states that he does take every morning and it helps with cough all day. Would like refill

## 2019-10-12 ENCOUNTER — Other Ambulatory Visit: Payer: Self-pay

## 2019-10-12 MED ORDER — BENZONATATE 100 MG PO CAPS: 100.0000 mg | ORAL_CAPSULE | Freq: Two times a day (BID) | ORAL | 0 refills | Status: DC | PRN

## 2019-10-16 ENCOUNTER — Ambulatory Visit: Payer: Medicare Other | Attending: Internal Medicine

## 2019-10-16 DIAGNOSIS — Z23 Encounter for immunization: Secondary | ICD-10-CM | POA: Insufficient documentation

## 2019-10-16 NOTE — Progress Notes (Signed)
   Covid-19 Vaccination Clinic  Name:  Roger [name]    MRN: [id number] DOB: Jan 04, 1920  10/16/2019  Roger [name] was observed post Covid-19 immunization for 15 minutes without incidence. He was provided with Vaccine Information Sheet and instruction to access the V-Safe system.   Roger [name] was instructed to call 911 with any severe reactions post vaccine: [name] Difficulty breathing  . Swelling of your face and throat  . A fast heartbeat  . A bad rash all over your body  . Dizziness and weakness    Immunizations Administered    Name Date Dose VIS Date Route   Pfizer COVID-19 Vaccine 10/16/2019 12:26 PM 0.3 mL 08/05/2019 Intramuscular   Manufacturer: [place]   Lot: [id number]   [place]: [id number]

## 2019-10-17 ENCOUNTER — Telehealth: Payer: Self-pay

## 2019-10-17 NOTE — Telephone Encounter (Signed)
Patient daughter calling in because she states that her father needs a wheel chair and [employer] will cover this but they would need an authorization from Dr. Yong Channel in order to get this wheelchair for patient.

## 2019-10-24 NOTE — Telephone Encounter (Signed)
You will need see in office to get note right.

## 2019-10-24 NOTE — Telephone Encounter (Signed)
Prescription pad-Z 91.81 with request for wheelchair.  I will sign

## 2019-10-25 NOTE — Telephone Encounter (Signed)
Some patients do have a reaction to the first vaccine and second reaction can sometimes actually be less severe.  I still would be in favor of him getting the second vaccination.  I agree there is a chance he could have a poor outcome from vaccination but I still am more concerned about him contracting COVID-19 so I think the benefits outweigh the risks  I am happy to fill out anything to try to help him-they can have this sent in and we we will see if we can fill this out or needeoffice visit

## 2019-10-25 NOTE — Telephone Encounter (Signed)
Called and spoke with pt daughter and she wanted me to pass along to you that pt got the first pfizer vaccine 8 days ago and he has been experiencing fatigue and muscle weakness since then. She wants to know should he get the second dose since she has heard that's when the majority of the side affects kick in, she dosent want him to end up in the hospital.  Also, they aren't needing wheelchairs. They are needing a Rx for medical transport so that [facility] can send out transportation for them to get to places without having to get In and out of their wheelchairs to get in cars.

## 2019-10-25 NOTE — Telephone Encounter (Signed)
Patients daughter called in this afternoon and is letting Bevelyn Ngo know these are the numbers to contact insurance [phone] providers/ [phone] customers

## 2019-10-26 NOTE — Telephone Encounter (Signed)
Called [employer] and got the wheelchair transportation approved. Called and lm on pt daughter vm to confirm

## 2019-10-26 NOTE — Telephone Encounter (Signed)
Called and spoke with pt daughter and she is going to call [facility] again to see what the proper procedure is to obtain authorization for medical transport for this as well as call [employer].

## 2019-11-03 ENCOUNTER — Other Ambulatory Visit: Payer: Self-pay | Admitting: Family Medicine

## 2019-11-03 NOTE — Telephone Encounter (Signed)
LAST APPOINTMENT DATE: @@LASTENCT @  NEXT APPOINTMENT DATE:@4 /03/2020   LAST REFILL:  QTY:

## 2019-11-07 ENCOUNTER — Other Ambulatory Visit: Payer: Self-pay

## 2019-11-07 ENCOUNTER — Encounter: Payer: Self-pay | Admitting: Podiatry

## 2019-11-07 ENCOUNTER — Ambulatory Visit: Payer: Medicare Other | Admitting: Podiatry

## 2019-11-07 DIAGNOSIS — M79674 Pain in right toe(s): Secondary | ICD-10-CM | POA: Diagnosis not present

## 2019-11-07 DIAGNOSIS — M79675 Pain in left toe(s): Secondary | ICD-10-CM | POA: Diagnosis not present

## 2019-11-07 DIAGNOSIS — B351 Tinea unguium: Secondary | ICD-10-CM | POA: Diagnosis not present

## 2019-11-07 MED ORDER — MUPIROCIN 2 % EX OINT: 1.0000 "application " | TOPICAL_OINTMENT | Freq: Two times a day (BID) | CUTANEOUS | 2 refills | Status: DC

## 2019-11-07 MED ORDER — DOXYCYCLINE HYCLATE 100 MG PO TABS: 100.0000 mg | ORAL_TABLET | Freq: Two times a day (BID) | ORAL | 0 refills | Status: DC

## 2019-11-09 ENCOUNTER — Ambulatory Visit: Payer: Medicare Other | Attending: Internal Medicine

## 2019-11-09 DIAGNOSIS — R6889 Other general symptoms and signs: Secondary | ICD-10-CM | POA: Diagnosis not present

## 2019-11-09 DIAGNOSIS — Z23 Encounter for immunization: Secondary | ICD-10-CM

## 2019-11-09 NOTE — Progress Notes (Signed)
   Covid-19 Vaccination Clinic  Name:  DON SAUVAGEAU    MRN: [id number] DOB: 11-11-1919  11/09/2019  Mr. Canterberry was observed post Covid-19 immunization for 15 minutes without incident. He was provided with Vaccine Information Sheet and instruction to access the V-Safe system.   Mr. Runnells was instructed to call 911 with any severe reactions post vaccine: [name] Difficulty breathing  . Swelling of face and throat  . A fast heartbeat  . A bad rash all over body  . Dizziness and weakness   Immunizations Administered    Name Date Dose VIS Date Route   Pfizer COVID-19 Vaccine 11/09/2019 12:05 PM 0.3 mL 08/05/2019 Intramuscular   Manufacturer: [place]   Lot: [id number]   [place]: [id number]

## 2019-11-09 NOTE — Progress Notes (Addendum)
Subjective: 86 y.o. returns the office today for painful, elongated, thickened toenails he they cannot trim himself. Denies any redness or drainage around the nails.  Denies any systemic complaints such as fevers, chills, nausea, vomiting.   PCP: [name], MD  Objective: AAO 3, NAD DP/PT pulses palpable, CRT less than 3 seconds Nails hypertrophic, dystrophic, elongated, brittle, discolored 10. There is tenderness overlying the nails 1-5 bilaterally. There is no surrounding erythema or drainage along the nail sites. No open lesions or pre-ulcerative lesions are identified. No other areas of tenderness bilateral lower extremities. No overlying edema, erythema, increased warmth. No pain with calf compression, swelling, warmth, erythema.  Assessment: Patient presents with symptomatic onychomycosis  Plan: -Treatment options including alternatives, risks, complications were discussed -Nails sharply debrided 10 without complication/bleeding. -Discussed daily foot inspection. If there are any changes, to call the office immediately.  -Follow-up in 3 months or sooner if any problems are to arise. In the meantime, encouraged to call the office with any questions, concerns, changes symptoms.  Celesta Gentile, DPM  *Found that the superficial wound on the dorsal aspect left second toe along the PIPJ there is localized edema erythema.  He was placed on doxycycline.  The wound did not have any probing, undermining or tunneling.  Chronic swelling to bilateral lower extremities with faint warmth to the left leg.  No pain with calf compression.

## 2019-11-22 ENCOUNTER — Ambulatory Visit: Payer: Medicare Other | Admitting: Podiatry

## 2019-11-22 ENCOUNTER — Other Ambulatory Visit: Payer: Self-pay

## 2019-11-22 ENCOUNTER — Encounter: Payer: Self-pay | Admitting: Podiatry

## 2019-11-22 DIAGNOSIS — R2681 Unsteadiness on feet: Secondary | ICD-10-CM

## 2019-11-22 DIAGNOSIS — L03116 Cellulitis of left lower limb: Secondary | ICD-10-CM

## 2019-11-22 DIAGNOSIS — L84 Corns and callosities: Secondary | ICD-10-CM | POA: Diagnosis not present

## 2019-11-22 DIAGNOSIS — M7989 Other specified soft tissue disorders: Secondary | ICD-10-CM | POA: Diagnosis not present

## 2019-11-22 MED ORDER — MUPIROCIN 2 % EX OINT: 1.0000 "application " | TOPICAL_OINTMENT | Freq: Two times a day (BID) | CUTANEOUS | 2 refills | Status: AC

## 2019-11-24 ENCOUNTER — Telehealth: Payer: Self-pay | Admitting: *Deleted

## 2019-11-24 DIAGNOSIS — R2681 Unsteadiness on feet: Secondary | ICD-10-CM

## 2019-11-24 DIAGNOSIS — M79674 Pain in right toe(s): Secondary | ICD-10-CM

## 2019-11-24 NOTE — Telephone Encounter (Signed)
Dr. Jacqualyn Posey states pt's family member receives in home PT from [facility] and the physical therapist, Gearldine Shown states she can assist pt at the same time. Dr. Jacqualyn Posey ordered PT focusing on gait training and generalized strength training. Faxed LOV note, demographics and required form to [facility] Attn: Gearldine Shown.

## 2019-11-28 NOTE — Progress Notes (Signed)
Subjective: 86 year old male presents the office today for follow-up evaluation of a wound, infection to the left second toe as well as redness and swelling to the left leg.  He did not complete the antibiotics as it was upsetting his stomach since stopping this resolves.  No significant diarrhea.  Overall he is doing better.  His caregiver is asking for physical therapy to help with gait and to keep hin walking as much as possible.Denies any systemic complaints such as fevers, chills, nausea, vomiting. No acute changes since last appointment, and no other complaints at this time.   Objective: AAO x3, NAD DP/PT pulses palpable bilaterally, CRT less than 3 seconds To the dorsal aspect left second toe is a preulcerative area with a hyperkeratotic lesion.  Upon debridement there is no ongoing ulceration drainage, there is no fluctuation crepitation.  There is no malodor.  Minimal edema.  In general there is edema to bilateral lower extremities with faint erythema but there is no warmth or drainage.  There is no fluctuation or crepitation.  I think this is more from his venous insufficiency. No pain with calf compression, swelling, warmth, erythema  Assessment: Improved wound left second toe; gait instability  Plan: -All treatment options discussed with the patient including all alternatives, risks, complications.  -Debrided the hyperkeratotic lesion second toe any complications or bleeding.  Continue mupirocin ointment dressing changes daily and offloading.  Area is preulcerative but there is no definitive skin breakdown today.  He does have compression socks he has been wearing.  Encouraged elevation.  Order for physical therapy for [facility] home [facility] was placed today. -Patient encouraged to call the office with any questions, concerns, change in symptoms.   Trula Slade DPM

## 2019-11-30 ENCOUNTER — Other Ambulatory Visit: Payer: Self-pay | Admitting: Family Medicine

## 2019-11-30 DIAGNOSIS — N183 Chronic kidney disease, stage 3 unspecified: Secondary | ICD-10-CM | POA: Diagnosis not present

## 2019-11-30 DIAGNOSIS — I872 Venous insufficiency (chronic) (peripheral): Secondary | ICD-10-CM | POA: Diagnosis not present

## 2019-11-30 DIAGNOSIS — H3530 Unspecified macular degeneration: Secondary | ICD-10-CM | POA: Diagnosis not present

## 2019-11-30 DIAGNOSIS — L84 Corns and callosities: Secondary | ICD-10-CM | POA: Diagnosis not present

## 2019-11-30 DIAGNOSIS — Z9181 History of falling: Secondary | ICD-10-CM | POA: Diagnosis not present

## 2019-11-30 DIAGNOSIS — E785 Hyperlipidemia, unspecified: Secondary | ICD-10-CM | POA: Diagnosis not present

## 2019-11-30 DIAGNOSIS — R2681 Unsteadiness on feet: Secondary | ICD-10-CM | POA: Diagnosis not present

## 2019-11-30 DIAGNOSIS — I130 Hypertensive heart and chronic kidney disease with heart failure and stage 1 through stage 4 chronic kidney disease, or unspecified chronic kidney disease: Secondary | ICD-10-CM | POA: Diagnosis not present

## 2019-11-30 DIAGNOSIS — M1990 Unspecified osteoarthritis, unspecified site: Secondary | ICD-10-CM | POA: Diagnosis not present

## 2019-11-30 DIAGNOSIS — L03116 Cellulitis of left lower limb: Secondary | ICD-10-CM | POA: Diagnosis not present

## 2019-11-30 DIAGNOSIS — I5030 Unspecified diastolic (congestive) heart failure: Secondary | ICD-10-CM | POA: Diagnosis not present

## 2019-12-01 ENCOUNTER — Ambulatory Visit (INDEPENDENT_AMBULATORY_CARE_PROVIDER_SITE_OTHER): Payer: Medicare Other | Admitting: Family Medicine

## 2019-12-01 ENCOUNTER — Other Ambulatory Visit: Payer: Self-pay

## 2019-12-01 ENCOUNTER — Encounter: Payer: Self-pay | Admitting: Family Medicine

## 2019-12-01 VITALS — BP 104/60 | HR 57 | Temp 97.7°F | Ht 68.5 in | Wt 147.0 lb

## 2019-12-01 DIAGNOSIS — E785 Hyperlipidemia, unspecified: Secondary | ICD-10-CM | POA: Diagnosis not present

## 2019-12-01 DIAGNOSIS — N3281 Overactive bladder: Secondary | ICD-10-CM | POA: Diagnosis not present

## 2019-12-01 DIAGNOSIS — I5189 Other ill-defined heart diseases: Secondary | ICD-10-CM

## 2019-12-01 DIAGNOSIS — I7121 Aneurysm of the ascending aorta, without rupture: Secondary | ICD-10-CM

## 2019-12-01 DIAGNOSIS — I10 Essential (primary) hypertension: Secondary | ICD-10-CM

## 2019-12-01 DIAGNOSIS — F325 Major depressive disorder, single episode, in full remission: Secondary | ICD-10-CM

## 2019-12-01 DIAGNOSIS — I712 Thoracic aortic aneurysm, without rupture: Secondary | ICD-10-CM

## 2019-12-01 MED ORDER — FUROSEMIDE 20 MG PO TABS: 20.0000 mg | ORAL_TABLET | Freq: Every day | ORAL | 1 refills | Status: DC | PRN

## 2019-12-01 NOTE — Progress Notes (Signed)
Phone [phone] In person visit   Subjective:   Roger [name] is a 86 y.o. year old very pleasant male patient who presents for/with See problem oriented charting Chief Complaint  Patient presents with  . Follow-up  . Depression  . Hyperlipidemia  . Hypertension   This visit occurred during the SARS-CoV-2 public health emergency.  Safety protocols were in place, including screening questions prior to the visit, additional usage of staff PPE, and extensive cleaning of exam room while observing appropriate contact time as indicated for disinfecting solutions.   Past Medical History-  Patient Active Problem List   Diagnosis Date Noted  . Ascending aortic aneurysm ([place]) 10/17/2017    Priority: High  . Diastolic dysfunction [id number]    Priority: High  . Overactive bladder 07/13/2014    Priority: High  . Allergic conjunctivitis of both eyes 06/22/2017    Priority: Medium  . Depression, major, single episode, complete remission ([place]) 07/05/2015    Priority: Medium  . History of pneumonia 03/10/2015    Priority: Medium  . Chronic cough 10/11/2014    Priority: Medium  . CKD (chronic kidney disease), stage III ([place]) 07/13/2014    Priority: Medium  . Syncope 11/17/2013    Priority: Medium  . Hypertension 06/17/2007    Priority: Medium  . Hyperlipidemia 02/22/2007    Priority: Medium  . Aortic atherosclerosis ([place]) 10/17/2017    Priority: Low  . Bronchitis 08/29/2017    Priority: Low  . Venous (peripheral) insufficiency 01/26/2015    Priority: Low  . Macular degeneration 06/19/2011    Priority: Low  . Osteoarthritis 06/05/2009    Priority: Low  . CARCINOMA, SKIN, SQUAMOUS CELL 05/26/2008    Priority: Low  . GERD (gastroesophageal reflux disease) 02/22/2007    Priority: Low  . Right hip pain 09/13/2018  . Laceration of scalp 10/17/2017  . Onychomycosis 10/22/2016    Medications- reviewed and updated Current Outpatient Medications  Medication Sig Dispense  Refill  . benzonatate (TESSALON) 100 MG capsule TAKE 1 CAPSULE (100 MG TOTAL) BY MOUTH 2 (TWO) TIMES DAILY AS NEEDED FOR COUGH. 30 capsule 0  . furosemide (LASIX) 20 MG tablet Take 1 tablet (20 mg total) by mouth daily as needed (weight gain or increased leg swelling). 90 tablet 1  . lovastatin (MEVACOR) 40 MG tablet Take 1 tablet (40 mg total) by mouth daily. 90 tablet 1  . Multiple Vitamins-Minerals (MULTIVITAMIN WITH MINERALS) tablet Take 1 tablet by mouth daily.    . mupirocin ointment (BACTROBAN) 2 % Apply 1 application topically 2 (two) times daily. 30 g 2  . sertraline (ZOLOFT) 50 MG tablet 1 and 1/2 tab daily 135 tablet 3  . UNABLE TO FIND Med Name: Med pass 120 mL by mouth twice daily for supplement     No current facility-administered medications for this visit.     Objective:  BP 104/60   Pulse (!) 57   Temp 97.7 F (36.5 C)   Ht 5' 8.5" (1.74 m)   Wt 147 lb (66.7 kg)   SpO2 96%   BMI 22.03 kg/m  Gen: NAD, resting comfortably, hard of hearing CV: RRR no murmurs rubs or gallops Lungs: CTAB no crackles, wheeze, rhonchi Ext: 1+ edema Skin: warm, dry    Assessment and Plan    Diastolic dysfunction S: Patient with diastolic dysfunction on echocardiogram in 2017.  Patient has never had a hospitalization for fluid overload but I am aware.No recent increased edema.  Weight is up a few pounds  but has lost a significant amount at last visit and compared to October weight is down  Patient also has ongoing issues with overactive bladder.  He is off oxybutynin due to history of urinary retention.  Urinary frequency is a big point of stress for him-he frequently has incontinence when he has to rush to the bathroom.  He would really like to go back on oxybutynin  Today patient did not take his Lasix and he has not had this issue A/P: When I reviewed patient's chart-I did not see any obvious hospitalizations or overt cases of fluid overload.  Patient has CKD stage III/venous  insufficiency which likely contributes to his edema.  He does have 1+ edema even with regularly taking his Lasix but he is not using his compression stockings today.  I previously had patient listed as chronic diastolic CHF.I discussed with patient that over the years I have read more/studied more and recognized that many cardiologist do not consider heart failure to only be edema plus diastolic dysfunction.  I think it is at least reasonable to trial him off the Lasix on a scheduled basis and change to as needed for weight gain or edema given his overactive bladder issues seem to be better off of it.  Oxybutynin also is not a good option due to urinary retention history.  Patient is going to watch his weight/edema closely.  He agrees to use compression stockings regularly-family and home aides will help with this.  If he is unable to tolerate being off Lasix we may have to restart  Overactive bladder Poor control.   In past- Oxybutynin 2.5mg  (stop firmly 09/28/17 when complaining or retention symptoms)   see diastolic dysfunction section but we are going to try him off Lasix to see how he does.  We also discussed possibly referring to urology if he does not do well with this.  We also discussed using depends  Hypertension S: compliant with Lasix 20 mg daily  BP Readings from Last 3 Encounters:  12/01/19 104/60  09/01/19 130/86  05/09/19 120/82  A/P: Blood pressure is well controlled today without having taken Lasix this morning-I am hopeful he can tolerate being off of this-the diastolic dysfunction section.  From blood pressure reading today I do think he can tolerate not being on Lasix   #Ascending aortic aneurysm S: Patient and I again discussed ascending aortic aneurysm noted in 2019 at 4.2 cm A/P: We discussed possibly repeating CT scan-patient would not be interested in surgery so this would be unlikely to change management.  We are removing his Lasix and we do need to monitor blood  pressure as I would like to keep him less than 135/85 if possible-I understand that lower typically is better with aortic aneurysms but I want to be cautious at his age  #hyperlipidemia S: compliant with lovastatin Lab Results  Component Value Date   CHOL 137 05/09/2019   HDL 52.00 05/09/2019   LDLCALC 61 05/09/2019   LDLDIRECT 77.0 10/16/2016   TRIG 118.0 05/09/2019   CHOLHDL 3 05/09/2019   A/P: Excellent control on last check-continue current medication  # Depression S: Patient is compliant with Zoloft 75 mg Depression screen PHQ 2/9 12/01/2019  Decreased Interest 0  Down, Depressed, Hopeless 1  PHQ - 2 Score 1  Altered sleeping 0  Tired, decreased energy 0  Change in appetite 0  Feeling bad or failure about yourself  0  Trouble concentrating 0  Moving slowly or fidgety/restless 0  Suicidal thoughts  0  PHQ-9 Score 1  Difficult doing work/chores Not difficult at all  Some recent data might be hidden  A/P: Depression appears to be in full remission-continue current medicine  #Constipation-patient has noted some constipation but seems to do better when avoiding hot cross bun's.  I recommended he avoid the hot cross buns  #Chronic cough-prior speech therapy seem to be very helpful for postprandial cough.  Patient continues to report improvement in cough  #Has CKD stage III-patient has not follow-up in 2 months and we will update blood work at that time  #Please see after visit summary instructions about weight/edema  Recommended follow up: Return in about 2 months (around 01/31/2020) for follow up- or sooner if needed. Future Appointments  Date Time Provider [place]  12/13/2019  2:00 PM Trula Slade, [place] [facility]  02/10/2020 11:20 AM Yong Channel, [place] Mars, MD [facility]    Lab/Order associations:   ICD-10-CM   1. Essential hypertension  I10   2. Diastolic dysfunction  123XX123   3. Overactive bladder  N32.81   4. Ascending aortic aneurysm ([facility])   I71.2   5. Hyperlipidemia, unspecified hyperlipidemia type  E78.5   6. Depression, major, single episode, complete remission ([place])  F32.5    Meds ordered this encounter  Medications  . furosemide (LASIX) 20 MG tablet    Sig: Take 1 tablet (20 mg total) by mouth daily as needed (weight gain or increased leg swelling).    Dispense:  90 tablet    Refill:  1   Return precautions advised.  Garret Reddish, MD

## 2019-12-01 NOTE — Assessment & Plan Note (Signed)
Poor control.   In past- Oxybutynin 2.5mg  (stop firmly 09/28/17 when complaining or retention symptoms)   see diastolic dysfunction section but we are going to try him off Lasix to see how he does.  We also discussed possibly referring to urology if he does not do well with this.  We also discussed using depends

## 2019-12-01 NOTE — Patient Instructions (Addendum)
Lets try off lasix for now on scheduled basis- and change to as needed for weight gain  As long as your weight doesn't increase 3 lbs in a day or 5 lbs in 3 days or even over a few weeks- lets try to stay off the lasix. Keep a log at home of your weights and if you go up 5 lbs from your weight tomorrow morning- can take lasix as needed. Can also take lasix for swelling in the leg  I also need you to watch your blood pressure off the lasix- it was pretty low today at 104/60 so I think you can be off the lasix- let me know if your #s get over 135/85  I think colace/docusate stool softener is reasonable on daily basis. You could take a sparing senokot at bedtime  Could try flonase if needed for allergies or nasocort or pills-  xyzal or allegra before bed. Do not take a decongestant.   Recommended follow up: Return in about 2 months (around 01/31/2020) for follow up- or sooner if needed.

## 2019-12-01 NOTE — Assessment & Plan Note (Signed)
S: compliant with Lasix 20 mg daily  BP Readings from Last 3 Encounters:  12/01/19 104/60  09/01/19 130/86  05/09/19 120/82  A/P: Blood pressure is well controlled today without having taken Lasix this morning-I am hopeful he can tolerate being off of this-the diastolic dysfunction section.  From blood pressure reading today I do think he can tolerate not being on Lasix

## 2019-12-01 NOTE — Assessment & Plan Note (Addendum)
S: Patient with diastolic dysfunction on echocardiogram in 2017.  Patient has never had a hospitalization for fluid overload but I am aware.No recent increased edema.  Weight is up a few pounds but has lost a significant amount at last visit and compared to October weight is down  Patient also has ongoing issues with overactive bladder.  He is off oxybutynin due to history of urinary retention.  Urinary frequency is a big point of stress for him-he frequently has incontinence when he has to rush to the bathroom.  He would really like to go back on oxybutynin  Today patient did not take his Lasix and he has not had this issue A/P: When I reviewed patient's chart-I did not see any obvious hospitalizations or overt cases of fluid overload.  Patient has CKD stage III/venous insufficiency which likely contributes to his edema.  He does have 1+ edema even with regularly taking his Lasix but he is not using his compression stockings today.  I previously had patient listed as chronic diastolic CHF.I discussed with patient that over the years I have read more/studied more and recognized that many cardiologist do not consider heart failure to only be edema plus diastolic dysfunction.  I think it is at least reasonable to trial him off the Lasix on a scheduled basis and change to as needed for weight gain or edema given his overactive bladder issues seem to be better off of it.  Oxybutynin also is not a good option due to urinary retention history.  Patient is going to watch his weight/edema closely.  He agrees to use compression stockings regularly-family and home aides will help with this.  If he is unable to tolerate being off Lasix we may have to restart

## 2019-12-06 DIAGNOSIS — R2681 Unsteadiness on feet: Secondary | ICD-10-CM | POA: Diagnosis not present

## 2019-12-06 DIAGNOSIS — L84 Corns and callosities: Secondary | ICD-10-CM | POA: Diagnosis not present

## 2019-12-06 DIAGNOSIS — I872 Venous insufficiency (chronic) (peripheral): Secondary | ICD-10-CM | POA: Diagnosis not present

## 2019-12-06 DIAGNOSIS — H3530 Unspecified macular degeneration: Secondary | ICD-10-CM | POA: Diagnosis not present

## 2019-12-06 DIAGNOSIS — Z9181 History of falling: Secondary | ICD-10-CM | POA: Diagnosis not present

## 2019-12-06 DIAGNOSIS — I5030 Unspecified diastolic (congestive) heart failure: Secondary | ICD-10-CM | POA: Diagnosis not present

## 2019-12-06 DIAGNOSIS — M1990 Unspecified osteoarthritis, unspecified site: Secondary | ICD-10-CM | POA: Diagnosis not present

## 2019-12-06 DIAGNOSIS — L03116 Cellulitis of left lower limb: Secondary | ICD-10-CM | POA: Diagnosis not present

## 2019-12-06 DIAGNOSIS — I130 Hypertensive heart and chronic kidney disease with heart failure and stage 1 through stage 4 chronic kidney disease, or unspecified chronic kidney disease: Secondary | ICD-10-CM | POA: Diagnosis not present

## 2019-12-06 DIAGNOSIS — E785 Hyperlipidemia, unspecified: Secondary | ICD-10-CM | POA: Diagnosis not present

## 2019-12-06 DIAGNOSIS — N183 Chronic kidney disease, stage 3 unspecified: Secondary | ICD-10-CM | POA: Diagnosis not present

## 2019-12-08 DIAGNOSIS — Z9181 History of falling: Secondary | ICD-10-CM | POA: Diagnosis not present

## 2019-12-08 DIAGNOSIS — E785 Hyperlipidemia, unspecified: Secondary | ICD-10-CM | POA: Diagnosis not present

## 2019-12-08 DIAGNOSIS — N183 Chronic kidney disease, stage 3 unspecified: Secondary | ICD-10-CM | POA: Diagnosis not present

## 2019-12-08 DIAGNOSIS — I872 Venous insufficiency (chronic) (peripheral): Secondary | ICD-10-CM | POA: Diagnosis not present

## 2019-12-08 DIAGNOSIS — I5030 Unspecified diastolic (congestive) heart failure: Secondary | ICD-10-CM | POA: Diagnosis not present

## 2019-12-08 DIAGNOSIS — M1990 Unspecified osteoarthritis, unspecified site: Secondary | ICD-10-CM | POA: Diagnosis not present

## 2019-12-08 DIAGNOSIS — L03116 Cellulitis of left lower limb: Secondary | ICD-10-CM | POA: Diagnosis not present

## 2019-12-08 DIAGNOSIS — L84 Corns and callosities: Secondary | ICD-10-CM | POA: Diagnosis not present

## 2019-12-08 DIAGNOSIS — H3530 Unspecified macular degeneration: Secondary | ICD-10-CM | POA: Diagnosis not present

## 2019-12-08 DIAGNOSIS — I130 Hypertensive heart and chronic kidney disease with heart failure and stage 1 through stage 4 chronic kidney disease, or unspecified chronic kidney disease: Secondary | ICD-10-CM | POA: Diagnosis not present

## 2019-12-08 DIAGNOSIS — R2681 Unsteadiness on feet: Secondary | ICD-10-CM | POA: Diagnosis not present

## 2019-12-12 DIAGNOSIS — R2681 Unsteadiness on feet: Secondary | ICD-10-CM | POA: Diagnosis not present

## 2019-12-12 DIAGNOSIS — L84 Corns and callosities: Secondary | ICD-10-CM | POA: Diagnosis not present

## 2019-12-12 DIAGNOSIS — E785 Hyperlipidemia, unspecified: Secondary | ICD-10-CM | POA: Diagnosis not present

## 2019-12-12 DIAGNOSIS — L03116 Cellulitis of left lower limb: Secondary | ICD-10-CM | POA: Diagnosis not present

## 2019-12-12 DIAGNOSIS — M1990 Unspecified osteoarthritis, unspecified site: Secondary | ICD-10-CM | POA: Diagnosis not present

## 2019-12-12 DIAGNOSIS — I130 Hypertensive heart and chronic kidney disease with heart failure and stage 1 through stage 4 chronic kidney disease, or unspecified chronic kidney disease: Secondary | ICD-10-CM | POA: Diagnosis not present

## 2019-12-12 DIAGNOSIS — H3530 Unspecified macular degeneration: Secondary | ICD-10-CM | POA: Diagnosis not present

## 2019-12-12 DIAGNOSIS — N183 Chronic kidney disease, stage 3 unspecified: Secondary | ICD-10-CM | POA: Diagnosis not present

## 2019-12-12 DIAGNOSIS — Z9181 History of falling: Secondary | ICD-10-CM | POA: Diagnosis not present

## 2019-12-12 DIAGNOSIS — I872 Venous insufficiency (chronic) (peripheral): Secondary | ICD-10-CM | POA: Diagnosis not present

## 2019-12-12 DIAGNOSIS — I5030 Unspecified diastolic (congestive) heart failure: Secondary | ICD-10-CM | POA: Diagnosis not present

## 2019-12-13 ENCOUNTER — Other Ambulatory Visit: Payer: Self-pay

## 2019-12-13 ENCOUNTER — Ambulatory Visit: Payer: Medicare Other | Admitting: Podiatry

## 2019-12-13 DIAGNOSIS — R2681 Unsteadiness on feet: Secondary | ICD-10-CM

## 2019-12-13 DIAGNOSIS — L84 Corns and callosities: Secondary | ICD-10-CM | POA: Diagnosis not present

## 2019-12-14 DIAGNOSIS — E785 Hyperlipidemia, unspecified: Secondary | ICD-10-CM | POA: Diagnosis not present

## 2019-12-14 DIAGNOSIS — R2681 Unsteadiness on feet: Secondary | ICD-10-CM | POA: Diagnosis not present

## 2019-12-14 DIAGNOSIS — H3530 Unspecified macular degeneration: Secondary | ICD-10-CM | POA: Diagnosis not present

## 2019-12-14 DIAGNOSIS — I130 Hypertensive heart and chronic kidney disease with heart failure and stage 1 through stage 4 chronic kidney disease, or unspecified chronic kidney disease: Secondary | ICD-10-CM | POA: Diagnosis not present

## 2019-12-14 DIAGNOSIS — Z9181 History of falling: Secondary | ICD-10-CM | POA: Diagnosis not present

## 2019-12-14 DIAGNOSIS — L03116 Cellulitis of left lower limb: Secondary | ICD-10-CM | POA: Diagnosis not present

## 2019-12-14 DIAGNOSIS — N183 Chronic kidney disease, stage 3 unspecified: Secondary | ICD-10-CM | POA: Diagnosis not present

## 2019-12-14 DIAGNOSIS — I872 Venous insufficiency (chronic) (peripheral): Secondary | ICD-10-CM | POA: Diagnosis not present

## 2019-12-14 DIAGNOSIS — L84 Corns and callosities: Secondary | ICD-10-CM | POA: Diagnosis not present

## 2019-12-14 DIAGNOSIS — I5030 Unspecified diastolic (congestive) heart failure: Secondary | ICD-10-CM | POA: Diagnosis not present

## 2019-12-14 DIAGNOSIS — M1990 Unspecified osteoarthritis, unspecified site: Secondary | ICD-10-CM | POA: Diagnosis not present

## 2019-12-15 NOTE — Progress Notes (Signed)
Subjective: 86 year old male presents the office today for follow-up evaluation of a wound, infection to the left second toe as well as redness and swelling to the left leg. States that he is doing well.  Has a small scab on the top of his left second toe but they have not seen any drainage or pus.  Redness is much improved.  They have no new concerns today.  He has been doing physical therapy as well in the appendicular short amount of walking.  Denies any fevers, chills, nausea, vomiting.  No calf pain, chest pain, shortness of breath.  Objective: AAO x3, NAD Small hyperkeratotic lesion on the dorsal aspect of left second toe.  Is able to debride this and there is a very small minimal wound present the dorsal aspect of second toe.  There is no drainage or pus.  No significant edema, erythema.  No ascending cellulitis.  No other open lesions identified.  No redness or warmth of the leg. No pain with calf compression, swelling, warmth, erythema  Assessment: Improved wound left second toe; gait instability  Plan: -All treatment options discussed with the patient including all alternatives, risks, complications. -Debrided the hyperkeratotic lesion to the any complications or bleeding.  Continue a small amount of antibiotic ointment daily.  Continue offloading.  Monitoring signs or symptoms of infection. -Continue physical therapy.  Trula Slade DPM

## 2019-12-16 ENCOUNTER — Telehealth: Payer: Self-pay

## 2019-12-16 NOTE — Telephone Encounter (Signed)
Last visit on 12/01/2019. Patient was in full remission. Do you want to see in office to make change?

## 2019-12-16 NOTE — Telephone Encounter (Signed)
Patient daughter states that the [place] healthcare Nurse came out for a visit today and  notice that her father is depressed. Daughter would like to know could the dosage of he depression pill be increased.

## 2019-12-16 NOTE — Telephone Encounter (Signed)
I would share with patient's daughter that he reported feeling pretty good at last visit-if he has had worsening symptoms I do not mind going to Zoloft 100 mg and doing a recheck in 4 to 6 weeks

## 2019-12-16 NOTE — Telephone Encounter (Signed)
Called daughter she would like to increase to 100mg . I have made f/u with her. Will call and let us know if any questions or new symptoms.

## 2019-12-21 DIAGNOSIS — Z9181 History of falling: Secondary | ICD-10-CM | POA: Diagnosis not present

## 2019-12-21 DIAGNOSIS — N183 Chronic kidney disease, stage 3 unspecified: Secondary | ICD-10-CM | POA: Diagnosis not present

## 2019-12-21 DIAGNOSIS — M1990 Unspecified osteoarthritis, unspecified site: Secondary | ICD-10-CM | POA: Diagnosis not present

## 2019-12-21 DIAGNOSIS — L84 Corns and callosities: Secondary | ICD-10-CM | POA: Diagnosis not present

## 2019-12-21 DIAGNOSIS — E785 Hyperlipidemia, unspecified: Secondary | ICD-10-CM | POA: Diagnosis not present

## 2019-12-21 DIAGNOSIS — H3530 Unspecified macular degeneration: Secondary | ICD-10-CM | POA: Diagnosis not present

## 2019-12-21 DIAGNOSIS — I5030 Unspecified diastolic (congestive) heart failure: Secondary | ICD-10-CM | POA: Diagnosis not present

## 2019-12-21 DIAGNOSIS — I872 Venous insufficiency (chronic) (peripheral): Secondary | ICD-10-CM | POA: Diagnosis not present

## 2019-12-21 DIAGNOSIS — R2681 Unsteadiness on feet: Secondary | ICD-10-CM | POA: Diagnosis not present

## 2019-12-21 DIAGNOSIS — L03116 Cellulitis of left lower limb: Secondary | ICD-10-CM | POA: Diagnosis not present

## 2019-12-21 DIAGNOSIS — I130 Hypertensive heart and chronic kidney disease with heart failure and stage 1 through stage 4 chronic kidney disease, or unspecified chronic kidney disease: Secondary | ICD-10-CM | POA: Diagnosis not present

## 2019-12-23 DIAGNOSIS — Z9181 History of falling: Secondary | ICD-10-CM | POA: Diagnosis not present

## 2019-12-23 DIAGNOSIS — H3530 Unspecified macular degeneration: Secondary | ICD-10-CM | POA: Diagnosis not present

## 2019-12-23 DIAGNOSIS — I5030 Unspecified diastolic (congestive) heart failure: Secondary | ICD-10-CM | POA: Diagnosis not present

## 2019-12-23 DIAGNOSIS — M1990 Unspecified osteoarthritis, unspecified site: Secondary | ICD-10-CM | POA: Diagnosis not present

## 2019-12-23 DIAGNOSIS — N183 Chronic kidney disease, stage 3 unspecified: Secondary | ICD-10-CM | POA: Diagnosis not present

## 2019-12-23 DIAGNOSIS — R2681 Unsteadiness on feet: Secondary | ICD-10-CM | POA: Diagnosis not present

## 2019-12-23 DIAGNOSIS — I872 Venous insufficiency (chronic) (peripheral): Secondary | ICD-10-CM | POA: Diagnosis not present

## 2019-12-23 DIAGNOSIS — I130 Hypertensive heart and chronic kidney disease with heart failure and stage 1 through stage 4 chronic kidney disease, or unspecified chronic kidney disease: Secondary | ICD-10-CM | POA: Diagnosis not present

## 2019-12-23 DIAGNOSIS — L03116 Cellulitis of left lower limb: Secondary | ICD-10-CM | POA: Diagnosis not present

## 2019-12-23 DIAGNOSIS — L84 Corns and callosities: Secondary | ICD-10-CM | POA: Diagnosis not present

## 2019-12-23 DIAGNOSIS — E785 Hyperlipidemia, unspecified: Secondary | ICD-10-CM | POA: Diagnosis not present

## 2019-12-28 DIAGNOSIS — N183 Chronic kidney disease, stage 3 unspecified: Secondary | ICD-10-CM | POA: Diagnosis not present

## 2019-12-28 DIAGNOSIS — L03116 Cellulitis of left lower limb: Secondary | ICD-10-CM | POA: Diagnosis not present

## 2019-12-28 DIAGNOSIS — E785 Hyperlipidemia, unspecified: Secondary | ICD-10-CM | POA: Diagnosis not present

## 2019-12-28 DIAGNOSIS — R2681 Unsteadiness on feet: Secondary | ICD-10-CM | POA: Diagnosis not present

## 2019-12-28 DIAGNOSIS — I872 Venous insufficiency (chronic) (peripheral): Secondary | ICD-10-CM | POA: Diagnosis not present

## 2019-12-28 DIAGNOSIS — H3530 Unspecified macular degeneration: Secondary | ICD-10-CM | POA: Diagnosis not present

## 2019-12-28 DIAGNOSIS — I5030 Unspecified diastolic (congestive) heart failure: Secondary | ICD-10-CM | POA: Diagnosis not present

## 2019-12-28 DIAGNOSIS — I130 Hypertensive heart and chronic kidney disease with heart failure and stage 1 through stage 4 chronic kidney disease, or unspecified chronic kidney disease: Secondary | ICD-10-CM | POA: Diagnosis not present

## 2019-12-28 DIAGNOSIS — Z9181 History of falling: Secondary | ICD-10-CM | POA: Diagnosis not present

## 2019-12-28 DIAGNOSIS — M1990 Unspecified osteoarthritis, unspecified site: Secondary | ICD-10-CM | POA: Diagnosis not present

## 2019-12-28 DIAGNOSIS — L84 Corns and callosities: Secondary | ICD-10-CM | POA: Diagnosis not present

## 2019-12-29 ENCOUNTER — Telehealth: Payer: Self-pay

## 2019-12-29 DIAGNOSIS — I130 Hypertensive heart and chronic kidney disease with heart failure and stage 1 through stage 4 chronic kidney disease, or unspecified chronic kidney disease: Secondary | ICD-10-CM | POA: Diagnosis not present

## 2019-12-29 DIAGNOSIS — N183 Chronic kidney disease, stage 3 unspecified: Secondary | ICD-10-CM | POA: Diagnosis not present

## 2019-12-29 DIAGNOSIS — Z9181 History of falling: Secondary | ICD-10-CM | POA: Diagnosis not present

## 2019-12-29 DIAGNOSIS — E785 Hyperlipidemia, unspecified: Secondary | ICD-10-CM | POA: Diagnosis not present

## 2019-12-29 DIAGNOSIS — L03116 Cellulitis of left lower limb: Secondary | ICD-10-CM | POA: Diagnosis not present

## 2019-12-29 DIAGNOSIS — L84 Corns and callosities: Secondary | ICD-10-CM | POA: Diagnosis not present

## 2019-12-29 DIAGNOSIS — I872 Venous insufficiency (chronic) (peripheral): Secondary | ICD-10-CM | POA: Diagnosis not present

## 2019-12-29 DIAGNOSIS — M1990 Unspecified osteoarthritis, unspecified site: Secondary | ICD-10-CM | POA: Diagnosis not present

## 2019-12-29 DIAGNOSIS — H3530 Unspecified macular degeneration: Secondary | ICD-10-CM | POA: Diagnosis not present

## 2019-12-29 DIAGNOSIS — R2681 Unsteadiness on feet: Secondary | ICD-10-CM | POA: Diagnosis not present

## 2019-12-29 DIAGNOSIS — I5030 Unspecified diastolic (congestive) heart failure: Secondary | ICD-10-CM | POA: Diagnosis not present

## 2019-12-29 NOTE — Telephone Encounter (Signed)
Rep states that pain is experiencing hip pain and patient is not taking any medication. Would like to know what could the patient take to reduce the pain.

## 2019-12-29 NOTE — Telephone Encounter (Signed)
Please schedule pt for virtual or OV to discuss pain. SDA is fine.

## 2020-01-02 ENCOUNTER — Ambulatory Visit (INDEPENDENT_AMBULATORY_CARE_PROVIDER_SITE_OTHER): Payer: Medicare Other | Admitting: Family Medicine

## 2020-01-02 ENCOUNTER — Other Ambulatory Visit: Payer: Self-pay

## 2020-01-02 ENCOUNTER — Encounter: Payer: Self-pay | Admitting: Family Medicine

## 2020-01-02 VITALS — BP 118/76 | HR 61 | Temp 98.9°F | Ht 69.0 in | Wt 137.2 lb

## 2020-01-02 DIAGNOSIS — N183 Chronic kidney disease, stage 3 unspecified: Secondary | ICD-10-CM

## 2020-01-02 DIAGNOSIS — F324 Major depressive disorder, single episode, in partial remission: Secondary | ICD-10-CM

## 2020-01-02 DIAGNOSIS — I5189 Other ill-defined heart diseases: Secondary | ICD-10-CM

## 2020-01-02 DIAGNOSIS — I10 Essential (primary) hypertension: Secondary | ICD-10-CM

## 2020-01-02 MED ORDER — SERTRALINE HCL 100 MG PO TABS: 100.0000 mg | ORAL_TABLET | Freq: Every day | ORAL | 3 refills | Status: DC

## 2020-01-02 NOTE — Progress Notes (Signed)
Phone [phone] In person visit   Subjective:   Roger [name] is a 86 y.o. year old very pleasant male patient who presents for/with See problem oriented charting Chief Complaint  Patient presents with  . Hip Pain   This visit occurred during the SARS-CoV-2 public health emergency.  Safety protocols were in place, including screening questions prior to the visit, additional usage of staff PPE, and extensive cleaning of exam room while observing appropriate contact time as indicated for disinfecting solutions.   Past Medical History-  Patient Active Problem List   Diagnosis Date Noted  . Ascending aortic aneurysm ([place]) 10/17/2017    Priority: High  . Diastolic dysfunction Q000111Q    Priority: High  . Overactive bladder 07/13/2014    Priority: High  . Allergic conjunctivitis of both eyes 06/22/2017    Priority: Medium  . Depression, major, single episode, complete remission ([place]) 07/05/2015    Priority: Medium  . History of pneumonia 03/10/2015    Priority: Medium  . Chronic cough 10/11/2014    Priority: Medium  . CKD (chronic kidney disease), stage III ([place]) 07/13/2014    Priority: Medium  . Syncope 11/17/2013    Priority: Medium  . Hypertension 06/17/2007    Priority: Medium  . Hyperlipidemia 02/22/2007    Priority: Medium  . Aortic atherosclerosis ([place]) 10/17/2017    Priority: Low  . Bronchitis 08/29/2017    Priority: Low  . Venous (peripheral) insufficiency 01/26/2015    Priority: Low  . Macular degeneration 06/19/2011    Priority: Low  . Osteoarthritis 06/05/2009    Priority: Low  . CARCINOMA, SKIN, SQUAMOUS CELL 05/26/2008    Priority: Low  . GERD (gastroesophageal reflux disease) 02/22/2007    Priority: Low  . Right hip pain 09/13/2018  . Laceration of scalp 10/17/2017  . Onychomycosis 10/22/2016    Medications- reviewed and updated Current Outpatient Medications  Medication Sig Dispense Refill  . benzonatate (TESSALON) 100 MG capsule TAKE 1  CAPSULE (100 MG TOTAL) BY MOUTH 2 (TWO) TIMES DAILY AS NEEDED FOR COUGH. 30 capsule 0  . furosemide (LASIX) 20 MG tablet Take 1 tablet (20 mg total) by mouth daily as needed (weight gain or increased leg swelling). 90 tablet 1  . lovastatin (MEVACOR) 40 MG tablet Take 1 tablet (40 mg total) by mouth daily. 90 tablet 1  . Multiple Vitamins-Minerals (MULTIVITAMIN WITH MINERALS) tablet Take 1 tablet by mouth daily.    . mupirocin ointment (BACTROBAN) 2 % Apply 1 application topically 2 (two) times daily. 30 g 2  . sertraline (ZOLOFT) 100 MG tablet Take 1 tablet (100 mg total) by mouth daily. 90 tablet 3  . UNABLE TO FIND Med Name: Med pass 120 mL by mouth twice daily for supplement     No current facility-administered medications for this visit.     Objective:  BP 118/76   Pulse 61   Temp 98.9 F (37.2 C) (Temporal)   Ht 5\' 9"  (1.753 m)   Wt 137 lb 3.2 oz (62.2 kg)   SpO2 100%   BMI 20.26 kg/m  Gen: NAD, resting comfortably CV: RRR no murmurs rubs or gallops Lungs: CTAB no crackles, wheeze, rhonchi Ext: no edema Skin: warm, dry Neuro: Wheelchair-bound, hard of hearing    Assessment and Plan   # Depression S: Medication:Zoloft 75 mg  Patiet's daughter mentioned that she would like his Zoloft increased.  Patient did have 1 day where he went into the restroom and stated that he wanted to end  it before going on the restroom-he has not previously made comments similar to this-he admits to being down a little bit more than usual Depression screen [facility] 2/9 01/02/2020 12/01/2019 09/01/2019  Decreased Interest 3 0 0  Down, Depressed, Hopeless 3 1 0  PHQ - 2 Score 6 1 0  Altered sleeping 0 0 0  Tired, decreased energy 1 0 0  Change in appetite 0 0 0  Feeling bad or failure about yourself  0 0 0  Trouble concentrating 0 0 0  Moving slowly or fidgety/restless 0 0 0  Suicidal thoughts 1 0 0  PHQ-9 Score 8 1 0  Difficult doing work/chores Somewhat difficult Not difficult at all -  Some  recent data might be hidden  A/P: Depression only in partial remission-increase Zoloft to 100 mg from 75 mg.  Follow-up in 6 to 8 weeks.  Patient agrees to let a family member or me know if has any other thoughts that he would be better off dead-he did not have a plan when he made the comments about "ending it"  #Diastolic dysfunction #Overactive bladder/urinary retention with medication in the past S: Patient without history of hospitalization for fluid overload.  He does have chronic kidney disease stage III and venous insufficiency which likely contributes to edema.  Patient felt like being off Lasix daily and transition to as needed was helpful for overactive bladder symptoms but when he tried off the medication on a more regular basis he did not notice significant improvement in urinary frequency.  He is currently using Lasix 2 out of every 3 days and has not noted increased edema or weight gain  Oxybutynin is not a good option due to urinary retention history while on medication even 2.5 mg A/P: Diastolic dysfunction-seems stable with Lasix 2 out of every 3 days.  Did encourage him to use compression stockings.  Unfortunately this did not improve his overactive bladder symptoms. -Please note weight appears to be down 10 pounds from last visit but patient states that was actually at home weight reading so these are on different scales.  For overactive bladder-I reviewed with him history of urinary retention symptoms on oxybutynin-I told him I was concerned for similar with oxybutynin or Myrbetriq-ultimately he agrees to remain off medication.  We discussed using adult diapers such as dependz-he still seems rather hesitant   #hypertension S: medication: Well controlled despite removing Lasix 3 days a week BP Readings from Last 3 Encounters:  01/02/20 118/76  12/01/19 104/60  09/01/19 130/86  A/P:  Stable. Continue current medications.     # allergies #Difficulty with phlegm S:globs of  phlegm have been much better on xyzal lately. Sugar tends to increase amount of phlegm so he has cut down on his sugar.   A/P: It continues to seem like allergies are likely contributing to his issues with phlegm in his throat-improvement on Xyzal recently.  He would like to loosen the sputum up further-unfortunately it appears when he used Mucinex in the past it actually thickened his secretions-may be due to overall fluid intake not being excellent plus being on Lasix. -For now we will continue current medication  #CKD stage III-stable on last labs.  Update CMP today with ongoing Lasix use..  Patient should avoid NSAIDs GFR has been in the 50s  Recommended follow up: Return in about 2 months (around 03/03/2020). Future Appointments  Date Time Provider [place]  01/24/2020  1:45 PM Trula Slade, D[facility]  02/10/2020 11:20  AM [name], MD [facility]    Lab/Order associations:   ICD-10-CM   1. Essential hypertension  I10 CBC with Differential/Platelet    Comprehensive metabolic panel  2. Depression, major, single episode, in partial remission ([place])  F32.4   3. Diastolic dysfunction  123XX123   4. Stage 3 chronic kidney disease, unspecified whether stage 3a or 3b CKD  N18.30     Meds ordered this encounter  Medications  . sertraline (ZOLOFT) 100 MG tablet    Sig: Take 1 tablet (100 mg total) by mouth daily.    Dispense:  90 tablet    Refill:  3    Time Spent: 37 minutes of total time (2:23 PM- 3:00 PM) was spent on the date of the encounter performing the following actions: chart review prior to seeing the patient, obtaining history, performing a medically necessary exam, counseling on the treatment plan, placing orders, and documenting in our EHR.   Return precautions advised.  Garret Reddish, MD

## 2020-01-02 NOTE — Patient Instructions (Addendum)
Please stop by lab before you go If you have mychart- we will send your results within 3 business days of [place] receiving them.  If you do not have mychart- we will call you about results within 5 business days of [place] receiving them.   Stay on current dose of Lasix taking 2 days in a row then a day off then repeating  I increased her Zoloft to 100 mg.  Lets follow-up in 6 to 8 weeks to see how you are doing with this change.  If you have thoughts of self-harm that increase please contact us immediately

## 2020-01-03 DIAGNOSIS — L03116 Cellulitis of left lower limb: Secondary | ICD-10-CM | POA: Diagnosis not present

## 2020-01-03 DIAGNOSIS — E785 Hyperlipidemia, unspecified: Secondary | ICD-10-CM | POA: Diagnosis not present

## 2020-01-03 DIAGNOSIS — R2681 Unsteadiness on feet: Secondary | ICD-10-CM | POA: Diagnosis not present

## 2020-01-03 DIAGNOSIS — N183 Chronic kidney disease, stage 3 unspecified: Secondary | ICD-10-CM | POA: Diagnosis not present

## 2020-01-03 DIAGNOSIS — I872 Venous insufficiency (chronic) (peripheral): Secondary | ICD-10-CM | POA: Diagnosis not present

## 2020-01-03 DIAGNOSIS — H3530 Unspecified macular degeneration: Secondary | ICD-10-CM | POA: Diagnosis not present

## 2020-01-03 DIAGNOSIS — I5030 Unspecified diastolic (congestive) heart failure: Secondary | ICD-10-CM | POA: Diagnosis not present

## 2020-01-03 DIAGNOSIS — M1990 Unspecified osteoarthritis, unspecified site: Secondary | ICD-10-CM | POA: Diagnosis not present

## 2020-01-03 DIAGNOSIS — L84 Corns and callosities: Secondary | ICD-10-CM | POA: Diagnosis not present

## 2020-01-03 DIAGNOSIS — I130 Hypertensive heart and chronic kidney disease with heart failure and stage 1 through stage 4 chronic kidney disease, or unspecified chronic kidney disease: Secondary | ICD-10-CM | POA: Diagnosis not present

## 2020-01-03 DIAGNOSIS — Z9181 History of falling: Secondary | ICD-10-CM | POA: Diagnosis not present

## 2020-01-03 LAB — COMPREHENSIVE METABOLIC PANEL
ALT: 17 U/L (ref 0–53)
AST: 23 U/L (ref 0–37)
Albumin: 3.7 g/dL (ref 3.5–5.2)
Alkaline Phosphatase: 85 U/L (ref 39–117)
BUN: 21 mg/dL (ref 6–23)
CO2: 30 mEq/L (ref 19–32)
Calcium: 8.7 mg/dL (ref 8.4–10.5)
Chloride: 103 mEq/L (ref 96–112)
Creatinine, Ser: 1.16 mg/dL (ref 0.40–1.50)
GFR: 58.1 mL/min — ABNORMAL LOW (ref >60.00)
Glucose, Bld: 95 mg/dL (ref 70–99)
Potassium: 4.3 mEq/L (ref 3.5–5.1)
Sodium: 140 mEq/L (ref 135–145)
Total Bilirubin: 0.4 mg/dL (ref 0.2–1.2)
Total Protein: 6.3 g/dL (ref 6.0–8.3)

## 2020-01-03 LAB — CBC WITH DIFFERENTIAL/PLATELET
Basophils Absolute: 0.1 10*3/uL (ref 0.0–0.1)
Basophils Relative: 1.5 % (ref 0.0–3.0)
Eosinophils Absolute: 0.2 10*3/uL (ref 0.0–0.7)
Eosinophils Relative: 3.5 % (ref 0.0–5.0)
HCT: 31.6 % — ABNORMAL LOW (ref 39.0–52.0)
Hemoglobin: 10.7 g/dL — ABNORMAL LOW (ref 13.0–17.0)
Lymphocytes Relative: 25.1 % (ref 12.0–46.0)
Lymphs Abs: 1.5 10*3/uL (ref 0.7–4.0)
MCHC: 33.9 g/dL (ref 30.0–36.0)
MCV: 94.6 fl (ref 78.0–100.0)
Monocytes Absolute: 0.6 10*3/uL (ref 0.1–1.0)
Monocytes Relative: 9.6 % (ref 3.0–12.0)
Neutro Abs: 3.5 10*3/uL (ref 1.4–7.7)
Neutrophils Relative %: 60.3 % (ref 43.0–77.0)
Platelets: 264 10*3/uL (ref 150.0–400.0)
RBC: 3.34 Mil/uL — ABNORMAL LOW (ref 4.22–5.81)
RDW: 14.7 % (ref 11.5–15.5)
WBC: 5.8 10*3/uL (ref 4.0–10.5)

## 2020-01-05 DIAGNOSIS — N183 Chronic kidney disease, stage 3 unspecified: Secondary | ICD-10-CM | POA: Diagnosis not present

## 2020-01-05 DIAGNOSIS — E785 Hyperlipidemia, unspecified: Secondary | ICD-10-CM | POA: Diagnosis not present

## 2020-01-05 DIAGNOSIS — I130 Hypertensive heart and chronic kidney disease with heart failure and stage 1 through stage 4 chronic kidney disease, or unspecified chronic kidney disease: Secondary | ICD-10-CM | POA: Diagnosis not present

## 2020-01-05 DIAGNOSIS — I5030 Unspecified diastolic (congestive) heart failure: Secondary | ICD-10-CM | POA: Diagnosis not present

## 2020-01-05 DIAGNOSIS — M1990 Unspecified osteoarthritis, unspecified site: Secondary | ICD-10-CM | POA: Diagnosis not present

## 2020-01-05 DIAGNOSIS — I872 Venous insufficiency (chronic) (peripheral): Secondary | ICD-10-CM | POA: Diagnosis not present

## 2020-01-05 DIAGNOSIS — Z9181 History of falling: Secondary | ICD-10-CM | POA: Diagnosis not present

## 2020-01-05 DIAGNOSIS — R2681 Unsteadiness on feet: Secondary | ICD-10-CM | POA: Diagnosis not present

## 2020-01-05 DIAGNOSIS — H3530 Unspecified macular degeneration: Secondary | ICD-10-CM | POA: Diagnosis not present

## 2020-01-05 DIAGNOSIS — L84 Corns and callosities: Secondary | ICD-10-CM | POA: Diagnosis not present

## 2020-01-05 DIAGNOSIS — L03116 Cellulitis of left lower limb: Secondary | ICD-10-CM | POA: Diagnosis not present

## 2020-01-09 DIAGNOSIS — Z9181 History of falling: Secondary | ICD-10-CM | POA: Diagnosis not present

## 2020-01-09 DIAGNOSIS — I872 Venous insufficiency (chronic) (peripheral): Secondary | ICD-10-CM | POA: Diagnosis not present

## 2020-01-09 DIAGNOSIS — R2681 Unsteadiness on feet: Secondary | ICD-10-CM | POA: Diagnosis not present

## 2020-01-09 DIAGNOSIS — M1990 Unspecified osteoarthritis, unspecified site: Secondary | ICD-10-CM | POA: Diagnosis not present

## 2020-01-09 DIAGNOSIS — N183 Chronic kidney disease, stage 3 unspecified: Secondary | ICD-10-CM | POA: Diagnosis not present

## 2020-01-09 DIAGNOSIS — L84 Corns and callosities: Secondary | ICD-10-CM | POA: Diagnosis not present

## 2020-01-09 DIAGNOSIS — L03116 Cellulitis of left lower limb: Secondary | ICD-10-CM | POA: Diagnosis not present

## 2020-01-09 DIAGNOSIS — E785 Hyperlipidemia, unspecified: Secondary | ICD-10-CM | POA: Diagnosis not present

## 2020-01-09 DIAGNOSIS — H3530 Unspecified macular degeneration: Secondary | ICD-10-CM | POA: Diagnosis not present

## 2020-01-09 DIAGNOSIS — I5030 Unspecified diastolic (congestive) heart failure: Secondary | ICD-10-CM | POA: Diagnosis not present

## 2020-01-09 DIAGNOSIS — I130 Hypertensive heart and chronic kidney disease with heart failure and stage 1 through stage 4 chronic kidney disease, or unspecified chronic kidney disease: Secondary | ICD-10-CM | POA: Diagnosis not present

## 2020-01-11 DIAGNOSIS — I872 Venous insufficiency (chronic) (peripheral): Secondary | ICD-10-CM | POA: Diagnosis not present

## 2020-01-11 DIAGNOSIS — L84 Corns and callosities: Secondary | ICD-10-CM | POA: Diagnosis not present

## 2020-01-11 DIAGNOSIS — I5030 Unspecified diastolic (congestive) heart failure: Secondary | ICD-10-CM | POA: Diagnosis not present

## 2020-01-11 DIAGNOSIS — Z9181 History of falling: Secondary | ICD-10-CM | POA: Diagnosis not present

## 2020-01-11 DIAGNOSIS — L03116 Cellulitis of left lower limb: Secondary | ICD-10-CM | POA: Diagnosis not present

## 2020-01-11 DIAGNOSIS — R2681 Unsteadiness on feet: Secondary | ICD-10-CM | POA: Diagnosis not present

## 2020-01-11 DIAGNOSIS — I130 Hypertensive heart and chronic kidney disease with heart failure and stage 1 through stage 4 chronic kidney disease, or unspecified chronic kidney disease: Secondary | ICD-10-CM | POA: Diagnosis not present

## 2020-01-11 DIAGNOSIS — M1990 Unspecified osteoarthritis, unspecified site: Secondary | ICD-10-CM | POA: Diagnosis not present

## 2020-01-11 DIAGNOSIS — H3530 Unspecified macular degeneration: Secondary | ICD-10-CM | POA: Diagnosis not present

## 2020-01-11 DIAGNOSIS — N183 Chronic kidney disease, stage 3 unspecified: Secondary | ICD-10-CM | POA: Diagnosis not present

## 2020-01-11 DIAGNOSIS — E785 Hyperlipidemia, unspecified: Secondary | ICD-10-CM | POA: Diagnosis not present

## 2020-01-15 ENCOUNTER — Other Ambulatory Visit: Payer: Self-pay | Admitting: Family Medicine

## 2020-01-17 DIAGNOSIS — I130 Hypertensive heart and chronic kidney disease with heart failure and stage 1 through stage 4 chronic kidney disease, or unspecified chronic kidney disease: Secondary | ICD-10-CM | POA: Diagnosis not present

## 2020-01-17 DIAGNOSIS — I872 Venous insufficiency (chronic) (peripheral): Secondary | ICD-10-CM | POA: Diagnosis not present

## 2020-01-17 DIAGNOSIS — L84 Corns and callosities: Secondary | ICD-10-CM | POA: Diagnosis not present

## 2020-01-17 DIAGNOSIS — E785 Hyperlipidemia, unspecified: Secondary | ICD-10-CM | POA: Diagnosis not present

## 2020-01-17 DIAGNOSIS — M1990 Unspecified osteoarthritis, unspecified site: Secondary | ICD-10-CM | POA: Diagnosis not present

## 2020-01-17 DIAGNOSIS — Z9181 History of falling: Secondary | ICD-10-CM | POA: Diagnosis not present

## 2020-01-17 DIAGNOSIS — I5030 Unspecified diastolic (congestive) heart failure: Secondary | ICD-10-CM | POA: Diagnosis not present

## 2020-01-17 DIAGNOSIS — N183 Chronic kidney disease, stage 3 unspecified: Secondary | ICD-10-CM | POA: Diagnosis not present

## 2020-01-17 DIAGNOSIS — R2681 Unsteadiness on feet: Secondary | ICD-10-CM | POA: Diagnosis not present

## 2020-01-17 DIAGNOSIS — H3530 Unspecified macular degeneration: Secondary | ICD-10-CM | POA: Diagnosis not present

## 2020-01-17 DIAGNOSIS — L03116 Cellulitis of left lower limb: Secondary | ICD-10-CM | POA: Diagnosis not present

## 2020-01-20 ENCOUNTER — Ambulatory Visit: Payer: Medicare Other | Admitting: Family Medicine

## 2020-01-24 ENCOUNTER — Encounter: Payer: Self-pay | Admitting: Podiatry

## 2020-01-24 ENCOUNTER — Ambulatory Visit: Payer: Medicare Other | Admitting: Podiatry

## 2020-01-24 ENCOUNTER — Other Ambulatory Visit: Payer: Self-pay

## 2020-01-24 DIAGNOSIS — L03116 Cellulitis of left lower limb: Secondary | ICD-10-CM

## 2020-01-24 DIAGNOSIS — M7989 Other specified soft tissue disorders: Secondary | ICD-10-CM

## 2020-01-24 DIAGNOSIS — B351 Tinea unguium: Secondary | ICD-10-CM | POA: Diagnosis not present

## 2020-01-24 DIAGNOSIS — M79675 Pain in left toe(s): Secondary | ICD-10-CM

## 2020-01-24 DIAGNOSIS — M79674 Pain in right toe(s): Secondary | ICD-10-CM | POA: Diagnosis not present

## 2020-01-26 DIAGNOSIS — L03116 Cellulitis of left lower limb: Secondary | ICD-10-CM | POA: Diagnosis not present

## 2020-01-26 DIAGNOSIS — I130 Hypertensive heart and chronic kidney disease with heart failure and stage 1 through stage 4 chronic kidney disease, or unspecified chronic kidney disease: Secondary | ICD-10-CM | POA: Diagnosis not present

## 2020-01-26 DIAGNOSIS — N183 Chronic kidney disease, stage 3 unspecified: Secondary | ICD-10-CM | POA: Diagnosis not present

## 2020-01-26 DIAGNOSIS — H3530 Unspecified macular degeneration: Secondary | ICD-10-CM | POA: Diagnosis not present

## 2020-01-26 DIAGNOSIS — M1990 Unspecified osteoarthritis, unspecified site: Secondary | ICD-10-CM | POA: Diagnosis not present

## 2020-01-26 DIAGNOSIS — I872 Venous insufficiency (chronic) (peripheral): Secondary | ICD-10-CM | POA: Diagnosis not present

## 2020-01-26 DIAGNOSIS — L84 Corns and callosities: Secondary | ICD-10-CM | POA: Diagnosis not present

## 2020-01-26 DIAGNOSIS — E785 Hyperlipidemia, unspecified: Secondary | ICD-10-CM | POA: Diagnosis not present

## 2020-01-26 DIAGNOSIS — Z9181 History of falling: Secondary | ICD-10-CM | POA: Diagnosis not present

## 2020-01-26 DIAGNOSIS — I5030 Unspecified diastolic (congestive) heart failure: Secondary | ICD-10-CM | POA: Diagnosis not present

## 2020-01-26 DIAGNOSIS — R2681 Unsteadiness on feet: Secondary | ICD-10-CM | POA: Diagnosis not present

## 2020-02-01 NOTE — Progress Notes (Signed)
Subjective: 86 year old male presents the office today for follow-up evaluation of a wound, infection to the left second toe as well as redness and swelling to the left leg.  States that the infection is doing much better he has not had any redness and denies any drainage or pus or any red streaks.  Asking for the nails to be trimmed today's are thickened elongated.  He has no other concerns today. Denies any fevers, chills, nausea, vomiting.  No calf pain, chest pain, shortness of breath.  Objective: AAO x3, NAD Hyperkeratotic tissue is present in the left second toe has resolved and there is no edema, erythema to the toe.  There is no erythema or warmth to the foot or leg and no obvious signs of infection noted today. Nails are hypertrophic, dystrophic, brittle, discolored, elongated 10. No surrounding redness or drainage. Tenderness nails 1-5 bilaterally. No open lesions or pre-ulcerative lesions are identified today. No pain with calf compression, swelling, warmth, erythema  Assessment: Resolved infection; symptomatic onychomycosis  Plan: -All treatment options discussed with the patient including all alternatives, risks, complications. -Nails debrided x2 without any complications or bleeding.  Infection appears to be resolved at this time to monitor for any reoccurrence.  Continue with compression, elevation.  No follow-ups on file.  Trula Slade DPM

## 2020-02-10 ENCOUNTER — Ambulatory Visit: Payer: Medicare Other | Admitting: Family Medicine

## 2020-02-21 ENCOUNTER — Ambulatory Visit (INDEPENDENT_AMBULATORY_CARE_PROVIDER_SITE_OTHER): Payer: Medicare Other | Admitting: Family Medicine

## 2020-02-21 ENCOUNTER — Other Ambulatory Visit: Payer: Self-pay

## 2020-02-21 ENCOUNTER — Encounter: Payer: Self-pay | Admitting: Family Medicine

## 2020-02-21 VITALS — BP 110/74 | HR 72 | Temp 97.6°F | Ht 69.0 in | Wt 140.0 lb

## 2020-02-21 DIAGNOSIS — E785 Hyperlipidemia, unspecified: Secondary | ICD-10-CM

## 2020-02-21 DIAGNOSIS — N3281 Overactive bladder: Secondary | ICD-10-CM

## 2020-02-21 DIAGNOSIS — R053 Chronic cough: Secondary | ICD-10-CM

## 2020-02-21 DIAGNOSIS — R05 Cough: Secondary | ICD-10-CM

## 2020-02-21 DIAGNOSIS — I10 Essential (primary) hypertension: Secondary | ICD-10-CM | POA: Diagnosis not present

## 2020-02-21 NOTE — Patient Instructions (Addendum)
Blood pressure is well controlled with as needed Lasix-continue to monitor  Cholesterol well controlled on last check-not quite due for repeat today-recheck at next visit in 3 months.  Continue lovastatin  Knee pain likely related to arthritis-recommended Voltaren gel over-the-counter  wax removed from both ears-looks much better now  ongoing issues with incontinence.  Strongly recommended adult diapers.  Medications like Myrbetriq or oxybutynin may cause urinary retention issues like you have had in the past-we also discussed possible urology referral but I am not sure what else we will be able to offer as I think staying off medicine is your best bet  ongoing issues with chronic cough.  He feels better when he avoids foods like bread, bananas, milk, tomatoes-encouraged him to avoid these things.  Could refer back to pulmonology Dr. Melvyn Novas if needed

## 2020-02-21 NOTE — Progress Notes (Deleted)
duplicate

## 2020-02-21 NOTE — Progress Notes (Signed)
Phone [phone] In person visit   Subjective:   Roger [name] is a 86 y.o. year old very pleasant male patient who presents for/with See problem oriented charting Chief Complaint  Patient presents with  . Follow-up  . Hypertension  . Hyperlipidemia    This visit occurred during the SARS-CoV-2 public health emergency.  Safety protocols were in place, including screening questions prior to the visit, additional usage of staff PPE, and extensive cleaning of exam room while observing appropriate contact time as indicated for disinfecting solutions.   Past Medical History-  Patient Active Problem List   Diagnosis Date Noted  . Ascending aortic aneurysm ([place]) 10/17/2017    Priority: High  . Diastolic dysfunction 02/54/2706    Priority: High  . Overactive bladder 07/13/2014    Priority: High  . Allergic conjunctivitis of both eyes 06/22/2017    Priority: Medium  . Depression, major, single episode, complete remission ([facility]) 07/05/2015    Priority: Medium  . History of pneumonia 03/10/2015    Priority: Medium  . Chronic cough 10/11/2014    Priority: Medium  . CKD (chronic kidney disease), stage III ([place]) 07/13/2014    Priority: Medium  . Syncope 11/17/2013    Priority: Medium  . Hypertension 06/17/2007    Priority: Medium  . Hyperlipidemia 02/22/2007    Priority: Medium  . Aortic atherosclerosis ([place]) 10/17/2017    Priority: Low  . Bronchitis 08/29/2017    Priority: Low  . Venous (peripheral) insufficiency 01/26/2015    Priority: Low  . Macular degeneration 06/19/2011    Priority: Low  . Osteoarthritis 06/05/2009    Priority: Low  . CARCINOMA, SKIN, SQUAMOUS CELL 05/26/2008    Priority: Low  . GERD (gastroesophageal reflux disease) 02/22/2007    Priority: Low  . Right hip pain 09/13/2018  . Laceration of scalp 10/17/2017  . Onychomycosis 10/22/2016    Medications- reviewed and updated Current Outpatient Medications  Medication Sig Dispense Refill  .  benzonatate (TESSALON) 100 MG capsule TAKE 1 CAPSULE (100 MG TOTAL) BY MOUTH 2 (TWO) TIMES DAILY AS NEEDED FOR COUGH. 30 capsule 0  . furosemide (LASIX) 20 MG tablet Take 1 tablet (20 mg total) by mouth daily as needed (weight gain or increased leg swelling). 90 tablet 1  . lovastatin (MEVACOR) 40 MG tablet TAKE 1 TABLET BY MOUTH  DAILY 90 tablet 3  . Multiple Vitamins-Minerals (MULTIVITAMIN WITH MINERALS) tablet Take 1 tablet by mouth daily.    . mupirocin ointment (BACTROBAN) 2 % Apply 1 application topically 2 (two) times daily. 30 g 2  . sertraline (ZOLOFT) 100 MG tablet Take 1 tablet (100 mg total) by mouth daily. 90 tablet 3  . UNABLE TO FIND Med Name: Med pass 120 mL by mouth twice daily for supplement     No current facility-administered medications for this visit.     Objective:  BP 110/74   Pulse 72   Temp 97.6 F (36.4 C) (Temporal)   Ht 5\' 9"  (1.753 m)   Wt 140 lb (63.5 kg)   SpO2 95%   BMI 20.67 kg/m  Gen: NAD, resting comfortably Cerumen impaction removed bilaterally-normal tympanic membrane beyodthis. CV: RRR no murmurs rubs or gallops Lungs: CTAB no crackles, wheeze, rhonchi In wheelchair    Assessment and Plan   #hypertension S: medication: lasix only as needed Home readings #s: Home readings have been good. Does not know what they are.  BP Readings from Last 3 Encounters:  02/21/20 110/74  01/02/20 118/76  12/01/19  104/60  A/P: Blood pressure is well controlled with as needed Lasix-continue to monitor  #hyperlipidemia S: Medication: Lovastatin  Lab Results  Component Value Date   CHOL 137 05/09/2019   HDL 52.00 05/09/2019   LDLCALC 61 05/09/2019   LDLDIRECT 77.0 10/16/2016   TRIG 118.0 05/09/2019   CHOLHDL 3 05/09/2019   A/P: Cholesterol well controlled on last check-not quite due for repeat today-recheck at next visit in 3 months.  Continue lovastatin  #Knee pain likely related to arthritis-recommended Voltaren gel over-the-counter  #Cerumen  impaction-wax removed from both ears-looks much better now  #Overactive bladder/incontinence/suspect BPH- ongoing issues with incontinence/sudden feeling of urgency.  Strongly recommended adult diapers.  Medications like Myrbetriq or oxybutynin may cause urinary retention issues like you have had in the past-we also discussed possible urology referral but I am not sure what else we will be able to offer as I think staying off medicine is your best bet  #Chronic cough-ongoing issues with chronic cough. Prior referral to Dr. Melvyn Novas pulmonary but he does not want to return at this time. Tessalon helps some.  He feels better when he avoids foods like bread, bananas, milk, tomatoes-encouraged him to avoid these things.  Could refer back to pulmonology Dr. Melvyn Novas if needed  Recommended follow up: Return in about 3 months (around 05/23/2020) for physical or sooner if needed. Future Appointments  Date Time Provider [place]  09/27/2020  1:00 PM Rankin, Clent Demark, MD RDE-RDE None    Lab/Order associations:   ICD-10-CM   1. Essential hypertension  I10   2. Hyperlipidemia, unspecified hyperlipidemia type  E78.5   3. Overactive bladder  N32.81   4. Chronic cough  R05     Return precautions advised.  Garret Reddish, MD

## 2020-03-08 ENCOUNTER — Encounter: Payer: Self-pay | Admitting: Internal Medicine

## 2020-03-08 ENCOUNTER — Ambulatory Visit: Payer: Medicare Other | Admitting: Internal Medicine

## 2020-03-08 ENCOUNTER — Other Ambulatory Visit: Payer: Self-pay

## 2020-03-08 DIAGNOSIS — R05 Cough: Secondary | ICD-10-CM | POA: Diagnosis not present

## 2020-03-08 DIAGNOSIS — R053 Chronic cough: Secondary | ICD-10-CM

## 2020-03-08 MED ORDER — PANTOPRAZOLE SODIUM 40 MG PO TBEC: DELAYED_RELEASE_TABLET | ORAL | 2 refills | Status: DC

## 2020-03-08 NOTE — Patient Instructions (Addendum)
Pantoprazole 40 mg  Take 30- 60 min before your first and last meals of the day   GERD (REFLUX)  is an extremely common cause of respiratory symptoms just like yours , many times with no obvious heartburn at all.    It can be treated with medication, but also with lifestyle changes including elevation of the head of your bed (ideally with 6-8  inch blocks under the headboard of your bed),  Smoking cessation, avoidance of late meals, excessive alcohol, and avoid fatty foods, chocolate, peppermint, colas, red wine, and acidic juices such as orange juice.  NO MINT OR MENTHOL PRODUCTS SO NO COUGH DROPS  USE SUGARLESS CANDY INSTEAD (Jolley ranchers or Stover's or Life Savers) or even ice chips will also do - the key is to swallow to prevent all throat clearing. NO OIL BASED VITAMINS - use powdered substitutes.  Avoid fish oil when coughing.  If cough/ swallowing not improving over the next 2 weeks  then needs modified barium swallow   Pulmonary follow up is as needed

## 2020-03-08 NOTE — Progress Notes (Addendum)
Roger [name], male    DOB: Dec 22, 1919,    MRN: [id number]   Brief patient profile:  86 yowm quit smoking  Quit age 86 I with no resp dz new dx of bronchitis > admit   Admit date: 08/29/2017 Discharge date: 09/01/2017  Admitted From: Home.  Disposition:  Home.   Home Health: yes with oxygen.   Discharge Condition:stable.  CODE STATUS: full code.  Diet recommendation: Heart Healthy  Brief/Interim Summary: Roger Coker Meloniis a 86 y.o.malewith medical history significant ofHypertension, hyperlipidemia, osteoarthritis, GERD, chronic diastolic heart failure, depression, stage 3 CKD. MACULAR degeneration, brought in by his daughter for worsening sob, cough, congestion, since yesterday. On arrival to ED, he was diffusely wheezing, borderline sats on RA requiring 2 to 3 lit of [place] oxygen to keep sats greater than 90%. influenza pcr is negative. He was referred to medical service for admission for bronchospasm, acute bronchitis.    Discharge Diagnoses:  Active Problems:   Hyperlipidemia   Hypertension   GERD (gastroesophageal reflux disease)   Osteoarthritis   CKD (chronic kidney disease), stage III ([facility])   Depression   Diastolic CHF ([facility])   Bronchitis aspiration pneumonia.    Acute respiratory failure with hypoxia secondary to bronchitis and a component of bronchospasm/ aspiration pneumonia.   Admitted for steroids, duo nebs, azithromycin for bronchitis. Influenza PCR is negative Nasal cannula oxygen to keep sats greater than 90%. PT evaluation. He continues to require 4 lit of [place] oxygen on ambulation.  CT chest ordered, showed pneumonia, possibly aspiration.  Added rocephin, and ordere SLP eval.  SLP evaL, MBS done and diet changed.  Discussed the results with daughter at bedside. Discharged on oral antibitoic to complete the course of pneumonia.   Discharge Instructions      Discharge Instructions    Diet - low sodium heart healthy   Complete by:  As  directed    Discharge instructions   Complete by:  As directed    Please follow up with PCP in one week and please follow the below diet as per SLP.  Dys3/ground meats/thin Start meal with liquids Follow bite of solid with multiple sips of liquids Dry swallows with liquids Medicine crushed with puree Keep cough/hock strong.   We have stopped the losartan for your renal function , please check with your PCP before restarting the medication.   Increase activity slowly   Complete by:  As directed          Allergies as of 09/01/2017      Reactions   Amlodipine Besylate    REACTION: swelling of feet   Aspirin    REACTION: GI upset can tolerate ecotrin   Hydrocodone    GI upset              Medication List     STOP taking these medications   losartan 100 MG tablet Commonly known as:  COZAAR     TAKE these medications   amoxicillin-clavulanate 875-125 MG tablet Commonly known as:  AUGMENTIN Take 1 tablet by mouth every 12 (twelve) hours for 7 days.   ECOTRIN 325 MG EC tablet Generic drug:  aspirin Take 325 mg by mouth daily.   fluticasone 50 MCG/ACT nasal spray Commonly known as:  FLONASE Place 2 sprays into both nostrils daily.   furosemide 20 MG tablet Commonly known as:  LASIX Take 1 tablet (20 mg total) daily by mouth.   guaiFENesin 600 MG 12 hr tablet Commonly known as:  MUCINEX  Take 2 tablets (1,200 mg total) by mouth 2 (two) times daily.   lovastatin 40 MG tablet Commonly known as:  MEVACOR Take 1 tablet (40 mg total) by mouth daily.   multivitamin with minerals tablet Take 1 tablet by mouth daily.   omeprazole 20 MG capsule Commonly known as:  PRILOSEC TAKE ONE CAPSULE BY MOUTH TWICE A DAY BEFORE A MEAL   oxybutynin 5 MG tablet Commonly known as:  DITROPAN Take 0.5 tablets (2.5 mg total) by mouth daily.   UNABLE TO FIND Med Name: Med pass 120 mL by mouth twice daily for supplement        History of  Present Illness  10/12/2018  Pulmonary/ 1st office eval/Roger [name]  Chief Complaint  Patient presents with  . Pulmonary Consult    Referred by Dr. Rushie [name]. Pt c/o cough off and on for the past several years. Cough is prod with white sputum.    Dyspnea:  Across the room with walker  Cough: cough 2-3 x per week    ENT = Roger [name] f/u ?  Better since stopped  flonase/ and worse p eats with sense of pnds/ globus but min white mucus production Sleep: R side down flat bed never wakes up with cough per care taker/ min in am only and only few times a week SABA use: no  Coughing makes his nose run / zytec made mucus thicker and harder to cough up  Using white life savers  rec Continue prilosec 20 mg Take 30- 60 min before your first and last meals of the day  GERD  Diet  [facility] has a section about swallowing issues you should read  Please see patient coordinator before you leave today  to schedule modified barium swallow  Please remember to go to the  x-ray department  for your tests - we will call you with the results when they are available   Pulmonary follow up is as needed    03/08/2020  f/u ov/Roger [name] re: uacs / fully vaccinated for covid 19  Chief Complaint  Patient presents with  . Follow-up    productive cough with white-grey phlegm, trouble swallowing   Dyspnea:  Bed to w/c falls freq Cough: worse as day goes on  Sleeping: bed is flat SABA use: none 02: none    No obvious day to day or daytime variability or assoc excess/ purulent sputum or mucus plugs or hemoptysis or cp or chest tightness, subjective wheeze or overt sinus or hb symptoms.    Also denies any obvious fluctuation of symptoms with weather or environmental changes or other aggravating or alleviating factors except as outlined above   No unusual exposure hx or h/o childhood pna/ asthma or knowledge of premature birth.  Current Allergies, Complete Past Medical History, Past Surgical History, Family History, and Social  History were reviewed in [employer] record.  ROS  The following are not active complaints unless bolded Hoarseness, sore throat, dysphagia, dental problems, itching, sneezing,  nasal congestion or discharge of excess mucus or purulent secretions, ear ache,   fever, chills, sweats, unintended wt loss or wt gain, classically pleuritic or exertional cp,  orthopnea pnd or arm/hand swelling  or leg swelling, presyncope, palpitations, abdominal pain, anorexia, nausea, vomiting, diarrhea  or change in bowel habits or change in bladder habits, change in stools or change in urine, dysuria, hematuria,  rash, arthralgias, visual complaints, headache, numbness, weakness or ataxia or problems with walking or coordination,  change in mood  or  memory.        Current Meds  Medication Sig  . benzonatate (TESSALON) 100 MG capsule TAKE 1 CAPSULE (100 MG TOTAL) BY MOUTH 2 (TWO) TIMES DAILY AS NEEDED FOR COUGH.  . furosemide (LASIX) 20 MG tablet Take 1 tablet (20 mg total) by mouth daily as needed (weight gain or increased leg swelling).  [name] lovastatin (MEVACOR) 40 MG tablet TAKE 1 TABLET BY MOUTH  DAILY  . Multiple Vitamins-Minerals (MULTIVITAMIN WITH MINERALS) tablet Take 1 tablet by mouth daily.  . mupirocin ointment (BACTROBAN) 2 % Apply 1 application topically 2 (two) times daily.  . sertraline (ZOLOFT) 100 MG tablet Take 1 tablet (100 mg total) by mouth daily.  [name] UNABLE TO FIND Med Name: Med pass 120 mL by mouth twice daily for supplement           Past Medical History:  Diagnosis Date  . GERD 02/22/2007  . HYPERLIPIDEMIA 02/22/2007  . HYPERTENSION 06/17/2007  . MACULAR DEGENERATION 02/22/2007  . OSTEOARTHRITIS, HIP, RIGHT 06/05/2009        Objective:     Wt Readings from Last 3 Encounters:  03/08/20 131 lb 9.6 oz (59.7 kg)  02/21/20 140 lb (63.5 kg)  01/02/20 137 lb 3.2 oz (62.2 kg)     Vital signs reviewed - Note on arrival 03/08/2020  02 sats  94% on RA      Very hoarse  elderly wm extremely hoh     HEENT : pt wearing mask not removed for exam due to covid -19 concerns.    NECK :  without JVD/Nodes/TM/ nl carotid upstrokes bilaterally   LUNGS: no acc muscle use,  Nl contour chest with minimal insp/exp rhonchi  bilaterally without cough on insp or exp maneuvers   CV:  RRR  no s3 or murmur or increase in P2, and  2+ pitting LE  edema   ABD:  soft and nontender with nl inspiratory excursion in the supine position. No bruits or organomegaly appreciated, bowel sounds nl  MS:  Nl gait/ ext warm without deformities, calf tenderness, cyanosis or clubbing No obvious joint restrictions   SKIN: warm and dry without lesions    NEURO:  alert, approp, nl sensorium with  no motor or cerebellar deficits apparent.                  Assessment

## 2020-03-10 ENCOUNTER — Encounter: Payer: Self-pay | Admitting: Internal Medicine

## 2020-03-10 NOTE — Assessment & Plan Note (Signed)
Onset around 2017  -   ENT Dr. Lucia Gaskins. plhlegm thinner after he stopped zyrtec/ worse pnds p started flonase so stopped it -   Pulmonary eval 10/12/2018 reported worse p meals > recMBS 10/13/2018 >>> apparently not done    C/w Upper airway cough syndrome (previously labeled PNDS),  is so named because it's frequently impossible to sort out how much is  CR/sinusitis with freq throat clearing (which can be related to primary GERD)   vs  causing  secondary (" extra esophageal")  GERD from wide swings in gastric pressure that occur with throat clearing, often  promoting self use of mint and menthol lozenges that reduce the lower esophageal sphincter tone and exacerbate the problem further in a cyclical fashion.   These are the same pts (now being labeled as having "irritable larynx syndrome" by some cough centers) who not infrequently have a history of having failed to tolerate ace inhibitors,  dry powder inhalers or biphosphonates or report having atypical/extraesophageal reflux symptoms that don't respond to standard doses of PPI  and are easily confused as having aecopd or asthma flares by even experienced allergists/ pulmonologists (myself included).   rec max rx for gerd then MBS if not improving.   Fm not interested in pursuing peg per care taker         Each maintenance medication was reviewed in detail including emphasizing most importantly the difference between maintenance and prns and under what circumstances the prns are to be triggered using an action plan format where appropriate.  Total time for H and P, chart review, counseling, teaching device and generating customized AVS unique to this office visit / charting = 20 min

## 2020-03-20 ENCOUNTER — Other Ambulatory Visit: Payer: Self-pay | Admitting: Family Medicine

## 2020-03-21 ENCOUNTER — Telehealth: Payer: Self-pay | Admitting: Family Medicine

## 2020-03-21 NOTE — Telephone Encounter (Signed)
Patient is scheduled for Tuesday @ 1 pm.

## 2020-03-21 NOTE — Telephone Encounter (Signed)
You can schedule a virtual visit so this can be discussed.

## 2020-03-21 NOTE — Telephone Encounter (Signed)
Patient's daughter is calling in this afternoon, stating he has had 2 recent falls lately and has been fine but she is curious if he needs to be admitted to a rest home or more therapy. Daughter said in the past he would benefit from therapy services but due to him not continuing the exercises he has become a bit weaker. Does patient nee to make an appointment to discuss or can Dr.Hunter advise anything.

## 2020-03-27 ENCOUNTER — Telehealth (INDEPENDENT_AMBULATORY_CARE_PROVIDER_SITE_OTHER): Payer: Medicare Other | Admitting: Family Medicine

## 2020-03-27 ENCOUNTER — Other Ambulatory Visit: Payer: Self-pay

## 2020-03-27 ENCOUNTER — Encounter: Payer: Self-pay | Admitting: Family Medicine

## 2020-03-27 VITALS — BP 129/73 | HR 69 | Ht 68.0 in | Wt 131.0 lb

## 2020-03-27 DIAGNOSIS — W19XXXA Unspecified fall, initial encounter: Secondary | ICD-10-CM

## 2020-03-27 DIAGNOSIS — R05 Cough: Secondary | ICD-10-CM

## 2020-03-27 DIAGNOSIS — I10 Essential (primary) hypertension: Secondary | ICD-10-CM

## 2020-03-27 DIAGNOSIS — M1711 Unilateral primary osteoarthritis, right knee: Secondary | ICD-10-CM | POA: Diagnosis not present

## 2020-03-27 DIAGNOSIS — R531 Weakness: Secondary | ICD-10-CM | POA: Diagnosis not present

## 2020-03-27 DIAGNOSIS — R053 Chronic cough: Secondary | ICD-10-CM

## 2020-03-27 NOTE — Progress Notes (Signed)
Phone [phone] Virtual visit via Video note   Subjective:  Chief complaint: Chief Complaint  Patient presents with  . Fall    x 2 weeks ago    This visit type was conducted due to national recommendations for restrictions regarding the COVID-19 Pandemic (e.g. social distancing).  This format is felt to be most appropriate for this patient at this time balancing risks to patient and risks to population by having him in for in person visit.  No physical exam was performed (except for noted visual exam or audio findings with Telehealth visits).    Our team/I connected with Gwen Her Blanke at  1:00 PM EDT by a video enabled telemedicine application (doxy.me or caregility through epic) and verified that I am speaking with the correct person using two identifiers.  Location patient: Home-[place] Location provider: [facility], office Persons participating in the virtual visit:  Patient, wife, caregiver  Our team/I discussed the limitations of evaluation and management by telemedicine and the availability of in person appointments. In light of current covid-19 pandemic, patient also understands that we are trying to protect them by minimizing in office contact if at all possible.  The patient expressed consent for telemedicine visit and agreed to proceed. Patient understands insurance will be billed.   Past Medical History-  Patient Active Problem List   Diagnosis Date Noted  . Ascending aortic aneurysm ([place]) 10/17/2017    Priority: High  . Diastolic dysfunction 92/33/0076    Priority: High  . Overactive bladder 07/13/2014    Priority: High  . Allergic conjunctivitis of both eyes 06/22/2017    Priority: Medium  . Depression, major, single episode, complete remission ([place]) 07/05/2015    Priority: Medium  . History of pneumonia 03/10/2015    Priority: Medium  . Chronic cough 10/11/2014    Priority: Medium  . CKD (chronic kidney disease), stage III ([place]) 07/13/2014    Priority: Medium  .  Syncope 11/17/2013    Priority: Medium  . Hypertension 06/17/2007    Priority: Medium  . Hyperlipidemia 02/22/2007    Priority: Medium  . Aortic atherosclerosis ([place]) 10/17/2017    Priority: Low  . Bronchitis 08/29/2017    Priority: Low  . Venous (peripheral) insufficiency 01/26/2015    Priority: Low  . Macular degeneration 06/19/2011    Priority: Low  . Osteoarthritis 06/05/2009    Priority: Low  . CARCINOMA, SKIN, SQUAMOUS CELL 05/26/2008    Priority: Low  . GERD (gastroesophageal reflux disease) 02/22/2007    Priority: Low  . Right hip pain 09/13/2018  . Laceration of scalp 10/17/2017  . Onychomycosis 10/22/2016    Medications- reviewed and updated Current Outpatient Medications  Medication Sig Dispense Refill  . benzonatate (TESSALON) 100 MG capsule TAKE 1 CAPSULE (100 MG TOTAL) BY MOUTH 2 (TWO) TIMES DAILY AS NEEDED FOR COUGH. 60 capsule 2  . furosemide (LASIX) 20 MG tablet Take 1 tablet (20 mg total) by mouth daily as needed (weight gain or increased leg swelling). 90 tablet 1  . lovastatin (MEVACOR) 40 MG tablet TAKE 1 TABLET BY MOUTH  DAILY 90 tablet 3  . Multiple Vitamins-Minerals (MULTIVITAMIN WITH MINERALS) tablet Take 1 tablet by mouth daily.    . mupirocin ointment (BACTROBAN) 2 % Apply 1 application topically 2 (two) times daily. 30 g 2  . pantoprazole (PROTONIX) 40 MG tablet Take 30- 60 min before your first and last meals of the day 60 tablet 2  . sertraline (ZOLOFT) 100 MG tablet Take 1 tablet (100  mg total) by mouth daily. 90 tablet 3  . UNABLE TO FIND Med Name: Med pass 120 mL by mouth twice daily for supplement     No current facility-administered medications for this visit.     Objective:  BP 129/73   Pulse 69   Ht 5\' 8"  (1.727 m)   Wt 131 lb (59.4 kg)   BMI 19.92 kg/m  self reported vitals Gen: NAD, resting comfortably in his home Lungs: nonlabored, normal respiratory rate  Skin: appears dry, no obvious rash     Assessment and Plan   #  fall/generalized weakness #Knee Pain S:Had fall two weeks ago. Got up turned around and went back to sit back down- brake on his walker wasn't on and slid out form him and slid backwards onto back- not complaining of significant back pain- did have some skin tears int hat area. . Using wife who is 89 to stand up and get pants on- would love to have PT help him get strength up to do this on his own. Trouble getting on and off toilet. Feels like knee may have given out. Did not hit his head. Skin also tender where woman from EMS lifted him up.   Doing better but now has sore/bruising on left inner thigh. Did have some pain but improving  Asks about an injection for his knee pain- has not taken voltaren gel yet (they had forgotten to pick this up)  A/P:  Knee pain likely related to arthritis- recommended voltaren gel trial once again. Can consider injection if needed.   For generalized weakness- thankful pain improving and bruising improving but want to prevent falls with transitions such as standing up to get to toilet. Will refer for physical and occupational therapy  -Prefers liji with [facility] if possible -they have worked with her before  # chronic Cough  S:ongoing with no improvement. He did have trouble swallowing bread over the weekend. Cough is productive and white. Saw Dr. Melvyn Novas in mid July with recommendation for swallow study  Sunday was at a birthday dinner and had some shrimp, soft cracker and felt like Iit was hard to get food down and then after that he had some bigger balls of spit and increased phlegm. Bread usually bothers him. See last note but he has not avoided the foods which are triggers for him.  A/P: Chronic cough-recent visit with pulmonology and they recommended modified barium swallow study-they are going to call back to get this arranged. I attempted to order this today but I was not 100% sure which one lined up with Dr. Gustavus Bryant recommendation  #hypertension S:  medication: None other than as needed Lasix BP Readings from Last 3 Encounters:  03/27/20 129/73  03/08/20 (!) 110/54  02/21/20 110/74  A/P: Reasonable control-continue current medications   Recommended follow up: 2 months follow-up planned Future Appointments  Date Time Provider [place]  04/09/2020  1:45 PM Trula Slade, DPM [facility]  06/08/2020  2:40 PM [name], MD [facility]  09/27/2020  1:00 PM Rankin, Clent Demark, MD [facility] None    Lab/Order associations:   ICD-10-CM   1. Primary osteoarthritis of right knee  M17.11   2. Essential hypertension  I10   3. Generalized weakness  R53.1 Ambulatory referral to Home Health  4. Chronic cough  R05   5. Fall, initial encounter  W19.XXXA Ambulatory referral to [place]   Time Spent: 31 minutes of total time (12:59 PM- 1:30 PM) was  spent on the date of the encounter performing the following actions: chart review prior to seeing the patient, obtaining history, performing a medically necessary exam, counseling on the treatment plan, placing orders, and documenting in our EHR.   Return precautions advised.  Garret Reddish, MD

## 2020-03-27 NOTE — Patient Instructions (Signed)
Health Maintenance Due  Topic Date Due  . INFLUENZA VACCINE will get later in office  03/25/2020      Recommended follow up: No follow-ups on file.

## 2020-03-29 DIAGNOSIS — R05 Cough: Secondary | ICD-10-CM | POA: Diagnosis not present

## 2020-03-29 DIAGNOSIS — R531 Weakness: Secondary | ICD-10-CM | POA: Diagnosis not present

## 2020-03-29 DIAGNOSIS — M1711 Unilateral primary osteoarthritis, right knee: Secondary | ICD-10-CM | POA: Diagnosis not present

## 2020-03-29 DIAGNOSIS — N183 Chronic kidney disease, stage 3 unspecified: Secondary | ICD-10-CM | POA: Diagnosis not present

## 2020-03-29 DIAGNOSIS — I872 Venous insufficiency (chronic) (peripheral): Secondary | ICD-10-CM | POA: Diagnosis not present

## 2020-03-29 DIAGNOSIS — W19XXXA Unspecified fall, initial encounter: Secondary | ICD-10-CM | POA: Diagnosis not present

## 2020-03-29 DIAGNOSIS — I129 Hypertensive chronic kidney disease with stage 1 through stage 4 chronic kidney disease, or unspecified chronic kidney disease: Secondary | ICD-10-CM | POA: Diagnosis not present

## 2020-03-30 ENCOUNTER — Telehealth: Payer: Self-pay | Admitting: Family Medicine

## 2020-03-30 NOTE — Telephone Encounter (Signed)
Almira from [facility] is calling asking verbal orders for see patient for home health next week for 2 week 3, 1 week 1 and patient is requesting a script for wheelchair.

## 2020-03-30 NOTE — Telephone Encounter (Signed)
Called and provided verbal orders to [facility].

## 2020-04-02 DIAGNOSIS — I872 Venous insufficiency (chronic) (peripheral): Secondary | ICD-10-CM | POA: Diagnosis not present

## 2020-04-02 DIAGNOSIS — R531 Weakness: Secondary | ICD-10-CM | POA: Diagnosis not present

## 2020-04-02 DIAGNOSIS — R05 Cough: Secondary | ICD-10-CM | POA: Diagnosis not present

## 2020-04-02 DIAGNOSIS — I129 Hypertensive chronic kidney disease with stage 1 through stage 4 chronic kidney disease, or unspecified chronic kidney disease: Secondary | ICD-10-CM | POA: Diagnosis not present

## 2020-04-02 DIAGNOSIS — N183 Chronic kidney disease, stage 3 unspecified: Secondary | ICD-10-CM | POA: Diagnosis not present

## 2020-04-02 DIAGNOSIS — M1711 Unilateral primary osteoarthritis, right knee: Secondary | ICD-10-CM | POA: Diagnosis not present

## 2020-04-02 DIAGNOSIS — W19XXXA Unspecified fall, initial encounter: Secondary | ICD-10-CM | POA: Diagnosis not present

## 2020-04-04 ENCOUNTER — Telehealth: Payer: Self-pay | Admitting: Family Medicine

## 2020-04-04 DIAGNOSIS — I129 Hypertensive chronic kidney disease with stage 1 through stage 4 chronic kidney disease, or unspecified chronic kidney disease: Secondary | ICD-10-CM | POA: Diagnosis not present

## 2020-04-04 DIAGNOSIS — R531 Weakness: Secondary | ICD-10-CM | POA: Diagnosis not present

## 2020-04-04 DIAGNOSIS — N183 Chronic kidney disease, stage 3 unspecified: Secondary | ICD-10-CM | POA: Diagnosis not present

## 2020-04-04 DIAGNOSIS — M1711 Unilateral primary osteoarthritis, right knee: Secondary | ICD-10-CM | POA: Diagnosis not present

## 2020-04-04 DIAGNOSIS — I872 Venous insufficiency (chronic) (peripheral): Secondary | ICD-10-CM | POA: Diagnosis not present

## 2020-04-04 DIAGNOSIS — R05 Cough: Secondary | ICD-10-CM | POA: Diagnosis not present

## 2020-04-04 DIAGNOSIS — W19XXXA Unspecified fall, initial encounter: Secondary | ICD-10-CM | POA: Diagnosis not present

## 2020-04-04 NOTE — Progress Notes (Signed)
  Chronic Care Management   Note  04/04/2020 Name: Roger [name] MRN: [id number] DOB: 09/21/1919  Roger [name] is a 86 y.o. year old male who is a primary care patient of [name], MD. I reached out to [name] by phone today in response to a referral sent by Roger [name]'s PCP, [name], MD.   [name] was given information about Chronic Care Management services today including:  1. CCM service includes personalized support from designated clinical staff supervised by his physician, including individualized plan of care and coordination with other care providers 2. 24/7 contact phone numbers for assistance for urgent and routine care needs. 3. Service will only be billed when office clinical staff spend 20 minutes or more in a month to coordinate care. 4. Only one practitioner may furnish and bill the service in a calendar month. 5. The patient may stop CCM services at any time (effective at the end of the month) by phone call to the office staff.   Patient agreed to services and verbal consent obtained.   Follow up plan:   [name]

## 2020-04-06 DIAGNOSIS — W19XXXA Unspecified fall, initial encounter: Secondary | ICD-10-CM | POA: Diagnosis not present

## 2020-04-06 DIAGNOSIS — R05 Cough: Secondary | ICD-10-CM | POA: Diagnosis not present

## 2020-04-06 DIAGNOSIS — I129 Hypertensive chronic kidney disease with stage 1 through stage 4 chronic kidney disease, or unspecified chronic kidney disease: Secondary | ICD-10-CM | POA: Diagnosis not present

## 2020-04-06 DIAGNOSIS — M1711 Unilateral primary osteoarthritis, right knee: Secondary | ICD-10-CM | POA: Diagnosis not present

## 2020-04-06 DIAGNOSIS — I872 Venous insufficiency (chronic) (peripheral): Secondary | ICD-10-CM | POA: Diagnosis not present

## 2020-04-06 DIAGNOSIS — R531 Weakness: Secondary | ICD-10-CM | POA: Diagnosis not present

## 2020-04-06 DIAGNOSIS — N183 Chronic kidney disease, stage 3 unspecified: Secondary | ICD-10-CM | POA: Diagnosis not present

## 2020-04-09 ENCOUNTER — Ambulatory Visit: Payer: Medicare Other | Admitting: Podiatry

## 2020-04-09 ENCOUNTER — Other Ambulatory Visit: Payer: Self-pay

## 2020-04-09 DIAGNOSIS — M79675 Pain in left toe(s): Secondary | ICD-10-CM

## 2020-04-09 DIAGNOSIS — M79674 Pain in right toe(s): Secondary | ICD-10-CM

## 2020-04-09 DIAGNOSIS — B351 Tinea unguium: Secondary | ICD-10-CM | POA: Diagnosis not present

## 2020-04-10 DIAGNOSIS — I872 Venous insufficiency (chronic) (peripheral): Secondary | ICD-10-CM | POA: Diagnosis not present

## 2020-04-10 DIAGNOSIS — M1711 Unilateral primary osteoarthritis, right knee: Secondary | ICD-10-CM | POA: Diagnosis not present

## 2020-04-10 DIAGNOSIS — R05 Cough: Secondary | ICD-10-CM | POA: Diagnosis not present

## 2020-04-10 DIAGNOSIS — I129 Hypertensive chronic kidney disease with stage 1 through stage 4 chronic kidney disease, or unspecified chronic kidney disease: Secondary | ICD-10-CM | POA: Diagnosis not present

## 2020-04-10 DIAGNOSIS — N183 Chronic kidney disease, stage 3 unspecified: Secondary | ICD-10-CM | POA: Diagnosis not present

## 2020-04-10 DIAGNOSIS — W19XXXA Unspecified fall, initial encounter: Secondary | ICD-10-CM | POA: Diagnosis not present

## 2020-04-10 DIAGNOSIS — R531 Weakness: Secondary | ICD-10-CM | POA: Diagnosis not present

## 2020-04-11 DIAGNOSIS — I872 Venous insufficiency (chronic) (peripheral): Secondary | ICD-10-CM | POA: Diagnosis not present

## 2020-04-11 DIAGNOSIS — M1711 Unilateral primary osteoarthritis, right knee: Secondary | ICD-10-CM | POA: Diagnosis not present

## 2020-04-11 DIAGNOSIS — R05 Cough: Secondary | ICD-10-CM | POA: Diagnosis not present

## 2020-04-11 DIAGNOSIS — N183 Chronic kidney disease, stage 3 unspecified: Secondary | ICD-10-CM | POA: Diagnosis not present

## 2020-04-11 DIAGNOSIS — I129 Hypertensive chronic kidney disease with stage 1 through stage 4 chronic kidney disease, or unspecified chronic kidney disease: Secondary | ICD-10-CM | POA: Diagnosis not present

## 2020-04-11 DIAGNOSIS — W19XXXA Unspecified fall, initial encounter: Secondary | ICD-10-CM | POA: Diagnosis not present

## 2020-04-11 DIAGNOSIS — R531 Weakness: Secondary | ICD-10-CM | POA: Diagnosis not present

## 2020-04-11 NOTE — Progress Notes (Signed)
Subjective: 86 y.o. returns the office today for painful, elongated, thickened toenails which he cannot trim himself. Denies any redness or drainage around the nails. Denies any acute changes since last appointment and no new complaints today. Denies any systemic complaints such as fevers, chills, nausea, vomiting.   PCP: [name], MD  Objective: NAD DP/PT pulses palpable, CRT less than 3 seconds Nails hypertrophic, dystrophic, elongated, brittle, discolored 10. There is tenderness overlying the nails 1-5 bilaterally. There is no surrounding erythema or drainage along the nail sites. No open lesions or pre-ulcerative lesions are identified. No other areas of tenderness bilateral lower extremities. No overlying edema, erythema, increased warmth. No pain with calf compression, swelling, warmth, erythema.  Assessment: Patient presents with symptomatic onychomycosis  Plan: -Treatment options including alternatives, risks, complications were discussed -Nails sharply debrided 10 without complication/bleeding. -Discussed daily foot inspection. If there are any changes, to call the office immediately.  -Follow-up in 3 months or sooner if any problems are to arise. In the meantime, encouraged to call the office with any questions, concerns, changes symptoms.  Celesta Gentile, DPM

## 2020-04-12 ENCOUNTER — Telehealth: Payer: Self-pay | Admitting: Family Medicine

## 2020-04-13 NOTE — Telephone Encounter (Signed)
Error

## 2020-04-16 DIAGNOSIS — I872 Venous insufficiency (chronic) (peripheral): Secondary | ICD-10-CM | POA: Diagnosis not present

## 2020-04-16 DIAGNOSIS — R531 Weakness: Secondary | ICD-10-CM | POA: Diagnosis not present

## 2020-04-16 DIAGNOSIS — I129 Hypertensive chronic kidney disease with stage 1 through stage 4 chronic kidney disease, or unspecified chronic kidney disease: Secondary | ICD-10-CM | POA: Diagnosis not present

## 2020-04-16 DIAGNOSIS — W19XXXA Unspecified fall, initial encounter: Secondary | ICD-10-CM | POA: Diagnosis not present

## 2020-04-16 DIAGNOSIS — R05 Cough: Secondary | ICD-10-CM | POA: Diagnosis not present

## 2020-04-16 DIAGNOSIS — N183 Chronic kidney disease, stage 3 unspecified: Secondary | ICD-10-CM | POA: Diagnosis not present

## 2020-04-16 DIAGNOSIS — M1711 Unilateral primary osteoarthritis, right knee: Secondary | ICD-10-CM | POA: Diagnosis not present

## 2020-04-17 DIAGNOSIS — R531 Weakness: Secondary | ICD-10-CM | POA: Diagnosis not present

## 2020-04-17 DIAGNOSIS — W19XXXA Unspecified fall, initial encounter: Secondary | ICD-10-CM | POA: Diagnosis not present

## 2020-04-17 DIAGNOSIS — R05 Cough: Secondary | ICD-10-CM | POA: Diagnosis not present

## 2020-04-17 DIAGNOSIS — M1711 Unilateral primary osteoarthritis, right knee: Secondary | ICD-10-CM | POA: Diagnosis not present

## 2020-04-17 DIAGNOSIS — N183 Chronic kidney disease, stage 3 unspecified: Secondary | ICD-10-CM | POA: Diagnosis not present

## 2020-04-17 DIAGNOSIS — I872 Venous insufficiency (chronic) (peripheral): Secondary | ICD-10-CM | POA: Diagnosis not present

## 2020-04-17 DIAGNOSIS — I129 Hypertensive chronic kidney disease with stage 1 through stage 4 chronic kidney disease, or unspecified chronic kidney disease: Secondary | ICD-10-CM | POA: Diagnosis not present

## 2020-04-18 ENCOUNTER — Telehealth: Payer: Self-pay

## 2020-04-18 DIAGNOSIS — I129 Hypertensive chronic kidney disease with stage 1 through stage 4 chronic kidney disease, or unspecified chronic kidney disease: Secondary | ICD-10-CM | POA: Diagnosis not present

## 2020-04-18 DIAGNOSIS — N183 Chronic kidney disease, stage 3 unspecified: Secondary | ICD-10-CM | POA: Diagnosis not present

## 2020-04-18 DIAGNOSIS — M1711 Unilateral primary osteoarthritis, right knee: Secondary | ICD-10-CM | POA: Diagnosis not present

## 2020-04-18 DIAGNOSIS — I872 Venous insufficiency (chronic) (peripheral): Secondary | ICD-10-CM | POA: Diagnosis not present

## 2020-04-18 DIAGNOSIS — W19XXXA Unspecified fall, initial encounter: Secondary | ICD-10-CM | POA: Diagnosis not present

## 2020-04-18 DIAGNOSIS — R05 Cough: Secondary | ICD-10-CM | POA: Diagnosis not present

## 2020-04-18 DIAGNOSIS — R531 Weakness: Secondary | ICD-10-CM | POA: Diagnosis not present

## 2020-04-18 NOTE — Telephone Encounter (Signed)
Physical therapist went to do therapy and was told that pt had fallen in the bathroom while transferring from wheelchair to toilet. Ems came and checked him out. Vital signs were normal, with no complaints of pain. Pt feels no need to go to ER. Pt states he did not hit his head when he fell. He is currently with pt and pt is reporting no injury from the occurrence. Therapist recommends he wear shorts in the house to make it easier to maneuver around.

## 2020-04-18 NOTE — Telephone Encounter (Signed)
Call received from [facility] to get verbal order for evaluation from Social work for community recourses. Order had been given. No other questions at this time.

## 2020-04-19 DIAGNOSIS — W19XXXA Unspecified fall, initial encounter: Secondary | ICD-10-CM | POA: Diagnosis not present

## 2020-04-19 DIAGNOSIS — I872 Venous insufficiency (chronic) (peripheral): Secondary | ICD-10-CM | POA: Diagnosis not present

## 2020-04-19 DIAGNOSIS — R531 Weakness: Secondary | ICD-10-CM | POA: Diagnosis not present

## 2020-04-19 DIAGNOSIS — N183 Chronic kidney disease, stage 3 unspecified: Secondary | ICD-10-CM | POA: Diagnosis not present

## 2020-04-19 DIAGNOSIS — R05 Cough: Secondary | ICD-10-CM | POA: Diagnosis not present

## 2020-04-19 DIAGNOSIS — M1711 Unilateral primary osteoarthritis, right knee: Secondary | ICD-10-CM | POA: Diagnosis not present

## 2020-04-19 DIAGNOSIS — I129 Hypertensive chronic kidney disease with stage 1 through stage 4 chronic kidney disease, or unspecified chronic kidney disease: Secondary | ICD-10-CM | POA: Diagnosis not present

## 2020-04-19 NOTE — Telephone Encounter (Signed)
FYI

## 2020-04-19 NOTE — Telephone Encounter (Signed)
Glad hes ok- I like the idea of wearing shorts- if hes cold in a chair can wear blanket and then remove this before getting up for bathroom

## 2020-04-19 NOTE — Telephone Encounter (Signed)
Sent message from r. Hunter thru [employer].

## 2020-04-23 DIAGNOSIS — R05 Cough: Secondary | ICD-10-CM | POA: Diagnosis not present

## 2020-04-23 DIAGNOSIS — I129 Hypertensive chronic kidney disease with stage 1 through stage 4 chronic kidney disease, or unspecified chronic kidney disease: Secondary | ICD-10-CM | POA: Diagnosis not present

## 2020-04-23 DIAGNOSIS — W19XXXA Unspecified fall, initial encounter: Secondary | ICD-10-CM | POA: Diagnosis not present

## 2020-04-23 DIAGNOSIS — M1711 Unilateral primary osteoarthritis, right knee: Secondary | ICD-10-CM | POA: Diagnosis not present

## 2020-04-23 DIAGNOSIS — I872 Venous insufficiency (chronic) (peripheral): Secondary | ICD-10-CM | POA: Diagnosis not present

## 2020-04-23 DIAGNOSIS — R531 Weakness: Secondary | ICD-10-CM | POA: Diagnosis not present

## 2020-04-23 DIAGNOSIS — N183 Chronic kidney disease, stage 3 unspecified: Secondary | ICD-10-CM | POA: Diagnosis not present

## 2020-04-24 DIAGNOSIS — M1711 Unilateral primary osteoarthritis, right knee: Secondary | ICD-10-CM | POA: Diagnosis not present

## 2020-04-24 DIAGNOSIS — N183 Chronic kidney disease, stage 3 unspecified: Secondary | ICD-10-CM | POA: Diagnosis not present

## 2020-04-24 DIAGNOSIS — I129 Hypertensive chronic kidney disease with stage 1 through stage 4 chronic kidney disease, or unspecified chronic kidney disease: Secondary | ICD-10-CM | POA: Diagnosis not present

## 2020-04-24 DIAGNOSIS — R05 Cough: Secondary | ICD-10-CM | POA: Diagnosis not present

## 2020-04-24 DIAGNOSIS — I872 Venous insufficiency (chronic) (peripheral): Secondary | ICD-10-CM | POA: Diagnosis not present

## 2020-04-24 DIAGNOSIS — R531 Weakness: Secondary | ICD-10-CM | POA: Diagnosis not present

## 2020-04-24 DIAGNOSIS — W19XXXA Unspecified fall, initial encounter: Secondary | ICD-10-CM | POA: Diagnosis not present

## 2020-04-26 DIAGNOSIS — R531 Weakness: Secondary | ICD-10-CM | POA: Diagnosis not present

## 2020-04-26 DIAGNOSIS — M1711 Unilateral primary osteoarthritis, right knee: Secondary | ICD-10-CM | POA: Diagnosis not present

## 2020-04-26 DIAGNOSIS — R05 Cough: Secondary | ICD-10-CM | POA: Diagnosis not present

## 2020-04-26 DIAGNOSIS — N183 Chronic kidney disease, stage 3 unspecified: Secondary | ICD-10-CM | POA: Diagnosis not present

## 2020-04-26 DIAGNOSIS — W19XXXA Unspecified fall, initial encounter: Secondary | ICD-10-CM | POA: Diagnosis not present

## 2020-04-26 DIAGNOSIS — I872 Venous insufficiency (chronic) (peripheral): Secondary | ICD-10-CM | POA: Diagnosis not present

## 2020-04-26 DIAGNOSIS — I129 Hypertensive chronic kidney disease with stage 1 through stage 4 chronic kidney disease, or unspecified chronic kidney disease: Secondary | ICD-10-CM | POA: Diagnosis not present

## 2020-04-27 DIAGNOSIS — N183 Chronic kidney disease, stage 3 unspecified: Secondary | ICD-10-CM | POA: Diagnosis not present

## 2020-04-27 DIAGNOSIS — R531 Weakness: Secondary | ICD-10-CM | POA: Diagnosis not present

## 2020-04-27 DIAGNOSIS — I872 Venous insufficiency (chronic) (peripheral): Secondary | ICD-10-CM | POA: Diagnosis not present

## 2020-04-27 DIAGNOSIS — R05 Cough: Secondary | ICD-10-CM | POA: Diagnosis not present

## 2020-04-27 DIAGNOSIS — I129 Hypertensive chronic kidney disease with stage 1 through stage 4 chronic kidney disease, or unspecified chronic kidney disease: Secondary | ICD-10-CM | POA: Diagnosis not present

## 2020-04-27 DIAGNOSIS — M1711 Unilateral primary osteoarthritis, right knee: Secondary | ICD-10-CM | POA: Diagnosis not present

## 2020-04-27 DIAGNOSIS — W19XXXA Unspecified fall, initial encounter: Secondary | ICD-10-CM | POA: Diagnosis not present

## 2020-05-01 ENCOUNTER — Telehealth: Payer: Self-pay | Admitting: Family Medicine

## 2020-05-01 DIAGNOSIS — W19XXXA Unspecified fall, initial encounter: Secondary | ICD-10-CM | POA: Diagnosis not present

## 2020-05-01 DIAGNOSIS — N183 Chronic kidney disease, stage 3 unspecified: Secondary | ICD-10-CM | POA: Diagnosis not present

## 2020-05-01 DIAGNOSIS — R531 Weakness: Secondary | ICD-10-CM | POA: Diagnosis not present

## 2020-05-01 DIAGNOSIS — M1711 Unilateral primary osteoarthritis, right knee: Secondary | ICD-10-CM | POA: Diagnosis not present

## 2020-05-01 DIAGNOSIS — I872 Venous insufficiency (chronic) (peripheral): Secondary | ICD-10-CM | POA: Diagnosis not present

## 2020-05-01 DIAGNOSIS — R05 Cough: Secondary | ICD-10-CM | POA: Diagnosis not present

## 2020-05-01 DIAGNOSIS — I129 Hypertensive chronic kidney disease with stage 1 through stage 4 chronic kidney disease, or unspecified chronic kidney disease: Secondary | ICD-10-CM | POA: Diagnosis not present

## 2020-05-01 NOTE — Telephone Encounter (Signed)
Will is calling from [facility] asking for verbal orders to extend PT for 1 week 1 and 2 week 2.

## 2020-05-02 NOTE — Telephone Encounter (Signed)
Called and provided VO for pt.

## 2020-05-03 DIAGNOSIS — R05 Cough: Secondary | ICD-10-CM | POA: Diagnosis not present

## 2020-05-03 DIAGNOSIS — N183 Chronic kidney disease, stage 3 unspecified: Secondary | ICD-10-CM | POA: Diagnosis not present

## 2020-05-03 DIAGNOSIS — R531 Weakness: Secondary | ICD-10-CM | POA: Diagnosis not present

## 2020-05-03 DIAGNOSIS — M1711 Unilateral primary osteoarthritis, right knee: Secondary | ICD-10-CM | POA: Diagnosis not present

## 2020-05-03 DIAGNOSIS — I872 Venous insufficiency (chronic) (peripheral): Secondary | ICD-10-CM | POA: Diagnosis not present

## 2020-05-03 DIAGNOSIS — I129 Hypertensive chronic kidney disease with stage 1 through stage 4 chronic kidney disease, or unspecified chronic kidney disease: Secondary | ICD-10-CM | POA: Diagnosis not present

## 2020-05-03 DIAGNOSIS — W19XXXA Unspecified fall, initial encounter: Secondary | ICD-10-CM | POA: Diagnosis not present

## 2020-05-07 DIAGNOSIS — W19XXXA Unspecified fall, initial encounter: Secondary | ICD-10-CM | POA: Diagnosis not present

## 2020-05-07 DIAGNOSIS — N183 Chronic kidney disease, stage 3 unspecified: Secondary | ICD-10-CM | POA: Diagnosis not present

## 2020-05-07 DIAGNOSIS — I129 Hypertensive chronic kidney disease with stage 1 through stage 4 chronic kidney disease, or unspecified chronic kidney disease: Secondary | ICD-10-CM | POA: Diagnosis not present

## 2020-05-07 DIAGNOSIS — R531 Weakness: Secondary | ICD-10-CM | POA: Diagnosis not present

## 2020-05-07 DIAGNOSIS — M1711 Unilateral primary osteoarthritis, right knee: Secondary | ICD-10-CM | POA: Diagnosis not present

## 2020-05-07 DIAGNOSIS — I872 Venous insufficiency (chronic) (peripheral): Secondary | ICD-10-CM | POA: Diagnosis not present

## 2020-05-07 DIAGNOSIS — R05 Cough: Secondary | ICD-10-CM | POA: Diagnosis not present

## 2020-05-09 ENCOUNTER — Ambulatory Visit: Payer: Medicare Other | Admitting: Internal Medicine

## 2020-05-09 ENCOUNTER — Telehealth: Payer: Self-pay

## 2020-05-09 DIAGNOSIS — R531 Weakness: Secondary | ICD-10-CM | POA: Diagnosis not present

## 2020-05-09 DIAGNOSIS — I872 Venous insufficiency (chronic) (peripheral): Secondary | ICD-10-CM | POA: Diagnosis not present

## 2020-05-09 DIAGNOSIS — W19XXXA Unspecified fall, initial encounter: Secondary | ICD-10-CM | POA: Diagnosis not present

## 2020-05-09 DIAGNOSIS — R05 Cough: Secondary | ICD-10-CM | POA: Diagnosis not present

## 2020-05-09 DIAGNOSIS — I129 Hypertensive chronic kidney disease with stage 1 through stage 4 chronic kidney disease, or unspecified chronic kidney disease: Secondary | ICD-10-CM | POA: Diagnosis not present

## 2020-05-09 DIAGNOSIS — M1711 Unilateral primary osteoarthritis, right knee: Secondary | ICD-10-CM | POA: Diagnosis not present

## 2020-05-09 DIAGNOSIS — N183 Chronic kidney disease, stage 3 unspecified: Secondary | ICD-10-CM | POA: Diagnosis not present

## 2020-05-09 NOTE — Telephone Encounter (Signed)
Physical therapy calling to let us know pt had another fall in the bathroom yesterday. He went to stand up from sitting on commode and lost his balance. His right knee is sore, but no injury. EMS came and helped him get back in chair. Per pt.'s wife, EMS said he did not need to go to the hospital and everything checked out clear. Physical Therapy is there today and states that the pain level in his knee is still hovering around the same level of a 6, as it has been for some time.  Physical therapy recommended to pt and wife that he keep a walker in the restroom with him.

## 2020-05-10 DIAGNOSIS — R531 Weakness: Secondary | ICD-10-CM | POA: Diagnosis not present

## 2020-05-10 DIAGNOSIS — W19XXXA Unspecified fall, initial encounter: Secondary | ICD-10-CM | POA: Diagnosis not present

## 2020-05-10 DIAGNOSIS — R05 Cough: Secondary | ICD-10-CM | POA: Diagnosis not present

## 2020-05-10 DIAGNOSIS — N183 Chronic kidney disease, stage 3 unspecified: Secondary | ICD-10-CM | POA: Diagnosis not present

## 2020-05-10 DIAGNOSIS — M1711 Unilateral primary osteoarthritis, right knee: Secondary | ICD-10-CM | POA: Diagnosis not present

## 2020-05-10 DIAGNOSIS — I872 Venous insufficiency (chronic) (peripheral): Secondary | ICD-10-CM | POA: Diagnosis not present

## 2020-05-10 DIAGNOSIS — I129 Hypertensive chronic kidney disease with stage 1 through stage 4 chronic kidney disease, or unspecified chronic kidney disease: Secondary | ICD-10-CM | POA: Diagnosis not present

## 2020-05-10 NOTE — Telephone Encounter (Signed)
Noted! Thank you

## 2020-05-10 NOTE — Telephone Encounter (Signed)
Completely agree with walker at all times- thanks. Happy tos ee him or have him in with sports medicine if needed for knee issue

## 2020-05-10 NOTE — Telephone Encounter (Signed)
Spoke to pt and pt wife. His knee is okay-no bruises. Reminded pt about appt 10/15.

## 2020-05-14 ENCOUNTER — Encounter: Payer: Self-pay | Admitting: Adult Health

## 2020-05-14 ENCOUNTER — Other Ambulatory Visit: Payer: Self-pay

## 2020-05-14 ENCOUNTER — Ambulatory Visit (INDEPENDENT_AMBULATORY_CARE_PROVIDER_SITE_OTHER): Payer: Medicare Other

## 2020-05-14 ENCOUNTER — Ambulatory Visit: Payer: Medicare Other | Admitting: Adult Health

## 2020-05-14 VITALS — BP 100/64 | HR 68 | Temp 98.2°F

## 2020-05-14 DIAGNOSIS — R059 Cough, unspecified: Secondary | ICD-10-CM

## 2020-05-14 DIAGNOSIS — J438 Other emphysema: Secondary | ICD-10-CM

## 2020-05-14 DIAGNOSIS — R05 Cough: Secondary | ICD-10-CM

## 2020-05-14 DIAGNOSIS — R0600 Dyspnea, unspecified: Secondary | ICD-10-CM | POA: Diagnosis not present

## 2020-05-14 DIAGNOSIS — R053 Chronic cough: Secondary | ICD-10-CM

## 2020-05-14 DIAGNOSIS — J449 Chronic obstructive pulmonary disease, unspecified: Secondary | ICD-10-CM | POA: Diagnosis not present

## 2020-05-14 DIAGNOSIS — J9811 Atelectasis: Secondary | ICD-10-CM | POA: Diagnosis not present

## 2020-05-14 MED ORDER — SPIRIVA RESPIMAT 2.5 MCG/ACT IN AERS: 2.0000 | INHALATION_SPRAY | Freq: Every day | RESPIRATORY_TRACT | 5 refills | Status: DC

## 2020-05-14 MED ORDER — SPIRIVA RESPIMAT 2.5 MCG/ACT IN AERS
2.0000 | INHALATION_SPRAY | Freq: Every day | RESPIRATORY_TRACT | 0 refills | Status: DC
Start: 2020-05-14 — End: 2020-05-22

## 2020-05-14 NOTE — Patient Instructions (Addendum)
Set up for Modified barium swallow  Begin Spiriva 2 puffs daily .  Begin Liquid Mucinex DM or Robitussin DM As needed  Cough  Aspirations precautions as discussed.  Chest xray today .  Follow up with Dr. Melvyn Novas  In 3 months and As needed

## 2020-05-14 NOTE — Progress Notes (Signed)
@Patient  ID: Roger [name], male    DOB: August 02, 1920, 86 y.o.   MRN: [id number]  Chief Complaint  Patient presents with  . 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  Swallow eval January 2019 + for dysphagia-significant pharyngeal dysphagia with significant moderate aspiration risk-recommended for a dysphagia 3 diet mechanical soft with thin liquids.  Recommended to have a slow rate, small sips and bites, multiple dry swallows after each bite.  05/14/2020 Follow up : Chronic cough Patient returns for a 62-month follow-up.  Patient complains he continues to have an ongoing cough that is intermittently productive with thick mucus.  Seems to be worse especially after eating.  Patient does have occasional choking.  Previously in 2019 patient has significant dysphagia.  Was recommended on a aspiration precautions and dysphagia 3 diet.  Patient's diet at home is a regular diet.  He does have a caregiver today.  With him.  Patient lives alone with his wife who is 100. Patient has a strong smoking history.  Previous high-resolution CT chest in February 2019 showed emphysema and pulmonary fibrosis. Patient is not on any inhalers. He denies any significant shortness of breath but says he has an ongoing cough that will not go away.  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 He denies any fever, hemoptysis, chest pain orthopnea.      Allergies  Allergen Reactions  . Amlodipine Besylate     REACTION: swelling of feet  . Aspirin     REACTION: GI upset can tolerate ecotrin  . Hydrocodone     GI upset    Immunization History  Administered Date(s)  Administered  . Fluad Quad(high Dose 65+) 05/09/2019  . Influenza Split 05/24/2012  . Influenza Whole 06/17/2007, 05/26/2008, 06/05/2009, 05/06/2010  . Influenza, High Dose Seasonal PF 06/10/2013, 07/13/2014, 05/23/2016, 05/25/2017, 05/17/2018  . Influenza,inj,Quad PF,6+ Mos 07/05/2015  . PFIZER SARS-COV-2 Vaccination 10/16/2019, 11/09/2019  . PPD Test 03/12/2015  . Pneumococcal Conjugate-13 11/28/2014  . Pneumococcal Polysaccharide-23 08/25/1994, 06/17/2007  . Td 08/25/1992, 02/05/2009, 03/15/2018  . Zoster 12/13/2013    Past Medical History:  Diagnosis Date  . GERD 02/22/2007  . HYPERLIPIDEMIA 02/22/2007  . HYPERTENSION 06/17/2007  . MACULAR DEGENERATION 02/22/2007  . OSTEOARTHRITIS, HIP, RIGHT 06/05/2009    Tobacco History: Social History   Tobacco Use  Smoking Status Former Smoker  . Packs/day: 1.00  . Years: 20.00  . Pack years: 20.00  . Types: Cigars, Cigarettes, Pipe  . Quit date: 08/25/2014  . Years since quitting: 5.7  Smokeless Tobacco Never Used   Counseling given: Not Answered   Outpatient Medications Prior to Visit  Medication Sig Dispense Refill  . benzonatate (TESSALON) 100 MG capsule TAKE 1 CAPSULE (100 MG TOTAL) BY MOUTH 2 (TWO) TIMES DAILY AS NEEDED FOR COUGH. 60 capsule 2  . furosemide (LASIX) 20 MG tablet Take 1 tablet (20 mg total) by mouth daily as needed (weight gain or increased leg swelling). 90 tablet 1  . lovastatin (MEVACOR) 40 MG tablet TAKE 1 TABLET BY MOUTH  DAILY 90 tablet 3  . Multiple Vitamins-Minerals (MULTIVITAMIN WITH MINERALS) tablet Take 1 tablet by mouth daily.    . mupirocin ointment (BACTROBAN) 2 %  Apply 1 application topically 2 (two) times daily. 30 g 2  . pantoprazole (PROTONIX) 40 MG tablet Take 30- 60 min before your first and last meals of the day 60 tablet 2  . sertraline (ZOLOFT) 100 MG tablet Take 1 tablet (100 mg total) by mouth daily. 90 tablet 3  . UNABLE TO FIND Med Name: Med pass 120 mL by mouth twice daily for  supplement     No facility-administered medications prior to visit.     Review of Systems:   Constitutional:   No  weight loss, night sweats,  Fevers, chills +, fatigue, or  lassitude.  HEENT:   No headaches,  Difficulty swallowing,  Tooth/dental problems, or  Sore throat,                No sneezing, itching, ear ache, nasal congestion, post nasal drip,   CV:  No chest pain,  Orthopnea, PND, +swelling in lower extremities, anasarca, dizziness, palpitations, syncope.   GI  No heartburn, indigestion, abdominal pain, nausea, vomiting, diarrhea, change in bowel habits, loss of appetite, bloody stools.   Resp:  .  No chest wall deformity  Skin: no rash or lesions.  GU: no dysuria, change in color of urine, no urgency or frequency.  No flank pain, no hematuria   MS:  No joint pain or swelling.  No decreased range of motion.  No back pain.    Physical Exam  BP 100/64 (BP Location: Left Arm, Cuff Size: Normal)   Pulse 68   Temp 98.2 F (36.8 C) (Temporal)   SpO2 94% Comment: RA  GEN: A/Ox3; pleasant , NAD, elderly in wheelchair   HEENT:  [place]/AT,   NOSE-clear, THROAT-clear, no lesions, no postnasal drip or exudate noted.   NECK:  Supple w/ fair ROM; no JVD; normal carotid impulses w/o bruits; no thyromegaly or nodules palpated; no lymphadenopathy.    RESP  Clear  P & A; w/o, wheezes/ rales/ or rhonchi. no accessory muscle use, no dullness to percussion  CARD:  RRR, no m/r/g, 1+ peripheral edema, pulses intact, no cyanosis or clubbing.  GI:   Soft & nt; nml bowel sounds; no organomegaly or masses detected.   Musco: Warm bil, no deformities or joint swelling noted.   Neuro: alert, no focal deficits noted.    Skin: Warm, no lesions or rashes    Lab Results:  CBC   ProBNP No results found for: PROBNP  Imaging: No results found.    No flowsheet data found.  No results found for: NITRICOXIDE      Assessment & Plan:   Chronic cough Chronic cough suspect  is multifactorial in nature as patient has underlying dysphagia and is high risk for aspiration.  We will set him up for a modified barium swallow.  If positive will need to begin a specialized diet per recommendations. Have discussed in detail with patient and caregiver regarding aspiration precautions.  Patient also has underlying emphysema and pulmonary fibrosis.  We will add in liquid Mucinex DM or Robitussin-DM.  Also will trial of Spiriva daily as he has underlying emphysema as well.  Plan  Patient Instructions  Set up for Modified barium swallow  Begin Spiriva 2 puffs daily .  Begin Liquid Mucinex DM or Robitussin DM As needed  Cough  Aspirations precautions as discussed.  Chest xray today .  Follow up with Dr. Melvyn Novas  In 3 months and As needed       '  Other emphysema [facility]) Emphysema  noted on CT chest 2019.  Patient has a strong smoking history.  Will check chest x-ray today.  Trial of Spiriva to see if this helps with his cough.  Plan  Patient Instructions  Set up for Modified barium swallow  Begin Spiriva 2 puffs daily .  Begin Liquid Mucinex DM or Robitussin DM As needed  Cough  Aspirations precautions as discussed.  Chest xray today .  Follow up with Dr. Melvyn Novas  In 3 months and As needed             Rexene Edison, NP 05/14/2020

## 2020-05-14 NOTE — Assessment & Plan Note (Signed)
Chronic cough suspect is multifactorial in nature as patient has underlying dysphagia and is high risk for aspiration.  We will set him up for a modified barium swallow.  If positive will need to begin a specialized diet per recommendations. Have discussed in detail with patient and caregiver regarding aspiration precautions.  Patient also has underlying emphysema and pulmonary fibrosis.  We will add in liquid Mucinex DM or Robitussin-DM.  Also will trial of Spiriva daily as he has underlying emphysema as well.  Plan  Patient Instructions  Set up for Modified barium swallow  Begin Spiriva 2 puffs daily .  Begin Liquid Mucinex DM or Robitussin DM As needed  Cough  Aspirations precautions as discussed.  Chest xray today .  Follow up with Dr. Melvyn Novas  In 3 months and As needed       '

## 2020-05-14 NOTE — Assessment & Plan Note (Signed)
Emphysema noted on CT chest 2019.  Patient has a strong smoking history.  Will check chest x-ray today.  Trial of Spiriva to see if this helps with his cough.  Plan  Patient Instructions  Set up for Modified barium swallow  Begin Spiriva 2 puffs daily .  Begin Liquid Mucinex DM or Robitussin DM As needed  Cough  Aspirations precautions as discussed.  Chest xray today .  Follow up with Dr. Melvyn Novas  In 3 months and As needed

## 2020-05-15 ENCOUNTER — Telehealth: Payer: Self-pay | Admitting: Internal Medicine

## 2020-05-15 DIAGNOSIS — I129 Hypertensive chronic kidney disease with stage 1 through stage 4 chronic kidney disease, or unspecified chronic kidney disease: Secondary | ICD-10-CM | POA: Diagnosis not present

## 2020-05-15 DIAGNOSIS — R531 Weakness: Secondary | ICD-10-CM | POA: Diagnosis not present

## 2020-05-15 DIAGNOSIS — R05 Cough: Secondary | ICD-10-CM | POA: Diagnosis not present

## 2020-05-15 DIAGNOSIS — R053 Chronic cough: Secondary | ICD-10-CM

## 2020-05-15 DIAGNOSIS — W19XXXA Unspecified fall, initial encounter: Secondary | ICD-10-CM | POA: Diagnosis not present

## 2020-05-15 DIAGNOSIS — I872 Venous insufficiency (chronic) (peripheral): Secondary | ICD-10-CM | POA: Diagnosis not present

## 2020-05-15 DIAGNOSIS — M1711 Unilateral primary osteoarthritis, right knee: Secondary | ICD-10-CM | POA: Diagnosis not present

## 2020-05-15 DIAGNOSIS — N183 Chronic kidney disease, stage 3 unspecified: Secondary | ICD-10-CM | POA: Diagnosis not present

## 2020-05-15 MED ORDER — PANTOPRAZOLE SODIUM 40 MG PO TBEC: DELAYED_RELEASE_TABLET | ORAL | 2 refills | Status: DC

## 2020-05-15 NOTE — Telephone Encounter (Signed)
Pt's daughter calling in with pharmacy information  -pharmacy info [facility] - please advise Roger [name] - [phone] and she got a message for test results on mychart regarding test results - if anyone needs to talk to her about those they can call back as well.

## 2020-05-15 NOTE — Telephone Encounter (Signed)
Pantoprazole refill sent to [facility] per patient request. Called and spoke with Patient's Daughter, [name].  [name] is aware.  Nothing further at this time.

## 2020-05-15 NOTE — Telephone Encounter (Signed)
Attempted to call [name] but unable to reach. Left message for her to return call.  We need to verify what pharmacy she wants pt's pantoprazole to be sent to and once we have that info, we can send Rx to pharmacy for pt. Will await a return call with this info.

## 2020-05-16 ENCOUNTER — Other Ambulatory Visit (HOSPITAL_COMMUNITY): Payer: Self-pay

## 2020-05-16 DIAGNOSIS — R05 Cough: Secondary | ICD-10-CM | POA: Diagnosis not present

## 2020-05-16 DIAGNOSIS — R531 Weakness: Secondary | ICD-10-CM | POA: Diagnosis not present

## 2020-05-16 DIAGNOSIS — N183 Chronic kidney disease, stage 3 unspecified: Secondary | ICD-10-CM | POA: Diagnosis not present

## 2020-05-16 DIAGNOSIS — I129 Hypertensive chronic kidney disease with stage 1 through stage 4 chronic kidney disease, or unspecified chronic kidney disease: Secondary | ICD-10-CM | POA: Diagnosis not present

## 2020-05-16 DIAGNOSIS — I872 Venous insufficiency (chronic) (peripheral): Secondary | ICD-10-CM | POA: Diagnosis not present

## 2020-05-16 DIAGNOSIS — M1711 Unilateral primary osteoarthritis, right knee: Secondary | ICD-10-CM | POA: Diagnosis not present

## 2020-05-16 DIAGNOSIS — W19XXXA Unspecified fall, initial encounter: Secondary | ICD-10-CM | POA: Diagnosis not present

## 2020-05-16 DIAGNOSIS — R1310 Dysphagia, unspecified: Secondary | ICD-10-CM

## 2020-05-21 ENCOUNTER — Telehealth: Payer: Self-pay | Admitting: Internal Medicine

## 2020-05-21 DIAGNOSIS — R531 Weakness: Secondary | ICD-10-CM | POA: Diagnosis not present

## 2020-05-21 DIAGNOSIS — N183 Chronic kidney disease, stage 3 unspecified: Secondary | ICD-10-CM | POA: Diagnosis not present

## 2020-05-21 DIAGNOSIS — I129 Hypertensive chronic kidney disease with stage 1 through stage 4 chronic kidney disease, or unspecified chronic kidney disease: Secondary | ICD-10-CM | POA: Diagnosis not present

## 2020-05-21 DIAGNOSIS — I872 Venous insufficiency (chronic) (peripheral): Secondary | ICD-10-CM | POA: Diagnosis not present

## 2020-05-21 DIAGNOSIS — R05 Cough: Secondary | ICD-10-CM | POA: Diagnosis not present

## 2020-05-21 DIAGNOSIS — M1711 Unilateral primary osteoarthritis, right knee: Secondary | ICD-10-CM | POA: Diagnosis not present

## 2020-05-21 DIAGNOSIS — W19XXXA Unspecified fall, initial encounter: Secondary | ICD-10-CM | POA: Diagnosis not present

## 2020-05-21 NOTE — Telephone Encounter (Signed)
Called and spoke with [name] [facility]) who stated that pt is having problems trying to use the Spiriva. [name] stated that her mom is having difficulty turning the Spiriva to help pt out and also pt then  Has problems trying to find the button to be able to give him the medication.  [name] is wanting to know if there is another inhaler that would be easier for pt to be able to use. Dr. Melvyn Novas, please advise.  [name] states when she has gone over to see pt, she is able to help him use it but she isn't able to go over there all the time and she knows that pt has wasted some of the doses from the samples that he was given due to having difficulty. She states that she does believe that the Spiriva has helped with pt's coughing and breathing though.  Dr. Melvyn Novas, please advise.

## 2020-05-22 MED ORDER — INCRUSE ELLIPTA 62.5 MCG/INH IN AEPB: 1.0000 | INHALATION_SPRAY | Freq: Every day | RESPIRATORY_TRACT | 0 refills | Status: DC

## 2020-05-22 NOTE — Telephone Encounter (Signed)
Spoke with daughter [name] and discussed Bevespi and Incruse.  [name] preferred to try Incruse.  1 month rx was sent to local pharmacy.  Will call back to report how pt is doing on this med.  Nothing further needed at this time- will close encounter.

## 2020-05-22 NOTE — Telephone Encounter (Signed)
Can try bevespi 2 bid which is just push and breathe but the easiest to use incruse one click each am but is a powder and may cause more coughing,  The main benefit of these drugs is improved doe so unless dyspnea is really limiting activity they aren't really very helpful in pts who are very sedentary and ok to leave off all of them until re-eval if can't use them effectively

## 2020-05-22 NOTE — Progress Notes (Signed)
Unable to reach patient. Letter sent with result and notes from Rexene Edison NP.

## 2020-05-24 DIAGNOSIS — R531 Weakness: Secondary | ICD-10-CM | POA: Diagnosis not present

## 2020-05-24 DIAGNOSIS — I872 Venous insufficiency (chronic) (peripheral): Secondary | ICD-10-CM | POA: Diagnosis not present

## 2020-05-24 DIAGNOSIS — N183 Chronic kidney disease, stage 3 unspecified: Secondary | ICD-10-CM | POA: Diagnosis not present

## 2020-05-24 DIAGNOSIS — W19XXXA Unspecified fall, initial encounter: Secondary | ICD-10-CM | POA: Diagnosis not present

## 2020-05-24 DIAGNOSIS — I129 Hypertensive chronic kidney disease with stage 1 through stage 4 chronic kidney disease, or unspecified chronic kidney disease: Secondary | ICD-10-CM | POA: Diagnosis not present

## 2020-05-24 DIAGNOSIS — R05 Cough: Secondary | ICD-10-CM | POA: Diagnosis not present

## 2020-05-24 DIAGNOSIS — M1711 Unilateral primary osteoarthritis, right knee: Secondary | ICD-10-CM | POA: Diagnosis not present

## 2020-05-25 ENCOUNTER — Telehealth: Payer: Self-pay

## 2020-05-25 DIAGNOSIS — I872 Venous insufficiency (chronic) (peripheral): Secondary | ICD-10-CM | POA: Diagnosis not present

## 2020-05-25 DIAGNOSIS — W19XXXA Unspecified fall, initial encounter: Secondary | ICD-10-CM | POA: Diagnosis not present

## 2020-05-25 DIAGNOSIS — R531 Weakness: Secondary | ICD-10-CM | POA: Diagnosis not present

## 2020-05-25 DIAGNOSIS — I129 Hypertensive chronic kidney disease with stage 1 through stage 4 chronic kidney disease, or unspecified chronic kidney disease: Secondary | ICD-10-CM | POA: Diagnosis not present

## 2020-05-25 DIAGNOSIS — M1711 Unilateral primary osteoarthritis, right knee: Secondary | ICD-10-CM | POA: Diagnosis not present

## 2020-05-25 DIAGNOSIS — N183 Chronic kidney disease, stage 3 unspecified: Secondary | ICD-10-CM | POA: Diagnosis not present

## 2020-05-25 NOTE — Telephone Encounter (Signed)
Returned call and spoke with Will providing VO.

## 2020-05-25 NOTE — Telephone Encounter (Signed)
[facility] - Occupational Therapy   Completed medicare recertification and evaluation    Requesting verbal orders for OT for- 1x a week for 4 weeks   [place] to leave voicemail

## 2020-05-29 ENCOUNTER — Ambulatory Visit (HOSPITAL_COMMUNITY)
Admission: RE | Admit: 2020-05-29 | Discharge: 2020-05-29 | Disposition: A | Discharge status: Home / Self Care | Payer: Medicare Other | Source: Ambulatory Visit | Attending: Adult Health | Admitting: Adult Health

## 2020-05-29 ENCOUNTER — Telehealth: Payer: Self-pay

## 2020-05-29 ENCOUNTER — Other Ambulatory Visit: Payer: Self-pay

## 2020-05-29 DIAGNOSIS — W19XXXA Unspecified fall, initial encounter: Secondary | ICD-10-CM | POA: Diagnosis not present

## 2020-05-29 DIAGNOSIS — R1310 Dysphagia, unspecified: Secondary | ICD-10-CM | POA: Insufficient documentation

## 2020-05-29 DIAGNOSIS — R059 Cough, unspecified: Secondary | ICD-10-CM | POA: Diagnosis not present

## 2020-05-29 DIAGNOSIS — T17300A Unspecified foreign body in larynx causing asphyxiation, initial encounter: Secondary | ICD-10-CM | POA: Diagnosis not present

## 2020-05-29 DIAGNOSIS — M1711 Unilateral primary osteoarthritis, right knee: Secondary | ICD-10-CM | POA: Diagnosis not present

## 2020-05-29 DIAGNOSIS — R531 Weakness: Secondary | ICD-10-CM | POA: Diagnosis not present

## 2020-05-29 DIAGNOSIS — I872 Venous insufficiency (chronic) (peripheral): Secondary | ICD-10-CM

## 2020-05-29 DIAGNOSIS — N183 Chronic kidney disease, stage 3 unspecified: Secondary | ICD-10-CM | POA: Diagnosis not present

## 2020-05-29 DIAGNOSIS — I129 Hypertensive chronic kidney disease with stage 1 through stage 4 chronic kidney disease, or unspecified chronic kidney disease: Secondary | ICD-10-CM | POA: Diagnosis not present

## 2020-05-29 NOTE — Telephone Encounter (Signed)
Noted  

## 2020-05-29 NOTE — Telephone Encounter (Signed)
Home Health Certification or Plan of Care Tracking  Is this a Certification or Plan of Care?  [place] Agency: [facility]   Order Number:  [id number]    Where has form been placed:  [facility]

## 2020-05-30 DIAGNOSIS — R531 Weakness: Secondary | ICD-10-CM | POA: Diagnosis not present

## 2020-05-30 DIAGNOSIS — I872 Venous insufficiency (chronic) (peripheral): Secondary | ICD-10-CM | POA: Diagnosis not present

## 2020-05-30 DIAGNOSIS — W19XXXA Unspecified fall, initial encounter: Secondary | ICD-10-CM | POA: Diagnosis not present

## 2020-05-30 DIAGNOSIS — M1711 Unilateral primary osteoarthritis, right knee: Secondary | ICD-10-CM | POA: Diagnosis not present

## 2020-05-30 DIAGNOSIS — N183 Chronic kidney disease, stage 3 unspecified: Secondary | ICD-10-CM | POA: Diagnosis not present

## 2020-05-30 DIAGNOSIS — I129 Hypertensive chronic kidney disease with stage 1 through stage 4 chronic kidney disease, or unspecified chronic kidney disease: Secondary | ICD-10-CM | POA: Diagnosis not present

## 2020-06-01 DIAGNOSIS — N183 Chronic kidney disease, stage 3 unspecified: Secondary | ICD-10-CM | POA: Diagnosis not present

## 2020-06-01 DIAGNOSIS — W19XXXA Unspecified fall, initial encounter: Secondary | ICD-10-CM | POA: Diagnosis not present

## 2020-06-01 DIAGNOSIS — I129 Hypertensive chronic kidney disease with stage 1 through stage 4 chronic kidney disease, or unspecified chronic kidney disease: Secondary | ICD-10-CM | POA: Diagnosis not present

## 2020-06-01 DIAGNOSIS — M1711 Unilateral primary osteoarthritis, right knee: Secondary | ICD-10-CM | POA: Diagnosis not present

## 2020-06-01 DIAGNOSIS — I872 Venous insufficiency (chronic) (peripheral): Secondary | ICD-10-CM | POA: Diagnosis not present

## 2020-06-01 DIAGNOSIS — R531 Weakness: Secondary | ICD-10-CM | POA: Diagnosis not present

## 2020-06-04 ENCOUNTER — Telehealth: Payer: Medicare Other

## 2020-06-05 NOTE — Patient Instructions (Addendum)
Health Maintenance Due  Topic Date Due  . INFLUENZA VACCINE In office flu shot today high dose 03/25/2020   Please stop by lab before you go If you have mychart- we will send your results within 3 business days of [place] receiving them.  If you do not have mychart- we will call you about results within 5 business days of [place] receiving them.  *please note we are currently using [facility] which has a longer processing time than [facility] typically so labs may not come back as quickly as in the past *please also note that you will see labs on mychart as soon as they post. I will later go in and write notes on them- will say "notes from Dr. Yong Channel"  We will call you within two weeks about your referral to urology to see if they can help with bladder issue. If you do not hear within 3 weeks, give [place] a call.   [place] can you send in wheelchair request to [facility] under generalized weakness please

## 2020-06-05 NOTE — Progress Notes (Signed)
Phone [phone] In person visit   Subjective:   Roger [name] is a 86 y.o. year old very pleasant male patient who presents for/with See problem oriented charting Chief Complaint  Patient presents with  . Hyperlipidemia  . Hypertension  . Gastroesophageal Reflux   This visit occurred during the SARS-CoV-2 public health emergency.  Safety protocols were in place, including screening questions prior to the visit, additional usage of staff PPE, and extensive cleaning of exam room while observing appropriate contact time as indicated for disinfecting solutions.   Past Medical History-  Patient Active Problem List   Diagnosis Date Noted  . Dysphagia 06/06/2020    Priority: High  . Ascending aortic aneurysm ([place]) 10/17/2017    Priority: High  . Diastolic dysfunction 67/54/4920    Priority: High  . Overactive bladder 07/13/2014    Priority: High  . Allergic conjunctivitis of both eyes 06/22/2017    Priority: Medium  . Depression, major, single episode, complete remission ([place]) 07/05/2015    Priority: Medium  . History of pneumonia 03/10/2015    Priority: Medium  . Chronic cough 10/11/2014    Priority: Medium  . CKD (chronic kidney disease), stage III ([place]) 07/13/2014    Priority: Medium  . Syncope 11/17/2013    Priority: Medium  . Hypertension 06/17/2007    Priority: Medium  . Hyperlipidemia 02/22/2007    Priority: Medium  . Aortic atherosclerosis ([place]) 10/17/2017    Priority: Low  . Bronchitis 08/29/2017    Priority: Low  . Venous (peripheral) insufficiency 01/26/2015    Priority: Low  . Macular degeneration 06/19/2011    Priority: Low  . Osteoarthritis 06/05/2009    Priority: Low  . CARCINOMA, SKIN, SQUAMOUS CELL 05/26/2008    Priority: Low  . GERD (gastroesophageal reflux disease) 02/22/2007    Priority: Low  . Other emphysema ([place]) 05/14/2020  . Right hip pain 09/13/2018  . Laceration of scalp 10/17/2017  . Onychomycosis 10/22/2016    Medications-  reviewed and updated Current Outpatient Medications  Medication Sig Dispense Refill  . furosemide (LASIX) 20 MG tablet Take 1 tablet (20 mg total) by mouth daily as needed (weight gain or increased leg swelling). 90 tablet 1  . lovastatin (MEVACOR) 40 MG tablet TAKE 1 TABLET BY MOUTH  DAILY 90 tablet 3  . Multiple Vitamins-Minerals (MULTIVITAMIN WITH MINERALS) tablet Take 1 tablet by mouth daily.    . mupirocin ointment (BACTROBAN) 2 % Apply 1 application topically 2 (two) times daily. 30 g 2  . pantoprazole (PROTONIX) 40 MG tablet Take 30- 60 min before your first and last meals of the day 60 tablet 2  . sertraline (ZOLOFT) 100 MG tablet Take 1 tablet (100 mg total) by mouth daily. 90 tablet 3  . umeclidinium bromide (INCRUSE ELLIPTA) 62.5 MCG/INH AEPB Inhale 1 puff into the lungs daily. 30 each 0  . UNABLE TO FIND Med Name: Med pass 120 mL by mouth twice daily for supplement    . benzonatate (TESSALON) 100 MG capsule TAKE 1 CAPSULE (100 MG TOTAL) BY MOUTH 2 (TWO) TIMES DAILY AS NEEDED FOR COUGH. (Patient not taking: Reported on 06/06/2020) 60 capsule 2   No current facility-administered medications for this visit.     Objective:  BP 124/76   Pulse 70   Temp 97.9 F (36.6 C) (Temporal)   Ht 5\' 8"  (1.727 m)   SpO2 97%   BMI 19.92 kg/m  Gen: NAD, resting comfortably CV: RRR no murmurs rubs or gallops Lungs: CTAB no  crackles, wheeze, rhonchi Ext: 1-2+ edema Skin: warm, dry Neuro: wheelchair bound- was not able to stand well for weight today (working with [facility] for [facility])    Assessment and Plan   #throat clearing issues- severe pharyngeal cervical esophageal dysphagia. Significant worsening from 2019. Thought related to progressive declines in functional reserve including falls, deconditioning, weight loss- he is trying to swallow more firmly- slow rate, small sips/bites with multiple dry swallows after each bite.  - cough also better with start of Incruse elipta  #overactive  bladder/incontinence-patient with long-term issues with overactive bladder and incontinence.  We have tried oxybutynin in the past but September 28, 2017 patient began to have urinary retention symptoms so we stopped this medication.  I was concerned he may have similar issues with Myrbetriq and I have not tried this medication.  We discussed today referring to urology and he is open to this-referral was placed today  #hypertension S: medication:  Lasix just ad needed BP Readings from Last 3 Encounters:  06/06/20 124/76  05/14/20 100/64  03/27/20 129/73  A/P: blood pressure low today - as needed lasix ok as not getting significantly lightheaded.   # GERD S:Medication: Protonix 40Mg .  Possible silent reflux with cough A/P: started on this by Dr. Melvyn Novas- cough is better.    #hyperlipidemia S: Medication:Lovastatin 40 Mg Lab Results  Component Value Date   CHOL 137 05/09/2019   HDL 52.00 05/09/2019   LDLCALC 61 05/09/2019   LDLDIRECT 77.0 10/16/2016   TRIG 118.0 05/09/2019   CHOLHDL 3 05/09/2019   A/P: hopefully controlled update with labs todya  Recommended follow up: Return in about 4 months (around 10/07/2020) for follow up- or sooner if needed. Future Appointments  Date Time Provider [place]  07/10/2020  1:15 PM Trula Slade, DPM [facility]  07/11/2020 11:00 AM [facility] CCM PHARMACIST [facility]  08/13/2020  2:00 PM Tanda Rockers, MD [facility] None  09/27/2020  1:00 PM Rankin, Clent Demark, MD [facility] None  10/16/2020  1:40 PM Yong Channel Brayton Mars, MD [facility]    Lab/Order associations:   ICD-10-CM   1. Primary hypertension  I10   2. Gastroesophageal reflux disease without esophagitis  K21.9   3. Hyperlipidemia, unspecified hyperlipidemia type  E78.5 CBC With Differential/Platelet    COMPLETE METABOLIC PANEL WITH GFR    Lipid Panel (Refl)    COMPLETE METABOLIC PANEL WITH GFR    CBC With Differential/Platelet    Lipid Panel (Refl)  4. Dysphagia,  unspecified type  R13.10   5. Overactive bladder  N32.81 Ambulatory referral to Urology  6. Need for immunization against influenza  Z23 Flu Vaccine QUAD High Dose(Fluad)    Return precautions advised.  Garret Reddish, MD

## 2020-06-06 ENCOUNTER — Other Ambulatory Visit: Payer: Self-pay

## 2020-06-06 ENCOUNTER — Ambulatory Visit (INDEPENDENT_AMBULATORY_CARE_PROVIDER_SITE_OTHER): Payer: Medicare Other | Admitting: Family Medicine

## 2020-06-06 ENCOUNTER — Encounter: Payer: Self-pay | Admitting: Family Medicine

## 2020-06-06 VITALS — BP 124/76 | HR 70 | Temp 97.9°F | Ht 68.0 in

## 2020-06-06 DIAGNOSIS — E785 Hyperlipidemia, unspecified: Secondary | ICD-10-CM

## 2020-06-06 DIAGNOSIS — R531 Weakness: Secondary | ICD-10-CM

## 2020-06-06 DIAGNOSIS — Z23 Encounter for immunization: Secondary | ICD-10-CM | POA: Diagnosis not present

## 2020-06-06 DIAGNOSIS — I10 Essential (primary) hypertension: Secondary | ICD-10-CM

## 2020-06-06 DIAGNOSIS — R1310 Dysphagia, unspecified: Secondary | ICD-10-CM

## 2020-06-06 DIAGNOSIS — K219 Gastro-esophageal reflux disease without esophagitis: Secondary | ICD-10-CM

## 2020-06-06 DIAGNOSIS — N3281 Overactive bladder: Secondary | ICD-10-CM

## 2020-06-06 NOTE — Progress Notes (Signed)
I signed for this-sounds like it came out on the printer

## 2020-06-07 DIAGNOSIS — R531 Weakness: Secondary | ICD-10-CM | POA: Diagnosis not present

## 2020-06-07 DIAGNOSIS — I129 Hypertensive chronic kidney disease with stage 1 through stage 4 chronic kidney disease, or unspecified chronic kidney disease: Secondary | ICD-10-CM | POA: Diagnosis not present

## 2020-06-07 DIAGNOSIS — W19XXXA Unspecified fall, initial encounter: Secondary | ICD-10-CM | POA: Diagnosis not present

## 2020-06-07 DIAGNOSIS — N183 Chronic kidney disease, stage 3 unspecified: Secondary | ICD-10-CM | POA: Diagnosis not present

## 2020-06-07 DIAGNOSIS — I872 Venous insufficiency (chronic) (peripheral): Secondary | ICD-10-CM | POA: Diagnosis not present

## 2020-06-07 DIAGNOSIS — M1711 Unilateral primary osteoarthritis, right knee: Secondary | ICD-10-CM | POA: Diagnosis not present

## 2020-06-07 LAB — LIPID PANEL (REFL)
Cholesterol: 141 mg/dL (ref <200)
HDL: 58 mg/dL (ref >=40)
LDL Cholesterol (Calc): 64 mg/dL (calc)
Non-HDL Cholesterol (Calc): 83 mg/dL (calc) (ref <130)
Total CHOL/HDL Ratio: 2.4 (calc) (ref <5.0)
Triglycerides: 104 mg/dL (ref <150)

## 2020-06-07 LAB — CBC WITH DIFFERENTIAL/PLATELET
Absolute Monocytes: 482 cells/uL (ref 200–950)
Basophils Absolute: 31 cells/uL (ref 0–200)
Basophils Relative: 0.5 %
Eosinophils Absolute: 140 cells/uL (ref 15–500)
Eosinophils Relative: 2.3 %
HCT: 30.2 % — ABNORMAL LOW (ref 38.5–50.0)
Hemoglobin: 9.7 g/dL — ABNORMAL LOW (ref 13.2–17.1)
Lymphs Abs: 1452 cells/uL (ref 850–3900)
MCH: 30.9 pg (ref 27.0–33.0)
MCHC: 32.1 g/dL (ref 32.0–36.0)
MCV: 96.2 fL (ref 80.0–100.0)
MPV: 9.9 fL (ref 7.5–12.5)
Monocytes Relative: 7.9 %
Neutro Abs: 3996 cells/uL (ref 1500–7800)
Neutrophils Relative %: 65.5 %
Platelets: 245 10*3/uL (ref 140–400)
RBC: 3.14 10*6/uL — ABNORMAL LOW (ref 4.20–5.80)
RDW: 13.4 % (ref 11.0–15.0)
Total Lymphocyte: 23.8 %
WBC: 6.1 10*3/uL (ref 3.8–10.8)

## 2020-06-07 LAB — COMPLETE METABOLIC PANEL WITH GFR
AG Ratio: 1.4 (calc) (ref 1.0–2.5)
ALT: 13 U/L (ref 9–46)
AST: 18 U/L (ref 10–35)
Albumin: 3.6 g/dL (ref 3.6–5.1)
Alkaline phosphatase (APISO): 80 U/L (ref 35–144)
BUN: 25 mg/dL (ref 7–25)
CO2: 29 mmol/L (ref 20–32)
Calcium: 8.7 mg/dL (ref 8.6–10.3)
Chloride: 105 mmol/L (ref 98–110)
Creat: 1.06 mg/dL (ref 0.70–1.11)
GFR, Est African American: 67 mL/min/{1.73_m2} (ref >=60)
GFR, Est Non African American: 58 mL/min/{1.73_m2} — ABNORMAL LOW (ref >=60)
Globulin: 2.6 g/dL (calc) (ref 1.9–3.7)
Glucose, Bld: 86 mg/dL (ref 65–99)
Potassium: 5 mmol/L (ref 3.5–5.3)
Sodium: 140 mmol/L (ref 135–146)
Total Bilirubin: 0.4 mg/dL (ref 0.2–1.2)
Total Protein: 6.2 g/dL (ref 6.1–8.1)

## 2020-06-07 NOTE — Progress Notes (Signed)
Did you get this [place]?

## 2020-06-07 NOTE — Progress Notes (Signed)
Orders have been signed and faxed to [facility] along with office notes. Confirmation fax has been received.

## 2020-06-08 ENCOUNTER — Ambulatory Visit: Payer: Medicare Other | Admitting: Family Medicine

## 2020-06-08 DIAGNOSIS — R531 Weakness: Secondary | ICD-10-CM | POA: Diagnosis not present

## 2020-06-08 DIAGNOSIS — W19XXXA Unspecified fall, initial encounter: Secondary | ICD-10-CM | POA: Diagnosis not present

## 2020-06-08 DIAGNOSIS — M1711 Unilateral primary osteoarthritis, right knee: Secondary | ICD-10-CM | POA: Diagnosis not present

## 2020-06-08 DIAGNOSIS — N183 Chronic kidney disease, stage 3 unspecified: Secondary | ICD-10-CM | POA: Diagnosis not present

## 2020-06-08 DIAGNOSIS — I872 Venous insufficiency (chronic) (peripheral): Secondary | ICD-10-CM | POA: Diagnosis not present

## 2020-06-08 DIAGNOSIS — I129 Hypertensive chronic kidney disease with stage 1 through stage 4 chronic kidney disease, or unspecified chronic kidney disease: Secondary | ICD-10-CM | POA: Diagnosis not present

## 2020-06-11 ENCOUNTER — Other Ambulatory Visit: Payer: Self-pay

## 2020-06-11 DIAGNOSIS — D649 Anemia, unspecified: Secondary | ICD-10-CM

## 2020-06-11 DIAGNOSIS — D619 Aplastic anemia, unspecified: Secondary | ICD-10-CM

## 2020-06-12 ENCOUNTER — Telehealth: Payer: Self-pay

## 2020-06-12 ENCOUNTER — Other Ambulatory Visit: Payer: Self-pay | Admitting: Internal Medicine

## 2020-06-12 DIAGNOSIS — N183 Chronic kidney disease, stage 3 unspecified: Secondary | ICD-10-CM | POA: Diagnosis not present

## 2020-06-12 DIAGNOSIS — W19XXXA Unspecified fall, initial encounter: Secondary | ICD-10-CM | POA: Diagnosis not present

## 2020-06-12 DIAGNOSIS — M1711 Unilateral primary osteoarthritis, right knee: Secondary | ICD-10-CM | POA: Diagnosis not present

## 2020-06-12 DIAGNOSIS — R053 Chronic cough: Secondary | ICD-10-CM

## 2020-06-12 DIAGNOSIS — I872 Venous insufficiency (chronic) (peripheral): Secondary | ICD-10-CM | POA: Diagnosis not present

## 2020-06-12 DIAGNOSIS — I129 Hypertensive chronic kidney disease with stage 1 through stage 4 chronic kidney disease, or unspecified chronic kidney disease: Secondary | ICD-10-CM | POA: Diagnosis not present

## 2020-06-12 DIAGNOSIS — R531 Weakness: Secondary | ICD-10-CM | POA: Diagnosis not present

## 2020-06-12 MED ORDER — PANTOPRAZOLE SODIUM 40 MG PO TBEC: DELAYED_RELEASE_TABLET | ORAL | 2 refills | Status: DC

## 2020-06-12 NOTE — Telephone Encounter (Signed)
°  LAST APPOINTMENT DATE: 06/06/2020   NEXT APPOINTMENT DATE:@11 /15/2021  MEDICATION: benzonatate (TESSALON) 100 MG capsule  PHARMACY: [place], [place], [address]  Comments: Patients daughter would like to know if Dr.Hunter would like for him to continue this medication as he was prescribed an inhaler by pulmonologist which has helped.   Please advise

## 2020-06-12 NOTE — Telephone Encounter (Signed)
Can refill tessalon if he finds helpful for cough- im also ok with him trialing off. Pulmonology should be able to refill inhaler

## 2020-06-12 NOTE — Telephone Encounter (Signed)
See below

## 2020-06-13 ENCOUNTER — Other Ambulatory Visit: Payer: Self-pay | Admitting: Internal Medicine

## 2020-06-13 MED ORDER — BENZONATATE 100 MG PO CAPS: 100.0000 mg | ORAL_CAPSULE | Freq: Two times a day (BID) | ORAL | 2 refills | Status: DC | PRN

## 2020-06-13 NOTE — Addendum Note (Signed)
Addended by: Thomes Cake on: 06/13/2020 08:14 AM   Modules accepted: Orders

## 2020-06-13 NOTE — Telephone Encounter (Signed)
Tessalon has been sent to the patient's pharmacy

## 2020-06-14 DIAGNOSIS — W19XXXA Unspecified fall, initial encounter: Secondary | ICD-10-CM | POA: Diagnosis not present

## 2020-06-14 DIAGNOSIS — M1711 Unilateral primary osteoarthritis, right knee: Secondary | ICD-10-CM | POA: Diagnosis not present

## 2020-06-14 DIAGNOSIS — R531 Weakness: Secondary | ICD-10-CM | POA: Diagnosis not present

## 2020-06-14 DIAGNOSIS — I872 Venous insufficiency (chronic) (peripheral): Secondary | ICD-10-CM | POA: Diagnosis not present

## 2020-06-14 DIAGNOSIS — I129 Hypertensive chronic kidney disease with stage 1 through stage 4 chronic kidney disease, or unspecified chronic kidney disease: Secondary | ICD-10-CM | POA: Diagnosis not present

## 2020-06-14 DIAGNOSIS — N183 Chronic kidney disease, stage 3 unspecified: Secondary | ICD-10-CM | POA: Diagnosis not present

## 2020-06-18 DIAGNOSIS — W19XXXA Unspecified fall, initial encounter: Secondary | ICD-10-CM | POA: Diagnosis not present

## 2020-06-18 DIAGNOSIS — I872 Venous insufficiency (chronic) (peripheral): Secondary | ICD-10-CM | POA: Diagnosis not present

## 2020-06-18 DIAGNOSIS — I129 Hypertensive chronic kidney disease with stage 1 through stage 4 chronic kidney disease, or unspecified chronic kidney disease: Secondary | ICD-10-CM | POA: Diagnosis not present

## 2020-06-18 DIAGNOSIS — M1711 Unilateral primary osteoarthritis, right knee: Secondary | ICD-10-CM | POA: Diagnosis not present

## 2020-06-18 DIAGNOSIS — N183 Chronic kidney disease, stage 3 unspecified: Secondary | ICD-10-CM | POA: Diagnosis not present

## 2020-06-18 DIAGNOSIS — R531 Weakness: Secondary | ICD-10-CM | POA: Diagnosis not present

## 2020-06-20 ENCOUNTER — Telehealth: Payer: Self-pay

## 2020-06-20 NOTE — Telephone Encounter (Signed)
Nurse Assessment Nurse: Ysidro Evert, RN, Levada Dy Date/Time Eilene Ghazi Time): 06/20/2020 10:38:21 AM Confirm and document reason for call. If symptomatic, describe symptoms. ---Caller states her patient is having abdominal pain today. No other symptoms Does the patient have any new or worsening symptoms? ---Yes Will a triage be completed? ---Yes Related visit to physician within the last 2 weeks? ---No Does the PT have any chronic conditions? (i.e. diabetes, asthma, this includes High risk factors for pregnancy, etc.) ---No Is this a behavioral health or substance abuse call? ---No Guidelines Guideline Title Affirmed Question Affirmed Notes Nurse Date/Time Eilene Ghazi Time) Abdominal Pain - Male [1] MILD-MODERATE pain AND [2] constant AND [3] present > 2 hours Ysidro Evert, RN, Levada Dy 06/20/2020 10:39:56 AM Disp. Time Eilene Ghazi Time) Disposition Final User 06/20/2020 10:37:12 AM Send to Urgent Queue Donato Heinz 06/20/2020 10:44:21 AM See HCP within 4 Hours (or PCP triage) Yes Ysidro Evert, RN, Levada Dy PLEASE NOTE: All timestamps contained within this report are represented as [place] Standard Time. CONFIDENTIALTY NOTICE: This fax transmission is intended only for the addressee. It contains information that is legally privileged, confidential or otherwise protected from use or disclosure. If you are not the intended recipient, you are strictly prohibited from reviewing, disclosing, copying using or disseminating any of this information or taking any action in reliance on or regarding this information. If you have received this fax in error, please notify us immediately by telephone so that we can arrange for its return to [place]. Phone: [phone], Toll-Free: [phone], Fax: [phone] Page: 2 of 2 Call Id: [id number] [place] Disagree/Comply Comply Caller Understands Yes PreDisposition Did not know what to do Care Advice Given Per Guideline SEE HCP (OR PCP TRIAGE) WITHIN 4 HOURS: NOTHING BY MOUTH: * Do  not eat or drink anything for now. CALL BACK IF: * You become worse CARE ADVICE given per Abdominal Pain, Male (Adult) guideline. Referrals REFERRED TO PCP OFFIC

## 2020-06-20 NOTE — Telephone Encounter (Signed)
Pt has ov on 06/21/20.

## 2020-06-20 NOTE — Progress Notes (Deleted)
Phone [phone] In person visit   Subjective:   Roger [name] is a 86 y.o. year old very pleasant male patient who presents for/with See problem oriented charting No chief complaint on file.   This visit occurred during the SARS-CoV-2 public health emergency.  Safety protocols were in place, including screening questions prior to the visit, additional usage of staff PPE, and extensive cleaning of exam room while observing appropriate contact time as indicated for disinfecting solutions.   Past Medical History-  Patient Active Problem List   Diagnosis Date Noted  . Dysphagia 06/06/2020  . Other emphysema ([place]) 05/14/2020  . Right hip pain 09/13/2018  . Laceration of scalp 10/17/2017  . Ascending aortic aneurysm ([place]) 10/17/2017  . Aortic atherosclerosis ([place]) 10/17/2017  . Bronchitis 08/29/2017  . Allergic conjunctivitis of both eyes 06/22/2017  . Onychomycosis 10/22/2016  . Diastolic dysfunction 81/15/7262  . Depression, major, single episode, complete remission ([place]) 07/05/2015  . History of pneumonia 03/10/2015  . Venous (peripheral) insufficiency 01/26/2015  . Chronic cough 10/11/2014  . CKD (chronic kidney disease), stage III ([place]) 07/13/2014  . Overactive bladder 07/13/2014  . Syncope 11/17/2013  . Macular degeneration 06/19/2011  . Osteoarthritis 06/05/2009  . CARCINOMA, SKIN, SQUAMOUS CELL 05/26/2008  . Hypertension 06/17/2007  . Hyperlipidemia 02/22/2007  . GERD (gastroesophageal reflux disease) 02/22/2007    Medications- reviewed and updated Current Outpatient Medications  Medication Sig Dispense Refill  . benzonatate (TESSALON) 100 MG capsule Take 1 capsule (100 mg total) by mouth 2 (two) times daily as needed for cough. 60 capsule 2  . furosemide (LASIX) 20 MG tablet Take 1 tablet (20 mg total) by mouth daily as needed (weight gain or increased leg swelling). 90 tablet 1  . INCRUSE ELLIPTA 62.5 MCG/INH AEPB TAKE 1 PUFF BY MOUTH EVERY DAY 30 each 5  .  lovastatin (MEVACOR) 40 MG tablet TAKE 1 TABLET BY MOUTH  DAILY 90 tablet 3  . Multiple Vitamins-Minerals (MULTIVITAMIN WITH MINERALS) tablet Take 1 tablet by mouth daily.    . mupirocin ointment (BACTROBAN) 2 % Apply 1 application topically 2 (two) times daily. 30 g 2  . pantoprazole (PROTONIX) 40 MG tablet Take 30- 60 min before your first and last meals of the day 60 tablet 2  . sertraline (ZOLOFT) 100 MG tablet Take 1 tablet (100 mg total) by mouth daily. 90 tablet 3  . UNABLE TO FIND Med Name: Med pass 120 mL by mouth twice daily for supplement     No current facility-administered medications for this visit.     Objective:  There were no vitals taken for this visit. Gen: NAD, resting comfortably CV: RRR no murmurs rubs or gallops Lungs: CTAB no crackles, wheeze, rhonchi Abdomen: soft/nontender/nondistended/normal bowel sounds. No rebound or guarding.  Ext: no edema Skin: warm, dry Neuro: grossly normal, moves all extremities  ***    Assessment and Plan  Chest Pain S:***  A/P: ***     Clemmons, Consuelo Pandy, MD    I know I have sent you a lot of these - Im pretty sure his was one of them -   My last hope for Home Health didn't work out. I am so sorry that it takes forever for places to decline them.   Want me to try [facility] ?   Its all [employer] - apparently they are bad payers for home health. This is getting ridiculous.   I reached out to the other referral coordinators and  everyone is having issues with them.   ***  No problem-specific Assessment & Plan notes found for this encounter.   Recommended follow up: ***No follow-ups on file. Future Appointments  Date Time Provider [place]  06/21/2020  2:20 PM [name], MD [facility]  07/09/2020  2:00 PM [facility] LAB [facility]  07/10/2020  1:15 PM Trula Slade, DPM [facility]  07/11/2020 11:00 AM [facility] CCM PHARMACIST [facility]    08/13/2020  2:00 PM Tanda Rockers, MD [facility] None  09/27/2020  1:00 PM Rankin, Clent Demark, MD [facility] None  10/16/2020  1:40 PM [name], MD [facility]    Lab/Order associations: No diagnosis found.  No orders of the defined types were placed in this encounter.   Time Spent: *** minutes of total time (3:06 PM***- 3:06 PM***) was spent on the date of the encounter performing the following actions: chart review prior to seeing the patient, obtaining history, performing a medically necessary exam, counseling on the treatment plan, placing orders, and documenting in our EHR.   Return precautions advised.  Clyde Lundborg, CMA

## 2020-06-21 ENCOUNTER — Ambulatory Visit: Payer: Medicare Other | Admitting: Family Medicine

## 2020-06-27 DIAGNOSIS — I872 Venous insufficiency (chronic) (peripheral): Secondary | ICD-10-CM | POA: Diagnosis not present

## 2020-06-27 DIAGNOSIS — R531 Weakness: Secondary | ICD-10-CM | POA: Diagnosis not present

## 2020-06-27 DIAGNOSIS — N183 Chronic kidney disease, stage 3 unspecified: Secondary | ICD-10-CM | POA: Diagnosis not present

## 2020-06-27 DIAGNOSIS — W19XXXA Unspecified fall, initial encounter: Secondary | ICD-10-CM | POA: Diagnosis not present

## 2020-06-27 DIAGNOSIS — M1711 Unilateral primary osteoarthritis, right knee: Secondary | ICD-10-CM | POA: Diagnosis not present

## 2020-06-27 DIAGNOSIS — I129 Hypertensive chronic kidney disease with stage 1 through stage 4 chronic kidney disease, or unspecified chronic kidney disease: Secondary | ICD-10-CM | POA: Diagnosis not present

## 2020-06-28 ENCOUNTER — Other Ambulatory Visit: Payer: Self-pay | Admitting: Family Medicine

## 2020-07-01 ENCOUNTER — Telehealth: Payer: Self-pay | Admitting: Physician Assistant

## 2020-07-01 NOTE — Telephone Encounter (Signed)
Called to discuss the homebound Covid-19 vaccination initiative with the patient and/or caregiver.   Message left to call back.  Jamicheal Heard PA-C  [facility]     

## 2020-07-01 NOTE — Telephone Encounter (Signed)
I connected by phone with [facility] and/or patient's caregiver on 07/01/2020 at 1:59 PM to discuss the potential vaccination through our Homebound vaccination initiative.   Prevaccination Checklist for COVID-19 Vaccines  1.  Are you feeling sick today? no  2.  Have you ever received a dose of a COVID-19 vaccine?  yes      If yes, which one? Pfizer   3.  Have you ever had an allergic reaction: (This would include a severe reaction [ e.g., anaphylaxis] that required treatment with epinephrine or EpiPen or that caused you to go to the hospital.  It would also include an allergic reaction that occurred within 4 hours that caused hives, swelling, or respiratory distress, including wheezing.) A.  A previous dose of COVID-19 vaccine. no  B.  A vaccine or injectable therapy that contains multiple components, one of which is a COVID-19 vaccine component, but it is not known which component elicited the immediate reaction. no  C.  Are you allergic to polyethylene glycol? no  D. Are you allergic to Polysorbate, which is found in some vaccines, film coated tablets and intravenous steroids?  no   4.  Have you ever had an allergic reaction to another vaccine (other than COVID-19 vaccine) or an injectable medication? (This would include a severe reaction [ e.g., anaphylaxis] that required treatment with epinephrine or EpiPen or that caused you to go to the hospital.  It would also include an allergic reaction that occurred within 4 hours that caused hives, swelling, or respiratory distress, including wheezing.)  no   5.  Have you ever had a severe allergic reaction (e.g., anaphylaxis) to something other than a component of the COVID-19 vaccine, or any vaccine or injectable medication?  This would include food, pet, venom, environmental, or oral medication allergies.  no   6.  Have you received any vaccine in the last 14 days? no   7.  Have you ever had a positive test for COVID-19 or has a doctor ever  told you that you had COVID-19?  no   8.  Have you received passive antibody therapy (monoclonal antibodies or convalescent serum) as a treatment for COVID-19? no   9.  Do you have a weakened immune system caused by something such as HIV infection or cancer or do you take immunosuppressive drugs or therapies?  no   10.  Do you have a bleeding disorder or are you taking a blood thinner? no   11.  Are you pregnant or breast-feeding? no   12.  Do you have dermal fillers? no   __________________   This patient is a 86 y.o. male that meets the FDA criteria to receive homebound vaccination. Patient or parent/caregiver understands they have the option to accept or refuse homebound vaccination.  Patient passed the pre-screening checklist and would like to proceed with homebound vaccination.  Based on questionnaire above, I recommend the patient be observed for 15 minutes.  There are an estimated 2 other household members/caregivers who are also interested in receiving the vaccine. Wife and daughter. Wife also listed on spreadsheet, Kerri Asche  I will send the patient's information to our scheduling team who will reach out to schedule the patient and potential caregiver/family members for homebound vaccination.   Angelena Form 07/01/2020 1:59 PM

## 2020-07-03 ENCOUNTER — Ambulatory Visit: Payer: Medicare Other | Attending: Family Medicine

## 2020-07-03 DIAGNOSIS — W19XXXA Unspecified fall, initial encounter: Secondary | ICD-10-CM | POA: Diagnosis not present

## 2020-07-03 DIAGNOSIS — I129 Hypertensive chronic kidney disease with stage 1 through stage 4 chronic kidney disease, or unspecified chronic kidney disease: Secondary | ICD-10-CM | POA: Diagnosis not present

## 2020-07-03 DIAGNOSIS — R531 Weakness: Secondary | ICD-10-CM | POA: Diagnosis not present

## 2020-07-03 DIAGNOSIS — M1711 Unilateral primary osteoarthritis, right knee: Secondary | ICD-10-CM | POA: Diagnosis not present

## 2020-07-03 DIAGNOSIS — Z23 Encounter for immunization: Secondary | ICD-10-CM

## 2020-07-03 DIAGNOSIS — N183 Chronic kidney disease, stage 3 unspecified: Secondary | ICD-10-CM | POA: Diagnosis not present

## 2020-07-03 DIAGNOSIS — I872 Venous insufficiency (chronic) (peripheral): Secondary | ICD-10-CM | POA: Diagnosis not present

## 2020-07-03 NOTE — Progress Notes (Signed)
   Covid-19 Vaccination Clinic  Name:  KASHDEN DEBOY    MRN: [id number] DOB: 28-Dec-1919  07/03/2020  Mr. Battiste was observed post Covid-19 immunization for 15 minutes without incident. He was provided with Vaccine Information Sheet and instruction to access the V-Safe system.   Mr. Spruce was instructed to call 911 with any severe reactions post vaccine: [name] Difficulty breathing  . Swelling of face and throat  . A fast heartbeat  . A bad rash all over body  . Dizziness and weakness

## 2020-07-09 ENCOUNTER — Other Ambulatory Visit: Payer: Self-pay

## 2020-07-09 ENCOUNTER — Other Ambulatory Visit: Payer: Medicare Other

## 2020-07-09 DIAGNOSIS — D649 Anemia, unspecified: Secondary | ICD-10-CM

## 2020-07-09 LAB — CBC WITH DIFFERENTIAL/PLATELET
Absolute Monocytes: 563 cells/uL (ref 200–950)
Basophils Absolute: 59 cells/uL (ref 0–200)
Basophils Relative: 0.7 %
Eosinophils Absolute: 118 cells/uL (ref 15–500)
Eosinophils Relative: 1.4 %
HCT: 31.6 % — ABNORMAL LOW (ref 38.5–50.0)
Hemoglobin: 10.4 g/dL — ABNORMAL LOW (ref 13.2–17.1)
Lymphs Abs: 2243 cells/uL (ref 850–3900)
MCH: 31.4 pg (ref 27.0–33.0)
MCHC: 32.9 g/dL (ref 32.0–36.0)
MCV: 95.5 fL (ref 80.0–100.0)
MPV: 9.6 fL (ref 7.5–12.5)
Monocytes Relative: 6.7 %
Neutro Abs: 5418 cells/uL (ref 1500–7800)
Neutrophils Relative %: 64.5 %
Platelets: 237 10*3/uL (ref 140–400)
RBC: 3.31 10*6/uL — ABNORMAL LOW (ref 4.20–5.80)
RDW: 12.7 % (ref 11.0–15.0)
Total Lymphocyte: 26.7 %
WBC: 8.4 10*3/uL (ref 3.8–10.8)

## 2020-07-10 ENCOUNTER — Ambulatory Visit: Payer: Medicare Other | Admitting: Podiatry

## 2020-07-11 ENCOUNTER — Ambulatory Visit: Payer: Medicare Other

## 2020-07-11 DIAGNOSIS — I10 Essential (primary) hypertension: Secondary | ICD-10-CM

## 2020-07-11 DIAGNOSIS — E785 Hyperlipidemia, unspecified: Secondary | ICD-10-CM

## 2020-07-11 NOTE — Progress Notes (Signed)
Chronic Care Management Pharmacy  Name: Roger [name] MRN: [id number]   DOB: 08/27/1919  Chief Complaint/ HPI Roger [name], 86 y.o., male, presents for their initial CCM visit with the clinical pharmacist via telephone due to COVID-19 pandemic. Pt is hard of hearing and is accompanied by wife, Roger [name].   PCP : [name], MD Encounter Diagnoses  Name Primary?   Hyperlipidemia, unspecified hyperlipidemia type Yes   Primary hypertension      Office Visits:  06/06/2020 (PCP): Previous urinary retention on oxybutynin. Referred to urology, considering trial of mybetriq. BP tx w/ prn lasix  02/21/2020 (PCP): cholesterol recheck 3 months/CPE. Voltaren otc gel for knee pain. Consider urology referral if wanting pharmacotherapy for urinary urgency/incontinence    Patient Active Problem List   Diagnosis Date Noted   Dysphagia 06/06/2020   Other emphysema ([place]) 05/14/2020   Right hip pain 09/13/2018   Laceration of scalp 10/17/2017   Ascending aortic aneurysm ([place]) 10/17/2017   Aortic atherosclerosis ([place]) 10/17/2017   Bronchitis 08/29/2017   Allergic conjunctivitis of both eyes 06/22/2017   Onychomycosis 40/98/1191   Diastolic dysfunction 47/82/9562   Depression, major, single episode, complete remission ([place]) 07/05/2015   History of pneumonia 03/10/2015   Venous (peripheral) insufficiency 01/26/2015   Chronic cough 10/11/2014   CKD (chronic kidney disease), stage III ([place]) 07/13/2014   Overactive bladder 07/13/2014   Syncope 11/17/2013   Macular degeneration 06/19/2011   Osteoarthritis 06/05/2009   CARCINOMA, SKIN, SQUAMOUS CELL 05/26/2008   Hypertension 06/17/2007   Hyperlipidemia 02/22/2007   GERD (gastroesophageal reflux disease) 02/22/2007   Past Surgical History:  Procedure Laterality Date   LAMINECTOMY  1988   Family History  Problem Relation Age of Onset   Breast cancer Daughter    Allergies  Allergen Reactions   Amlodipine  Besylate     REACTION: swelling of feet   Aspirin     REACTION: GI upset can tolerate ecotrin   Hydrocodone     GI upset   Outpatient Encounter Medications as of 07/11/2020  Medication Sig   benzonatate (TESSALON) 100 MG capsule Take 1 capsule (100 mg total) by mouth 2 (two) times daily as needed for cough.   furosemide (LASIX) 20 MG tablet TAKE 1 TABLET BY MOUTH  DAILY AS NEEDED FOR WEIGHT  GAIN OR INCREASED LEG  SWELLING   INCRUSE ELLIPTA 62.5 MCG/INH AEPB TAKE 1 PUFF BY MOUTH EVERY DAY   lovastatin (MEVACOR) 40 MG tablet TAKE 1 TABLET BY MOUTH  DAILY   Multiple Vitamins-Minerals (MULTIVITAMIN WITH MINERALS) tablet Take 1 tablet by mouth daily.   mupirocin ointment (BACTROBAN) 2 % Apply 1 application topically 2 (two) times daily.   pantoprazole (PROTONIX) 40 MG tablet Take 30- 60 min before your first and last meals of the day   sertraline (ZOLOFT) 100 MG tablet Take 1 tablet (100 mg total) by mouth daily.   UNABLE TO FIND Med Name: Med pass 120 mL by mouth twice daily for supplement   [DISCONTINUED] diltiazem (CARDIZEM CD) 180 MG 24 hr capsule Take 180 mg by mouth daily.    [DISCONTINUED] hydrochlorothiazide (MICROZIDE) 12.5 MG capsule Take 1 capsule (12.5 mg total) by mouth daily.   No facility-administered encounter medications on file as of 07/11/2020.   Patient Care Team    Relationship Specialty Notifications Start End  [name], MD PCP - General Family Medicine  05/30/14   Trula Slade, DPM Consulting Physician Podiatry  08/22/19   Tanda Rockers, MD Consulting Physician  Pulmonary Disease  08/22/19   Madelin Rear, [facility] Pharmacist Pharmacist  04/04/20    Comment: [phone]   Current Diagnosis/Assessment: Goals Addressed            This Visit's Progress    PharmD Care Plan       CARE PLAN ENTRY (see longitudinal plan of care for additional care plan information)  Current Barriers:   Chronic Disease Management support, education, and  care coordination needs related to Hypertension and Hyperlipidemia   Hypertension BP Readings from Last 3 Encounters:  06/06/20 124/76  05/14/20 100/64  03/27/20 129/73    Pharmacist Clinical Goal(s): o Over the next 365 days, patient will work with PharmD and providers to maintain BP goal <130/80  Current regimen:  o Furosemide 20 mg once daily as needed  Interventions: o Medications reviewed for safety/side effects - no concerns at this time  Patient self care activities - Over the next 365 days, patient will: o Check BP at least once every 1-2 weeks, document, and provide at future appointments o Ensure daily salt intake < 2300 mg/day  Hyperlipidemia Lab Results  Component Value Date/Time   LDLCALC 64 06/06/2020 03:29 PM   LDLDIRECT 77.0 10/16/2016 11:58 AM    Pharmacist Clinical Goal(s): o Over the next 365 days, patient will work with PharmD and providers to maintain LDL goal < 70  Current regimen:  o Lovastatin 40 mg once daily.  Interventions: o Medications reviewed for safety/side effects - no concerns at this time.  Patient self care activities - Over the next 365 days, patient will: o Continue current management  Medication management  Pharmacist Clinical Goal(s): o Over the next 365 days, patient will work with PharmD and providers to maintain optimal medication adherence  Current pharmacy: [facility] mail order  Interventions o Comprehensive medication review performed. o Continue current medication management strategy  Patient self care activities - Over the next 365 days, patient will: o Take medications as prescribed o Report any questions or concerns to PharmD and/or provider(s) Initial goal documentation.      Hypertension   BP goal <130/80  BP Readings from Last 3 Encounters:  06/06/20 124/76  05/14/20 100/64  03/27/20 129/73   Denies dizziness or side effects. Patient states no issues with blood pressure, unable to confirm if testing  at home. Patient is currently at goal on the following medications:   Furosemide 20 mg as needed for weight gain or increased leg swelling  Unable to review diet and exercise with patient.  Plan  Continue current medications.  Hyperlipidemia   LDL goal < 70  Lipid Panel     Component Value Date/Time   CHOL 141 06/06/2020 1529   TRIG 104 06/06/2020 1529   HDL 58 06/06/2020 1529   LDLCALC 64 06/06/2020 1529   LDLDIRECT 77.0 10/16/2016 1158    Hepatic Function Latest Ref Rng & Units 06/06/2020 01/02/2020 05/09/2019  Total Protein 6.1 - 8.1 g/dL 6.2 6.3 6.5  Albumin 3.5 - 5.2 g/dL - 3.7 4.0  AST 10 - 35 U/L 18 23 20   ALT 9 - 46 U/L 13 17 13   Alk Phosphatase 39 - 117 U/L - 85 70  Total Bilirubin 0.2 - 1.2 mg/dL 0.4 0.4 0.6  Bilirubin, Direct 0.0 - 0.3 mg/dL - - -    The ASCVD Risk score (Goodrich., et al., 2013) failed to calculate for the following reasons:   The 2013 ASCVD risk score is only valid for ages 20 to 65  Pt denies any side effects today. Patient is currently at goal the following medications:   Lovastatin 40 mg once daily  Plan  Continue current medications.  Depression   PHQ9 SCORE ONLY 06/06/2020 01/02/2020 12/01/2019  PHQ-9 Total Score 2 8 1    Denies side effects, feels that his mood/energy has been great lately.  Unable to perform PHQ-9 today. Patient is currently on the following medications:   Sertraline 100 mg once daily   Plan  Continue current medications.  Vaccines   Immunization History  Administered Date(s) Administered   Fluad Quad(high Dose 65+) 05/09/2019, 06/06/2020   Influenza Split 05/24/2012   Influenza Whole 06/17/2007, 05/26/2008, 06/05/2009, 05/06/2010   Influenza, High Dose Seasonal PF 06/10/2013, 07/13/2014, 05/23/2016, 05/25/2017, 05/17/2018   Influenza,inj,Quad PF,6+ Mos 07/05/2015   PFIZER SARS-COV-2 Vaccination 10/16/2019, 11/09/2019, 07/03/2020   PPD Test 03/12/2015   Pneumococcal Conjugate-13 11/28/2014    Pneumococcal Polysaccharide-23 08/25/1994, 06/17/2007   Td 08/25/1992, 02/05/2009, 03/15/2018   Zoster 12/13/2013   Reviewed and discussed patient's vaccination history.  Unable to confirm if received newer shingles vaccine.   Plan  Recommended patient receive Shingrix if appropriate.   Medication Management / Care Coordination   Receives prescription medications from:  [facility], [address], [address] [place], [address] [place] [address] Phone: [phone] Fax: [phone]  [id number] [place], [address]: [phone]: [phone]  Plan  Continue current medication management strategy. ___________________________ SDOH (Social Determinants of Health) assessments performed: Yes.  Future Appointments  Date Time Provider [place] 07/30/2020  1:15 PM WTrula Slade [facility]  08/27/2020  2:45 PM WMelvyn NovasMChristena Deem MD L[facility] None  09/27/2020  1:00 PM [place], GClent Demark MD RDE-RDE None  10/16/2020  1:40 PM HMarin Olp MD [facility]   Visit follow-up:   CPA follow-up: 11 month gen/adh. [facility]   [place]: 12 month telephone visit or sooner if patient assistance desired pending urology f/u (if mybetriq prescribed).  JMadelin Rear Pharm.D., BCGP Clinical Pharmacist  Primary Care ([phone]

## 2020-07-11 NOTE — Patient Instructions (Signed)
Please review care plan below and call me at [phone] with any questions!  Thank you, Edyth Gunnels., Clinical Pharmacist  Goals Addressed            This Visit's Progress   . PharmD Care Plan       CARE PLAN ENTRY (see longitudinal plan of care for additional care plan information)  Current Barriers:  . Chronic Disease Management support, education, and care coordination needs related to Hypertension and Hyperlipidemia   Hypertension BP Readings from Last 3 Encounters:  06/06/20 124/76  05/14/20 100/64  03/27/20 129/73   . Pharmacist Clinical Goal(s): o Over the next 365 days, patient will work with PharmD and providers to maintain BP goal <130/80 . Current regimen:  o Furosemide 20 mg once daily as needed . Interventions: o Medications reviewed for safety/side effects - no concerns at this time . Patient self care activities - Over the next 365 days, patient will: o Check BP at least once every 1-2 weeks, document, and provide at future appointments o Ensure daily salt intake < 2300 mg/day  Hyperlipidemia Lab Results  Component Value Date/Time   LDLCALC 64 06/06/2020 03:29 PM   LDLDIRECT 77.0 10/16/2016 11:58 AM   . Pharmacist Clinical Goal(s): o Over the next 365 days, patient will work with PharmD and providers to maintain LDL goal < 70 . Current regimen:  o Lovastatin 40 mg once daily. . Interventions: o Medications reviewed for safety/side effects - no concerns at this time. . Patient self care activities - Over the next 365 days, patient will: o Continue current management  Medication management . Pharmacist Clinical Goal(s): o Over the next 365 days, patient will work with PharmD and providers to maintain optimal medication adherence . Current pharmacy: [facility] . Interventions o Comprehensive medication review performed. o Continue current medication management strategy . Patient self care activities - Over the next 365 days, patient  will: o Take medications as prescribed o Report any questions or concerns to PharmD and/or provider(s) Initial goal documentation.      The patient verbalized understanding of instructions provided today and agreed to receive a mailed copy of patient instruction and/or educational materials. Telephone follow up appointment with pharmacy team member scheduled for: See next appointment with "Care Management Staff" under "What's Next" below.   Roger [name], Pharm.D., BCGP Clinical Pharmacist [facility] [phone]  High Cholesterol  High cholesterol is a condition in which the blood has high levels of a white, waxy, fat-like substance (cholesterol). The human body needs small amounts of cholesterol. The liver makes all the cholesterol that the body needs. Extra (excess) cholesterol comes from the food that we eat. Cholesterol is carried from the liver by the blood through the blood vessels. If you have high cholesterol, deposits (plaques) may build up on the walls of your blood vessels (arteries). Plaques make the arteries narrower and stiffer. Cholesterol plaques increase your risk for heart attack and stroke. Work with your health care provider to keep your cholesterol levels in a healthy range. What increases the risk? This condition is more likely to develop in people who:  Eat foods that are high in animal fat (saturated fat) or cholesterol.  Are overweight.  Are not getting enough exercise.  Have a family history of high cholesterol. What are the signs or symptoms? There are no symptoms of this condition. How is this diagnosed? This condition may be diagnosed from the results of a blood test.  If you are  older than age 49, your health care provider may check your cholesterol every 4-6 years.  You may be checked more often if you already have high cholesterol or other risk factors for heart disease. The blood test for cholesterol measures:  "Bad" cholesterol (LDL  cholesterol). This is the main type of cholesterol that causes heart disease. The desired level for LDL is less than 100.  "Good" cholesterol (HDL cholesterol). This type helps to protect against heart disease by cleaning the arteries and carrying the LDL away. The desired level for HDL is 60 or higher.  Triglycerides. These are fats that the body can store or burn for energy. The desired number for triglycerides is lower than 150.  Total cholesterol. This is a measure of the total amount of cholesterol in your blood, including LDL cholesterol, HDL cholesterol, and triglycerides. A healthy number is less than 200. How is this treated? This condition is treated with diet changes, lifestyle changes, and medicines. Diet changes  This may include eating more whole grains, fruits, vegetables, nuts, and fish.  This may also include cutting back on red meat and foods that have a lot of added sugar. Lifestyle changes  Changes may include getting at least 40 minutes of aerobic exercise 3 times a week. Aerobic exercises include walking, biking, and swimming. Aerobic exercise along with a healthy diet can help you maintain a healthy weight.  Changes may also include quitting smoking. Medicines  Medicines are usually given if diet and lifestyle changes have failed to reduce your cholesterol to healthy levels.  Your health care provider may prescribe a statin medicine. Statin medicines have been shown to reduce cholesterol, which can reduce the risk of heart disease. Follow these instructions at home: Eating and drinking If told by your health care provider:  Eat chicken (without skin), fish, veal, shellfish, ground [place] breast, and round or loin cuts of red meat.  Do not eat fried foods or fatty meats, such as hot dogs and salami.  Eat plenty of fruits, such as apples.  Eat plenty of vegetables, such as broccoli, potatoes, and carrots.  Eat beans, peas, and lentils.  Eat grains such as  barley, rice, couscous, and bulgur wheat.  Eat pasta without cream sauces.  Use skim or nonfat milk, and eat low-fat or nonfat yogurt and cheeses.  Do not eat or drink whole milk, cream, ice cream, egg yolks, or hard cheeses.  Do not eat stick margarine or tub margarines that contain trans fats (also called partially hydrogenated oils).  Do not eat saturated tropical oils, such as coconut oil and palm oil.  Do not eat cakes, cookies, crackers, or other baked goods that contain trans fats.  General instructions  Exercise as directed by your health care provider. Increase your activity level with activities such as gardening, walking, and taking the stairs.  Take over-the-counter and prescription medicines only as told by your health care provider.  Do not use any products that contain nicotine or tobacco, such as cigarettes and e-cigarettes. If you need help quitting, ask your health care provider.  Keep all follow-up visits as told by your health care provider. This is important. Contact a health care provider if:  You are struggling to maintain a healthy diet or weight.  You need help to start on an exercise program.  You need help to stop smoking. Get help right away if:  You have chest pain.  You have trouble breathing. This information is not intended to replace advice given  to you by your health care provider. Make sure you discuss any questions you have with your health care provider. Document Revised: 08/14/2017 Document Reviewed: 02/09/2016 Elsevier Patient Education  [place].

## 2020-07-12 ENCOUNTER — Telehealth: Payer: Self-pay

## 2020-07-12 NOTE — Telephone Encounter (Signed)
Would schedule tylenol 650 mg every 6 hours until visit

## 2020-07-12 NOTE — Telephone Encounter (Signed)
See below

## 2020-07-12 NOTE — Telephone Encounter (Signed)
Pt.'s caregiver called stating that the pt has had a cough and his throat is very very sore. Pt is set up for a virtual appt tomorrow with Aldona Bar. Caregiver wants to know if there is anything she can be doing now to help with his throat, as he is in a lot of pain.

## 2020-07-13 ENCOUNTER — Other Ambulatory Visit: Payer: Self-pay

## 2020-07-13 ENCOUNTER — Encounter: Payer: Self-pay | Admitting: Physician Assistant

## 2020-07-13 ENCOUNTER — Telehealth (INDEPENDENT_AMBULATORY_CARE_PROVIDER_SITE_OTHER): Payer: Medicare Other | Admitting: Physician Assistant

## 2020-07-13 VITALS — Ht 64.0 in | Wt 120.0 lb

## 2020-07-13 DIAGNOSIS — R059 Cough, unspecified: Secondary | ICD-10-CM

## 2020-07-13 MED ORDER — PREDNISONE 20 MG PO TABS: 40.0000 mg | ORAL_TABLET | Freq: Every day | ORAL | 0 refills | Status: DC

## 2020-07-13 MED ORDER — AZITHROMYCIN 250 MG PO TABS: ORAL_TABLET | ORAL | 0 refills | Status: DC

## 2020-07-13 NOTE — Telephone Encounter (Signed)
Called and lmtcb.

## 2020-07-13 NOTE — Progress Notes (Signed)
Virtual Visit via Video   I connected with Roger [name] on 07/13/20 at 12:00 PM EST by a video enabled telemedicine application and verified that I am speaking with the correct person using two identifiers. Location patient: Home Location provider: [facility], Office Persons participating in the virtual visit: Roger [name], [place] PA-C, Roger Pickler, LPN   I discussed the limitations of evaluation and management by telemedicine and the availability of in person appointments. The patient expressed understanding and agreed to proceed.  I acted as a Education administrator for [employer], [employer], LPN   Subjective:   HPI:   Sore throat Pt c/o sore throat x one week. Endorses chest congestion coughing and expectorating brown sputum. Pt has been on Mucinex DM syrup. Has normal appetite. He has been fully covid vaccinated. He is very cautious and rarely goes out. Does have history of PNA, emphysema and bronchitis. Uses inhalers religiously.  Caregiver providing information.  Denies: SOB or difficulty breathing, fever, chills, malaise, decreased UOP  ROS: See pertinent positives and negatives per HPI.  Patient Active Problem List   Diagnosis Date Noted  . Dysphagia 06/06/2020  . Other emphysema ([place]) 05/14/2020  . Right hip pain 09/13/2018  . Laceration of scalp 10/17/2017  . Ascending aortic aneurysm ([place]) 10/17/2017  . Aortic atherosclerosis ([place]) 10/17/2017  . Bronchitis 08/29/2017  . Allergic conjunctivitis of both eyes 06/22/2017  . Onychomycosis 10/22/2016  . Diastolic dysfunction 72/62/0355  . Depression, major, single episode, complete remission ([place]) 07/05/2015  . History of pneumonia 03/10/2015  . Venous (peripheral) insufficiency 01/26/2015  . Chronic cough 10/11/2014  . CKD (chronic kidney disease), stage III ([place]) 07/13/2014  . Overactive bladder 07/13/2014  . Syncope 11/17/2013  . Macular degeneration 06/19/2011  . Osteoarthritis 06/05/2009    . CARCINOMA, SKIN, SQUAMOUS CELL 05/26/2008  . Hypertension 06/17/2007  . Hyperlipidemia 02/22/2007  . GERD (gastroesophageal reflux disease) 02/22/2007    Social History   Tobacco Use  . Smoking status: Former Smoker    Packs/day: 1.00    Years: 20.00    Pack years: 20.00    Types: Cigars, Cigarettes, Pipe    Quit date: 08/25/2014    Years since quitting: 5.8  . Smokeless tobacco: Never Used  Substance Use Topics  . Alcohol use: No    Current Outpatient Medications:  .  benzonatate (TESSALON) 100 MG capsule, Take 1 capsule (100 mg total) by mouth 2 (two) times daily as needed for cough., Disp: 60 capsule, Rfl: 2 .  furosemide (LASIX) 20 MG tablet, TAKE 1 TABLET BY MOUTH  DAILY AS NEEDED FOR WEIGHT  GAIN OR INCREASED LEG  SWELLING, Disp: 90 tablet, Rfl: 3 .  INCRUSE ELLIPTA 62.5 MCG/INH AEPB, TAKE 1 PUFF BY MOUTH EVERY DAY, Disp: 30 each, Rfl: 5 .  lovastatin (MEVACOR) 40 MG tablet, TAKE 1 TABLET BY MOUTH  DAILY, Disp: 90 tablet, Rfl: 3 .  Multiple Vitamins-Minerals (MULTIVITAMIN WITH MINERALS) tablet, Take 1 tablet by mouth daily., Disp: , Rfl:  .  mupirocin ointment (BACTROBAN) 2 %, Apply 1 application topically 2 (two) times daily., Disp: 30 g, Rfl: 2 .  pantoprazole (PROTONIX) 40 MG tablet, Take 30- 60 min before your first and last meals of the day, Disp: 60 tablet, Rfl: 2 .  sertraline (ZOLOFT) 100 MG tablet, Take 1 tablet (100 mg total) by mouth daily., Disp: 90 tablet, Rfl: 3 .  UNABLE TO FIND, Med Name: Med pass 120 mL by mouth twice daily for supplement,  Disp: , Rfl:  .  azithromycin (ZITHROMAX) 250 MG tablet, Take two tablets on day 1, then one daily x 4 days, Disp: 6 tablet, Rfl: 0 .  predniSONE (DELTASONE) 20 MG tablet, Take 2 tablets (40 mg total) by mouth daily., Disp: 10 tablet, Rfl: 0  Allergies  Allergen Reactions  . Amlodipine Besylate     REACTION: swelling of feet  . Aspirin     REACTION: GI upset can tolerate ecotrin  . Hydrocodone     GI upset     Objective:   VITALS: Per patient if applicable, see vitals. GENERAL: Alert, appears well and in no acute distress. HEENT: Atraumatic, conjunctiva clear, no obvious abnormalities on inspection of external nose and ears. NECK: Normal movements of the head and neck. CARDIOPULMONARY: No increased WOB. Speaking in clear sentences. I:E ratio WNL.  MS: Moves all visible extremities without noticeable abnormality. PSYCH: Pleasant and cooperative, well-groomed. Speech normal rate and rhythm. Affect is appropriate. Insight and judgement are appropriate. Attention is focused, linear, and appropriate.  NEURO: CN grossly intact. Oriented as arrived to appointment on time with no prompting. Moves both UE equally.  SKIN: No obvious lesions, wounds, erythema, or cyanosis noted on face or hands.  Assessment and Plan:   [name] was seen today for sore throat.  Diagnoses and all orders for this visit:  Cough -     Novel Coronavirus, NAA ([facility])  Other orders -     azithromycin (ZITHROMAX) 250 MG tablet; Take two tablets on day 1, then one daily x 4 days -     predniSONE (DELTASONE) 20 MG tablet; Take 2 tablets (40 mg total) by mouth daily.   No red flags on discussion.  He is in no distress on exam and has normal, non-labored breathing and speaking. Possible bronchitis. Will initiate oral prednisone and zpack per orders. Will have patient come to parking lot this pm for covid test, he is in agreement. Discussed taking medications as prescribed.   Given age and chronic medical issues, discussed with caregiver that if patient has any worsening symptoms, to proceed to ER or urgent care over weekend.  I discussed the assessment and treatment plan with the patient. The patient was provided an opportunity to ask questions and all were answered. The patient agreed with the plan and demonstrated an understanding of the instructions.   The patient was advised to call back or seek an in-person evaluation  if the symptoms worsen or if the condition fails to improve as anticipated.   CMA or LPN served as scribe during this visit. History, Physical, and Plan performed by medical provider. The above documentation has been reviewed and is accurate and complete.  [place], [place] 07/13/2020

## 2020-07-24 ENCOUNTER — Other Ambulatory Visit: Payer: Self-pay | Admitting: *Deleted

## 2020-07-24 DIAGNOSIS — I10 Essential (primary) hypertension: Secondary | ICD-10-CM

## 2020-07-24 DIAGNOSIS — E785 Hyperlipidemia, unspecified: Secondary | ICD-10-CM

## 2020-07-24 DIAGNOSIS — F325 Major depressive disorder, single episode, in full remission: Secondary | ICD-10-CM

## 2020-07-30 ENCOUNTER — Ambulatory Visit: Payer: Medicare Other | Admitting: Podiatry

## 2020-07-30 ENCOUNTER — Other Ambulatory Visit: Payer: Self-pay

## 2020-07-30 DIAGNOSIS — M79675 Pain in left toe(s): Secondary | ICD-10-CM

## 2020-07-30 DIAGNOSIS — M79674 Pain in right toe(s): Secondary | ICD-10-CM | POA: Diagnosis not present

## 2020-07-30 DIAGNOSIS — B351 Tinea unguium: Secondary | ICD-10-CM

## 2020-07-31 ENCOUNTER — Other Ambulatory Visit: Payer: Self-pay

## 2020-07-31 DIAGNOSIS — R8271 Bacteriuria: Secondary | ICD-10-CM | POA: Diagnosis not present

## 2020-07-31 DIAGNOSIS — N3281 Overactive bladder: Secondary | ICD-10-CM | POA: Diagnosis not present

## 2020-07-31 DIAGNOSIS — R053 Chronic cough: Secondary | ICD-10-CM

## 2020-07-31 MED ORDER — PANTOPRAZOLE SODIUM 40 MG PO TBEC: DELAYED_RELEASE_TABLET | ORAL | 2 refills | Status: DC

## 2020-08-03 NOTE — Progress Notes (Signed)
Subjective: 86 y.o. returns the office today for painful, elongated, thickened toenails which he cannot trim himself. Denies any redness or drainage around the nails. Denies any acute changes since last appointment and no new complaints today. Denies any systemic complaints such as fevers, chills, nausea, vomiting.   PCP: [name], MD  Objective: NAD- presents with caregiver  DP/PT pulses palpable, CRT less than 3 seconds Nails hypertrophic, dystrophic, elongated, brittle, discolored 10. There is tenderness overlying the nails 1-5 bilaterally. There is no surrounding erythema or drainage along the nail sites. No open lesions or pre-ulcerative lesions are identified. No other areas of tenderness bilateral lower extremities. No overlying edema, erythema, increased warmth. No pain with calf compression, swelling, warmth, erythema.  Assessment: Patient presents with symptomatic onychomycosis  Plan: -Treatment options including alternatives, risks, complications were discussed -Nails sharply debrided 10 without complication/bleeding. -Discussed daily foot inspection. If there are any changes, to call the office immediately.  -Follow-up in 3 months or sooner if any problems are to arise. In the meantime, encouraged to call the office with any questions, concerns, changes symptoms.  Celesta Gentile, DPM

## 2020-08-09 ENCOUNTER — Telehealth: Payer: Self-pay

## 2020-08-09 DIAGNOSIS — R531 Weakness: Secondary | ICD-10-CM

## 2020-08-09 DIAGNOSIS — W19XXXA Unspecified fall, initial encounter: Secondary | ICD-10-CM

## 2020-08-09 NOTE — Addendum Note (Signed)
Addended by: [name] on: 08/09/2020 08:43 PM   Modules accepted: Orders

## 2020-08-09 NOTE — Telephone Encounter (Signed)
Referral was placed. Patient will need to keep visit on the 21st so we can sign off on home health orders-needs "face-to-face" visit for these to be covered

## 2020-08-09 NOTE — Telephone Encounter (Signed)
See below and I will put the referral in.

## 2020-08-09 NOTE — Telephone Encounter (Signed)
Pt.'s daughter says that pt fell today and ems had just come. EMS told daughter that pt would benefit from Occupation Therapy and Physical Therapy. Daughter stated they would like to use Dominica Severin from [facility] again.

## 2020-08-10 NOTE — Telephone Encounter (Signed)
Unable to get in contact with the patient's daughter. LM for her to return my call here at the office. I have sent a mychart message as well.

## 2020-08-13 ENCOUNTER — Telehealth: Payer: Self-pay

## 2020-08-13 ENCOUNTER — Ambulatory Visit: Payer: Medicare Other | Admitting: Internal Medicine

## 2020-08-13 NOTE — Telephone Encounter (Signed)
They both just got out of hospital they have a virtual scheduled tomorrow

## 2020-08-13 NOTE — Progress Notes (Signed)
Phone [phone] Virtual visit via Video note   Subjective:  Chief complaint: Chief Complaint  Patient presents with  . Depression  . Discuss Zoloft  . Fall   This visit type was conducted due to national recommendations for restrictions regarding the COVID-19 Pandemic (e.g. social distancing).  This format is felt to be most appropriate for this patient at this time balancing risks to patient and risks to population by having him in for in person visit.  No physical exam was performed (except for noted visual exam or audio findings with Telehealth visits).    Our team/I connected with Roger [name] at  2:20 PM EST by a video enabled telemedicine application (doxy.me or caregility through epic) and verified that I am speaking with the correct person using two identifiers. Patient was able to visualize me but I could not see them due to technical difficulties.  Location patient: [facility] Location provider: [facility], office Persons participating in the virtual visit:  Patient, daughter  Our team/I discussed the limitations of evaluation and management by telemedicine and the availability of in person appointments. In light of current covid-19 pandemic, patient also understands that we are trying to protect them by minimizing in office contact if at all possible.  The patient expressed consent for telemedicine visit and agreed to proceed. Patient understands insurance will be billed.   Past Medical History-  Patient Active Problem List   Diagnosis Date Noted  . Dysphagia 06/06/2020    Priority: High  . Ascending aortic aneurysm ([place]) 10/17/2017    Priority: High  . Diastolic dysfunction 16/08/930    Priority: High  . Overactive bladder 07/13/2014    Priority: High  . Allergic conjunctivitis of both eyes 06/22/2017    Priority: Medium  . Depression, major, single episode, complete remission ([place]) 07/05/2015    Priority: Medium  . History of pneumonia 03/10/2015    Priority:  Medium  . Chronic cough 10/11/2014    Priority: Medium  . CKD (chronic kidney disease), stage III ([place]) 07/13/2014    Priority: Medium  . Syncope 11/17/2013    Priority: Medium  . Hypertension 06/17/2007    Priority: Medium  . Hyperlipidemia 02/22/2007    Priority: Medium  . Aortic atherosclerosis ([place]) 10/17/2017    Priority: Low  . Bronchitis 08/29/2017    Priority: Low  . Venous (peripheral) insufficiency 01/26/2015    Priority: Low  . Macular degeneration 06/19/2011    Priority: Low  . Osteoarthritis 06/05/2009    Priority: Low  . CARCINOMA, SKIN, SQUAMOUS CELL 05/26/2008    Priority: Low  . GERD (gastroesophageal reflux disease) 02/22/2007    Priority: Low  . Other emphysema ([place]) 05/14/2020  . Right hip pain 09/13/2018  . Laceration of scalp 10/17/2017  . Onychomycosis 10/22/2016    Medications- reviewed and updated Current Outpatient Medications  Medication Sig Dispense Refill  . furosemide (LASIX) 20 MG tablet TAKE 1 TABLET BY MOUTH  DAILY AS NEEDED FOR WEIGHT  GAIN OR INCREASED LEG  SWELLING 90 tablet 3  . INCRUSE ELLIPTA 62.5 MCG/INH AEPB TAKE 1 PUFF BY MOUTH EVERY DAY 30 each 5  . lovastatin (MEVACOR) 40 MG tablet TAKE 1 TABLET BY MOUTH  DAILY 90 tablet 3  . Multiple Vitamins-Minerals (MULTIVITAMIN WITH MINERALS) tablet Take 1 tablet by mouth daily.    . pantoprazole (PROTONIX) 40 MG tablet Take 30- 60 min before your first and last meals of the day 60 tablet 2  . UNABLE TO FIND Med Name: Med  pass 120 mL by mouth twice daily for supplement    . desvenlafaxine 25 MG TB24 Take 25-50 mg by mouth daily. 60 tablet 2  . mupirocin ointment (BACTROBAN) 2 % Apply 1 application topically 2 (two) times daily. (Patient not taking: Reported on 08/14/2020) 30 g 2  . predniSONE (DELTASONE) 20 MG tablet Take 2 tablets (40 mg total) by mouth daily. (Patient not taking: Reported on 08/14/2020) 10 tablet 0   No current facility-administered medications for this visit.      Objective:  There were no vitals taken for this visit. self reported vitals Gen: NAD, resting comfortably Lungs: nonlabored, normal respiratory rate  Skin: appears dry, no obvious rash     Assessment and Plan   # Depression S: Medication:Patient's daughter mentioned that at times the patient is doing well, then there are other times where he is starting to have outburst. She would like to discuss a possible dose increase.   Has bad days where crying, yelling, can't cope well- may have a good # of days and then symptoms worsen again. Very cyclical. Threatens suicide when irrational but no plan in place. Depression screen [facility] 2/9 08/14/2020 06/06/2020 01/02/2020  Decreased Interest 2 1 3   Down, Depressed, Hopeless 3 1 3   PHQ - 2 Score 5 2 6   Altered sleeping 3 0 0  Tired, decreased energy 3 0 1  Change in appetite 1 0 0  Feeling bad or failure about yourself  1 0 0  Trouble concentrating 0 0 0  Moving slowly or fidgety/restless 0 0 0  Suicidal thoughts 1 0 1  PHQ-9 Score 14 2 8   Difficult doing work/chores Very difficult - Somewhat difficult  Some recent data might be hidden   A/P: Depression is very poorly controlled.  I think recent stress of wife being hospitalized is playing a big role but even prior to that daughter reports major issues.  I do not feel strongly about increasing Zoloft dose due to QT interval above 450 when last checked in 2019.  We have tried alternate SSRI Lexapro in the past without improvement.  We opted to try an SNRI Pristiq instead today.  Titration per AVS-Zoloft 25 mg and Pristiq 25 mg for 1 week and then transition to 50 mg of Pristiq alone.  Would recommend follow-up at February visit already scheduled  # Falls falls/generalized weakness/dysphagia S: Patient has had several falls including 2 over the last month requiring EMS visit.  Patient does have a wheelchair but when he tries to transition to the toilet or other seat falls tend to occur.  Falls seem  to occur once he went without [facility] for a month-prior to that was doing very well with Dominica Severin who I believe is a physical therapist.  Patient continues to have issues swallowing with known dysphagia.  This also causes a cough due to aspiration.  From swallow study 05/29/2020 "Patient presents with severe pharyngeal-cervical esophageal dysphagia that is much worse than January 2019.  Minimal pharyngeal muscular contraction, laryngeal elevation, tongue base retraction combined with "wide pharynx" results in gross pharyngeal retention and laryngeal penetration/aspiration.  Various compensation strategies attempted including chin tuck - which again worsened airway protection as it dumps pyriform sinus retention into open larynx.    Cued dry swallows decreased retention marginally but did not clear esophagus and patient does NOT sense retention or mild aspiration.  Large amount of aspiration or accumulation of aspirates in trachea eventually resulted in reflexive cough and expectoration x2  during the study with partial clearing of aspirates and pharyngeal retention.    Unfortunately, Mr Lanz's swallow is much more impaired than during his prior exam 08/2017 and he is now aspirating secretions and liquids (thin and nectar).  "  A/P: I am very concerned about patient's trajectory.  We opted to address this on multiple fronts-updated home health referral from the 16th to include social work as well as home health aide-patient's wife is out of the home ill herself and he really depends on her-he may need additional support during this time-we did discuss this is not always covered by insurance and may need to be private pay -This visit will serve for face-to-face for above request as well as previously requested PT/OT -I message the referral coordinator Lattie Haw to update this and [facility] this [facility] instead of [facility]  At the same time we placed a palliative care referral, really need to  determine long-term direction for patient-he and his wife seem pretty set on wanting to stay in the home together but I am not sure this will be possible long-term.  I wonder if he may even qualify for hospice with ongoing dysphagia issues but I am not sure if he would get additional support in reverse home health aide with hospice  Recommended follow up: Keep February visit or sooner if needed Future Appointments  Date Time Provider [place]  08/27/2020  2:45 PM Tanda Rockers, MD [facility] None  09/27/2020  1:00 PM Rankin, Clent Demark, MD [facility] None  10/16/2020  1:40 PM [name], MD [facility]  10/30/2020  2:15 PM Jacqualyn Posey Bonna Gains, DPM [facility]    Lab/Order associations:   ICD-10-CM   1. Depression, major, single episode, complete remission ([place])  F32.5   2. Dysphagia, unspecified type  R13.10 Amb Referral to Palliative Care  3. Ascending aortic aneurysm (HCC)  I71.2 Amb Referral to Palliative Care  4. Poor mobility  Z74.09 Amb Referral to Palliative Care    Meds ordered this encounter  Medications  . desvenlafaxine 25 MG TB24    Sig: Take 25-50 mg by mouth daily.    Dispense:  60 tablet    Refill:  2   Time Spent: 30 minutes of total time (2:33 PM- 3:03 PM) was spent on the date of the encounter performing the following actions: chart review prior to seeing the patient, obtaining history, performing a medically necessary exam, counseling on the treatment plan, placing orders, and documenting in our EHR.   Return precautions advised.  Garret Reddish, MD

## 2020-08-13 NOTE — Telephone Encounter (Signed)
Noted! Thank you

## 2020-08-13 NOTE — Telephone Encounter (Signed)
Patients daughter is requesting to look into the next form of care her parents will need patient andWendee Copp, [facility] ) patient fell last week as well wife over the weekend, daughter would like to discuss next type of care they need.

## 2020-08-13 NOTE — Patient Instructions (Addendum)
For first week take zoloft 50mg  (half of 100mg  tablet) and 25 mg of desvenlafaxine.   After 1 week, take 2 desvenlafaxine and no zoloft

## 2020-08-14 ENCOUNTER — Telehealth (INDEPENDENT_AMBULATORY_CARE_PROVIDER_SITE_OTHER): Payer: Medicare Other | Admitting: Family Medicine

## 2020-08-14 ENCOUNTER — Encounter: Payer: Self-pay | Admitting: Family Medicine

## 2020-08-14 ENCOUNTER — Telehealth: Payer: Self-pay

## 2020-08-14 DIAGNOSIS — F325 Major depressive disorder, single episode, in full remission: Secondary | ICD-10-CM

## 2020-08-14 DIAGNOSIS — I7121 Aneurysm of the ascending aorta, without rupture: Secondary | ICD-10-CM

## 2020-08-14 DIAGNOSIS — R1310 Dysphagia, unspecified: Secondary | ICD-10-CM

## 2020-08-14 DIAGNOSIS — Z7409 Other reduced mobility: Secondary | ICD-10-CM | POA: Diagnosis not present

## 2020-08-14 DIAGNOSIS — I712 Thoracic aortic aneurysm, without rupture: Secondary | ICD-10-CM

## 2020-08-14 MED ORDER — DESVENLAFAXINE SUCCINATE ER 25 MG PO TB24: 25.0000 mg | ORAL_TABLET | Freq: Every day | ORAL | 2 refills | Status: DC

## 2020-08-14 NOTE — Addendum Note (Signed)
Addended by: [name] on: 08/14/2020 02:57 PM   Modules accepted: Orders

## 2020-08-14 NOTE — Telephone Encounter (Signed)
Spoke with patients caregiver Coralyn Mark and scheduled an in-person Palliative Consult for 08/17/20 @ 10AM   COVID screening was negative. No pets in home. Patient lives with spouse, but she is hospitalized at this time.    Consent obtained; updated Outlook/Netsmart/Team List and Epic.

## 2020-08-17 ENCOUNTER — Other Ambulatory Visit: Payer: Medicare Other | Admitting: Nurse Practitioner

## 2020-08-17 ENCOUNTER — Other Ambulatory Visit: Payer: Self-pay

## 2020-08-17 DIAGNOSIS — R059 Cough, unspecified: Secondary | ICD-10-CM | POA: Diagnosis not present

## 2020-08-17 DIAGNOSIS — Z515 Encounter for palliative care: Secondary | ICD-10-CM | POA: Diagnosis not present

## 2020-08-17 NOTE — Progress Notes (Signed)
[facility] Consult Note Telephone: [phone]  Fax: [phone]  PATIENT NAME: Roger [name] [address]  [place] [address] [phone] (home)  DOB: July 21, 1920 MRN: [id number]  PRIMARY CARE PROVIDER:    [name], MD,  [place] [address] [phone]  REFERRING PROVIDER:   Marin [name], [place],  [place] [address] [phone]  RESPONSIBLE PARTY:   Extended Emergency Contact Information Primary Emergency Contact: Roger,[name] Address: [address]          [place], [place] [address] Roger [name] of [place] Phone: [phone] Relation: Spouse Secondary Emergency Contact: Roger,[name] Address: [address]          [place], [place] [address] Roger [name] of [place] Phone: [phone] Mobile Phone: [phone] Relation: Daughter  I met face to face with patient and family in home.  ASSESSMENT AND RECOMMENDATIONS:   Advance Care Planning: Visit consisted of building trust and discussions on Palliative care medicine as specialized medical care for people living with serious illness, aimed at facilitating better quality of life through symptoms relief, assisting with advance care planning and establishing goals of care. Advance care planning was deferred per daughter [name]'s request, stating patient has a lot going on with patient being upset about his wife hospitalization. Patient has been married to his wife for 75 years, she is currently hospitalized. Roger [name] said she would want ACP discussed at another time, as discussion about end of life would further upset patient.  Symptom Management:  Pain: Patient report a 8/10, report pain in bilateral knee joints, had steroid injections without relief in the past. Patient is being evaluated for resumption of physical therapy. Patient report intolerance to  Tylenol, report Tylenol gives him stomach upset.  Report using [facility] with some relief. Recommendation: Continue Icy Hot application to bilateral knees as need for pain. Consider starting patient on Meloxicam, start at 7.41m by mouth daily. Protein Calorie Malnutrition: As evidenced by >6% weight loss and generalized muscle wasting/arthrophy. Family report poor oral intake, report patient unable to finish a small bottle of water (2367m in a day, report patient loves drinking wine. His son in-law expressed concern about how much wine patient consumes. Report patient drinks about a glass of wine with every meal. Report patient eats less than 50% of his meal. Recommendation: Encourage limiting alcohol consumption. Recommend starting patient on nutritional supplement like Ensure or protein drinks. Encourage patient to drink at least one can a day, goal will be to drink at least 2 cans a day between meals to augment her caloric intake. Encourage adequate meal choices from a variety of food groups, encourage snacking between meals. Encourage patient to drink at least at least one small bottle of water (23775mwith each meal, aim for 4 bottle a day, to promote adequate hydration, preventing dry mouth and secretions. Congested cough: Patient on over the counter liquid Mucinex. Patient report only taking 62m28mce a day. Recommendation: Advised patient to increase dosing to 62ml79mee times day.   Follow up Palliative Care Visit: Palliative care will continue to follow for goals of care clarification and symptom management. Return in about 4 weeks or prn.  Family /Caregiver/Community Supports: Patient is a [place] [employer]. He and his wife currently lives with his daughter [name] Roger Lippsher family. He has a personal care giver Roger [name] who assist in his ADLs.  Cognitive / Functional decline: Patient awake, alert, and  coherent. He is total care for his ADls, able to feed self and wheel himself around in his wheel chair, incontinent of bowel and bladder.  I spent  60 minutes providing this consultation, time includes time spent with patient and family, chart review, provider coordination, and documentation. More than 50% of the time in this consultation was spent counseling and coordinating communication.   CHIEF COMPLAINT:  Cough  History obtained from review of EMR, discussion with patient and interview with family.Records reviewed and summarized bellow.  HISTORY OF PRESENT ILLNESS:  Roger [name] is a 86 y.o. year old male with multiple medical problems including Emphysema and pulmonary fibrosis, dysphagia (pharyngeal dysphagia), GERD, HLD, overactive bladder,depression. Patient with complaint of cough, that is ongoing but worsened the last month, cough is productive of clear secretion, no report of fever, chills, or SOB. Patient report using Mucinex without relief. Palliative Care was asked to follow this patient by consultation request of Roger Olp, MD to help address advance care planning and goals of care. This is an initial visit.  CODE STATUS: Full Code  PPS: 40%  HOSPICE ELIGIBILITY/DIAGNOSIS: TBD  PHYSICAL EXAM / ROS:   BP 98/64, P 76, RR 18, 97% on room air Current and past weights: 102lbs down from baseline weight of 180s per patient's report, Ht 34f7", BMI 16kg/m2 General: chronically ill and frail appearing, thin, sitting in chair in his dinning area actively participating in discussion. Cardiovascular: denied chest pain, +2 edema to bilateral lower legs Pulmonary: chronic cough, no increased SOB, room air GI: appetite poor, denied constipation, incontinent of bowel GU: denies dysuria, incontinent of urine MSK:  no joint and ROM abnormalities, ambulatory Skin: no rashes or wounds reported, skin thin and fragile Neurological: Weakness, but otherwise nonfocal Psych: non -anxious affect  PAST MEDICAL HISTORY:  Past Medical History:  Diagnosis Date  . GERD 02/22/2007  . HYPERLIPIDEMIA 02/22/2007  . HYPERTENSION 06/17/2007   . MACULAR DEGENERATION 02/22/2007  . OSTEOARTHRITIS, HIP, RIGHT 06/05/2009    SOCIAL HX:  Social History   Tobacco Use  . Smoking status: Former Smoker    Packs/day: 1.00    Years: 20.00    Pack years: 20.00    Types: Cigars, Cigarettes, Pipe    Quit date: 08/25/2014    Years since quitting: 5.9  . Smokeless tobacco: Never Used  Substance Use Topics  . Alcohol use: No   FAMILY HX:  Family History  Problem Relation Age of Onset  . Breast cancer Daughter     ALLERGIES:  Allergies  Allergen Reactions  . Amlodipine Besylate     REACTION: swelling of feet  . Aspirin     REACTION: GI upset can tolerate ecotrin  . Hydrocodone     GI upset     PERTINENT MEDICATIONS:  Outpatient Encounter Medications as of 08/17/2020  Medication Sig  . desvenlafaxine 25 MG TB24 Take 25-50 mg by mouth daily.  . furosemide (LASIX) 20 MG tablet TAKE 1 TABLET BY MOUTH  DAILY AS NEEDED FOR WEIGHT  GAIN OR INCREASED LEG  SWELLING  . INCRUSE ELLIPTA 62.5 MCG/INH AEPB TAKE 1 PUFF BY MOUTH EVERY DAY  . lovastatin (MEVACOR) 40 MG tablet TAKE 1 TABLET BY MOUTH  DAILY  . Multiple Vitamins-Minerals (MULTIVITAMIN WITH MINERALS) tablet Take 1 tablet by mouth daily.  . mupirocin ointment (BACTROBAN) 2 % Apply 1 application topically 2 (two) times daily. (Patient not taking: Reported on 08/14/2020)  . pantoprazole (PROTONIX) 40 MG tablet Take 30- 60 min  before your first and last meals of the day  . predniSONE (DELTASONE) 20 MG tablet Take 2 tablets (40 mg total) by mouth daily. (Patient not taking: Reported on 08/14/2020)  . UNABLE TO FIND Med Name: Med pass 120 mL by mouth twice daily for supplement  . [DISCONTINUED] diltiazem (CARDIZEM CD) 180 MG 24 hr capsule Take 180 mg by mouth daily.   . [DISCONTINUED] hydrochlorothiazide (MICROZIDE) 12.5 MG capsule Take 1 capsule (12.5 mg total) by mouth daily.   No facility-administered encounter medications on file as of 08/17/2020.    Thank you for the opportunity  to participate in the care of Mr. [employer]. The palliative care team will continue to follow. Please call our office at [phone] if we can be of additional assistance.   Jari Favre, DNP, AGPCNP-BC

## 2020-08-21 ENCOUNTER — Telehealth: Payer: Self-pay

## 2020-08-21 NOTE — Telephone Encounter (Signed)
Pt.'s daughter is following up on a pt, ot, and home health for her dad. [facility] has not received any prescriptions.

## 2020-08-21 NOTE — Telephone Encounter (Signed)
Shoulder the daughter follow up with [facility] or [facility]? [facility] did a visit with the family on 08/17/2020. Please advise

## 2020-08-22 NOTE — Telephone Encounter (Signed)
Patient is following up regarding paper work being faxed to [facility] [phone]

## 2020-08-22 NOTE — Telephone Encounter (Signed)
Family requested [facility]. Originally had been submitted to [facility] though notes mentioned [facility] specifically. Roger [name] was going to reach out to help fix situation. I would check with her on this- not sure why [facility] did not receive. If lisa needs- can be resubmitted under my name

## 2020-08-23 ENCOUNTER — Telehealth: Payer: Self-pay

## 2020-08-23 DIAGNOSIS — R531 Weakness: Secondary | ICD-10-CM

## 2020-08-23 DIAGNOSIS — W19XXXA Unspecified fall, initial encounter: Secondary | ICD-10-CM

## 2020-08-23 DIAGNOSIS — R1310 Dysphagia, unspecified: Secondary | ICD-10-CM

## 2020-08-23 NOTE — Telephone Encounter (Signed)
[place] can you follow up on this please or locate the paperwork and fax it?

## 2020-08-23 NOTE — Telephone Encounter (Signed)
I am also not in the office today I am unable to fax any paperwork

## 2020-08-23 NOTE — Telephone Encounter (Signed)
Spoke with Duwayne Heck from [employer] and she stated that we would need to send a whole new referral with office notes dating 3 months back and a patient demographics sheet.

## 2020-08-23 NOTE — Telephone Encounter (Signed)
[facility] states they have not received an order for Physical therapy for pt.   Fax [phone]

## 2020-08-23 NOTE — Telephone Encounter (Signed)
See [facility] message below, any update?

## 2020-08-25 NOTE — Telephone Encounter (Signed)
Referral was sent 08/09/20. Team/[name]- can you put your heads together on this? If needs to be reentered can be reentered using same orders/info

## 2020-08-27 ENCOUNTER — Ambulatory Visit: Payer: Medicare Other | Admitting: Internal Medicine

## 2020-08-27 NOTE — Telephone Encounter (Signed)
I have spoken with [facility] team and I am faxing the referral again to them using the fax number I was given

## 2020-08-28 ENCOUNTER — Other Ambulatory Visit: Payer: Self-pay | Admitting: Family Medicine

## 2020-08-28 NOTE — Telephone Encounter (Signed)
[facility] called and said Dr. Durene Cal needed to take the Nursing Aid off the order for the patient or it will not be approved by the insurance.

## 2020-08-28 NOTE — Telephone Encounter (Signed)
See below

## 2020-08-28 NOTE — Addendum Note (Signed)
Addended by: Shelva Majestic on: 08/28/2020 05:21 PM   Modules accepted: Orders

## 2020-08-29 NOTE — Addendum Note (Signed)
Addended by: Shelva Majestic on: 08/29/2020 01:13 PM   Modules accepted: Orders

## 2020-08-30 ENCOUNTER — Other Ambulatory Visit: Payer: Self-pay | Admitting: Internal Medicine

## 2020-08-30 DIAGNOSIS — R053 Chronic cough: Secondary | ICD-10-CM

## 2020-08-30 MED ORDER — PANTOPRAZOLE SODIUM 40 MG PO TBEC: DELAYED_RELEASE_TABLET | ORAL | 0 refills | Status: DC

## 2020-08-31 DIAGNOSIS — J438 Other emphysema: Secondary | ICD-10-CM | POA: Diagnosis not present

## 2020-08-31 DIAGNOSIS — I712 Thoracic aortic aneurysm, without rupture: Secondary | ICD-10-CM | POA: Diagnosis not present

## 2020-08-31 DIAGNOSIS — N183 Chronic kidney disease, stage 3 unspecified: Secondary | ICD-10-CM | POA: Diagnosis not present

## 2020-08-31 DIAGNOSIS — I872 Venous insufficiency (chronic) (peripheral): Secondary | ICD-10-CM | POA: Diagnosis not present

## 2020-08-31 DIAGNOSIS — W19XXXD Unspecified fall, subsequent encounter: Secondary | ICD-10-CM | POA: Diagnosis not present

## 2020-08-31 DIAGNOSIS — K219 Gastro-esophageal reflux disease without esophagitis: Secondary | ICD-10-CM | POA: Diagnosis not present

## 2020-08-31 DIAGNOSIS — M6281 Muscle weakness (generalized): Secondary | ICD-10-CM | POA: Diagnosis not present

## 2020-08-31 DIAGNOSIS — I129 Hypertensive chronic kidney disease with stage 1 through stage 4 chronic kidney disease, or unspecified chronic kidney disease: Secondary | ICD-10-CM | POA: Diagnosis not present

## 2020-08-31 DIAGNOSIS — R1310 Dysphagia, unspecified: Secondary | ICD-10-CM | POA: Diagnosis not present

## 2020-09-03 ENCOUNTER — Encounter: Payer: Self-pay | Admitting: Physician Assistant

## 2020-09-03 ENCOUNTER — Other Ambulatory Visit: Payer: Self-pay

## 2020-09-03 ENCOUNTER — Ambulatory Visit (INDEPENDENT_AMBULATORY_CARE_PROVIDER_SITE_OTHER): Payer: Medicare Other | Admitting: Physician Assistant

## 2020-09-03 ENCOUNTER — Other Ambulatory Visit: Payer: Self-pay | Admitting: *Deleted

## 2020-09-03 VITALS — BP 110/62 | HR 65 | Temp 97.9°F | Ht 64.0 in | Wt 110.0 lb

## 2020-09-03 DIAGNOSIS — I872 Venous insufficiency (chronic) (peripheral): Secondary | ICD-10-CM | POA: Diagnosis not present

## 2020-09-03 DIAGNOSIS — N183 Chronic kidney disease, stage 3 unspecified: Secondary | ICD-10-CM | POA: Diagnosis not present

## 2020-09-03 DIAGNOSIS — K219 Gastro-esophageal reflux disease without esophagitis: Secondary | ICD-10-CM | POA: Diagnosis not present

## 2020-09-03 DIAGNOSIS — R591 Generalized enlarged lymph nodes: Secondary | ICD-10-CM

## 2020-09-03 DIAGNOSIS — J438 Other emphysema: Secondary | ICD-10-CM | POA: Diagnosis not present

## 2020-09-03 DIAGNOSIS — I712 Thoracic aortic aneurysm, without rupture: Secondary | ICD-10-CM | POA: Diagnosis not present

## 2020-09-03 DIAGNOSIS — R1310 Dysphagia, unspecified: Secondary | ICD-10-CM | POA: Diagnosis not present

## 2020-09-03 DIAGNOSIS — M6281 Muscle weakness (generalized): Secondary | ICD-10-CM | POA: Diagnosis not present

## 2020-09-03 DIAGNOSIS — I129 Hypertensive chronic kidney disease with stage 1 through stage 4 chronic kidney disease, or unspecified chronic kidney disease: Secondary | ICD-10-CM | POA: Diagnosis not present

## 2020-09-03 DIAGNOSIS — L819 Disorder of pigmentation, unspecified: Secondary | ICD-10-CM

## 2020-09-03 DIAGNOSIS — W19XXXD Unspecified fall, subsequent encounter: Secondary | ICD-10-CM | POA: Diagnosis not present

## 2020-09-03 NOTE — Patient Instructions (Addendum)
Please follow-up with Dr. Yong Channel in 2-4 weeks to follow-up on your leg and lymphnode.  Roger [name] has a history of plaque in his abdominal aorta. Stopping the statin would possibly cause build-up of this. Dr. Yong Channel would like to discuss family's thoughts on this prior to discontinuation -- this can occur at your follow-up appointment.  Contact a doctor if:  You have a fever.  Under the infected area starts to hurt after the skin has healed.  Your infection comes back. This can happen in the same area or another area.  You have a swollen bump in the area.  You have new symptoms.  You feel ill and have muscle aches and pains. Get help right away if:  Your symptoms get worse.  You feel very sleepy.  You throw up (vomit) or have watery poop (diarrhea) for a long time.  You see red streaks coming from the area.  Your red area gets larger.  Your red area turns dark in color.

## 2020-09-03 NOTE — Progress Notes (Signed)
Roger [name] is a 87 y.o. male here for a new problem.  I acted as a Education administrator for [employer], PA-C Anselmo Pickler, LPN   History of Present Illness:   Chief Complaint  Patient presents with  . Otalgia  . Left lower leg    HPI   Otalgia Pt c/o pain right ear and lymph node is swollen on the right side, started 5 days ago. Denies: fever, chills, poor appetite, nausea/vomiting, new URI symptoms.   Lower leg C/o left lower leg has been red x one week and swollen. Denies pain/warmth/fever. He has been trying to elevate his legs. Has had improvement of his symptoms with elevation.  Has venous insufficiency at baseline. Takes lasix 20 mg prn. Right lower leg had some initial issues with this redness but they have improved.    Past Medical History:  Diagnosis Date  . GERD 02/22/2007  . HYPERLIPIDEMIA 02/22/2007  . HYPERTENSION 06/17/2007  . MACULAR DEGENERATION 02/22/2007  . OSTEOARTHRITIS, HIP, RIGHT 06/05/2009     Social History   Tobacco Use  . Smoking status: Former Smoker    Packs/day: 1.00    Years: 20.00    Pack years: 20.00    Types: Cigars, Cigarettes, Pipe    Quit date: 08/25/2014    Years since quitting: 6.0  . Smokeless tobacco: Never Used  Vaping Use  . Vaping Use: Never used  Substance Use Topics  . Alcohol use: No  . Drug use: No    Past Surgical History:  Procedure Laterality Date  . LAMINECTOMY  1988    Family History  Problem Relation Age of Onset  . Breast cancer Daughter     Allergies  Allergen Reactions  . Amlodipine Besylate     REACTION: swelling of feet  . Aspirin     REACTION: GI upset can tolerate ecotrin  . Hydrocodone     GI upset    Current Medications:   Current Outpatient Medications:  .  Desvenlafaxine Succinate ER 25 MG TB24, TAKE 25-50 MG BY MOUTH DAILY., Disp: 180 tablet, Rfl: 1 .  furosemide (LASIX) 20 MG tablet, TAKE 1 TABLET BY MOUTH  DAILY AS NEEDED FOR WEIGHT  GAIN OR INCREASED LEG  SWELLING, Disp: 90 tablet,  Rfl: 3 .  INCRUSE ELLIPTA 62.5 MCG/INH AEPB, TAKE 1 PUFF BY MOUTH EVERY DAY, Disp: 30 each, Rfl: 5 .  lovastatin (MEVACOR) 40 MG tablet, TAKE 1 TABLET BY MOUTH  DAILY, Disp: 90 tablet, Rfl: 3 .  Multiple Vitamins-Minerals (MULTIVITAMIN WITH MINERALS) tablet, Take 1 tablet by mouth daily., Disp: , Rfl:  .  mupirocin ointment (BACTROBAN) 2 %, Apply 1 application topically 2 (two) times daily., Disp: 30 g, Rfl: 2 .  pantoprazole (PROTONIX) 40 MG tablet, Take 30- 60 min before your first and last meals of the day, Disp: 180 tablet, Rfl: 0 .  sertraline (ZOLOFT) 100 MG tablet, Take 100 mg by mouth daily., Disp: , Rfl:  .  UNABLE TO FIND, Med Name: Med pass 120 mL by mouth twice daily for supplement, Disp: , Rfl:  .  MYRBETRIQ 50 MG TB24 tablet, Take 50 mg by mouth daily., Disp: , Rfl:    Review of Systems:   ROS  Negative unless otherwise specified per HPI.  Vitals:   Vitals:   09/03/20 1407  BP: 110/62  Pulse: 65  Temp: 97.9 F (36.6 C)  TempSrc: Temporal  SpO2: 99%  Weight: 110 lb (49.9 kg)  Height: 5\' 4"  (1.626 m)  Body mass index is 18.88 kg/m.  Physical Exam:   Physical Exam Vitals and nursing note reviewed.  Constitutional:      General: He is not in acute distress.    Appearance: He is well-developed and well-nourished. He is not ill-appearing, toxic-appearing or sickly-appearing.  HENT:     Head: Normocephalic and atraumatic.     Right Ear: Tympanic membrane, ear canal and external ear normal. Tympanic membrane is not erythematous, retracted or bulging.     Left Ear: Tympanic membrane, ear canal and external ear normal. Tympanic membrane is not erythematous, retracted or bulging.     Nose: Nose normal.     Right Sinus: No maxillary sinus tenderness or frontal sinus tenderness.     Left Sinus: No maxillary sinus tenderness or frontal sinus tenderness.     Mouth/Throat:     Pharynx: Uvula midline. No posterior oropharyngeal edema or posterior oropharyngeal erythema.   Eyes:     General: Lids are normal.     Conjunctiva/sclera: Conjunctivae normal.  Neck:     Trachea: Trachea normal.  Cardiovascular:     Rate and Rhythm: Normal rate and regular rhythm.     Heart sounds: Normal heart sounds, S1 normal and S2 normal.  Pulmonary:     Effort: Pulmonary effort is normal. No accessory muscle usage or respiratory distress.     Breath sounds: Normal breath sounds. No decreased breath sounds, wheezing, rhonchi or rales.  Lymphadenopathy:     Cervical: Cervical adenopathy present.     Right cervical: Superficial cervical adenopathy present.  Skin:    General: Skin is warm, dry and intact.     Comments: Bilateral LE with pitting edema LLE with well-demarcated erythema up to mid shin. No tenderness or warmth.  Neurological:     Mental Status: He is alert.  Psychiatric:        Mood and Affect: Mood and affect normal.        Speech: Speech normal.        Behavior: Behavior normal. Behavior is cooperative.     Assessment and Plan:   Roger [name] was seen today for otalgia and left lower leg.  Diagnoses and all orders for this visit:  Lymphadenopathy No red flags on exam. No evidence of infection on my exam. Worsening precautions advised. Discussed watchful waiting and follow-up with myself or PCP within 4 weeks for further evaluation.  Discolored skin Patient also evaluated by Dr. Yong Channel in the office. No evidence of infection and appears to be improved with elevation of legs. Continue to monitor and take pictures. Worsening precautions advised. Follow-up with Dr. Yong Channel in 2-4 weeks.  CMA or LPN served as scribe during this visit. History, Physical, and Plan performed by medical provider. The above documentation has been reviewed and is accurate and complete.  [name], PA-C

## 2020-09-04 ENCOUNTER — Telehealth: Payer: Self-pay

## 2020-09-04 ENCOUNTER — Ambulatory Visit: Payer: Medicare Other | Admitting: Physician Assistant

## 2020-09-04 DIAGNOSIS — K219 Gastro-esophageal reflux disease without esophagitis: Secondary | ICD-10-CM | POA: Diagnosis not present

## 2020-09-04 DIAGNOSIS — R1310 Dysphagia, unspecified: Secondary | ICD-10-CM | POA: Diagnosis not present

## 2020-09-04 DIAGNOSIS — N183 Chronic kidney disease, stage 3 unspecified: Secondary | ICD-10-CM | POA: Diagnosis not present

## 2020-09-04 DIAGNOSIS — I129 Hypertensive chronic kidney disease with stage 1 through stage 4 chronic kidney disease, or unspecified chronic kidney disease: Secondary | ICD-10-CM | POA: Diagnosis not present

## 2020-09-04 DIAGNOSIS — W19XXXD Unspecified fall, subsequent encounter: Secondary | ICD-10-CM | POA: Diagnosis not present

## 2020-09-04 DIAGNOSIS — M6281 Muscle weakness (generalized): Secondary | ICD-10-CM | POA: Diagnosis not present

## 2020-09-04 DIAGNOSIS — J438 Other emphysema: Secondary | ICD-10-CM | POA: Diagnosis not present

## 2020-09-04 DIAGNOSIS — I872 Venous insufficiency (chronic) (peripheral): Secondary | ICD-10-CM | POA: Diagnosis not present

## 2020-09-04 DIAGNOSIS — I712 Thoracic aortic aneurysm, without rupture: Secondary | ICD-10-CM | POA: Diagnosis not present

## 2020-09-04 NOTE — Telephone Encounter (Signed)
Roger [name] is calling in from [facility] to report a fall. Patient fell, is current complaining of right knee pain, rated pain 5/10.

## 2020-09-04 NOTE — Telephone Encounter (Signed)
OV needed?

## 2020-09-04 NOTE — Telephone Encounter (Signed)
Betsy from [facility] called requesting verbal orders for a speech therapy evaluation. Pt has been choking on his food lately. Please advise.

## 2020-09-04 NOTE — Telephone Encounter (Signed)
Called and lm on Betsy confidential vm with VO. °

## 2020-09-04 NOTE — Telephone Encounter (Signed)
Lets check on him tomorrow-could potentially work him in at 10 but we need to get x-ray of his knee ordered ahead of time and done at the beginning of the visit to make sure there is time for x-ray to get to lunch

## 2020-09-05 DIAGNOSIS — I712 Thoracic aortic aneurysm, without rupture: Secondary | ICD-10-CM | POA: Diagnosis not present

## 2020-09-05 DIAGNOSIS — N183 Chronic kidney disease, stage 3 unspecified: Secondary | ICD-10-CM | POA: Diagnosis not present

## 2020-09-05 DIAGNOSIS — R1310 Dysphagia, unspecified: Secondary | ICD-10-CM | POA: Diagnosis not present

## 2020-09-05 DIAGNOSIS — K219 Gastro-esophageal reflux disease without esophagitis: Secondary | ICD-10-CM | POA: Diagnosis not present

## 2020-09-05 DIAGNOSIS — I129 Hypertensive chronic kidney disease with stage 1 through stage 4 chronic kidney disease, or unspecified chronic kidney disease: Secondary | ICD-10-CM | POA: Diagnosis not present

## 2020-09-05 DIAGNOSIS — I872 Venous insufficiency (chronic) (peripheral): Secondary | ICD-10-CM | POA: Diagnosis not present

## 2020-09-05 DIAGNOSIS — J438 Other emphysema: Secondary | ICD-10-CM | POA: Diagnosis not present

## 2020-09-05 DIAGNOSIS — M6281 Muscle weakness (generalized): Secondary | ICD-10-CM | POA: Diagnosis not present

## 2020-09-05 DIAGNOSIS — W19XXXD Unspecified fall, subsequent encounter: Secondary | ICD-10-CM | POA: Diagnosis not present

## 2020-09-05 NOTE — Telephone Encounter (Signed)
FYI, Called to follow up and check on pt and the care giver answered. She states pt had a bad accident (bowel movement) in the bathroom earlier this morning and states he does not feel like going anywhere. I told her that we would check back in tomorrow to see how he is feeling.

## 2020-09-06 DIAGNOSIS — W19XXXD Unspecified fall, subsequent encounter: Secondary | ICD-10-CM | POA: Diagnosis not present

## 2020-09-06 DIAGNOSIS — N183 Chronic kidney disease, stage 3 unspecified: Secondary | ICD-10-CM | POA: Diagnosis not present

## 2020-09-06 DIAGNOSIS — M6281 Muscle weakness (generalized): Secondary | ICD-10-CM | POA: Diagnosis not present

## 2020-09-06 DIAGNOSIS — I872 Venous insufficiency (chronic) (peripheral): Secondary | ICD-10-CM | POA: Diagnosis not present

## 2020-09-06 DIAGNOSIS — I129 Hypertensive chronic kidney disease with stage 1 through stage 4 chronic kidney disease, or unspecified chronic kidney disease: Secondary | ICD-10-CM | POA: Diagnosis not present

## 2020-09-06 DIAGNOSIS — R1310 Dysphagia, unspecified: Secondary | ICD-10-CM | POA: Diagnosis not present

## 2020-09-06 DIAGNOSIS — I712 Thoracic aortic aneurysm, without rupture: Secondary | ICD-10-CM | POA: Diagnosis not present

## 2020-09-06 DIAGNOSIS — J438 Other emphysema: Secondary | ICD-10-CM | POA: Diagnosis not present

## 2020-09-06 DIAGNOSIS — K219 Gastro-esophageal reflux disease without esophagitis: Secondary | ICD-10-CM | POA: Diagnosis not present

## 2020-09-11 DIAGNOSIS — I872 Venous insufficiency (chronic) (peripheral): Secondary | ICD-10-CM | POA: Diagnosis not present

## 2020-09-11 DIAGNOSIS — R1310 Dysphagia, unspecified: Secondary | ICD-10-CM | POA: Diagnosis not present

## 2020-09-11 DIAGNOSIS — N183 Chronic kidney disease, stage 3 unspecified: Secondary | ICD-10-CM | POA: Diagnosis not present

## 2020-09-11 DIAGNOSIS — I712 Thoracic aortic aneurysm, without rupture: Secondary | ICD-10-CM | POA: Diagnosis not present

## 2020-09-11 DIAGNOSIS — I129 Hypertensive chronic kidney disease with stage 1 through stage 4 chronic kidney disease, or unspecified chronic kidney disease: Secondary | ICD-10-CM | POA: Diagnosis not present

## 2020-09-11 DIAGNOSIS — J438 Other emphysema: Secondary | ICD-10-CM | POA: Diagnosis not present

## 2020-09-11 DIAGNOSIS — K219 Gastro-esophageal reflux disease without esophagitis: Secondary | ICD-10-CM | POA: Diagnosis not present

## 2020-09-11 DIAGNOSIS — W19XXXD Unspecified fall, subsequent encounter: Secondary | ICD-10-CM | POA: Diagnosis not present

## 2020-09-11 DIAGNOSIS — M6281 Muscle weakness (generalized): Secondary | ICD-10-CM | POA: Diagnosis not present

## 2020-09-12 DIAGNOSIS — K219 Gastro-esophageal reflux disease without esophagitis: Secondary | ICD-10-CM | POA: Diagnosis not present

## 2020-09-12 DIAGNOSIS — W19XXXD Unspecified fall, subsequent encounter: Secondary | ICD-10-CM | POA: Diagnosis not present

## 2020-09-12 DIAGNOSIS — M6281 Muscle weakness (generalized): Secondary | ICD-10-CM | POA: Diagnosis not present

## 2020-09-12 DIAGNOSIS — J438 Other emphysema: Secondary | ICD-10-CM | POA: Diagnosis not present

## 2020-09-12 DIAGNOSIS — I872 Venous insufficiency (chronic) (peripheral): Secondary | ICD-10-CM | POA: Diagnosis not present

## 2020-09-12 DIAGNOSIS — I129 Hypertensive chronic kidney disease with stage 1 through stage 4 chronic kidney disease, or unspecified chronic kidney disease: Secondary | ICD-10-CM | POA: Diagnosis not present

## 2020-09-12 DIAGNOSIS — R1310 Dysphagia, unspecified: Secondary | ICD-10-CM | POA: Diagnosis not present

## 2020-09-12 DIAGNOSIS — I712 Thoracic aortic aneurysm, without rupture: Secondary | ICD-10-CM | POA: Diagnosis not present

## 2020-09-12 DIAGNOSIS — N183 Chronic kidney disease, stage 3 unspecified: Secondary | ICD-10-CM | POA: Diagnosis not present

## 2020-09-12 NOTE — Telephone Encounter (Signed)
I am assuming by the message that the order needs to be changed.  Lattie Haw

## 2020-09-12 NOTE — Telephone Encounter (Signed)
See below

## 2020-09-13 ENCOUNTER — Telehealth: Payer: Self-pay

## 2020-09-13 NOTE — Telephone Encounter (Signed)
[facility] is requesting verbal orders for -   Nurse eval for a wound on the left forearm  [phone] Ok to leave voicemail

## 2020-09-13 NOTE — Telephone Encounter (Signed)
Called and left VO on vm.

## 2020-09-13 NOTE — Telephone Encounter (Signed)
Called [facility] unable to reach anyone, Did [facility].

## 2020-09-14 DIAGNOSIS — M6281 Muscle weakness (generalized): Secondary | ICD-10-CM | POA: Diagnosis not present

## 2020-09-14 DIAGNOSIS — N183 Chronic kidney disease, stage 3 unspecified: Secondary | ICD-10-CM | POA: Diagnosis not present

## 2020-09-14 DIAGNOSIS — I129 Hypertensive chronic kidney disease with stage 1 through stage 4 chronic kidney disease, or unspecified chronic kidney disease: Secondary | ICD-10-CM | POA: Diagnosis not present

## 2020-09-14 DIAGNOSIS — J438 Other emphysema: Secondary | ICD-10-CM | POA: Diagnosis not present

## 2020-09-14 DIAGNOSIS — I872 Venous insufficiency (chronic) (peripheral): Secondary | ICD-10-CM | POA: Diagnosis not present

## 2020-09-14 DIAGNOSIS — R1310 Dysphagia, unspecified: Secondary | ICD-10-CM | POA: Diagnosis not present

## 2020-09-14 DIAGNOSIS — K219 Gastro-esophageal reflux disease without esophagitis: Secondary | ICD-10-CM | POA: Diagnosis not present

## 2020-09-14 DIAGNOSIS — I712 Thoracic aortic aneurysm, without rupture: Secondary | ICD-10-CM | POA: Diagnosis not present

## 2020-09-14 DIAGNOSIS — W19XXXD Unspecified fall, subsequent encounter: Secondary | ICD-10-CM | POA: Diagnosis not present

## 2020-09-15 DIAGNOSIS — N183 Chronic kidney disease, stage 3 unspecified: Secondary | ICD-10-CM | POA: Diagnosis not present

## 2020-09-15 DIAGNOSIS — K219 Gastro-esophageal reflux disease without esophagitis: Secondary | ICD-10-CM | POA: Diagnosis not present

## 2020-09-15 DIAGNOSIS — R1310 Dysphagia, unspecified: Secondary | ICD-10-CM | POA: Diagnosis not present

## 2020-09-15 DIAGNOSIS — I712 Thoracic aortic aneurysm, without rupture: Secondary | ICD-10-CM | POA: Diagnosis not present

## 2020-09-15 DIAGNOSIS — W19XXXD Unspecified fall, subsequent encounter: Secondary | ICD-10-CM | POA: Diagnosis not present

## 2020-09-15 DIAGNOSIS — M6281 Muscle weakness (generalized): Secondary | ICD-10-CM | POA: Diagnosis not present

## 2020-09-15 DIAGNOSIS — J438 Other emphysema: Secondary | ICD-10-CM | POA: Diagnosis not present

## 2020-09-15 DIAGNOSIS — I129 Hypertensive chronic kidney disease with stage 1 through stage 4 chronic kidney disease, or unspecified chronic kidney disease: Secondary | ICD-10-CM | POA: Diagnosis not present

## 2020-09-15 DIAGNOSIS — I872 Venous insufficiency (chronic) (peripheral): Secondary | ICD-10-CM | POA: Diagnosis not present

## 2020-09-17 DIAGNOSIS — W19XXXD Unspecified fall, subsequent encounter: Secondary | ICD-10-CM | POA: Diagnosis not present

## 2020-09-17 DIAGNOSIS — K219 Gastro-esophageal reflux disease without esophagitis: Secondary | ICD-10-CM | POA: Diagnosis not present

## 2020-09-17 DIAGNOSIS — I712 Thoracic aortic aneurysm, without rupture: Secondary | ICD-10-CM | POA: Diagnosis not present

## 2020-09-17 DIAGNOSIS — J438 Other emphysema: Secondary | ICD-10-CM | POA: Diagnosis not present

## 2020-09-17 DIAGNOSIS — I129 Hypertensive chronic kidney disease with stage 1 through stage 4 chronic kidney disease, or unspecified chronic kidney disease: Secondary | ICD-10-CM | POA: Diagnosis not present

## 2020-09-17 DIAGNOSIS — I872 Venous insufficiency (chronic) (peripheral): Secondary | ICD-10-CM | POA: Diagnosis not present

## 2020-09-17 DIAGNOSIS — N183 Chronic kidney disease, stage 3 unspecified: Secondary | ICD-10-CM | POA: Diagnosis not present

## 2020-09-17 DIAGNOSIS — R1310 Dysphagia, unspecified: Secondary | ICD-10-CM | POA: Diagnosis not present

## 2020-09-17 DIAGNOSIS — M6281 Muscle weakness (generalized): Secondary | ICD-10-CM | POA: Diagnosis not present

## 2020-09-18 ENCOUNTER — Other Ambulatory Visit: Payer: Medicare Other | Admitting: Nurse Practitioner

## 2020-09-18 ENCOUNTER — Telehealth: Payer: Self-pay

## 2020-09-18 ENCOUNTER — Other Ambulatory Visit: Payer: Self-pay

## 2020-09-18 DIAGNOSIS — K219 Gastro-esophageal reflux disease without esophagitis: Secondary | ICD-10-CM | POA: Diagnosis not present

## 2020-09-18 DIAGNOSIS — R1310 Dysphagia, unspecified: Secondary | ICD-10-CM | POA: Diagnosis not present

## 2020-09-18 DIAGNOSIS — M6281 Muscle weakness (generalized): Secondary | ICD-10-CM | POA: Diagnosis not present

## 2020-09-18 DIAGNOSIS — I712 Thoracic aortic aneurysm, without rupture: Secondary | ICD-10-CM | POA: Diagnosis not present

## 2020-09-18 DIAGNOSIS — N183 Chronic kidney disease, stage 3 unspecified: Secondary | ICD-10-CM | POA: Diagnosis not present

## 2020-09-18 DIAGNOSIS — I129 Hypertensive chronic kidney disease with stage 1 through stage 4 chronic kidney disease, or unspecified chronic kidney disease: Secondary | ICD-10-CM | POA: Diagnosis not present

## 2020-09-18 DIAGNOSIS — J438 Other emphysema: Secondary | ICD-10-CM | POA: Diagnosis not present

## 2020-09-18 DIAGNOSIS — I872 Venous insufficiency (chronic) (peripheral): Secondary | ICD-10-CM | POA: Diagnosis not present

## 2020-09-18 DIAGNOSIS — W19XXXD Unspecified fall, subsequent encounter: Secondary | ICD-10-CM | POA: Diagnosis not present

## 2020-09-18 NOTE — Telephone Encounter (Signed)
err

## 2020-09-19 DIAGNOSIS — M6281 Muscle weakness (generalized): Secondary | ICD-10-CM | POA: Diagnosis not present

## 2020-09-19 DIAGNOSIS — I129 Hypertensive chronic kidney disease with stage 1 through stage 4 chronic kidney disease, or unspecified chronic kidney disease: Secondary | ICD-10-CM | POA: Diagnosis not present

## 2020-09-19 DIAGNOSIS — J438 Other emphysema: Secondary | ICD-10-CM | POA: Diagnosis not present

## 2020-09-19 DIAGNOSIS — N183 Chronic kidney disease, stage 3 unspecified: Secondary | ICD-10-CM | POA: Diagnosis not present

## 2020-09-19 DIAGNOSIS — K219 Gastro-esophageal reflux disease without esophagitis: Secondary | ICD-10-CM | POA: Diagnosis not present

## 2020-09-19 DIAGNOSIS — I712 Thoracic aortic aneurysm, without rupture: Secondary | ICD-10-CM | POA: Diagnosis not present

## 2020-09-19 DIAGNOSIS — R1310 Dysphagia, unspecified: Secondary | ICD-10-CM | POA: Diagnosis not present

## 2020-09-19 DIAGNOSIS — W19XXXD Unspecified fall, subsequent encounter: Secondary | ICD-10-CM | POA: Diagnosis not present

## 2020-09-19 DIAGNOSIS — I872 Venous insufficiency (chronic) (peripheral): Secondary | ICD-10-CM | POA: Diagnosis not present

## 2020-09-20 ENCOUNTER — Telehealth: Payer: Self-pay

## 2020-09-20 DIAGNOSIS — N183 Chronic kidney disease, stage 3 unspecified: Secondary | ICD-10-CM | POA: Diagnosis not present

## 2020-09-20 DIAGNOSIS — I129 Hypertensive chronic kidney disease with stage 1 through stage 4 chronic kidney disease, or unspecified chronic kidney disease: Secondary | ICD-10-CM | POA: Diagnosis not present

## 2020-09-20 DIAGNOSIS — J438 Other emphysema: Secondary | ICD-10-CM | POA: Diagnosis not present

## 2020-09-20 DIAGNOSIS — I712 Thoracic aortic aneurysm, without rupture: Secondary | ICD-10-CM | POA: Diagnosis not present

## 2020-09-20 DIAGNOSIS — K219 Gastro-esophageal reflux disease without esophagitis: Secondary | ICD-10-CM | POA: Diagnosis not present

## 2020-09-20 DIAGNOSIS — W19XXXD Unspecified fall, subsequent encounter: Secondary | ICD-10-CM | POA: Diagnosis not present

## 2020-09-20 DIAGNOSIS — R1310 Dysphagia, unspecified: Secondary | ICD-10-CM | POA: Diagnosis not present

## 2020-09-20 DIAGNOSIS — I872 Venous insufficiency (chronic) (peripheral): Secondary | ICD-10-CM | POA: Diagnosis not present

## 2020-09-20 DIAGNOSIS — M6281 Muscle weakness (generalized): Secondary | ICD-10-CM | POA: Diagnosis not present

## 2020-09-20 NOTE — Chronic Care Management (AMB) (Signed)
Chronic Care Management Pharmacy Assistant   Name: Roger [name]  MRN: [id number] DOB: 1919-09-21  Reason for Encounter: Disease State/ General Adherence Call  PCP : [name], MD  Allergies:   Allergies  Allergen Reactions  . Amlodipine Besylate     REACTION: swelling of feet  . Aspirin     REACTION: GI upset can tolerate ecotrin  . Hydrocodone     GI upset    Medications: Outpatient Encounter Medications as of 09/20/2020  Medication Sig  . Desvenlafaxine Succinate ER 25 MG TB24 TAKE 25-50 MG BY MOUTH DAILY.  . furosemide (LASIX) 20 MG tablet TAKE 1 TABLET BY MOUTH  DAILY AS NEEDED FOR WEIGHT  GAIN OR INCREASED LEG  SWELLING  . INCRUSE ELLIPTA 62.5 MCG/INH AEPB TAKE 1 PUFF BY MOUTH EVERY DAY  . lovastatin (MEVACOR) 40 MG tablet TAKE 1 TABLET BY MOUTH  DAILY  . Multiple Vitamins-Minerals (MULTIVITAMIN WITH MINERALS) tablet Take 1 tablet by mouth daily.  . mupirocin ointment (BACTROBAN) 2 % Apply 1 application topically 2 (two) times daily.  [name] MYRBETRIQ 50 MG TB24 tablet Take 50 mg by mouth daily.  . pantoprazole (PROTONIX) 40 MG tablet Take 30- 60 min before your first and last meals of the day  . sertraline (ZOLOFT) 100 MG tablet Take 100 mg by mouth daily.  [name] UNABLE TO FIND Med Name: Med pass 120 mL by mouth twice daily for supplement  . [DISCONTINUED] diltiazem (CARDIZEM CD) 180 MG 24 hr capsule Take 180 mg by mouth daily.   . [DISCONTINUED] hydrochlorothiazide (MICROZIDE) 12.5 MG capsule Take 1 capsule (12.5 mg total) by mouth daily.   No facility-administered encounter medications on file as of 09/20/2020.    Current Diagnosis: Patient Active Problem List   Diagnosis Date Noted  . Dysphagia 06/06/2020  . Other emphysema ([place]) 05/14/2020  . Right hip pain 09/13/2018  . Laceration of scalp 10/17/2017  . Ascending aortic aneurysm ([place]) 10/17/2017  . Aortic atherosclerosis ([place]) 10/17/2017  . Bronchitis 08/29/2017  . Allergic conjunctivitis of both eyes  06/22/2017  . Onychomycosis 10/22/2016  . Diastolic dysfunction 66/01/3015  . Depression, major, single episode, complete remission ([place]) 07/05/2015  . History of pneumonia 03/10/2015  . Venous (peripheral) insufficiency 01/26/2015  . Chronic cough 10/11/2014  . CKD (chronic kidney disease), stage III ([place]) 07/13/2014  . Overactive bladder 07/13/2014  . Syncope 11/17/2013  . Macular degeneration 06/19/2011  . Osteoarthritis 06/05/2009  . CARCINOMA, SKIN, SQUAMOUS CELL 05/26/2008  . Hypertension 06/17/2007  . Hyperlipidemia 02/22/2007  . GERD (gastroesophageal reflux disease) 02/22/2007    Have you seen any other providers since your last visit with Madelin Rear, Pharm.D., BCGP?  07/30/2020 Campbell Stall, DPM, 08/14/2020 Wonda Cerise, MD We opted to try an SNRI Pristiq instead today.  Titration per AVS-Zoloft 25 mg and Pristiq 25 mg for 1 week and then transition to 50 mg of Pristiq alone.  Have you had any problems recently with your health?  Patients daughter states the patient has not had any problems recently with his health. Patient was busy with a home health nurse during the time of this call.  Have you had any problems with your pharmacy?  Patients daughter states the patient has not had any problems recently with his pharmacy.  What issues or side effects are you having with your medications?  Patients daughter states the patient has not had any side effects from his medications.  What would you like me  to pass along to Madelin Rear, [place].D., BCGP for them to help you with?   Patients daughters states she would like to inform Dr. Yong Channel that the patient has not been able to start medication Pristiq for depression and his mood due to her being busy and not being able to watch him while he is starting a new medication.   April D Calhoun, [place] Pharmacist Assistant [phone]    Follow-Up:  Pharmacist Review

## 2020-09-21 DIAGNOSIS — N183 Chronic kidney disease, stage 3 unspecified: Secondary | ICD-10-CM | POA: Diagnosis not present

## 2020-09-21 DIAGNOSIS — K219 Gastro-esophageal reflux disease without esophagitis: Secondary | ICD-10-CM | POA: Diagnosis not present

## 2020-09-21 DIAGNOSIS — J438 Other emphysema: Secondary | ICD-10-CM | POA: Diagnosis not present

## 2020-09-21 DIAGNOSIS — I872 Venous insufficiency (chronic) (peripheral): Secondary | ICD-10-CM | POA: Diagnosis not present

## 2020-09-21 DIAGNOSIS — W19XXXD Unspecified fall, subsequent encounter: Secondary | ICD-10-CM | POA: Diagnosis not present

## 2020-09-21 DIAGNOSIS — M6281 Muscle weakness (generalized): Secondary | ICD-10-CM | POA: Diagnosis not present

## 2020-09-21 DIAGNOSIS — I129 Hypertensive chronic kidney disease with stage 1 through stage 4 chronic kidney disease, or unspecified chronic kidney disease: Secondary | ICD-10-CM | POA: Diagnosis not present

## 2020-09-21 DIAGNOSIS — I712 Thoracic aortic aneurysm, without rupture: Secondary | ICD-10-CM | POA: Diagnosis not present

## 2020-09-21 DIAGNOSIS — R1310 Dysphagia, unspecified: Secondary | ICD-10-CM | POA: Diagnosis not present

## 2020-09-24 DIAGNOSIS — I129 Hypertensive chronic kidney disease with stage 1 through stage 4 chronic kidney disease, or unspecified chronic kidney disease: Secondary | ICD-10-CM | POA: Diagnosis not present

## 2020-09-24 DIAGNOSIS — J438 Other emphysema: Secondary | ICD-10-CM | POA: Diagnosis not present

## 2020-09-24 DIAGNOSIS — I712 Thoracic aortic aneurysm, without rupture: Secondary | ICD-10-CM | POA: Diagnosis not present

## 2020-09-24 DIAGNOSIS — N183 Chronic kidney disease, stage 3 unspecified: Secondary | ICD-10-CM | POA: Diagnosis not present

## 2020-09-24 DIAGNOSIS — R1310 Dysphagia, unspecified: Secondary | ICD-10-CM | POA: Diagnosis not present

## 2020-09-24 DIAGNOSIS — W19XXXD Unspecified fall, subsequent encounter: Secondary | ICD-10-CM | POA: Diagnosis not present

## 2020-09-24 DIAGNOSIS — K219 Gastro-esophageal reflux disease without esophagitis: Secondary | ICD-10-CM | POA: Diagnosis not present

## 2020-09-24 DIAGNOSIS — I872 Venous insufficiency (chronic) (peripheral): Secondary | ICD-10-CM | POA: Diagnosis not present

## 2020-09-24 DIAGNOSIS — M6281 Muscle weakness (generalized): Secondary | ICD-10-CM | POA: Diagnosis not present

## 2020-09-25 DIAGNOSIS — R1310 Dysphagia, unspecified: Secondary | ICD-10-CM | POA: Diagnosis not present

## 2020-09-25 DIAGNOSIS — J438 Other emphysema: Secondary | ICD-10-CM | POA: Diagnosis not present

## 2020-09-25 DIAGNOSIS — I872 Venous insufficiency (chronic) (peripheral): Secondary | ICD-10-CM | POA: Diagnosis not present

## 2020-09-25 DIAGNOSIS — M6281 Muscle weakness (generalized): Secondary | ICD-10-CM | POA: Diagnosis not present

## 2020-09-25 DIAGNOSIS — I712 Thoracic aortic aneurysm, without rupture: Secondary | ICD-10-CM | POA: Diagnosis not present

## 2020-09-25 DIAGNOSIS — I129 Hypertensive chronic kidney disease with stage 1 through stage 4 chronic kidney disease, or unspecified chronic kidney disease: Secondary | ICD-10-CM | POA: Diagnosis not present

## 2020-09-25 DIAGNOSIS — K219 Gastro-esophageal reflux disease without esophagitis: Secondary | ICD-10-CM | POA: Diagnosis not present

## 2020-09-25 DIAGNOSIS — W19XXXD Unspecified fall, subsequent encounter: Secondary | ICD-10-CM | POA: Diagnosis not present

## 2020-09-25 DIAGNOSIS — N183 Chronic kidney disease, stage 3 unspecified: Secondary | ICD-10-CM | POA: Diagnosis not present

## 2020-09-27 ENCOUNTER — Encounter (INDEPENDENT_AMBULATORY_CARE_PROVIDER_SITE_OTHER): Payer: Medicare Other | Admitting: Ophthalmology

## 2020-09-27 DIAGNOSIS — M6281 Muscle weakness (generalized): Secondary | ICD-10-CM | POA: Diagnosis not present

## 2020-09-27 DIAGNOSIS — I712 Thoracic aortic aneurysm, without rupture: Secondary | ICD-10-CM | POA: Diagnosis not present

## 2020-09-27 DIAGNOSIS — J438 Other emphysema: Secondary | ICD-10-CM | POA: Diagnosis not present

## 2020-09-27 DIAGNOSIS — I872 Venous insufficiency (chronic) (peripheral): Secondary | ICD-10-CM | POA: Diagnosis not present

## 2020-09-27 DIAGNOSIS — I129 Hypertensive chronic kidney disease with stage 1 through stage 4 chronic kidney disease, or unspecified chronic kidney disease: Secondary | ICD-10-CM | POA: Diagnosis not present

## 2020-09-27 DIAGNOSIS — R1310 Dysphagia, unspecified: Secondary | ICD-10-CM | POA: Diagnosis not present

## 2020-09-27 DIAGNOSIS — K219 Gastro-esophageal reflux disease without esophagitis: Secondary | ICD-10-CM | POA: Diagnosis not present

## 2020-09-27 DIAGNOSIS — N183 Chronic kidney disease, stage 3 unspecified: Secondary | ICD-10-CM | POA: Diagnosis not present

## 2020-09-27 DIAGNOSIS — W19XXXD Unspecified fall, subsequent encounter: Secondary | ICD-10-CM | POA: Diagnosis not present

## 2020-09-28 ENCOUNTER — Ambulatory Visit: Payer: Medicare Other | Admitting: Family Medicine

## 2020-09-28 ENCOUNTER — Ambulatory Visit: Payer: Medicare Other | Admitting: Internal Medicine

## 2020-09-28 DIAGNOSIS — R1310 Dysphagia, unspecified: Secondary | ICD-10-CM | POA: Diagnosis not present

## 2020-09-28 DIAGNOSIS — M6281 Muscle weakness (generalized): Secondary | ICD-10-CM | POA: Diagnosis not present

## 2020-09-28 DIAGNOSIS — J438 Other emphysema: Secondary | ICD-10-CM | POA: Diagnosis not present

## 2020-09-28 DIAGNOSIS — K219 Gastro-esophageal reflux disease without esophagitis: Secondary | ICD-10-CM | POA: Diagnosis not present

## 2020-09-28 DIAGNOSIS — I712 Thoracic aortic aneurysm, without rupture: Secondary | ICD-10-CM | POA: Diagnosis not present

## 2020-09-28 DIAGNOSIS — I872 Venous insufficiency (chronic) (peripheral): Secondary | ICD-10-CM | POA: Diagnosis not present

## 2020-09-28 DIAGNOSIS — W19XXXD Unspecified fall, subsequent encounter: Secondary | ICD-10-CM | POA: Diagnosis not present

## 2020-09-28 DIAGNOSIS — N183 Chronic kidney disease, stage 3 unspecified: Secondary | ICD-10-CM | POA: Diagnosis not present

## 2020-09-28 DIAGNOSIS — I129 Hypertensive chronic kidney disease with stage 1 through stage 4 chronic kidney disease, or unspecified chronic kidney disease: Secondary | ICD-10-CM | POA: Diagnosis not present

## 2020-10-01 DIAGNOSIS — N183 Chronic kidney disease, stage 3 unspecified: Secondary | ICD-10-CM | POA: Diagnosis not present

## 2020-10-01 DIAGNOSIS — R1310 Dysphagia, unspecified: Secondary | ICD-10-CM | POA: Diagnosis not present

## 2020-10-01 DIAGNOSIS — W19XXXD Unspecified fall, subsequent encounter: Secondary | ICD-10-CM | POA: Diagnosis not present

## 2020-10-01 DIAGNOSIS — I872 Venous insufficiency (chronic) (peripheral): Secondary | ICD-10-CM | POA: Diagnosis not present

## 2020-10-01 DIAGNOSIS — I129 Hypertensive chronic kidney disease with stage 1 through stage 4 chronic kidney disease, or unspecified chronic kidney disease: Secondary | ICD-10-CM | POA: Diagnosis not present

## 2020-10-01 DIAGNOSIS — I712 Thoracic aortic aneurysm, without rupture: Secondary | ICD-10-CM | POA: Diagnosis not present

## 2020-10-01 DIAGNOSIS — J438 Other emphysema: Secondary | ICD-10-CM | POA: Diagnosis not present

## 2020-10-01 DIAGNOSIS — M6281 Muscle weakness (generalized): Secondary | ICD-10-CM | POA: Diagnosis not present

## 2020-10-01 DIAGNOSIS — K219 Gastro-esophageal reflux disease without esophagitis: Secondary | ICD-10-CM | POA: Diagnosis not present

## 2020-10-02 DIAGNOSIS — I872 Venous insufficiency (chronic) (peripheral): Secondary | ICD-10-CM | POA: Diagnosis not present

## 2020-10-02 DIAGNOSIS — I712 Thoracic aortic aneurysm, without rupture: Secondary | ICD-10-CM | POA: Diagnosis not present

## 2020-10-02 DIAGNOSIS — N183 Chronic kidney disease, stage 3 unspecified: Secondary | ICD-10-CM | POA: Diagnosis not present

## 2020-10-02 DIAGNOSIS — J438 Other emphysema: Secondary | ICD-10-CM | POA: Diagnosis not present

## 2020-10-02 DIAGNOSIS — K219 Gastro-esophageal reflux disease without esophagitis: Secondary | ICD-10-CM | POA: Diagnosis not present

## 2020-10-02 DIAGNOSIS — I129 Hypertensive chronic kidney disease with stage 1 through stage 4 chronic kidney disease, or unspecified chronic kidney disease: Secondary | ICD-10-CM | POA: Diagnosis not present

## 2020-10-02 DIAGNOSIS — M6281 Muscle weakness (generalized): Secondary | ICD-10-CM | POA: Diagnosis not present

## 2020-10-02 DIAGNOSIS — R1310 Dysphagia, unspecified: Secondary | ICD-10-CM | POA: Diagnosis not present

## 2020-10-02 DIAGNOSIS — W19XXXD Unspecified fall, subsequent encounter: Secondary | ICD-10-CM | POA: Diagnosis not present

## 2020-10-03 ENCOUNTER — Telehealth: Payer: Self-pay | Admitting: Family Medicine

## 2020-10-03 MED ORDER — BUSPIRONE HCL 5 MG PO TABS: 5.0000 mg | ORAL_TABLET | Freq: Two times a day (BID) | ORAL | 2 refills | Status: DC | PRN

## 2020-10-03 MED ORDER — BUSPIRONE HCL 5 MG PO TABS: 5.0000 mg | ORAL_TABLET | Freq: Two times a day (BID) | ORAL | 2 refills | Status: AC | PRN

## 2020-10-03 NOTE — Telephone Encounter (Signed)
I thought patient situation over-lets try buspirone 5 mg up to twice a day as needed for anxiety which may present as agitation   I sent [facility] to the [facility] on [address].  I took Pristiq off the list as it was not affordable.  Ideally will not increase Zoloft due to risk of QT prolongation

## 2020-10-04 ENCOUNTER — Other Ambulatory Visit: Payer: Self-pay | Admitting: Family Medicine

## 2020-10-04 ENCOUNTER — Other Ambulatory Visit: Payer: Self-pay | Admitting: Internal Medicine

## 2020-10-04 DIAGNOSIS — J438 Other emphysema: Secondary | ICD-10-CM | POA: Diagnosis not present

## 2020-10-04 DIAGNOSIS — N183 Chronic kidney disease, stage 3 unspecified: Secondary | ICD-10-CM | POA: Diagnosis not present

## 2020-10-04 DIAGNOSIS — I129 Hypertensive chronic kidney disease with stage 1 through stage 4 chronic kidney disease, or unspecified chronic kidney disease: Secondary | ICD-10-CM | POA: Diagnosis not present

## 2020-10-04 DIAGNOSIS — R1310 Dysphagia, unspecified: Secondary | ICD-10-CM | POA: Diagnosis not present

## 2020-10-04 DIAGNOSIS — I712 Thoracic aortic aneurysm, without rupture: Secondary | ICD-10-CM | POA: Diagnosis not present

## 2020-10-04 DIAGNOSIS — K219 Gastro-esophageal reflux disease without esophagitis: Secondary | ICD-10-CM | POA: Diagnosis not present

## 2020-10-04 DIAGNOSIS — R053 Chronic cough: Secondary | ICD-10-CM

## 2020-10-04 DIAGNOSIS — M6281 Muscle weakness (generalized): Secondary | ICD-10-CM | POA: Diagnosis not present

## 2020-10-04 DIAGNOSIS — W19XXXD Unspecified fall, subsequent encounter: Secondary | ICD-10-CM | POA: Diagnosis not present

## 2020-10-04 DIAGNOSIS — I872 Venous insufficiency (chronic) (peripheral): Secondary | ICD-10-CM | POA: Diagnosis not present

## 2020-10-04 NOTE — Telephone Encounter (Signed)
Called and lm for [name] tcb. 

## 2020-10-05 DIAGNOSIS — W19XXXD Unspecified fall, subsequent encounter: Secondary | ICD-10-CM | POA: Diagnosis not present

## 2020-10-05 DIAGNOSIS — I872 Venous insufficiency (chronic) (peripheral): Secondary | ICD-10-CM | POA: Diagnosis not present

## 2020-10-05 DIAGNOSIS — M6281 Muscle weakness (generalized): Secondary | ICD-10-CM | POA: Diagnosis not present

## 2020-10-05 DIAGNOSIS — N183 Chronic kidney disease, stage 3 unspecified: Secondary | ICD-10-CM | POA: Diagnosis not present

## 2020-10-05 DIAGNOSIS — K219 Gastro-esophageal reflux disease without esophagitis: Secondary | ICD-10-CM | POA: Diagnosis not present

## 2020-10-05 DIAGNOSIS — I129 Hypertensive chronic kidney disease with stage 1 through stage 4 chronic kidney disease, or unspecified chronic kidney disease: Secondary | ICD-10-CM | POA: Diagnosis not present

## 2020-10-05 DIAGNOSIS — R1310 Dysphagia, unspecified: Secondary | ICD-10-CM | POA: Diagnosis not present

## 2020-10-05 DIAGNOSIS — J438 Other emphysema: Secondary | ICD-10-CM | POA: Diagnosis not present

## 2020-10-05 DIAGNOSIS — I712 Thoracic aortic aneurysm, without rupture: Secondary | ICD-10-CM | POA: Diagnosis not present

## 2020-10-08 ENCOUNTER — Ambulatory Visit: Payer: Medicare Other | Admitting: Family Medicine

## 2020-10-08 ENCOUNTER — Telehealth: Payer: Self-pay

## 2020-10-08 DIAGNOSIS — N183 Chronic kidney disease, stage 3 unspecified: Secondary | ICD-10-CM | POA: Diagnosis not present

## 2020-10-08 DIAGNOSIS — I872 Venous insufficiency (chronic) (peripheral): Secondary | ICD-10-CM | POA: Diagnosis not present

## 2020-10-08 DIAGNOSIS — K219 Gastro-esophageal reflux disease without esophagitis: Secondary | ICD-10-CM | POA: Diagnosis not present

## 2020-10-08 DIAGNOSIS — I712 Thoracic aortic aneurysm, without rupture: Secondary | ICD-10-CM | POA: Diagnosis not present

## 2020-10-08 DIAGNOSIS — R1310 Dysphagia, unspecified: Secondary | ICD-10-CM | POA: Diagnosis not present

## 2020-10-08 DIAGNOSIS — W19XXXD Unspecified fall, subsequent encounter: Secondary | ICD-10-CM | POA: Diagnosis not present

## 2020-10-08 DIAGNOSIS — I129 Hypertensive chronic kidney disease with stage 1 through stage 4 chronic kidney disease, or unspecified chronic kidney disease: Secondary | ICD-10-CM | POA: Diagnosis not present

## 2020-10-08 DIAGNOSIS — M6281 Muscle weakness (generalized): Secondary | ICD-10-CM | POA: Diagnosis not present

## 2020-10-08 DIAGNOSIS — J438 Other emphysema: Secondary | ICD-10-CM | POA: Diagnosis not present

## 2020-10-08 NOTE — Telephone Encounter (Signed)
Physical Therapist from [facility] called to let Dr. Yong Channel know that pt fell last night. Pt has no injuries

## 2020-10-09 DIAGNOSIS — N183 Chronic kidney disease, stage 3 unspecified: Secondary | ICD-10-CM | POA: Diagnosis not present

## 2020-10-09 DIAGNOSIS — I129 Hypertensive chronic kidney disease with stage 1 through stage 4 chronic kidney disease, or unspecified chronic kidney disease: Secondary | ICD-10-CM | POA: Diagnosis not present

## 2020-10-09 DIAGNOSIS — I712 Thoracic aortic aneurysm, without rupture: Secondary | ICD-10-CM | POA: Diagnosis not present

## 2020-10-09 DIAGNOSIS — W19XXXD Unspecified fall, subsequent encounter: Secondary | ICD-10-CM | POA: Diagnosis not present

## 2020-10-09 DIAGNOSIS — R1310 Dysphagia, unspecified: Secondary | ICD-10-CM | POA: Diagnosis not present

## 2020-10-09 DIAGNOSIS — K219 Gastro-esophageal reflux disease without esophagitis: Secondary | ICD-10-CM | POA: Diagnosis not present

## 2020-10-09 DIAGNOSIS — I872 Venous insufficiency (chronic) (peripheral): Secondary | ICD-10-CM | POA: Diagnosis not present

## 2020-10-09 DIAGNOSIS — J438 Other emphysema: Secondary | ICD-10-CM | POA: Diagnosis not present

## 2020-10-09 DIAGNOSIS — M6281 Muscle weakness (generalized): Secondary | ICD-10-CM | POA: Diagnosis not present

## 2020-10-09 NOTE — Telephone Encounter (Signed)
Glad no injuries- tough situation as looking to transfer to facility where he can get more assistance

## 2020-10-09 NOTE — Telephone Encounter (Signed)
FYI

## 2020-10-11 DIAGNOSIS — K219 Gastro-esophageal reflux disease without esophagitis: Secondary | ICD-10-CM | POA: Diagnosis not present

## 2020-10-11 DIAGNOSIS — I712 Thoracic aortic aneurysm, without rupture: Secondary | ICD-10-CM | POA: Diagnosis not present

## 2020-10-11 DIAGNOSIS — N183 Chronic kidney disease, stage 3 unspecified: Secondary | ICD-10-CM | POA: Diagnosis not present

## 2020-10-11 DIAGNOSIS — I872 Venous insufficiency (chronic) (peripheral): Secondary | ICD-10-CM | POA: Diagnosis not present

## 2020-10-11 DIAGNOSIS — J438 Other emphysema: Secondary | ICD-10-CM | POA: Diagnosis not present

## 2020-10-11 DIAGNOSIS — R1310 Dysphagia, unspecified: Secondary | ICD-10-CM | POA: Diagnosis not present

## 2020-10-11 DIAGNOSIS — W19XXXD Unspecified fall, subsequent encounter: Secondary | ICD-10-CM | POA: Diagnosis not present

## 2020-10-11 DIAGNOSIS — I129 Hypertensive chronic kidney disease with stage 1 through stage 4 chronic kidney disease, or unspecified chronic kidney disease: Secondary | ICD-10-CM | POA: Diagnosis not present

## 2020-10-11 DIAGNOSIS — M6281 Muscle weakness (generalized): Secondary | ICD-10-CM | POA: Diagnosis not present

## 2020-10-16 ENCOUNTER — Ambulatory Visit: Payer: Medicare Other | Admitting: Family Medicine

## 2020-10-16 ENCOUNTER — Telehealth: Payer: Self-pay

## 2020-10-16 DIAGNOSIS — M6281 Muscle weakness (generalized): Secondary | ICD-10-CM | POA: Diagnosis not present

## 2020-10-16 DIAGNOSIS — I129 Hypertensive chronic kidney disease with stage 1 through stage 4 chronic kidney disease, or unspecified chronic kidney disease: Secondary | ICD-10-CM | POA: Diagnosis not present

## 2020-10-16 DIAGNOSIS — R1310 Dysphagia, unspecified: Secondary | ICD-10-CM | POA: Diagnosis not present

## 2020-10-16 DIAGNOSIS — J438 Other emphysema: Secondary | ICD-10-CM | POA: Diagnosis not present

## 2020-10-16 DIAGNOSIS — K219 Gastro-esophageal reflux disease without esophagitis: Secondary | ICD-10-CM | POA: Diagnosis not present

## 2020-10-16 DIAGNOSIS — I712 Thoracic aortic aneurysm, without rupture: Secondary | ICD-10-CM | POA: Diagnosis not present

## 2020-10-16 DIAGNOSIS — I872 Venous insufficiency (chronic) (peripheral): Secondary | ICD-10-CM | POA: Diagnosis not present

## 2020-10-16 DIAGNOSIS — N183 Chronic kidney disease, stage 3 unspecified: Secondary | ICD-10-CM | POA: Diagnosis not present

## 2020-10-16 DIAGNOSIS — W19XXXD Unspecified fall, subsequent encounter: Secondary | ICD-10-CM | POA: Diagnosis not present

## 2020-10-16 NOTE — Telephone Encounter (Signed)
Type of forms received:[employer]M [employer]   Routed to: [place] received by Trecia Rogers requesting form]:  [facility] / [facility]UN[facility]    Individual made aware of 3-5 business day turn around (Y/N):  Y  Faxed to : Florian Buff [phone] [place]   Form location: [place]

## 2020-10-18 ENCOUNTER — Telehealth: Payer: Self-pay

## 2020-10-18 DIAGNOSIS — W19XXXD Unspecified fall, subsequent encounter: Secondary | ICD-10-CM | POA: Diagnosis not present

## 2020-10-18 DIAGNOSIS — I872 Venous insufficiency (chronic) (peripheral): Secondary | ICD-10-CM | POA: Diagnosis not present

## 2020-10-18 DIAGNOSIS — K219 Gastro-esophageal reflux disease without esophagitis: Secondary | ICD-10-CM | POA: Diagnosis not present

## 2020-10-18 DIAGNOSIS — R1310 Dysphagia, unspecified: Secondary | ICD-10-CM | POA: Diagnosis not present

## 2020-10-18 DIAGNOSIS — M6281 Muscle weakness (generalized): Secondary | ICD-10-CM | POA: Diagnosis not present

## 2020-10-18 DIAGNOSIS — I712 Thoracic aortic aneurysm, without rupture: Secondary | ICD-10-CM | POA: Diagnosis not present

## 2020-10-18 DIAGNOSIS — I129 Hypertensive chronic kidney disease with stage 1 through stage 4 chronic kidney disease, or unspecified chronic kidney disease: Secondary | ICD-10-CM | POA: Diagnosis not present

## 2020-10-18 DIAGNOSIS — N183 Chronic kidney disease, stage 3 unspecified: Secondary | ICD-10-CM | POA: Diagnosis not present

## 2020-10-18 DIAGNOSIS — J438 Other emphysema: Secondary | ICD-10-CM | POA: Diagnosis not present

## 2020-10-18 NOTE — Telephone Encounter (Signed)
[facility] is requesting a continuation of treatment as they have noticed patient has not met goals and has become a bit weaker.

## 2020-10-19 NOTE — Telephone Encounter (Signed)
Called and lm on [place] VM with VO.

## 2020-10-22 NOTE — Telephone Encounter (Signed)
Pt.'s daughter is following up on this. Pt can not go into assisted living til forms are complete

## 2020-10-23 ENCOUNTER — Telehealth: Payer: Self-pay

## 2020-10-23 DIAGNOSIS — I872 Venous insufficiency (chronic) (peripheral): Secondary | ICD-10-CM | POA: Diagnosis not present

## 2020-10-23 DIAGNOSIS — I129 Hypertensive chronic kidney disease with stage 1 through stage 4 chronic kidney disease, or unspecified chronic kidney disease: Secondary | ICD-10-CM | POA: Diagnosis not present

## 2020-10-23 DIAGNOSIS — I712 Thoracic aortic aneurysm, without rupture: Secondary | ICD-10-CM | POA: Diagnosis not present

## 2020-10-23 DIAGNOSIS — R1310 Dysphagia, unspecified: Secondary | ICD-10-CM | POA: Diagnosis not present

## 2020-10-23 DIAGNOSIS — J438 Other emphysema: Secondary | ICD-10-CM | POA: Diagnosis not present

## 2020-10-23 DIAGNOSIS — K219 Gastro-esophageal reflux disease without esophagitis: Secondary | ICD-10-CM | POA: Diagnosis not present

## 2020-10-23 DIAGNOSIS — M6281 Muscle weakness (generalized): Secondary | ICD-10-CM | POA: Diagnosis not present

## 2020-10-23 DIAGNOSIS — N183 Chronic kidney disease, stage 3 unspecified: Secondary | ICD-10-CM | POA: Diagnosis not present

## 2020-10-23 DIAGNOSIS — R600 Localized edema: Secondary | ICD-10-CM | POA: Diagnosis not present

## 2020-10-23 NOTE — Telephone Encounter (Signed)
We have the paperwork for the patient, But we need them to come in to have a tb skin test done. I have talked to the patients daughter but she hasn't gotten back to me yet. Will call again today to try and get him scheduled.

## 2020-10-24 ENCOUNTER — Telehealth: Payer: Self-pay

## 2020-10-24 NOTE — Telephone Encounter (Signed)
Unable to reach patients daughter. [place]

## 2020-10-24 NOTE — Telephone Encounter (Signed)
Spoke with Pt's daughter and tried to explain to her that her parents needed TB SKIN TESTING but she states that [place] from the home that they will be in states that they can have the blood work done. I advised her that I would call the facility to make sure and give her a call back. I call the facility to speak with someone, no luck so I LMTRC.

## 2020-10-24 NOTE — Telephone Encounter (Signed)
Patient and his wife has been scheduled for the blood TB testing.

## 2020-10-24 NOTE — Telephone Encounter (Signed)
Hey! Can you follow up with pt daughter regarding forms?

## 2020-10-24 NOTE — Telephone Encounter (Signed)
Pt daughter called asking if pt and his wife could get the PCR covid test done here. The assisted living community is requiring both pt and his wife to get tested. Please advise.

## 2020-10-25 ENCOUNTER — Other Ambulatory Visit: Payer: Medicare Other

## 2020-10-25 ENCOUNTER — Other Ambulatory Visit: Payer: Self-pay

## 2020-10-25 DIAGNOSIS — Z20822 Contact with and (suspected) exposure to covid-19: Secondary | ICD-10-CM | POA: Diagnosis not present

## 2020-10-25 DIAGNOSIS — Z111 Encounter for screening for respiratory tuberculosis: Secondary | ICD-10-CM

## 2020-10-25 NOTE — Telephone Encounter (Signed)
Can you get pt scheduled for COVID clinic please?

## 2020-10-25 NOTE — Telephone Encounter (Signed)
Tried calling the patient to see if they still needed covid testing.

## 2020-10-25 NOTE — Telephone Encounter (Signed)
We have the Covid testing clinic-certainly does not need an appointment with me-can direct them how to do this online or help set them up

## 2020-10-25 NOTE — Telephone Encounter (Signed)
Ok to do without an OV?

## 2020-10-26 DIAGNOSIS — R600 Localized edema: Secondary | ICD-10-CM | POA: Diagnosis not present

## 2020-10-26 DIAGNOSIS — M6281 Muscle weakness (generalized): Secondary | ICD-10-CM | POA: Diagnosis not present

## 2020-10-26 DIAGNOSIS — N183 Chronic kidney disease, stage 3 unspecified: Secondary | ICD-10-CM | POA: Diagnosis not present

## 2020-10-26 DIAGNOSIS — K219 Gastro-esophageal reflux disease without esophagitis: Secondary | ICD-10-CM | POA: Diagnosis not present

## 2020-10-26 DIAGNOSIS — J438 Other emphysema: Secondary | ICD-10-CM | POA: Diagnosis not present

## 2020-10-26 DIAGNOSIS — R1310 Dysphagia, unspecified: Secondary | ICD-10-CM | POA: Diagnosis not present

## 2020-10-26 DIAGNOSIS — I872 Venous insufficiency (chronic) (peripheral): Secondary | ICD-10-CM | POA: Diagnosis not present

## 2020-10-26 DIAGNOSIS — I129 Hypertensive chronic kidney disease with stage 1 through stage 4 chronic kidney disease, or unspecified chronic kidney disease: Secondary | ICD-10-CM | POA: Diagnosis not present

## 2020-10-26 DIAGNOSIS — I712 Thoracic aortic aneurysm, without rupture: Secondary | ICD-10-CM | POA: Diagnosis not present

## 2020-10-27 LAB — QUANTIFERON-TB GOLD PLUS
Mitogen-NIL: 10 IU/mL
NIL: 0.03 IU/mL
QuantiFERON-TB Gold Plus: NEGATIVE
TB1-NIL: 0 IU/mL
TB2-NIL: 0 IU/mL

## 2020-10-30 ENCOUNTER — Ambulatory Visit: Payer: Medicare Other | Admitting: Podiatry

## 2020-10-31 DIAGNOSIS — R1310 Dysphagia, unspecified: Secondary | ICD-10-CM | POA: Diagnosis not present

## 2020-10-31 DIAGNOSIS — I872 Venous insufficiency (chronic) (peripheral): Secondary | ICD-10-CM | POA: Diagnosis not present

## 2020-10-31 DIAGNOSIS — M6281 Muscle weakness (generalized): Secondary | ICD-10-CM | POA: Diagnosis not present

## 2020-10-31 DIAGNOSIS — I712 Thoracic aortic aneurysm, without rupture: Secondary | ICD-10-CM | POA: Diagnosis not present

## 2020-10-31 DIAGNOSIS — J438 Other emphysema: Secondary | ICD-10-CM | POA: Diagnosis not present

## 2020-10-31 DIAGNOSIS — I129 Hypertensive chronic kidney disease with stage 1 through stage 4 chronic kidney disease, or unspecified chronic kidney disease: Secondary | ICD-10-CM | POA: Diagnosis not present

## 2020-10-31 DIAGNOSIS — K219 Gastro-esophageal reflux disease without esophagitis: Secondary | ICD-10-CM | POA: Diagnosis not present

## 2020-10-31 DIAGNOSIS — N183 Chronic kidney disease, stage 3 unspecified: Secondary | ICD-10-CM | POA: Diagnosis not present

## 2020-10-31 DIAGNOSIS — R600 Localized edema: Secondary | ICD-10-CM | POA: Diagnosis not present

## 2020-11-05 DIAGNOSIS — J438 Other emphysema: Secondary | ICD-10-CM | POA: Diagnosis not present

## 2020-11-05 DIAGNOSIS — R600 Localized edema: Secondary | ICD-10-CM | POA: Diagnosis not present

## 2020-11-05 DIAGNOSIS — K219 Gastro-esophageal reflux disease without esophagitis: Secondary | ICD-10-CM | POA: Diagnosis not present

## 2020-11-05 DIAGNOSIS — N183 Chronic kidney disease, stage 3 unspecified: Secondary | ICD-10-CM | POA: Diagnosis not present

## 2020-11-05 DIAGNOSIS — I712 Thoracic aortic aneurysm, without rupture: Secondary | ICD-10-CM | POA: Diagnosis not present

## 2020-11-05 DIAGNOSIS — R1310 Dysphagia, unspecified: Secondary | ICD-10-CM | POA: Diagnosis not present

## 2020-11-05 DIAGNOSIS — I872 Venous insufficiency (chronic) (peripheral): Secondary | ICD-10-CM | POA: Diagnosis not present

## 2020-11-05 DIAGNOSIS — M6281 Muscle weakness (generalized): Secondary | ICD-10-CM | POA: Diagnosis not present

## 2020-11-05 DIAGNOSIS — I129 Hypertensive chronic kidney disease with stage 1 through stage 4 chronic kidney disease, or unspecified chronic kidney disease: Secondary | ICD-10-CM | POA: Diagnosis not present

## 2020-11-12 DIAGNOSIS — N183 Chronic kidney disease, stage 3 unspecified: Secondary | ICD-10-CM | POA: Diagnosis not present

## 2020-11-12 DIAGNOSIS — M6281 Muscle weakness (generalized): Secondary | ICD-10-CM | POA: Diagnosis not present

## 2020-11-12 DIAGNOSIS — J438 Other emphysema: Secondary | ICD-10-CM | POA: Diagnosis not present

## 2020-11-12 DIAGNOSIS — I129 Hypertensive chronic kidney disease with stage 1 through stage 4 chronic kidney disease, or unspecified chronic kidney disease: Secondary | ICD-10-CM | POA: Diagnosis not present

## 2020-11-12 DIAGNOSIS — R600 Localized edema: Secondary | ICD-10-CM | POA: Diagnosis not present

## 2020-11-12 DIAGNOSIS — R1310 Dysphagia, unspecified: Secondary | ICD-10-CM | POA: Diagnosis not present

## 2020-11-12 DIAGNOSIS — K219 Gastro-esophageal reflux disease without esophagitis: Secondary | ICD-10-CM | POA: Diagnosis not present

## 2020-11-12 DIAGNOSIS — I712 Thoracic aortic aneurysm, without rupture: Secondary | ICD-10-CM | POA: Diagnosis not present

## 2020-11-12 DIAGNOSIS — I872 Venous insufficiency (chronic) (peripheral): Secondary | ICD-10-CM | POA: Diagnosis not present

## 2020-11-13 ENCOUNTER — Telehealth: Payer: Self-pay

## 2020-11-13 NOTE — Telephone Encounter (Signed)
Yes thanks. You can write on pad and stamp and send.

## 2020-11-13 NOTE — Telephone Encounter (Signed)
[facility] is requesting an order that states it is okay to give medication with applesauce.

## 2020-11-13 NOTE — Telephone Encounter (Signed)
Fax number is [phone]

## 2020-11-13 NOTE — Telephone Encounter (Signed)
Ok to give written order?

## 2020-11-15 ENCOUNTER — Telehealth: Payer: Self-pay

## 2020-11-15 NOTE — Telephone Encounter (Signed)
Please schedule pt f/u for pain in legs. I called and spoke with the med tech and she states that pt told her his legs hurt and she has noticed swelling so she thinks It best for pt to be seen and evaluated.

## 2020-11-15 NOTE — Telephone Encounter (Signed)
See below

## 2020-11-15 NOTE — Telephone Encounter (Signed)
Script has been faxed

## 2020-11-15 NOTE — Telephone Encounter (Signed)
[facility] is requesting something for pain in Pt.'s legs.   Fax [phone]

## 2020-11-15 NOTE — Telephone Encounter (Signed)
Lvm for patient to call back and schedule appt  

## 2020-11-15 NOTE — Telephone Encounter (Signed)
Advised of need for visit.  If declines and this is only a mild issue may write order for Tylenol 650 mg every 6 hours as needed for pain

## 2020-11-19 ENCOUNTER — Telehealth: Payer: Self-pay

## 2020-11-19 NOTE — Telephone Encounter (Signed)
Called and lm for [facility].

## 2020-11-19 NOTE — Telephone Encounter (Signed)
Do they have speech therapy there or can we order home health speech therapy under trouble swallowing?

## 2020-11-19 NOTE — Telephone Encounter (Signed)
Returned call and spoke with [employer] and she states if we send a order to fax [phone] they can take care of the speech therapy for pt.

## 2020-11-19 NOTE — Telephone Encounter (Signed)
You may send an order for this to them

## 2020-11-19 NOTE — Telephone Encounter (Signed)
Pt's daughter was told by [facility] to call and request a new wheelchair for the pt. He now has a hammock like seat in his wheelchair, but needs something firmer since he is in his wheelchair all day.  Daughter wants a callback in regards to this

## 2020-11-19 NOTE — Telephone Encounter (Signed)
FYI

## 2020-11-19 NOTE — Telephone Encounter (Signed)
Aissatou called from [facility]  and just wanted to inform us patient is still having difficulty swallowing and cannot take his medications he even tried with applesauce.

## 2020-11-20 NOTE — Telephone Encounter (Signed)
Spoke with pt and they state they are coming in Monday for an appointment with Dr. Yong Channel and they want a Rx for a wheelchair with a hard seat to give him more support. Will discuss further on Monday but states a Rx can be faxed to [facility] to get the ball rolling? Is this ok to go ahead and do Dr. Yong Channel? If so, Rosanne Sack can you write the script and fax to [facility]?

## 2020-11-20 NOTE — Telephone Encounter (Signed)
Can write prescription under generalized weakness and fax

## 2020-11-21 ENCOUNTER — Telehealth: Payer: Self-pay

## 2020-11-21 DIAGNOSIS — M6281 Muscle weakness (generalized): Secondary | ICD-10-CM | POA: Diagnosis not present

## 2020-11-21 DIAGNOSIS — I129 Hypertensive chronic kidney disease with stage 1 through stage 4 chronic kidney disease, or unspecified chronic kidney disease: Secondary | ICD-10-CM | POA: Diagnosis not present

## 2020-11-21 DIAGNOSIS — N183 Chronic kidney disease, stage 3 unspecified: Secondary | ICD-10-CM | POA: Diagnosis not present

## 2020-11-21 DIAGNOSIS — J438 Other emphysema: Secondary | ICD-10-CM | POA: Diagnosis not present

## 2020-11-21 DIAGNOSIS — R600 Localized edema: Secondary | ICD-10-CM | POA: Diagnosis not present

## 2020-11-21 DIAGNOSIS — R1310 Dysphagia, unspecified: Secondary | ICD-10-CM | POA: Diagnosis not present

## 2020-11-21 DIAGNOSIS — I872 Venous insufficiency (chronic) (peripheral): Secondary | ICD-10-CM | POA: Diagnosis not present

## 2020-11-21 DIAGNOSIS — K219 Gastro-esophageal reflux disease without esophagitis: Secondary | ICD-10-CM | POA: Diagnosis not present

## 2020-11-21 DIAGNOSIS — I712 Thoracic aortic aneurysm, without rupture: Secondary | ICD-10-CM | POA: Diagnosis not present

## 2020-11-21 NOTE — Telephone Encounter (Signed)
Order written and faxed to [facility].

## 2020-11-21 NOTE — Telephone Encounter (Signed)
Roger [name], Physical Therapist from [facility] called wanting to make Dr. Yong Channel aware that they are going to continue PT with pt for once a week x 6 weeks.

## 2020-11-21 NOTE — Telephone Encounter (Signed)
Noted  

## 2020-11-21 NOTE — Telephone Encounter (Signed)
Order has been faxed to [facility] [phone].

## 2020-11-22 NOTE — Progress Notes (Signed)
Phone [phone] Virtual visit via Video note   Subjective:  Chief complaint: Chief Complaint  Patient presents with  . Leg Pain  . Depression   This visit type was conducted due to national recommendations for restrictions regarding the COVID-19 Pandemic (e.g. social distancing).  This format is felt to be most appropriate for this patient at this time balancing risks to patient and risks to population by having him in for in person visit.  No physical exam was performed (except for noted visual exam or audio findings with Telehealth visits).    Our team/I connected with Gwen Her Bartolotta at 10:00 AM EDT by a video enabled telemedicine application (doxy.me or caregility through epic) and verified that I am speaking with the correct person using two identifiers.  Location patient: Home-[place] Location provider: [facility], office Persons participating in the virtual visit:  patient  Our team/I discussed the limitations of evaluation and management by telemedicine and the availability of in person appointments. In light of current covid-19 pandemic, patient also understands that we are trying to protect them by minimizing in office contact if at all possible.  The patient expressed consent for telemedicine visit and agreed to proceed. Patient understands insurance will be billed.   Past Medical History-  Patient Active Problem List   Diagnosis Date Noted  . Dysphagia 06/06/2020    Priority: High  . Ascending aortic aneurysm ([place]) 10/17/2017    Priority: High  . Diastolic dysfunction 32/67/1245    Priority: High  . Overactive bladder 07/13/2014    Priority: High  . Allergic conjunctivitis of both eyes 06/22/2017    Priority: Medium  . Depression, major, single episode, complete remission ([place]) 07/05/2015    Priority: Medium  . History of pneumonia 03/10/2015    Priority: Medium  . Chronic cough 10/11/2014    Priority: Medium  . CKD (chronic kidney disease), stage III ([place])  07/13/2014    Priority: Medium  . Syncope 11/17/2013    Priority: Medium  . Hypertension 06/17/2007    Priority: Medium  . Hyperlipidemia 02/22/2007    Priority: Medium  . Aortic atherosclerosis ([place]) 10/17/2017    Priority: Low  . Bronchitis 08/29/2017    Priority: Low  . Venous (peripheral) insufficiency 01/26/2015    Priority: Low  . Macular degeneration 06/19/2011    Priority: Low  . Osteoarthritis 06/05/2009    Priority: Low  . CARCINOMA, SKIN, SQUAMOUS CELL 05/26/2008    Priority: Low  . GERD (gastroesophageal reflux disease) 02/22/2007    Priority: Low  . Other emphysema ([place]) 05/14/2020  . Right hip pain 09/13/2018  . Laceration of scalp 10/17/2017  . Onychomycosis 10/22/2016    Medications- reviewed and updated Current Outpatient Medications  Medication Sig Dispense Refill  . busPIRone (BUSPAR) 5 MG tablet Take 1 tablet (5 mg total) by mouth 2 (two) times daily as needed (Anxiety/agitation). 60 tablet 2  . furosemide (LASIX) 20 MG tablet TAKE 1 TABLET BY MOUTH  DAILY AS NEEDED FOR WEIGHT  GAIN OR INCREASED LEG  SWELLING 90 tablet 3  . INCRUSE ELLIPTA 62.5 MCG/INH AEPB TAKE 1 PUFF BY MOUTH EVERY DAY 30 each 5  . lovastatin (MEVACOR) 40 MG tablet TAKE 1 TABLET BY MOUTH  DAILY 90 tablet 3  . mirtazapine (REMERON) 7.5 MG tablet Take 1 tablet (7.5 mg total) by mouth at bedtime. 30 tablet 5  . Multiple Vitamins-Minerals (MULTIVITAMIN WITH MINERALS) tablet Take 1 tablet by mouth daily.    [name] MYRBETRIQ 50 MG TB24 tablet  Take 50 mg by mouth daily.    . pantoprazole (PROTONIX) 40 MG tablet TAKE 1 TABLET BY MOUTH 30  TO 60 MINUTES BEFORE YOUR  FIRST AND LAST MEALS OF THE DAY 180 tablet 2  . UNABLE TO FIND Med Name: Med pass 120 mL by mouth twice daily for supplement    . lidocaine (LIDODERM) 5 % Place 1 patch onto the skin daily. For leg or knee pain. Remove & Discard patch within 12 hours or as directed by MD 30 patch 5  . mupirocin ointment (BACTROBAN) 2 % Apply 1 application  topically 2 (two) times daily. (Patient not taking: Reported on 11/26/2020) 30 g 2   No current facility-administered medications for this visit.     Objective:  No  self reported vitals Gen: NAD, resting comfortably in chair but appears down Hoarse voice Lungs: nonlabored, normal respiratory rate  Skin: appears dry, no obvious rash     Assessment and Plan     Leg/Knee Pain F/U S: patient complaining of knee pain- wants to try lidocaine patches- worse at night. voltaren gel available.   A/P: Knee pain from arthritis-  Start with voltaren gel and if not effective can try lidocaine patches  # Generalized weakness S:Patient has been using wheelchair for longer distances for many years >6 years. He has become more dependent on this and can only do short transfers at this point such as from chair to bed or bed to chair. Patient has worked with physical therapy and they have suggested a hard seat, not hammock style due to prolonged use of wheelchair  A/P: we have written a prescription for new wheelchair with generalized weakness (patient clearly needs and needs non hammock seat with prolonged use)  and this note will be faxed to  [facility]  from [place]  Fax number is [phone]  # Depression S: Medication: zoloft 100 mg does not seem to be effective even with buspirone 5 mg as needed for agitation.  Trying to get his family to take him back home from facility. This is a much safer set up for him. Not overly agitated anymore with buspirone. pristiq was too expensive and lexapro was not effective in past.  A/P: poor control- only partial remission- scores 3 and 3 for anhedonia and depression. pristiq still barrier due to cost. We will try remeron add on to zoloft 100mg  and buspirone. I still think overall risk of serotonin syndrome is low- if this is not effective may change to pristiq still (family would be willing to pay at that point due to how poorly he is feeling). Has had some thoughts  of self harm at times but not persistent and with no plan- agrees to reach out immediately if progessive thoughts.    #therapy- not able to switch over to [facility] just for speech therapy at the moment- continuing PT with them. Cough more with history of aspiration- more phlegm - will refer speech therapy back out - referral today under dphagia -does not sound like on right diet- working on soft moist foods- family is working to get this set up.    Recommended follow up: 4-6 weeks discussed  Lab/Order associations:   ICD-10-CM   1. Dysphagia, unspecified type  R13.10 Ambulatory referral to [place]  2. Primary osteoarthritis of both knees  M17.0   3. Generalized weakness  R53.1   4. Depression, major, single episode, in partial remission ([place])  F32.4    Meds ordered this encounter  Medications  .  DISCONTD: lidocaine (LIDODERM) 5 %    Sig: Place 1 patch onto the skin daily. Remove & Discard patch within 12 hours or as directed by MD    Dispense:  30 patch    Refill:  5  . mirtazapine (REMERON) 7.5 MG tablet    Sig: Take 1 tablet (7.5 mg total) by mouth at bedtime.    Dispense:  30 tablet    Refill:  5  . lidocaine (LIDODERM) 5 %    Sig: Place 1 patch onto the skin daily. For leg or knee pain. Remove & Discard patch within 12 hours or as directed by MD    Dispense:  30 patch    Refill:  5    Return precautions advised.  Garret Reddish, MD

## 2020-11-22 NOTE — Patient Instructions (Signed)
Depression screen [facility] 2/9 08/14/2020 06/06/2020 01/02/2020  Decreased Interest 2 1 3   Down, Depressed, Hopeless 3 1 3   PHQ - 2 Score 5 2 6   Altered sleeping 3 0 0  Tired, decreased energy 3 0 1  Change in appetite 1 0 0  Feeling bad or failure about yourself  1 0 0  Trouble concentrating 0 0 0  Moving slowly or fidgety/restless 0 0 0  Suicidal thoughts 1 0 1  PHQ-9 Score 14 2 8   Difficult doing work/chores Very difficult - Somewhat difficult  Some recent data might be hidden

## 2020-11-23 ENCOUNTER — Telehealth: Payer: Self-pay

## 2020-11-23 NOTE — Telephone Encounter (Signed)
Jenny Reichmann is calling in from [employer] about an order they received for wheelchair, they are needing office notes and wheelchair narrative. Fax number is [phone]

## 2020-11-26 ENCOUNTER — Encounter: Payer: Self-pay | Admitting: Family Medicine

## 2020-11-26 ENCOUNTER — Telehealth (INDEPENDENT_AMBULATORY_CARE_PROVIDER_SITE_OTHER): Payer: Medicare Other | Admitting: Family Medicine

## 2020-11-26 DIAGNOSIS — R1310 Dysphagia, unspecified: Secondary | ICD-10-CM

## 2020-11-26 DIAGNOSIS — M170 Bilateral primary osteoarthritis of knee: Secondary | ICD-10-CM | POA: Diagnosis not present

## 2020-11-26 DIAGNOSIS — I712 Thoracic aortic aneurysm, without rupture: Secondary | ICD-10-CM | POA: Diagnosis not present

## 2020-11-26 DIAGNOSIS — F324 Major depressive disorder, single episode, in partial remission: Secondary | ICD-10-CM | POA: Diagnosis not present

## 2020-11-26 DIAGNOSIS — J438 Other emphysema: Secondary | ICD-10-CM | POA: Diagnosis not present

## 2020-11-26 DIAGNOSIS — M6281 Muscle weakness (generalized): Secondary | ICD-10-CM | POA: Diagnosis not present

## 2020-11-26 DIAGNOSIS — K219 Gastro-esophageal reflux disease without esophagitis: Secondary | ICD-10-CM | POA: Diagnosis not present

## 2020-11-26 DIAGNOSIS — N183 Chronic kidney disease, stage 3 unspecified: Secondary | ICD-10-CM | POA: Diagnosis not present

## 2020-11-26 DIAGNOSIS — R531 Weakness: Secondary | ICD-10-CM

## 2020-11-26 DIAGNOSIS — R600 Localized edema: Secondary | ICD-10-CM | POA: Diagnosis not present

## 2020-11-26 DIAGNOSIS — I872 Venous insufficiency (chronic) (peripheral): Secondary | ICD-10-CM | POA: Diagnosis not present

## 2020-11-26 DIAGNOSIS — I129 Hypertensive chronic kidney disease with stage 1 through stage 4 chronic kidney disease, or unspecified chronic kidney disease: Secondary | ICD-10-CM | POA: Diagnosis not present

## 2020-11-26 MED ORDER — LIDOCAINE 5 % EX PTCH: 1.0000 | MEDICATED_PATCH | CUTANEOUS | 5 refills | Status: AC

## 2020-11-26 MED ORDER — MIRTAZAPINE 7.5 MG PO TABS: 7.5000 mg | ORAL_TABLET | Freq: Every day | ORAL | 5 refills | Status: DC

## 2020-11-26 MED ORDER — LIDOCAINE 5 % EX PTCH: 1.0000 | MEDICATED_PATCH | CUTANEOUS | 5 refills | Status: DC

## 2020-11-26 NOTE — Telephone Encounter (Signed)
FYI, pt sees you today. So the wheelchair narritave and notes can be faxed after todays visit with him.

## 2020-11-26 NOTE — Telephone Encounter (Signed)
Roger [name] can you fax over once note is closed please? Thanks!

## 2020-11-27 ENCOUNTER — Telehealth: Payer: Self-pay

## 2020-11-27 DIAGNOSIS — M6281 Muscle weakness (generalized): Secondary | ICD-10-CM | POA: Diagnosis not present

## 2020-11-27 NOTE — Telephone Encounter (Signed)
Notes printed and faxed to [facility].

## 2020-11-27 NOTE — Telephone Encounter (Signed)
Dusti from [facility] is requesting a call in regards to pt.'s hard seat wheelchair   [phone]- Dusti

## 2020-11-27 NOTE — Telephone Encounter (Signed)
Called and spoke with [facility] and she wanted to let us know that the hard seat wheelchair is on back order for 8 weeks, she can get him the regular wheel chair and then place the hardseat in once it arrives, I told Dusti that would be fine.

## 2020-11-30 ENCOUNTER — Telehealth: Payer: Self-pay

## 2020-11-30 DIAGNOSIS — J438 Other emphysema: Secondary | ICD-10-CM | POA: Diagnosis not present

## 2020-11-30 DIAGNOSIS — M6281 Muscle weakness (generalized): Secondary | ICD-10-CM | POA: Diagnosis not present

## 2020-11-30 DIAGNOSIS — R1310 Dysphagia, unspecified: Secondary | ICD-10-CM | POA: Diagnosis not present

## 2020-11-30 DIAGNOSIS — R600 Localized edema: Secondary | ICD-10-CM | POA: Diagnosis not present

## 2020-11-30 DIAGNOSIS — I712 Thoracic aortic aneurysm, without rupture: Secondary | ICD-10-CM | POA: Diagnosis not present

## 2020-11-30 DIAGNOSIS — I129 Hypertensive chronic kidney disease with stage 1 through stage 4 chronic kidney disease, or unspecified chronic kidney disease: Secondary | ICD-10-CM | POA: Diagnosis not present

## 2020-11-30 DIAGNOSIS — N183 Chronic kidney disease, stage 3 unspecified: Secondary | ICD-10-CM | POA: Diagnosis not present

## 2020-11-30 DIAGNOSIS — K219 Gastro-esophageal reflux disease without esophagitis: Secondary | ICD-10-CM | POA: Diagnosis not present

## 2020-11-30 DIAGNOSIS — I872 Venous insufficiency (chronic) (peripheral): Secondary | ICD-10-CM | POA: Diagnosis not present

## 2020-11-30 NOTE — Telephone Encounter (Signed)
Please advise 

## 2020-11-30 NOTE — Telephone Encounter (Signed)
Speech Therapist with [facility] relayed the following    Recommend Modified Barium swallow test  Downgrade diet to moist mechanical soft textures  Wants to see pt for swallow therapy for 1x a week for 1 month  Notifying Dr. Yong Channel that pt.'s Voice is hoarse and gerd like symptoms   [phone]. Ok to leave voicemail.

## 2020-11-30 NOTE — Telephone Encounter (Signed)
I am ok with changes  Is he still taking his pantoprazole as instructed twice a day for potential gerd?

## 2020-12-03 DIAGNOSIS — J438 Other emphysema: Secondary | ICD-10-CM | POA: Diagnosis not present

## 2020-12-03 DIAGNOSIS — I712 Thoracic aortic aneurysm, without rupture: Secondary | ICD-10-CM | POA: Diagnosis not present

## 2020-12-03 DIAGNOSIS — I129 Hypertensive chronic kidney disease with stage 1 through stage 4 chronic kidney disease, or unspecified chronic kidney disease: Secondary | ICD-10-CM | POA: Diagnosis not present

## 2020-12-03 DIAGNOSIS — R600 Localized edema: Secondary | ICD-10-CM | POA: Diagnosis not present

## 2020-12-03 DIAGNOSIS — K219 Gastro-esophageal reflux disease without esophagitis: Secondary | ICD-10-CM | POA: Diagnosis not present

## 2020-12-03 DIAGNOSIS — I872 Venous insufficiency (chronic) (peripheral): Secondary | ICD-10-CM | POA: Diagnosis not present

## 2020-12-03 DIAGNOSIS — R1310 Dysphagia, unspecified: Secondary | ICD-10-CM | POA: Diagnosis not present

## 2020-12-03 DIAGNOSIS — M6281 Muscle weakness (generalized): Secondary | ICD-10-CM | POA: Diagnosis not present

## 2020-12-03 DIAGNOSIS — N183 Chronic kidney disease, stage 3 unspecified: Secondary | ICD-10-CM | POA: Diagnosis not present

## 2020-12-03 NOTE — Telephone Encounter (Signed)
Patient had this done on 05/29/2020-has there been a significant change to warrant another modified swallow study-if not can we send a copy to them from October 2021 under imaging  Usually they will except verbal orders for home health and then send [place] the forms if he is already established.  I am pretty sure he is already established.  You may give verbal orders or write on prescription pad and stamp my signature under dysphagia

## 2020-12-03 NOTE — Telephone Encounter (Signed)
Patient is taking pantoprazole as instructed. Speech therapist stated may need Dr.Hunter to place a referral for the swallow therapy will give [place] a call back if needed.

## 2020-12-03 NOTE — Telephone Encounter (Signed)
Please clarify- for the modified barium swallow study they need order?

## 2020-12-03 NOTE — Telephone Encounter (Signed)
Called and left message for speech therapist to return call.

## 2020-12-03 NOTE — Telephone Encounter (Signed)
Speech therapist says she needs an order placed for both the swallow therapy to have done once a week for the next month and the barium swallow study.

## 2020-12-04 NOTE — Telephone Encounter (Signed)
Unable to reach the speech therapist. [facility]

## 2020-12-05 ENCOUNTER — Telehealth: Payer: Self-pay

## 2020-12-05 DIAGNOSIS — I712 Thoracic aortic aneurysm, without rupture: Secondary | ICD-10-CM | POA: Diagnosis not present

## 2020-12-05 DIAGNOSIS — M6281 Muscle weakness (generalized): Secondary | ICD-10-CM | POA: Diagnosis not present

## 2020-12-05 DIAGNOSIS — I129 Hypertensive chronic kidney disease with stage 1 through stage 4 chronic kidney disease, or unspecified chronic kidney disease: Secondary | ICD-10-CM | POA: Diagnosis not present

## 2020-12-05 DIAGNOSIS — J438 Other emphysema: Secondary | ICD-10-CM | POA: Diagnosis not present

## 2020-12-05 DIAGNOSIS — N183 Chronic kidney disease, stage 3 unspecified: Secondary | ICD-10-CM | POA: Diagnosis not present

## 2020-12-05 DIAGNOSIS — R1310 Dysphagia, unspecified: Secondary | ICD-10-CM | POA: Diagnosis not present

## 2020-12-05 DIAGNOSIS — I872 Venous insufficiency (chronic) (peripheral): Secondary | ICD-10-CM | POA: Diagnosis not present

## 2020-12-05 DIAGNOSIS — R600 Localized edema: Secondary | ICD-10-CM | POA: Diagnosis not present

## 2020-12-05 DIAGNOSIS — K219 Gastro-esophageal reflux disease without esophagitis: Secondary | ICD-10-CM | POA: Diagnosis not present

## 2020-12-05 NOTE — Telephone Encounter (Signed)
Roger [name] is calling in from [facility], they are needing a fax [phone]) that states that they are able to do the Barium Swallow at [facility]. Requested a call once it is faxed so Roger [name] can schedule it with the patient.

## 2020-12-06 NOTE — Telephone Encounter (Signed)
Order faxed.

## 2020-12-06 NOTE — Telephone Encounter (Signed)
Order has been faxed and [name] made aware.

## 2020-12-10 ENCOUNTER — Telehealth: Payer: Self-pay

## 2020-12-10 DIAGNOSIS — J438 Other emphysema: Secondary | ICD-10-CM | POA: Diagnosis not present

## 2020-12-10 DIAGNOSIS — I872 Venous insufficiency (chronic) (peripheral): Secondary | ICD-10-CM | POA: Diagnosis not present

## 2020-12-10 DIAGNOSIS — I712 Thoracic aortic aneurysm, without rupture: Secondary | ICD-10-CM | POA: Diagnosis not present

## 2020-12-10 DIAGNOSIS — R1310 Dysphagia, unspecified: Secondary | ICD-10-CM | POA: Diagnosis not present

## 2020-12-10 DIAGNOSIS — N183 Chronic kidney disease, stage 3 unspecified: Secondary | ICD-10-CM | POA: Diagnosis not present

## 2020-12-10 DIAGNOSIS — K219 Gastro-esophageal reflux disease without esophagitis: Secondary | ICD-10-CM | POA: Diagnosis not present

## 2020-12-10 DIAGNOSIS — R600 Localized edema: Secondary | ICD-10-CM | POA: Diagnosis not present

## 2020-12-10 DIAGNOSIS — I129 Hypertensive chronic kidney disease with stage 1 through stage 4 chronic kidney disease, or unspecified chronic kidney disease: Secondary | ICD-10-CM | POA: Diagnosis not present

## 2020-12-10 DIAGNOSIS — M6281 Muscle weakness (generalized): Secondary | ICD-10-CM | POA: Diagnosis not present

## 2020-12-10 NOTE — Telephone Encounter (Signed)
Please clarify which order is needed- speech therapy alone? I am fine with that order under dysphagia

## 2020-12-10 NOTE — Telephone Encounter (Signed)
This was from last week-need an update on symptoms

## 2020-12-10 NOTE — Telephone Encounter (Signed)
Please advise 

## 2020-12-10 NOTE — Telephone Encounter (Signed)
Please advise this and I will place the referral if it is okay with you

## 2020-12-10 NOTE — Telephone Encounter (Signed)
Nurse Assessment Nurse: Gareth Eagle, RN, Raquel Sarna Date/Time Eilene Ghazi Time): 12/07/2020 12:41:07 PM Confirm and document reason for call. If symptomatic, describe symptoms. ---Caller states resident has been having loose stools after breakfast, at least 4 stools; wondering if RX can be sent. No vomiting or fever. Does the patient have any new or worsening symptoms? ---Yes Will a triage be completed? ---Yes Related visit to physician within the last 2 weeks? ---Yes Does the PT have any chronic conditions? (i.e. diabetes, asthma, this includes High risk factors for pregnancy, etc.) ---Yes List chronic conditions. ---depression, hypothyroidism, hyperlipidemia, overactive bladder, aneurysm Is this a behavioral health or substance abuse call? ---No Guidelines Guideline Title Affirmed Question Affirmed Notes Nurse Date/Time (Eastern Time) Diarrhea [1] MODERATE diarrhea (e.g., 4-6 times / day more than Theodoro Grist 12/07/2020 12:42:51 PM PLEASE NOTE: All timestamps contained within this report are represented as [place] Standard Time. CONFIDENTIALTY NOTICE: This fax transmission is intended only for the addressee. It contains information that is legally privileged, confidential or otherwise protected from use or disclosure. If you are not the intended recipient, you are strictly prohibited from reviewing, disclosing, copying using or disseminating any of this information or taking any action in reliance on or regarding this information. If you have received this fax in error, please notify us immediately by telephone so that we can arrange for its return to [place]. Phone: [phone], Toll-Free: [phone], Fax: [phone] Page: 2 of 2 Call Id: [id number] Guidelines Guideline Title Affirmed Question Affirmed Notes Nurse Date/Time Eilene Ghazi Time) normal) AND [2] age > 42 years 20. Time Eilene Ghazi Time) Disposition Final User 12/07/2020 1:08:45 PM See PCP within 24 Hours Yes Gareth Eagle, RN,  Raquel Sarna Caller Disagree/Comply Comply Caller Understands Yes PreDisposition InappropriateToAsk Care Advice Given Per Guideline SEE PCP WITHIN 24 HOURS: * IF OFFICE WILL BE CLOSED: You need to be seen within the next 24 hours. A clinic or an urgent care center is often a good source of care if your doctor's office is closed or you can't get an appointment. FLUID THERAPY DURING SEVERE DIARRHEA: * Drink more fluids, at least 8 to 10 cups daily. One cup equals 8 oz (240 ml). * WATER: For mild to moderate diarrhea, water is often the best liquid to drink. You should also eat some salty foods (e.g., potato chips, pretzels, saltine crackers). This is important to make sure you are getting enough salt, sugars, and fluids to meet your body's needs. CALL BACK IF: * Signs of dehydration occur (e.g., no urine over 12 hours, very dry mouth, lightheaded, etc.) * Bloody stools * You become worse CARE ADVICE given per Diarrhea (Adult) guideline. Comments User: Tilden Fossa, RN Date/Time (Eastern Time): 12/07/2020 1:08:33 PM Caller states she is a Web designer and cannot take verbal orders; requires faxed order for any meds. Informed her that office is closed for holiday and we have no way to send fax. She states that their RN can take verbal orders but she isn't sure if she will be back today. Advised her that if RN comes in today, have her contact us, at which point, we may be able to contact the on-call MD for a possible RX order. If RN does not come in, advised that they may call back in AM when [facility] is open, or may take resident in to be seen for a visit within 24hr per triage outcome. Referrals GO TO FACILITY UNDECIDED

## 2020-12-10 NOTE — Telephone Encounter (Signed)
Called and spoke with patients daughter and was given the assisted living phone number to get an update on patient. Unable to speak with [place] per daughter, left voicemail to return call. [phone])

## 2020-12-10 NOTE — Telephone Encounter (Signed)
[name] from [facility] called and said that Roger [name] needed a referral sent over for Speech Therapy stated they never received it. Once referral is sent put in the notes to call the daughter to schedule the appt.

## 2020-12-11 DIAGNOSIS — I712 Thoracic aortic aneurysm, without rupture: Secondary | ICD-10-CM | POA: Diagnosis not present

## 2020-12-11 DIAGNOSIS — J438 Other emphysema: Secondary | ICD-10-CM | POA: Diagnosis not present

## 2020-12-11 DIAGNOSIS — R1310 Dysphagia, unspecified: Secondary | ICD-10-CM | POA: Diagnosis not present

## 2020-12-11 DIAGNOSIS — I129 Hypertensive chronic kidney disease with stage 1 through stage 4 chronic kidney disease, or unspecified chronic kidney disease: Secondary | ICD-10-CM | POA: Diagnosis not present

## 2020-12-11 DIAGNOSIS — K219 Gastro-esophageal reflux disease without esophagitis: Secondary | ICD-10-CM | POA: Diagnosis not present

## 2020-12-11 DIAGNOSIS — N183 Chronic kidney disease, stage 3 unspecified: Secondary | ICD-10-CM | POA: Diagnosis not present

## 2020-12-11 DIAGNOSIS — R600 Localized edema: Secondary | ICD-10-CM | POA: Diagnosis not present

## 2020-12-11 DIAGNOSIS — I872 Venous insufficiency (chronic) (peripheral): Secondary | ICD-10-CM | POA: Diagnosis not present

## 2020-12-11 DIAGNOSIS — M6281 Muscle weakness (generalized): Secondary | ICD-10-CM | POA: Diagnosis not present

## 2020-12-11 NOTE — Telephone Encounter (Signed)
Called and lm for [employer].

## 2020-12-14 ENCOUNTER — Telehealth: Payer: Self-pay

## 2020-12-14 NOTE — Telephone Encounter (Signed)
Yes may downgrade diet to soft foods-make sure specifically diet lines up with speech and language pathology recommendations

## 2020-12-14 NOTE — Telephone Encounter (Signed)
Roger [name] will fax over what is needed since I am not in office today.

## 2020-12-14 NOTE — Telephone Encounter (Signed)
Roger [name] at [employer] [facility]  Needs orders faxed over so patient to down grade his dit to soft foods. Fax number [phone].

## 2020-12-14 NOTE — Telephone Encounter (Signed)
error 

## 2020-12-14 NOTE — Telephone Encounter (Signed)
Script has been written and faxed to [facility]. Dr Yong Channel comments is written on the front of the fax transmittal sheet.

## 2020-12-14 NOTE — Telephone Encounter (Signed)
Called to speak with vanessa AGAIN unable to reach her, unable to leave a VM. I am confused o what paperwork or orders she needs for the patient.

## 2020-12-14 NOTE — Telephone Encounter (Signed)
Ok to send order. 

## 2020-12-14 NOTE — Telephone Encounter (Signed)
Roger [name] called back and stated [facility] still hasnt received the order fax number [phone].

## 2020-12-17 ENCOUNTER — Encounter: Payer: Self-pay | Admitting: Family Medicine

## 2020-12-17 DIAGNOSIS — R1310 Dysphagia, unspecified: Secondary | ICD-10-CM | POA: Diagnosis not present

## 2020-12-17 DIAGNOSIS — I129 Hypertensive chronic kidney disease with stage 1 through stage 4 chronic kidney disease, or unspecified chronic kidney disease: Secondary | ICD-10-CM | POA: Diagnosis not present

## 2020-12-17 DIAGNOSIS — I712 Thoracic aortic aneurysm, without rupture: Secondary | ICD-10-CM | POA: Diagnosis not present

## 2020-12-17 DIAGNOSIS — N183 Chronic kidney disease, stage 3 unspecified: Secondary | ICD-10-CM | POA: Diagnosis not present

## 2020-12-17 DIAGNOSIS — J438 Other emphysema: Secondary | ICD-10-CM | POA: Diagnosis not present

## 2020-12-17 DIAGNOSIS — K219 Gastro-esophageal reflux disease without esophagitis: Secondary | ICD-10-CM | POA: Diagnosis not present

## 2020-12-17 DIAGNOSIS — M6281 Muscle weakness (generalized): Secondary | ICD-10-CM | POA: Diagnosis not present

## 2020-12-17 DIAGNOSIS — I872 Venous insufficiency (chronic) (peripheral): Secondary | ICD-10-CM | POA: Diagnosis not present

## 2020-12-17 DIAGNOSIS — R600 Localized edema: Secondary | ICD-10-CM | POA: Diagnosis not present

## 2020-12-18 ENCOUNTER — Telehealth: Payer: Self-pay

## 2020-12-18 ENCOUNTER — Other Ambulatory Visit: Payer: Self-pay | Admitting: Family Medicine

## 2020-12-18 NOTE — Telephone Encounter (Signed)
Can you refax please.

## 2020-12-18 NOTE — Telephone Encounter (Signed)
[facility] is calling stating they faxed over an order on 4/23 for Roger [name]'s patch to be PRN per daughters request. Asking for an update.

## 2020-12-24 DIAGNOSIS — I872 Venous insufficiency (chronic) (peripheral): Secondary | ICD-10-CM | POA: Diagnosis not present

## 2020-12-24 DIAGNOSIS — R1310 Dysphagia, unspecified: Secondary | ICD-10-CM | POA: Diagnosis not present

## 2020-12-24 DIAGNOSIS — K219 Gastro-esophageal reflux disease without esophagitis: Secondary | ICD-10-CM | POA: Diagnosis not present

## 2020-12-24 DIAGNOSIS — R600 Localized edema: Secondary | ICD-10-CM | POA: Diagnosis not present

## 2020-12-24 DIAGNOSIS — M6281 Muscle weakness (generalized): Secondary | ICD-10-CM | POA: Diagnosis not present

## 2020-12-24 DIAGNOSIS — J438 Other emphysema: Secondary | ICD-10-CM | POA: Diagnosis not present

## 2020-12-24 DIAGNOSIS — N183 Chronic kidney disease, stage 3 unspecified: Secondary | ICD-10-CM | POA: Diagnosis not present

## 2020-12-24 DIAGNOSIS — I129 Hypertensive chronic kidney disease with stage 1 through stage 4 chronic kidney disease, or unspecified chronic kidney disease: Secondary | ICD-10-CM | POA: Diagnosis not present

## 2020-12-24 DIAGNOSIS — I712 Thoracic aortic aneurysm, without rupture: Secondary | ICD-10-CM | POA: Diagnosis not present

## 2020-12-25 DIAGNOSIS — I129 Hypertensive chronic kidney disease with stage 1 through stage 4 chronic kidney disease, or unspecified chronic kidney disease: Secondary | ICD-10-CM | POA: Diagnosis not present

## 2020-12-25 DIAGNOSIS — M6281 Muscle weakness (generalized): Secondary | ICD-10-CM | POA: Diagnosis not present

## 2020-12-25 DIAGNOSIS — J438 Other emphysema: Secondary | ICD-10-CM | POA: Diagnosis not present

## 2020-12-25 DIAGNOSIS — I712 Thoracic aortic aneurysm, without rupture: Secondary | ICD-10-CM | POA: Diagnosis not present

## 2020-12-25 DIAGNOSIS — R1310 Dysphagia, unspecified: Secondary | ICD-10-CM | POA: Diagnosis not present

## 2020-12-25 DIAGNOSIS — I872 Venous insufficiency (chronic) (peripheral): Secondary | ICD-10-CM | POA: Diagnosis not present

## 2020-12-25 DIAGNOSIS — K219 Gastro-esophageal reflux disease without esophagitis: Secondary | ICD-10-CM | POA: Diagnosis not present

## 2020-12-25 DIAGNOSIS — N183 Chronic kidney disease, stage 3 unspecified: Secondary | ICD-10-CM | POA: Diagnosis not present

## 2020-12-25 DIAGNOSIS — R600 Localized edema: Secondary | ICD-10-CM | POA: Diagnosis not present

## 2020-12-27 DIAGNOSIS — M6281 Muscle weakness (generalized): Secondary | ICD-10-CM | POA: Diagnosis not present

## 2020-12-31 ENCOUNTER — Telehealth: Payer: Self-pay

## 2020-12-31 DIAGNOSIS — R1310 Dysphagia, unspecified: Secondary | ICD-10-CM | POA: Diagnosis not present

## 2020-12-31 DIAGNOSIS — N183 Chronic kidney disease, stage 3 unspecified: Secondary | ICD-10-CM | POA: Diagnosis not present

## 2020-12-31 DIAGNOSIS — J438 Other emphysema: Secondary | ICD-10-CM | POA: Diagnosis not present

## 2020-12-31 DIAGNOSIS — I712 Thoracic aortic aneurysm, without rupture: Secondary | ICD-10-CM | POA: Diagnosis not present

## 2020-12-31 DIAGNOSIS — I129 Hypertensive chronic kidney disease with stage 1 through stage 4 chronic kidney disease, or unspecified chronic kidney disease: Secondary | ICD-10-CM | POA: Diagnosis not present

## 2020-12-31 DIAGNOSIS — R600 Localized edema: Secondary | ICD-10-CM | POA: Diagnosis not present

## 2020-12-31 DIAGNOSIS — I872 Venous insufficiency (chronic) (peripheral): Secondary | ICD-10-CM | POA: Diagnosis not present

## 2020-12-31 DIAGNOSIS — K219 Gastro-esophageal reflux disease without esophagitis: Secondary | ICD-10-CM | POA: Diagnosis not present

## 2020-12-31 DIAGNOSIS — M6281 Muscle weakness (generalized): Secondary | ICD-10-CM | POA: Diagnosis not present

## 2020-12-31 NOTE — Telephone Encounter (Signed)
Noted  

## 2020-12-31 NOTE — Telephone Encounter (Signed)
Home Health Certification or Plan of Care Tracking  This is a Certification and [place] Agency: [facility]    Order Number:  [id number]  Where has form been placed:  Folder up front

## 2020-12-31 NOTE — Progress Notes (Signed)
    Chronic Care Management Pharmacy Assistant   Name: Roger [name]  MRN: [id number] DOB: 05-03-1920  Reason for Encounter: General Adherence Disease State Call   Recent office visits:  12/17/20- Dr Garret Reddish (PCP)- telephone note Decrease Buspar 5mg  from twice daily to once daily as needed.  Changed Lidocaine patches to prn.  12/10/20- Dr Garret Reddish (PCP)- telephone note Referral for speech.  12/05/20 Dr Garret Reddish (PCP)- telephone note Referral for Barium swallow study 11/26/20-Dr Garret Reddish (PCP)- Video visit- seen for leg pain and depression. Started LIDOCAINE 5% patches 1 patch transdermal every 24 hours for leg or knee pain.  Started Mirtazapine 7.5 mg take 1 daily at bedtime.  Referral to home health. Diet recommendation for soft moist foods.  Follow up in 4-6 weeks.  10/03/20- Dr Garret Reddish (PCP)- telephone note Started Buspirone 5mg  up to bid prn anxiety. Noted Discontinued Pristiq due to affordability.   Recent consult visits:  None noted  Hospital visits:  None in previous 6 months  Medications: Outpatient Encounter Medications as of 12/31/2020  Medication Sig  . busPIRone (BUSPAR) 5 MG tablet Take 1 tablet (5 mg total) by mouth 2 (two) times daily as needed (Anxiety/agitation).  . furosemide (LASIX) 20 MG tablet TAKE 1 TABLET BY MOUTH  DAILY AS NEEDED FOR WEIGHT  GAIN OR INCREASED LEG  SWELLING  . INCRUSE ELLIPTA 62.5 MCG/INH AEPB TAKE 1 PUFF BY MOUTH EVERY DAY  . lidocaine (LIDODERM) 5 % Place 1 patch onto the skin daily. For leg or knee pain. Remove & Discard patch within 12 hours or as directed by MD  . lovastatin (MEVACOR) 40 MG tablet TAKE 1 TABLET BY MOUTH  DAILY  . mirtazapine (REMERON) 7.5 MG tablet TAKE 1 TABLET BY MOUTH AT BEDTIME.  . Multiple Vitamins-Minerals (MULTIVITAMIN WITH MINERALS) tablet Take 1 tablet by mouth daily.  . mupirocin ointment (BACTROBAN) 2 % Apply 1 application topically 2 (two) times daily. (Patient not taking: Reported  on 11/26/2020)  . MYRBETRIQ 50 MG TB24 tablet Take 50 mg by mouth daily.  . pantoprazole (PROTONIX) 40 MG tablet TAKE 1 TABLET BY MOUTH 30  TO 60 MINUTES BEFORE YOUR  FIRST AND LAST MEALS OF THE DAY  . UNABLE TO FIND Med Name: Med pass 120 mL by mouth twice daily for supplement  . [DISCONTINUED] diltiazem (CARDIZEM CD) 180 MG 24 hr capsule Take 180 mg by mouth daily.   . [DISCONTINUED] hydrochlorothiazide (MICROZIDE) 12.5 MG capsule Take 1 capsule (12.5 mg total) by mouth daily.   No facility-administered encounter medications on file as of 12/31/2020.   Spoke with [name], pt's daughter. Mr. Couper is currently in assisted living and is doing well. He was recently prescribed new medication which she says he is doing well with. [name] stated Mr. Alferd' mood has improved significantly and he is smiling more. She stated he is back to his regular self. [name] receives Mr. Gagandeep' medications through [facility] and brings it to the assisted living facility. She stated Mr. Kairon is now in the donut hole for both Ellipta and Myrbetriq. She stated his Myrbetriq will now cost $500 for a 3 month supply vs. The $130 co-pay they are used to. They are working to find alternatives, but would like any assistance that can be provided by CPP. Besides this, [name] has nothing to report besides that her father is doing better than he was.   Star Rating Drugs:   Lovastatin 40mg  -[facility]  [employer], CMA

## 2021-01-01 ENCOUNTER — Telehealth: Payer: Self-pay | Admitting: Internal Medicine

## 2021-01-01 NOTE — Telephone Encounter (Signed)
I have called Roger [name] and she stated that the pt is doing so much better on the Incruse and I advised her that even if we change the medication to something else, he is still going to have to pay out of pocket for the medication and it may even be more expensive.  Roger [name] understands and stated that they will just do what they have to do to keep him on the medication.  Nothing further is needed

## 2021-01-07 DIAGNOSIS — M6281 Muscle weakness (generalized): Secondary | ICD-10-CM | POA: Diagnosis not present

## 2021-01-07 DIAGNOSIS — N183 Chronic kidney disease, stage 3 unspecified: Secondary | ICD-10-CM | POA: Diagnosis not present

## 2021-01-07 DIAGNOSIS — I129 Hypertensive chronic kidney disease with stage 1 through stage 4 chronic kidney disease, or unspecified chronic kidney disease: Secondary | ICD-10-CM | POA: Diagnosis not present

## 2021-01-07 DIAGNOSIS — R1310 Dysphagia, unspecified: Secondary | ICD-10-CM | POA: Diagnosis not present

## 2021-01-07 DIAGNOSIS — J438 Other emphysema: Secondary | ICD-10-CM | POA: Diagnosis not present

## 2021-01-07 DIAGNOSIS — R600 Localized edema: Secondary | ICD-10-CM | POA: Diagnosis not present

## 2021-01-07 DIAGNOSIS — K219 Gastro-esophageal reflux disease without esophagitis: Secondary | ICD-10-CM | POA: Diagnosis not present

## 2021-01-07 DIAGNOSIS — I872 Venous insufficiency (chronic) (peripheral): Secondary | ICD-10-CM | POA: Diagnosis not present

## 2021-01-07 DIAGNOSIS — I712 Thoracic aortic aneurysm, without rupture: Secondary | ICD-10-CM | POA: Diagnosis not present

## 2021-01-07 NOTE — Telephone Encounter (Cosign Needed)
Spoke with patient's daughter, [name]. Mr. Kuster will be receiving samples of Myrbetriq and she is willing to pay for Ellipta. They will not need an appointment to further discuss patient assistance.

## 2021-01-11 DIAGNOSIS — I712 Thoracic aortic aneurysm, without rupture: Secondary | ICD-10-CM | POA: Diagnosis not present

## 2021-01-11 DIAGNOSIS — M6281 Muscle weakness (generalized): Secondary | ICD-10-CM | POA: Diagnosis not present

## 2021-01-11 DIAGNOSIS — R1310 Dysphagia, unspecified: Secondary | ICD-10-CM | POA: Diagnosis not present

## 2021-01-11 DIAGNOSIS — I872 Venous insufficiency (chronic) (peripheral): Secondary | ICD-10-CM | POA: Diagnosis not present

## 2021-01-11 DIAGNOSIS — R600 Localized edema: Secondary | ICD-10-CM | POA: Diagnosis not present

## 2021-01-11 DIAGNOSIS — I129 Hypertensive chronic kidney disease with stage 1 through stage 4 chronic kidney disease, or unspecified chronic kidney disease: Secondary | ICD-10-CM | POA: Diagnosis not present

## 2021-01-11 DIAGNOSIS — J438 Other emphysema: Secondary | ICD-10-CM | POA: Diagnosis not present

## 2021-01-11 DIAGNOSIS — N183 Chronic kidney disease, stage 3 unspecified: Secondary | ICD-10-CM | POA: Diagnosis not present

## 2021-01-11 DIAGNOSIS — K219 Gastro-esophageal reflux disease without esophagitis: Secondary | ICD-10-CM | POA: Diagnosis not present

## 2021-01-14 DIAGNOSIS — N183 Chronic kidney disease, stage 3 unspecified: Secondary | ICD-10-CM | POA: Diagnosis not present

## 2021-01-14 DIAGNOSIS — I129 Hypertensive chronic kidney disease with stage 1 through stage 4 chronic kidney disease, or unspecified chronic kidney disease: Secondary | ICD-10-CM | POA: Diagnosis not present

## 2021-01-14 DIAGNOSIS — J438 Other emphysema: Secondary | ICD-10-CM | POA: Diagnosis not present

## 2021-01-14 DIAGNOSIS — I712 Thoracic aortic aneurysm, without rupture: Secondary | ICD-10-CM | POA: Diagnosis not present

## 2021-01-14 DIAGNOSIS — R1310 Dysphagia, unspecified: Secondary | ICD-10-CM | POA: Diagnosis not present

## 2021-01-14 DIAGNOSIS — M6281 Muscle weakness (generalized): Secondary | ICD-10-CM | POA: Diagnosis not present

## 2021-01-14 DIAGNOSIS — K219 Gastro-esophageal reflux disease without esophagitis: Secondary | ICD-10-CM | POA: Diagnosis not present

## 2021-01-14 DIAGNOSIS — I872 Venous insufficiency (chronic) (peripheral): Secondary | ICD-10-CM | POA: Diagnosis not present

## 2021-01-14 DIAGNOSIS — R600 Localized edema: Secondary | ICD-10-CM | POA: Diagnosis not present

## 2021-01-15 ENCOUNTER — Telehealth: Payer: Self-pay

## 2021-01-15 ENCOUNTER — Telehealth: Payer: Medicare Other | Admitting: Family Medicine

## 2021-01-15 DIAGNOSIS — Z743 Need for continuous supervision: Secondary | ICD-10-CM | POA: Diagnosis not present

## 2021-01-15 DIAGNOSIS — I469 Cardiac arrest, cause unspecified: Secondary | ICD-10-CM | POA: Diagnosis not present

## 2021-01-15 DIAGNOSIS — R4020 Unspecified coma: Secondary | ICD-10-CM | POA: Diagnosis not present

## 2021-01-15 DIAGNOSIS — R404 Transient alteration of awareness: Secondary | ICD-10-CM | POA: Diagnosis not present

## 2021-01-15 NOTE — Telephone Encounter (Signed)
Spoke with daughter-she is understandably heartbroken after losing both of her parents recently.  She knows we are available if she needs [place]

## 2021-01-15 NOTE — Telephone Encounter (Signed)
Noted  

## 2021-01-15 NOTE — Telephone Encounter (Signed)
See below

## 2021-01-15 NOTE — Telephone Encounter (Signed)
Scheduled pt for 4:40 this afternoon virtually.   Pts daughter called asking if Dr. Yong Channel could send over orders through epic for hospice, a nebulizer treatment, and x-ray to check for aspiration due to pt not eating or swallowing well. Please advise.

## 2021-01-15 NOTE — Telephone Encounter (Signed)
Patients daughter called and stated the patient was not eating nor get off the bed since his wife passing. Set the patient up for a virtual visit with Dr.Hunter for 01/19/21 . Roger [name] wanted to know if he could be seen sooner since he is not eating.

## 2021-01-15 NOTE — Telephone Encounter (Signed)
Pt daughter called stating pt just passed away. Death certificate will be sent over to sign.

## 2021-01-15 NOTE — Telephone Encounter (Signed)
FYI, questions for visit below.

## 2021-01-15 NOTE — Telephone Encounter (Signed)
4 40 today or 11 40 tomorrow potentially- I will likely be running behind but these are only sooner spots I see potentially

## 2021-01-18 ENCOUNTER — Telehealth: Payer: Medicare Other | Admitting: Family Medicine

## 2021-01-18 ENCOUNTER — Telehealth: Payer: Self-pay

## 2021-01-18 NOTE — Telephone Encounter (Signed)
Received a call from [facility], the death certificate has been uploaded into [facility] system for [place] to sign.

## 2021-01-19 NOTE — Telephone Encounter (Signed)
I completed the death certificate- please let them know and if any issues let us know

## 2021-01-23 DIAGNOSIS — 419620001 Death: Secondary | SNOMED CT | POA: Diagnosis not present

## 2021-01-23 DEATH — deceased

## 2021-04-09 ENCOUNTER — Telehealth: Payer: Self-pay | Admitting: Pharmacist

## 2021-04-09 NOTE — Chronic Care Management (AMB) (Addendum)
    Chronic Care Management Pharmacy Assistant   Name: SASCHA BONDER  MRN: [id number] DOB: Apr 07, 1920  Reason for Encounter: General Adherence Call    Recent office visits:  None  Recent consult visits:  None  Hospital visits:  None in previous 6 months  Medications: Outpatient Encounter Medications as of 04/09/2021  Medication Sig   busPIRone (BUSPAR) 5 MG tablet Take 1 tablet (5 mg total) by mouth 2 (two) times daily as needed (Anxiety/agitation).   furosemide (LASIX) 20 MG tablet TAKE 1 TABLET BY MOUTH  DAILY AS NEEDED FOR WEIGHT  GAIN OR INCREASED LEG  SWELLING   INCRUSE ELLIPTA 62.5 MCG/INH AEPB TAKE 1 PUFF BY MOUTH EVERY DAY   lidocaine (LIDODERM) 5 % Place 1 patch onto the skin daily. For leg or knee pain. Remove & Discard patch within 12 hours or as directed by MD   lovastatin (MEVACOR) 40 MG tablet TAKE 1 TABLET BY MOUTH  DAILY   mirtazapine (REMERON) 7.5 MG tablet TAKE 1 TABLET BY MOUTH AT BEDTIME.   Multiple Vitamins-Minerals (MULTIVITAMIN WITH MINERALS) tablet Take 1 tablet by mouth daily.   mupirocin ointment (BACTROBAN) 2 % Apply 1 application topically 2 (two) times daily. (Patient not taking: Reported on 11/26/2020)   MYRBETRIQ 50 MG TB24 tablet Take 50 mg by mouth daily.   pantoprazole (PROTONIX) 40 MG tablet TAKE 1 TABLET BY MOUTH 30  TO 60 MINUTES BEFORE YOUR  FIRST AND LAST MEALS OF THE DAY   UNABLE TO FIND Med Name: Med pass 120 mL by mouth twice daily for supplement   [DISCONTINUED] diltiazem (CARDIZEM CD) 180 MG 24 hr capsule Take 180 mg by mouth daily.    [DISCONTINUED] hydrochlorothiazide (MICROZIDE) 12.5 MG capsule Take 1 capsule (12.5 mg total) by mouth daily.   No facility-administered encounter medications on file as of 04/09/2021.   Patient Questions: Have you had any problems recently with your health?  Have you had any problems with your pharmacy?  What issues or side effects are you having with your medications?  What would you like me to pass  along to Leata Mouse, CPP for him to help you with?   What can we do to take care of you better?  No future appointments.   Star Rating Drugs: Lovastatin last filled 10/24/2020 90 DS  **I spoke with [name], the patient's daughter. Unfortunately she reports that the patient has passed away.   April D Calhoun, [facility] Pharmacist Assistant [phone]

## 2022-05-19 ENCOUNTER — Encounter: Payer: Self-pay | Admitting: *Deleted
